# Patient Record
Sex: Female | Born: 1935
Health system: Southern US, Community
[De-identification: ages and names within clinical notes are randomized; demographics above are authoritative.]

## PROBLEM LIST (undated history)

## (undated) DIAGNOSIS — F0390 Unspecified dementia without behavioral disturbance: Secondary | ICD-10-CM

## (undated) DIAGNOSIS — T8859XA Other complications of anesthesia, initial encounter: Secondary | ICD-10-CM

## (undated) DIAGNOSIS — E7800 Pure hypercholesterolemia, unspecified: Secondary | ICD-10-CM

## (undated) DIAGNOSIS — R0609 Other forms of dyspnea: Secondary | ICD-10-CM

## (undated) DIAGNOSIS — N200 Calculus of kidney: Secondary | ICD-10-CM

## (undated) DIAGNOSIS — R011 Cardiac murmur, unspecified: Secondary | ICD-10-CM

## (undated) DIAGNOSIS — C319 Malignant neoplasm of accessory sinus, unspecified: Secondary | ICD-10-CM

## (undated) DIAGNOSIS — R413 Other amnesia: Secondary | ICD-10-CM

## (undated) DIAGNOSIS — I639 Cerebral infarction, unspecified: Secondary | ICD-10-CM

## (undated) DIAGNOSIS — T4145XA Adverse effect of unspecified anesthetic, initial encounter: Secondary | ICD-10-CM

## (undated) DIAGNOSIS — R0600 Dyspnea, unspecified: Secondary | ICD-10-CM

## (undated) DIAGNOSIS — G2581 Restless legs syndrome: Secondary | ICD-10-CM

## (undated) DIAGNOSIS — M1990 Unspecified osteoarthritis, unspecified site: Secondary | ICD-10-CM

## (undated) DIAGNOSIS — I10 Essential (primary) hypertension: Secondary | ICD-10-CM

## (undated) DIAGNOSIS — M797 Fibromyalgia: Secondary | ICD-10-CM

## (undated) DIAGNOSIS — I209 Angina pectoris, unspecified: Secondary | ICD-10-CM

## (undated) DIAGNOSIS — G459 Transient cerebral ischemic attack, unspecified: Secondary | ICD-10-CM

## (undated) DIAGNOSIS — K219 Gastro-esophageal reflux disease without esophagitis: Secondary | ICD-10-CM

## (undated) DIAGNOSIS — R079 Chest pain, unspecified: Secondary | ICD-10-CM

## (undated) DIAGNOSIS — F419 Anxiety disorder, unspecified: Secondary | ICD-10-CM

## (undated) DIAGNOSIS — I739 Peripheral vascular disease, unspecified: Secondary | ICD-10-CM

## (undated) HISTORY — PX: VAGINAL HYSTERECTOMY: SUR661

## (undated) HISTORY — PX: LUNG SURGERY: SHX703

## (undated) HISTORY — PX: CATARACT EXTRACTION W/ INTRAOCULAR LENS  IMPLANT, BILATERAL: SHX1307

## (undated) HISTORY — PX: BREAST CYST EXCISION: SHX579

## (undated) HISTORY — PX: KNEE CARTILAGE SURGERY: SHX688

## (undated) HISTORY — PX: CARPAL TUNNEL RELEASE: SHX101

## (undated) HISTORY — DX: Peripheral vascular disease, unspecified: I73.9

## (undated) HISTORY — PX: LUMBAR SPINE SURGERY: SHX701

## (undated) HISTORY — PX: KIDNEY STONE SURGERY: SHX686

## (undated) HISTORY — DX: Chest pain, unspecified: R07.9

## (undated) HISTORY — DX: Essential (primary) hypertension: I10

## (undated) HISTORY — PX: ELBOW BURSA SURGERY: SHX615

## (undated) NOTE — *Deleted (*Deleted)
Patient resting in bed without signs of distress throughout the day.

---
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
## 1997-10-27 ENCOUNTER — Other Ambulatory Visit: Admission: RE | Admit: 1997-10-27 | Discharge: 1997-10-27 | Payer: Self-pay | Admitting: Obstetrics and Gynecology

## 1998-11-17 ENCOUNTER — Other Ambulatory Visit: Admission: RE | Admit: 1998-11-17 | Discharge: 1998-11-17 | Payer: Self-pay | Admitting: Orthopedic Surgery

## 1998-12-05 ENCOUNTER — Encounter: Payer: Self-pay | Admitting: Emergency Medicine

## 1998-12-05 ENCOUNTER — Emergency Department (HOSPITAL_COMMUNITY): Admission: EM | Admit: 1998-12-05 | Discharge: 1998-12-05 | Payer: Self-pay | Admitting: Emergency Medicine

## 1998-12-16 ENCOUNTER — Other Ambulatory Visit: Admission: RE | Admit: 1998-12-16 | Discharge: 1998-12-16 | Payer: Self-pay | Admitting: Obstetrics and Gynecology

## 1999-08-18 ENCOUNTER — Emergency Department (HOSPITAL_COMMUNITY): Admission: EM | Admit: 1999-08-18 | Discharge: 1999-08-18 | Payer: Self-pay

## 1999-08-18 ENCOUNTER — Encounter: Payer: Self-pay | Admitting: Emergency Medicine

## 1999-11-05 ENCOUNTER — Ambulatory Visit (HOSPITAL_COMMUNITY): Admission: RE | Admit: 1999-11-05 | Discharge: 1999-11-05 | Payer: Self-pay | Admitting: Gastroenterology

## 1999-11-05 ENCOUNTER — Encounter: Payer: Self-pay | Admitting: Gastroenterology

## 2000-03-06 ENCOUNTER — Other Ambulatory Visit: Admission: RE | Admit: 2000-03-06 | Discharge: 2000-03-06 | Payer: Self-pay | Admitting: Obstetrics and Gynecology

## 2001-01-12 ENCOUNTER — Encounter: Admission: RE | Admit: 2001-01-12 | Discharge: 2001-01-12 | Payer: Self-pay | Admitting: Obstetrics and Gynecology

## 2001-01-12 ENCOUNTER — Encounter: Payer: Self-pay | Admitting: Obstetrics and Gynecology

## 2001-01-17 ENCOUNTER — Other Ambulatory Visit: Admission: RE | Admit: 2001-01-17 | Discharge: 2001-01-17 | Payer: Self-pay | Admitting: Obstetrics and Gynecology

## 2001-08-17 ENCOUNTER — Encounter: Payer: Self-pay | Admitting: Otolaryngology

## 2001-08-17 ENCOUNTER — Encounter: Admission: RE | Admit: 2001-08-17 | Discharge: 2001-08-17 | Payer: Self-pay | Admitting: Otolaryngology

## 2001-08-27 ENCOUNTER — Encounter (INDEPENDENT_AMBULATORY_CARE_PROVIDER_SITE_OTHER): Payer: Self-pay

## 2001-08-28 ENCOUNTER — Ambulatory Visit: Admission: RE | Admit: 2001-08-28 | Discharge: 2001-11-26 | Payer: Self-pay | Admitting: Radiation Oncology

## 2001-08-30 ENCOUNTER — Ambulatory Visit (HOSPITAL_COMMUNITY): Admission: RE | Admit: 2001-08-30 | Discharge: 2001-08-30 | Payer: Self-pay | Admitting: Oncology

## 2001-08-30 ENCOUNTER — Encounter: Payer: Self-pay | Admitting: Oncology

## 2001-08-31 ENCOUNTER — Ambulatory Visit (HOSPITAL_COMMUNITY): Admission: RE | Admit: 2001-08-31 | Discharge: 2001-08-31 | Payer: Self-pay | Admitting: Oncology

## 2001-08-31 ENCOUNTER — Encounter: Payer: Self-pay | Admitting: Oncology

## 2001-09-30 ENCOUNTER — Encounter: Payer: Self-pay | Admitting: Emergency Medicine

## 2001-10-01 ENCOUNTER — Inpatient Hospital Stay (HOSPITAL_COMMUNITY): Admission: EM | Admit: 2001-10-01 | Discharge: 2001-10-04 | Payer: Self-pay | Admitting: Emergency Medicine

## 2001-10-01 ENCOUNTER — Encounter: Payer: Self-pay | Admitting: Internal Medicine

## 2001-10-19 ENCOUNTER — Encounter: Payer: Self-pay | Admitting: Oncology

## 2001-10-19 ENCOUNTER — Inpatient Hospital Stay (HOSPITAL_COMMUNITY): Admission: EM | Admit: 2001-10-19 | Discharge: 2001-10-24 | Payer: Self-pay | Admitting: Oncology

## 2001-10-19 ENCOUNTER — Ambulatory Visit (HOSPITAL_COMMUNITY): Admission: RE | Admit: 2001-10-19 | Discharge: 2001-10-19 | Payer: Self-pay | Admitting: Oncology

## 2001-12-07 ENCOUNTER — Ambulatory Visit: Admission: RE | Admit: 2001-12-07 | Discharge: 2002-02-11 | Payer: Self-pay | Admitting: Radiation Oncology

## 2001-12-13 ENCOUNTER — Encounter (HOSPITAL_COMMUNITY): Admission: RE | Admit: 2001-12-13 | Discharge: 2002-03-13 | Payer: Self-pay | Admitting: Dentistry

## 2001-12-13 ENCOUNTER — Encounter (HOSPITAL_COMMUNITY): Payer: Self-pay | Admitting: Dentistry

## 2001-12-28 ENCOUNTER — Ambulatory Visit: Admission: RE | Admit: 2001-12-28 | Discharge: 2001-12-28 | Payer: Self-pay | Admitting: Radiation Oncology

## 2002-03-26 ENCOUNTER — Ambulatory Visit: Admission: RE | Admit: 2002-03-26 | Discharge: 2002-03-26 | Payer: Self-pay | Admitting: Radiation Oncology

## 2002-04-02 ENCOUNTER — Ambulatory Visit: Admission: RE | Admit: 2002-04-02 | Discharge: 2002-04-02 | Payer: Self-pay | Admitting: Radiation Oncology

## 2002-04-15 ENCOUNTER — Ambulatory Visit (HOSPITAL_COMMUNITY): Admission: RE | Admit: 2002-04-15 | Discharge: 2002-04-15 | Payer: Self-pay | Admitting: Oncology

## 2002-04-15 ENCOUNTER — Encounter: Payer: Self-pay | Admitting: Oncology

## 2002-08-13 ENCOUNTER — Encounter: Payer: Self-pay | Admitting: Oncology

## 2002-08-13 ENCOUNTER — Ambulatory Visit (HOSPITAL_COMMUNITY): Admission: RE | Admit: 2002-08-13 | Discharge: 2002-08-13 | Payer: Self-pay | Admitting: Oncology

## 2002-12-11 ENCOUNTER — Ambulatory Visit (HOSPITAL_COMMUNITY): Admission: RE | Admit: 2002-12-11 | Discharge: 2002-12-11 | Payer: Self-pay | Admitting: Oncology

## 2002-12-11 ENCOUNTER — Encounter: Payer: Self-pay | Admitting: Oncology

## 2003-02-27 ENCOUNTER — Emergency Department (HOSPITAL_COMMUNITY): Admission: EM | Admit: 2003-02-27 | Discharge: 2003-02-27 | Payer: Self-pay | Admitting: Emergency Medicine

## 2003-03-22 HISTORY — PX: CHOLECYSTECTOMY: SHX55

## 2003-05-12 ENCOUNTER — Ambulatory Visit (HOSPITAL_COMMUNITY): Admission: RE | Admit: 2003-05-12 | Discharge: 2003-05-12 | Payer: Self-pay | Admitting: Oncology

## 2003-06-11 ENCOUNTER — Other Ambulatory Visit: Admission: RE | Admit: 2003-06-11 | Discharge: 2003-06-11 | Payer: Self-pay | Admitting: Gynecology

## 2003-08-18 ENCOUNTER — Observation Stay (HOSPITAL_COMMUNITY): Admission: EM | Admit: 2003-08-18 | Discharge: 2003-08-19 | Payer: Self-pay | Admitting: Emergency Medicine

## 2003-09-08 ENCOUNTER — Ambulatory Visit (HOSPITAL_COMMUNITY): Admission: RE | Admit: 2003-09-08 | Discharge: 2003-09-08 | Payer: Self-pay | Admitting: Oncology

## 2003-09-09 ENCOUNTER — Ambulatory Visit (HOSPITAL_COMMUNITY): Admission: RE | Admit: 2003-09-09 | Discharge: 2003-09-10 | Payer: Self-pay | Admitting: General Surgery

## 2003-09-09 ENCOUNTER — Encounter (INDEPENDENT_AMBULATORY_CARE_PROVIDER_SITE_OTHER): Payer: Self-pay | Admitting: *Deleted

## 2004-03-31 ENCOUNTER — Ambulatory Visit: Payer: Self-pay | Admitting: Oncology

## 2004-04-02 ENCOUNTER — Ambulatory Visit (HOSPITAL_COMMUNITY): Admission: RE | Admit: 2004-04-02 | Discharge: 2004-04-02 | Payer: Self-pay | Admitting: Oncology

## 2004-09-29 ENCOUNTER — Ambulatory Visit: Payer: Self-pay | Admitting: Oncology

## 2005-03-21 HISTORY — PX: HERNIA REPAIR: SHX51

## 2005-07-19 ENCOUNTER — Ambulatory Visit (HOSPITAL_COMMUNITY): Admission: RE | Admit: 2005-07-19 | Discharge: 2005-07-20 | Payer: Self-pay | Admitting: General Surgery

## 2005-08-29 ENCOUNTER — Encounter: Admission: RE | Admit: 2005-08-29 | Discharge: 2005-08-29 | Payer: Self-pay | Admitting: Family Medicine

## 2005-09-16 ENCOUNTER — Encounter: Admission: RE | Admit: 2005-09-16 | Discharge: 2005-09-16 | Payer: Self-pay | Admitting: Orthopedic Surgery

## 2005-09-28 ENCOUNTER — Encounter: Admission: RE | Admit: 2005-09-28 | Discharge: 2005-09-28 | Payer: Self-pay | Admitting: Orthopedic Surgery

## 2005-10-10 ENCOUNTER — Encounter: Admission: RE | Admit: 2005-10-10 | Discharge: 2005-10-10 | Payer: Self-pay | Admitting: Orthopedic Surgery

## 2005-12-30 ENCOUNTER — Encounter: Admission: RE | Admit: 2005-12-30 | Discharge: 2005-12-30 | Payer: Self-pay | Admitting: Orthopedic Surgery

## 2006-01-11 ENCOUNTER — Emergency Department (HOSPITAL_COMMUNITY): Admission: EM | Admit: 2006-01-11 | Discharge: 2006-01-11 | Payer: Self-pay | Admitting: Family Medicine

## 2006-01-27 ENCOUNTER — Ambulatory Visit (HOSPITAL_COMMUNITY): Admission: RE | Admit: 2006-01-27 | Discharge: 2006-01-27 | Payer: Self-pay | Admitting: Neurosurgery

## 2006-02-20 ENCOUNTER — Inpatient Hospital Stay (HOSPITAL_COMMUNITY): Admission: RE | Admit: 2006-02-20 | Discharge: 2006-03-01 | Payer: Self-pay | Admitting: Neurosurgery

## 2006-02-28 ENCOUNTER — Ambulatory Visit: Payer: Self-pay | Admitting: Physical Medicine & Rehabilitation

## 2006-06-07 ENCOUNTER — Emergency Department (HOSPITAL_COMMUNITY): Admission: EM | Admit: 2006-06-07 | Discharge: 2006-06-07 | Payer: Self-pay | Admitting: Family Medicine

## 2006-06-13 ENCOUNTER — Encounter: Admission: RE | Admit: 2006-06-13 | Discharge: 2006-06-13 | Payer: Self-pay | Admitting: Orthopedic Surgery

## 2006-09-04 ENCOUNTER — Ambulatory Visit: Payer: Self-pay | Admitting: Oncology

## 2006-09-05 LAB — CBC WITH DIFFERENTIAL/PLATELET
Basophils Absolute: 0 10*3/uL (ref 0.0–0.1)
Eosinophils Absolute: 0.2 10*3/uL (ref 0.0–0.5)
HCT: 33.4 % — ABNORMAL LOW (ref 34.8–46.6)
HGB: 11.6 g/dL (ref 11.6–15.9)
MCV: 90.1 fL (ref 81.0–101.0)
MONO%: 8.6 % (ref 0.0–13.0)
NEUT#: 4.5 10*3/uL (ref 1.5–6.5)
RDW: 13.7 % (ref 11.3–14.5)
lymph#: 1.5 10*3/uL (ref 0.9–3.3)

## 2006-09-06 LAB — LACTATE DEHYDROGENASE: LDH: 157 U/L (ref 94–250)

## 2006-09-06 LAB — COMPREHENSIVE METABOLIC PANEL
ALT: 11 U/L (ref 0–35)
CO2: 27 mEq/L (ref 19–32)
Calcium: 9.6 mg/dL (ref 8.4–10.5)
Chloride: 99 mEq/L (ref 96–112)
Sodium: 137 mEq/L (ref 135–145)
Total Protein: 7.4 g/dL (ref 6.0–8.3)

## 2006-09-06 LAB — BETA 2 MICROGLOBULIN, SERUM: Beta-2 Microglobulin: 1.89 mg/L — ABNORMAL HIGH (ref 1.01–1.73)

## 2006-09-08 ENCOUNTER — Ambulatory Visit (HOSPITAL_COMMUNITY): Admission: RE | Admit: 2006-09-08 | Discharge: 2006-09-08 | Payer: Self-pay | Admitting: Oncology

## 2006-09-29 LAB — PROTEIN ELECTROPHORESIS, SERUM: Gamma Globulin: 14.9 % (ref 11.1–18.8)

## 2006-12-10 ENCOUNTER — Emergency Department (HOSPITAL_COMMUNITY): Admission: EM | Admit: 2006-12-10 | Discharge: 2006-12-10 | Payer: Self-pay | Admitting: Emergency Medicine

## 2006-12-19 ENCOUNTER — Encounter: Admission: RE | Admit: 2006-12-19 | Discharge: 2007-01-12 | Payer: Self-pay | Admitting: Orthopedic Surgery

## 2007-02-06 ENCOUNTER — Other Ambulatory Visit: Admission: RE | Admit: 2007-02-06 | Discharge: 2007-02-06 | Payer: Self-pay | Admitting: Gynecology

## 2007-04-16 ENCOUNTER — Emergency Department (HOSPITAL_COMMUNITY): Admission: EM | Admit: 2007-04-16 | Discharge: 2007-04-16 | Payer: Self-pay | Admitting: Family Medicine

## 2007-09-14 ENCOUNTER — Ambulatory Visit: Payer: Self-pay | Admitting: Oncology

## 2007-10-19 LAB — CBC WITH DIFFERENTIAL/PLATELET
BASO%: 0.3 % (ref 0.0–2.0)
Basophils Absolute: 0 10*3/uL (ref 0.0–0.1)
EOS%: 0 % (ref 0.0–7.0)
HCT: 36.2 % (ref 34.8–46.6)
HGB: 12.6 g/dL (ref 11.6–15.9)
LYMPH%: 12.5 % — ABNORMAL LOW (ref 14.0–48.0)
MCH: 32.2 pg (ref 26.0–34.0)
MCHC: 34.7 g/dL (ref 32.0–36.0)
MCV: 92.8 fL (ref 81.0–101.0)
MONO%: 0.5 % (ref 0.0–13.0)
NEUT%: 86.7 % — ABNORMAL HIGH (ref 39.6–76.8)

## 2007-10-22 ENCOUNTER — Ambulatory Visit (HOSPITAL_COMMUNITY): Admission: RE | Admit: 2007-10-22 | Discharge: 2007-10-22 | Payer: Self-pay | Admitting: Oncology

## 2007-10-22 ENCOUNTER — Encounter: Admission: RE | Admit: 2007-10-22 | Discharge: 2007-10-22 | Payer: Self-pay | Admitting: Oncology

## 2007-10-23 LAB — PROTEIN ELECTROPHORESIS, SERUM
Albumin ELP: 59 % (ref 55.8–66.1)
Alpha-1-Globulin: 3.9 % (ref 2.9–4.9)
Beta Globulin: 6 % (ref 4.7–7.2)
Total Protein, Serum Electrophoresis: 8.1 g/dL (ref 6.0–8.3)

## 2007-10-23 LAB — COMPREHENSIVE METABOLIC PANEL
Albumin: 4.8 g/dL (ref 3.5–5.2)
Alkaline Phosphatase: 89 U/L (ref 39–117)
BUN: 19 mg/dL (ref 6–23)
Creatinine, Ser: 1.26 mg/dL — ABNORMAL HIGH (ref 0.40–1.20)
Glucose, Bld: 173 mg/dL — ABNORMAL HIGH (ref 70–99)
Potassium: 4.8 mEq/L (ref 3.5–5.3)
Total Bilirubin: 0.5 mg/dL (ref 0.3–1.2)

## 2007-10-24 ENCOUNTER — Ambulatory Visit (HOSPITAL_COMMUNITY): Admission: RE | Admit: 2007-10-24 | Discharge: 2007-10-24 | Payer: Self-pay | Admitting: Oncology

## 2008-11-02 ENCOUNTER — Emergency Department (HOSPITAL_COMMUNITY): Admission: EM | Admit: 2008-11-02 | Discharge: 2008-11-02 | Payer: Self-pay | Admitting: Emergency Medicine

## 2008-12-23 ENCOUNTER — Emergency Department (HOSPITAL_COMMUNITY): Admission: EM | Admit: 2008-12-23 | Discharge: 2008-12-23 | Payer: Self-pay | Admitting: Family Medicine

## 2009-01-28 ENCOUNTER — Emergency Department (HOSPITAL_COMMUNITY): Admission: EM | Admit: 2009-01-28 | Discharge: 2009-01-28 | Payer: Self-pay | Admitting: Family Medicine

## 2009-01-28 ENCOUNTER — Inpatient Hospital Stay (HOSPITAL_COMMUNITY): Admission: EM | Admit: 2009-01-28 | Discharge: 2009-01-30 | Payer: Self-pay | Admitting: Emergency Medicine

## 2009-01-29 ENCOUNTER — Ambulatory Visit: Payer: Self-pay | Admitting: Surgery

## 2009-01-29 ENCOUNTER — Encounter (INDEPENDENT_AMBULATORY_CARE_PROVIDER_SITE_OTHER): Payer: Self-pay | Admitting: Internal Medicine

## 2009-05-11 ENCOUNTER — Encounter: Admission: RE | Admit: 2009-05-11 | Discharge: 2009-05-11 | Payer: Self-pay | Admitting: Internal Medicine

## 2009-06-01 ENCOUNTER — Inpatient Hospital Stay (HOSPITAL_COMMUNITY): Admission: EM | Admit: 2009-06-01 | Discharge: 2009-06-04 | Payer: Self-pay | Admitting: Emergency Medicine

## 2009-06-02 ENCOUNTER — Encounter (INDEPENDENT_AMBULATORY_CARE_PROVIDER_SITE_OTHER): Payer: Self-pay | Admitting: Internal Medicine

## 2009-12-13 ENCOUNTER — Emergency Department (HOSPITAL_COMMUNITY): Admission: EM | Admit: 2009-12-13 | Discharge: 2009-12-13 | Payer: Self-pay | Admitting: Emergency Medicine

## 2009-12-13 ENCOUNTER — Emergency Department (HOSPITAL_COMMUNITY): Admission: EM | Admit: 2009-12-13 | Discharge: 2009-12-13 | Payer: Self-pay | Admitting: Family Medicine

## 2009-12-14 ENCOUNTER — Encounter: Admission: RE | Admit: 2009-12-14 | Discharge: 2009-12-14 | Payer: Self-pay | Admitting: Orthopedic Surgery

## 2010-01-01 ENCOUNTER — Inpatient Hospital Stay (HOSPITAL_COMMUNITY): Admission: EM | Admit: 2010-01-01 | Discharge: 2010-01-03 | Payer: Self-pay | Admitting: Emergency Medicine

## 2010-01-02 ENCOUNTER — Encounter (INDEPENDENT_AMBULATORY_CARE_PROVIDER_SITE_OTHER): Payer: Self-pay | Admitting: Internal Medicine

## 2010-01-02 ENCOUNTER — Ambulatory Visit: Payer: Self-pay | Admitting: Vascular Surgery

## 2010-01-09 ENCOUNTER — Emergency Department (HOSPITAL_COMMUNITY): Admission: EM | Admit: 2010-01-09 | Discharge: 2010-01-09 | Payer: Self-pay | Admitting: Emergency Medicine

## 2010-04-10 ENCOUNTER — Encounter: Payer: Self-pay | Admitting: Oncology

## 2010-04-11 ENCOUNTER — Encounter: Payer: Self-pay | Admitting: Oncology

## 2010-04-12 ENCOUNTER — Encounter: Payer: Self-pay | Admitting: Oncology

## 2010-06-02 LAB — COMPREHENSIVE METABOLIC PANEL
ALT: 78 U/L — ABNORMAL HIGH (ref 0–35)
AST: 48 U/L — ABNORMAL HIGH (ref 0–37)
AST: 90 U/L — ABNORMAL HIGH (ref 0–37)
Albumin: 3.6 g/dL (ref 3.5–5.2)
BUN: 23 mg/dL (ref 6–23)
CO2: 27 mEq/L (ref 19–32)
Calcium: 9.2 mg/dL (ref 8.4–10.5)
Chloride: 101 mEq/L (ref 96–112)
Chloride: 102 mEq/L (ref 96–112)
Creatinine, Ser: 0.92 mg/dL (ref 0.4–1.2)
Creatinine, Ser: 1 mg/dL (ref 0.4–1.2)
GFR calc Af Amer: >60 mL/min (ref >60)
GFR calc Af Amer: >60 mL/min (ref >60)
GFR calc non Af Amer: 54 mL/min — ABNORMAL LOW (ref >60)
Glucose, Bld: 105 mg/dL — ABNORMAL HIGH (ref 70–99)
Sodium: 138 mEq/L (ref 135–145)
Total Bilirubin: 0.5 mg/dL (ref 0.3–1.2)
Total Bilirubin: 0.7 mg/dL (ref 0.3–1.2)
Total Protein: 6.7 g/dL (ref 6.0–8.3)

## 2010-06-02 LAB — URINALYSIS, ROUTINE W REFLEX MICROSCOPIC
Bilirubin Urine: NEGATIVE
Glucose, UA: NEGATIVE mg/dL
Hgb urine dipstick: NEGATIVE
Specific Gravity, Urine: 1.007 (ref 1.005–1.030)
pH: 5.5 (ref 5.0–8.0)

## 2010-06-02 LAB — DIFFERENTIAL
Basophils Absolute: 0.1 10*3/uL (ref 0.0–0.1)
Eosinophils Absolute: 0.1 10*3/uL (ref 0.0–0.7)
Eosinophils Relative: 1 % (ref 0–5)
Neutrophils Relative %: 79 % — ABNORMAL HIGH (ref 43–77)

## 2010-06-02 LAB — CBC
HCT: 35.5 % — ABNORMAL LOW (ref 36.0–46.0)
Hemoglobin: 11.2 g/dL — ABNORMAL LOW (ref 12.0–15.0)
Hemoglobin: 11.7 g/dL — ABNORMAL LOW (ref 12.0–15.0)
MCH: 30.4 pg (ref 26.0–34.0)
MCH: 31.2 pg (ref 26.0–34.0)
MCHC: 33 g/dL (ref 30.0–36.0)
MCV: 92.1 fL (ref 78.0–100.0)
RBC: 3.69 MIL/uL — ABNORMAL LOW (ref 3.87–5.11)
RBC: 3.75 MIL/uL — ABNORMAL LOW (ref 3.87–5.11)
WBC: 8 10*3/uL (ref 4.0–10.5)

## 2010-06-02 LAB — TROPONIN I: Troponin I: 0.01 ng/mL (ref 0.00–0.06)

## 2010-06-02 LAB — CARDIAC PANEL(CRET KIN+CKTOT+MB+TROPI): Relative Index: 1.4 (ref 0.0–2.5)

## 2010-06-02 LAB — URINE CULTURE
Colony Count: NO GROWTH
Culture  Setup Time: 201110141605
Culture: NO GROWTH

## 2010-06-02 LAB — LIPASE, BLOOD: Lipase: 21 U/L (ref 11–59)

## 2010-06-02 LAB — POCT CARDIAC MARKERS
Myoglobin, poc: 116 ng/mL (ref 12–200)
Troponin i, poc: <0.05 ng/mL (ref 0.00–0.09)

## 2010-06-02 LAB — CK TOTAL AND CKMB (NOT AT ARMC): Relative Index: 1.5 (ref 0.0–2.5)

## 2010-06-02 LAB — TSH: TSH: 3.951 u[IU]/mL (ref 0.350–4.500)

## 2010-06-02 LAB — MAGNESIUM: Magnesium: 2.1 mg/dL (ref 1.5–2.5)

## 2010-06-03 LAB — CBC
HCT: 35.9 % — ABNORMAL LOW (ref 36.0–46.0)
MCV: 93.5 fL (ref 78.0–100.0)
RDW: 13.1 % (ref 11.5–15.5)
WBC: 8.7 10*3/uL (ref 4.0–10.5)

## 2010-06-03 LAB — URINALYSIS, ROUTINE W REFLEX MICROSCOPIC
Glucose, UA: NEGATIVE mg/dL
Hgb urine dipstick: NEGATIVE
Ketones, ur: 15 mg/dL — AB
pH: 5.5 (ref 5.0–8.0)

## 2010-06-03 LAB — URINE CULTURE
Colony Count: >=100000
Culture  Setup Time: 201109252256

## 2010-06-03 LAB — URINE MICROSCOPIC-ADD ON

## 2010-06-03 LAB — BASIC METABOLIC PANEL
BUN: 16 mg/dL (ref 6–23)
Chloride: 106 mEq/L (ref 96–112)
Potassium: 4.5 mEq/L (ref 3.5–5.1)

## 2010-06-03 LAB — DIFFERENTIAL
Eosinophils Relative: 4 % (ref 0–5)
Lymphocytes Relative: 18 % (ref 12–46)
Lymphs Abs: 1.6 10*3/uL (ref 0.7–4.0)
Monocytes Absolute: 0.8 10*3/uL (ref 0.1–1.0)

## 2010-06-11 LAB — COMPREHENSIVE METABOLIC PANEL
ALT: 32 U/L (ref 0–35)
Albumin: 3.4 g/dL — ABNORMAL LOW (ref 3.5–5.2)
Alkaline Phosphatase: 69 U/L (ref 39–117)
BUN: 11 mg/dL (ref 6–23)
BUN: 19 mg/dL (ref 6–23)
CO2: 28 mEq/L (ref 19–32)
Chloride: 103 mEq/L (ref 96–112)
Chloride: 103 mEq/L (ref 96–112)
Creatinine, Ser: 0.93 mg/dL (ref 0.4–1.2)
Glucose, Bld: 104 mg/dL — ABNORMAL HIGH (ref 70–99)
Glucose, Bld: 113 mg/dL — ABNORMAL HIGH (ref 70–99)
Potassium: 3.5 mEq/L (ref 3.5–5.1)
Sodium: 139 mEq/L (ref 135–145)
Total Bilirubin: 0.4 mg/dL (ref 0.3–1.2)
Total Bilirubin: 0.6 mg/dL (ref 0.3–1.2)
Total Protein: 6.1 g/dL (ref 6.0–8.3)
Total Protein: 6.3 g/dL (ref 6.0–8.3)

## 2010-06-11 LAB — BASIC METABOLIC PANEL WITH GFR
BUN: 26 mg/dL — ABNORMAL HIGH (ref 6–23)
CO2: 26 meq/L (ref 19–32)
Calcium: 9.3 mg/dL (ref 8.4–10.5)
Chloride: 103 meq/L (ref 96–112)
Creatinine, Ser: 1.23 mg/dL — ABNORMAL HIGH (ref 0.4–1.2)

## 2010-06-11 LAB — BASIC METABOLIC PANEL
GFR calc Af Amer: 51 mL/min — ABNORMAL LOW (ref >60)
GFR calc non Af Amer: 42 mL/min — ABNORMAL LOW (ref >60)
Glucose, Bld: 119 mg/dL — ABNORMAL HIGH (ref 70–99)
Potassium: 4.4 mEq/L (ref 3.5–5.1)
Sodium: 138 mEq/L (ref 135–145)

## 2010-06-11 LAB — DIFFERENTIAL
Basophils Absolute: 0 10*3/uL (ref 0.0–0.1)
Basophils Absolute: 0.1 10*3/uL (ref 0.0–0.1)
Basophils Relative: 0 % (ref 0–1)
Basophils Relative: 1 % (ref 0–1)
Eosinophils Absolute: 0.1 10*3/uL (ref 0.0–0.7)
Eosinophils Relative: 1 % (ref 0–5)
Lymphocytes Relative: 19 % (ref 12–46)
Lymphocytes Relative: 9 % — ABNORMAL LOW (ref 12–46)
Lymphs Abs: 0.8 K/uL (ref 0.7–4.0)
Monocytes Absolute: 0.5 K/uL (ref 0.1–1.0)
Monocytes Absolute: 0.7 10*3/uL (ref 0.1–1.0)
Monocytes Relative: 6 % (ref 3–12)
Neutro Abs: 4.9 10*3/uL (ref 1.7–7.7)
Neutro Abs: 7.8 K/uL — ABNORMAL HIGH (ref 1.7–7.7)
Neutrophils Relative %: 69 % (ref 43–77)
Neutrophils Relative %: 84 % — ABNORMAL HIGH (ref 43–77)

## 2010-06-11 LAB — CBC
HCT: 30.9 % — ABNORMAL LOW (ref 36.0–46.0)
HCT: 31 % — ABNORMAL LOW (ref 36.0–46.0)
HCT: 33.9 % — ABNORMAL LOW (ref 36.0–46.0)
Hemoglobin: 10.8 g/dL — ABNORMAL LOW (ref 12.0–15.0)
Hemoglobin: 10.8 g/dL — ABNORMAL LOW (ref 12.0–15.0)
Hemoglobin: 11.7 g/dL — ABNORMAL LOW (ref 12.0–15.0)
MCHC: 34.6 g/dL (ref 30.0–36.0)
MCV: 94.1 fL (ref 78.0–100.0)
MCV: 94.6 fL (ref 78.0–100.0)
Platelets: 242 10*3/uL (ref 150–400)
Platelets: 273 10*3/uL (ref 150–400)
RBC: 3.28 MIL/uL — ABNORMAL LOW (ref 3.87–5.11)
RBC: 3.59 MIL/uL — ABNORMAL LOW (ref 3.87–5.11)
RDW: 13.4 % (ref 11.5–15.5)
RDW: 13.5 % (ref 11.5–15.5)
RDW: 13.6 % (ref 11.5–15.5)
WBC: 6.6 10*3/uL (ref 4.0–10.5)
WBC: 9.3 K/uL (ref 4.0–10.5)

## 2010-06-11 LAB — URINALYSIS, ROUTINE W REFLEX MICROSCOPIC
Bilirubin Urine: NEGATIVE
Glucose, UA: NEGATIVE mg/dL
Ketones, ur: NEGATIVE mg/dL
Leukocytes, UA: NEGATIVE
Nitrite: NEGATIVE
Protein, ur: NEGATIVE mg/dL
Specific Gravity, Urine: 1.022 (ref 1.005–1.030)
Urobilinogen, UA: 0.2 mg/dL (ref 0.0–1.0)
pH: 5.5 (ref 5.0–8.0)

## 2010-06-11 LAB — URINE MICROSCOPIC-ADD ON

## 2010-06-11 LAB — LIPID PANEL
Cholesterol: 166 mg/dL (ref 0–200)
HDL: 37 mg/dL — ABNORMAL LOW (ref >39)

## 2010-06-11 LAB — HEMOGLOBIN A1C: Mean Plasma Glucose: 120 mg/dL

## 2010-06-11 LAB — POCT CARDIAC MARKERS

## 2010-06-11 LAB — RAPID URINE DRUG SCREEN, HOSP PERFORMED
Amphetamines: NOT DETECTED
Barbiturates: NOT DETECTED
Benzodiazepines: NOT DETECTED
Cocaine: NOT DETECTED
Opiates: POSITIVE — AB
Tetrahydrocannabinol: NOT DETECTED

## 2010-06-11 LAB — VITAMIN B12: Vitamin B-12: 957 pg/mL — ABNORMAL HIGH (ref 211–911)

## 2010-06-11 LAB — TSH: TSH: 1.506 u[IU]/mL (ref 0.350–4.500)

## 2010-06-11 LAB — FOLATE RBC: RBC Folate: 615 ng/mL — ABNORMAL HIGH (ref 180–600)

## 2010-06-23 LAB — COMPREHENSIVE METABOLIC PANEL
AST: 45 U/L — ABNORMAL HIGH (ref 0–37)
Albumin: 3.8 g/dL (ref 3.5–5.2)
Calcium: 9.4 mg/dL (ref 8.4–10.5)
Creatinine, Ser: 0.97 mg/dL (ref 0.4–1.2)
GFR calc Af Amer: >60 mL/min (ref >60)
GFR calc non Af Amer: 56 mL/min — ABNORMAL LOW (ref >60)
Total Protein: 7.3 g/dL (ref 6.0–8.3)

## 2010-06-23 LAB — CBC
MCHC: 34.6 g/dL (ref 30.0–36.0)
MCV: 97.4 fL (ref 78.0–100.0)
MCV: 97.5 fL (ref 78.0–100.0)
Platelets: 277 10*3/uL (ref 150–400)
Platelets: 305 10*3/uL (ref 150–400)
RBC: 3.81 MIL/uL — ABNORMAL LOW (ref 3.87–5.11)
RDW: 12.8 % (ref 11.5–15.5)
WBC: 9.3 10*3/uL (ref 4.0–10.5)

## 2010-06-23 LAB — CULTURE, BLOOD (ROUTINE X 2)

## 2010-06-23 LAB — DIFFERENTIAL
Basophils Relative: 0 % (ref 0–1)
Eosinophils Absolute: 0.2 10*3/uL (ref 0.0–0.7)
Lymphs Abs: 1.4 10*3/uL (ref 0.7–4.0)
Monocytes Relative: 8 % (ref 3–12)
Neutro Abs: 6.9 10*3/uL (ref 1.7–7.7)
Neutrophils Relative %: 75 % (ref 43–77)

## 2010-06-23 LAB — LIPID PANEL
Cholesterol: 280 mg/dL — ABNORMAL HIGH (ref 0–200)
HDL: 42 mg/dL (ref >39)
Total CHOL/HDL Ratio: 6.7 RATIO
Triglycerides: 230 mg/dL — ABNORMAL HIGH (ref <150)

## 2010-06-23 LAB — BASIC METABOLIC PANEL
BUN: 19 mg/dL (ref 6–23)
CO2: 28 mEq/L (ref 19–32)
Chloride: 103 mEq/L (ref 96–112)
Glucose, Bld: 120 mg/dL — ABNORMAL HIGH (ref 70–99)
Potassium: 3.2 mEq/L — ABNORMAL LOW (ref 3.5–5.1)

## 2010-06-23 LAB — TSH: TSH: 3.535 u[IU]/mL (ref 0.350–4.500)

## 2010-06-23 LAB — POCT I-STAT, CHEM 8
Chloride: 102 mEq/L (ref 96–112)
Creatinine, Ser: 0.7 mg/dL (ref 0.4–1.2)
HCT: 38 % (ref 36.0–46.0)
Hemoglobin: 12.9 g/dL (ref 12.0–15.0)
Potassium: 3.8 mEq/L (ref 3.5–5.1)
Sodium: 137 mEq/L (ref 135–145)

## 2010-06-23 LAB — MAGNESIUM: Magnesium: 2 mg/dL (ref 1.5–2.5)

## 2010-06-26 LAB — URINE CULTURE
Colony Count: NO GROWTH
Culture: NO GROWTH

## 2010-06-26 LAB — POCT URINALYSIS DIP (DEVICE)
Ketones, ur: NEGATIVE mg/dL
Protein, ur: NEGATIVE mg/dL
Specific Gravity, Urine: <=1.005 (ref 1.005–1.030)

## 2010-06-26 LAB — BASIC METABOLIC PANEL
BUN: 15 mg/dL (ref 6–23)
Chloride: 99 mEq/L (ref 96–112)
Potassium: 3.8 mEq/L (ref 3.5–5.1)

## 2010-06-26 LAB — CBC
HCT: 38.7 % (ref 36.0–46.0)
MCV: 94.3 fL (ref 78.0–100.0)
Platelets: 308 10*3/uL (ref 150–400)
WBC: 7.7 10*3/uL (ref 4.0–10.5)

## 2010-08-06 NOTE — H&P (Signed)
Amanda Hogan, Amanda Hogan NO.:  [id number]   MEDICAL RECORD NO.:  [id number]          PATIENT TYPE:  INP   LOCATION:  3032                         FACILITY:  [facility]   PHYSICIAN:  Hewitt Shorts, M.D.DATE OF BIRTH:  [birth year]   DATE OF ADMISSION:  02/20/2006  DATE OF DISCHARGE:                              HISTORY & PHYSICAL   HISTORY:  The patient a 77 year old left-handed white female who was  evaluated for low back and left lumbar radicular pain and was found to  have extensive degenerative changes in the lumbar spine.   The patient states she has been having pain in the left side of the low  back radiating to the left buttock, left thigh, and lateral left leg for  the past 8-9 months, aggravated by walking.  She denies any right-sided  lower extremity radicular pain.  She does describe some aching of the  back, but the primary pain is that of radicular pain through the left  lower extremity.   The patient was evaluated with x-x-rays and an MRI scan.  She underwent  a series of three epidural sternum injections.  The first two injections  gave her relief for about four weeks, but the third injection only gave  her relief for a couple of days.   The patient was evaluated with x-rays and an MRI scan and subsequently  with a myelogram and post-myelogram CT scan.  The studies showed, on  MRI, degenerative disk disease  and spondylosis, worst at the L4-L5  level with minimal degenerative changes at other levels.  The L4-L5 has  a broad-based spondylitic disk protrusion with L4-L5 facet arthropathy,  and ligamentum flavum thickening, and canal and lateral recess stenosis.  Myelogram and post-myelogram CT scan show a dynamic degenerative  spondylolisthesis at L4 and L5 which worsens with flexion, reduces with  extension, with multifactorial spinal stenosis at L4-L5, left worse than  right.   I discussed options for the nonsurgical management; however, the  patient  wished to proceed with surgery and is admitted now for an L4-L5 lumbar  laminectomy, facetectomy, microdiskectomy, posterior lumbar interbody  fusion with interbody implants and bone grafts as well as posterolateral  arthrodesis with posterior instrumentation and bone graft.   PAST MEDICAL HISTORY:  Notable for a history of hypertension for 25  years, history of TIA on two occasions about 10 years ago.  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  No  history of myocardial infarction, peptic ulcer disease, or lung disease.  No history of diabetes.   PAST SURGICAL HISTORY:  1. Removal of breasts cysts, 1951.  2. Kidney stone removal 1961.  3. Hysterectomy, 1985.  4. Herniorrhaphy, June 2007.   ALLERGIES:  SHE REPORTS ALLERGIC REACTIONS TO IV CONTRAST AND NOVOCAIN.   MEDICATIONS:  1. Atenolol 50 mg daily.  2. Atacand 16 mg daily.  3. Nexium 30 mg daily.  4. Zoloft 25 mg daily.   FAMILY HISTORY:  Mother died at age 37; she had a stroke.  Father died  at age 47; he had heart  disease.   SOCIAL HISTORY:  The patient is married.  She is retired.  She does not  smoke or drink alcoholic beverages.  She does not describe history of  substance abuse.   REVIEW OF SYSTEMS:  Notable for those described in her history of  present illness and past medical history but a 14-point review of  systems is otherwise unremarkable.   PHYSICAL EXAMINATION:  GENERAL:  The patient is a well-developed, well-  nourished, white female.  VITAL SIGNS:  Her height is 5 foot 3 inches, weight 108 pounds,  temperature 97, pulse 64, blood pressure 207/90, respirations 16.  LUNGS:  Clear to auscultation.  She has symmetrical excursion on  respiration bilaterally.  HEART:  Regular rate and rhythm.  S1-S2 has no murmur.  ABDOMEN:  Soft, nondistended.  Normal bowel sounds present.  EXTREMITIES:  No clubbing, cyanosis, or edema.  MUSCULOSKELETAL:  Exam shows she can flex  to 90 degrees, she extended to  10 degrees.  She has more discomfort in extension.  Straight leg raising  is negative bilaterally.  NEUROLOGIC:  Examination shows 5/5 strength in the lower extremities, in  the iliopsoas, quadriceps, dorsiflexors, abductor longus, plantar  flexors.  Sensation is intact to pinprick of the upper and lower  extremities.  Reflexes are 1/5 in the biceps, brachial radialis,  triceps, 2 in the quadriceps, absent in the gastrocnemius, symmetrical  and equivocal bilaterally.  Toes are downgoing.  He has normal gait and  stance.   IMPRESSION:  The patient with low back and left lumbar radicular pain  with extensive degenerative and spondyltic changes at L4-5 with  significant broad-based disk protrusion resulting  in canal and lateral  recess stenosis.   PLAN:  The patient is admitted for an L4-L5 PLIF and PLA.  We discussed  the nature of the surgery and hospital stay at length as well as  recuperation, need for postoperartive immobilization in a lumbar corset,  and risks including infection, bleeding, possible need for transfusion,  the risk of nerve root dysfunction with pain, weakness, numbness, or  tingling, the risk of CSF leakage and the possible need for further  surgery, the risk of failure of the arthrodesis and the possible need  for further surgery, and anesthetic risks of myocardial infarction,  stroke, pneumonia, and death.  After discussing all this on several  occasions, she would like to go ahead with surgery and was admitted for  such.      Hewitt Shorts, M.D.  Electronically Signed     RWN/MEDQ  D:  02/20/2006  T:  02/20/2006  Job:  [id number]   cc:   Hewitt Shorts, M.D.

## 2010-08-06 NOTE — Op Note (Signed)
[place]  Patient:    Amanda Hogan, Amanda Hogan                      MRN: [id number] Proc. Date: 11/05/99 Adm. Date:  [id number] Disc. Date: [id number] Attending:  Rich Brave CC:         [name], M.D.   Operative Report  PROCEDURE:  Colonoscopy.  INDICATIONS:  Followup of small colonic adenomas removed nine years ago, for colon cancer screening, and polyp surveillance.  FINDINGS:  Sigmoid diverticulosis.  PROCEDURE IN DETAIL:  The nature, purpose, and risks of the procedure were familiar to the patient from prior examination.  She provided written consent. This procedure was performed immediately following her upper endoscopy for which she had received Fentanyl 50 mcg and Versed 5 mg IV without arrhythmias or desaturation.  The Olympus PCF 140-L pediatric video colonoscope was advanced quite easily to the cecum, using some external abdominal compression to facilitate advancement into the base of the cecum.  In the proximal ascending colon, there was some mucosal hemorrhage as might be seen from mucosal trauma from advancement of the scope through a difficult sigmoid region with traction on the colonic mesentery although, in this case, there was no particular difficulty in getting through the sigmoid region. There did not appear to be frank primary inflammation in this region.  The hemorrhage was in a vascular pattern and somewhat lacy intra-articular, and dissimilar to what I have seen periodically in other patients.  I elected not to biopsy it.  The cecum was identified by visualization of the appendiceal orifice and transillumination of the right lower quadrant.  The quality of the prep was excellent, and it was felt that all areas were well seen.  There was moderate sigmoid diverticulosis.  There was no evidence of polyps, cancer, primary colitis (mucosal hemorrhages were present as described above), or discrete vascular  malformations.  No biopsies were obtained.  The patient tolerated the procedure well.  There were no apparent complications.  IMPRESSION: 1.  Sigmoid diverticulosis. 2.  Mucosal hemorrhages in the proximal ascending colon, not felt     to be clinically significant.  PLAN:  Consider followup colonoscopy in five years for routine polyp surveillance. DD:  11/05/99 TD:  11/06/99 Job: [id number]

## 2010-08-06 NOTE — H&P (Signed)
[facility]  Patient:    Amanda Hogan, HUETHER Visit Number: [id number] MRN: [id number]          Service Type: MED Location: 3W [id number] Attending Physician:  Redmond Baseman Dictated by:   Redmond Baseman, M.D. Admit Date:  09/30/2001   CC:         [name], M.D.  [name], M.D.   History and Physical  DATE OF BIRTH:  Aug 18, 1928  The patient is admitted to my service on September 30, 2001, at 11 p.m.  PRIMARY CARE PHYSICIAN:  [name], M.D.  ONCOLOGIST:  [name], M.D.  CHIEF COMPLAINT:  Vomiting and explosive diarrhea.  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 ENT was Dr. Lucky Cowboy.  Biopsy was done, diagnosis lymphoma.  First chemotherapy course was given on September 04, 2001 to September 06, 2001, was okay for a few days, i.e., 3-4 days after chemotherapy.  Started with diarrhea four times at night at that time.  Then vomiting started one week ago, intermittent, and has been worse today with lots of vomiting but also a lot of regurgitation as well as diarrhea increased eight times while in the emergency department.  A little epigastric discomfort, cramping for the past week and a half.  No blood in the stools.  No melena.  No hematemesis.  The stools were a mustard color and watery.  And the vomit was also the same mostly ______ and watery.  She was evaluated in the ED and even though her lab work came back fairly good, it was felt that she was at risk to get volume depleted and dehydrated, and her diarrhea was poorly controlled and protracted vomiting. She was given Phenergan.  Her legs feel irritated, and she cannot keep still. Her legs have had this restless feeling, has been on and off for several years, not been treated.  No  fever.  Diarrhea has been explosive today even hit the walls of the bathroom at home according to the daughter, i.e., even hit the ceiling.  ALLERGIES:  IVP DYE.  CURRENT MEDICATIONS: 1. Chemotherapy, just started recently three weeks ago. 2. Nexium 20 mg q.d. 3. Atenolol 50 mg q.d. 4. Nifedipine not sure but may be 60 mg q.d. 5. Zoloft, dose unsure. 6. Prochlorperazine. 7. Maalox. 8. Valtrex x 3 days ago for blisters, prescription by Dr. Nicholas Lose. 9. Estring.  PAST MEDICAL HISTORY: 1. Lymphoma of the sinuses. 2. Hypertension. 3. History of coronary artery disease with history of angina recently. 4. Past history of pneumonia. 5. History of gallstones. 6. Past history of endometriosis. 7. History of kidney stones. 8. TIAs.  PAST SURGICAL HISTORY: 1. Partial hysterectomy for endometriosis, noncancer reason. 2. Laparotomy for removal of kidney stones. 3. Preventative care colonoscopy 2-3 years ago, performed by Dr. Matthias Hughs, was    negative.  SOCIAL HISTORY:  She is married.  Husband is still alive.   Five children alive.  No alcohol.  She never smoked.  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  FAMILY HISTORY:  Dad died of massive heart attack, had strokes as well, multiple.  Mom died of a heart attack as well as she had strokes.  The patient had four sisters.  One died of ovarian  cancer, one of a heart attack.  The other sister is in poor health.  One brother alive and in poor health.  REVIEW OF SYSTEMS:  CVS:  No angina or chest pain.  RESPIRATORY:  Negative. GI:  As above.  GU:  Has been urinating, has been okay.  CNS:  Intermittent restless legs at night, legs still going.  She has to keep moving.  No weakness in the legs or numbness, just a tingling and restless feeling.  PHYSICAL EXAMINATION:  GENERAL:  White female, fidgety, standing, and moving legs a lot.  No distress.  VITAL SIGNS:  Temperature 97.6, blood pressure 125/74, heart rate  76, respiratory rate 16.  ______ hydration ______ IV fluid, normal filling at 125 cc an hour.  HEENT:  Eyes clear.  Wears glasses.  Ears clear and throat.  Upper denture. Nose question of polypoid mass in the floor and roof of the right nostril.  NECK:  Supple.  NODES:  Negative.  LUNGS:  Clear.  CARDIOVASCULAR:  Regular, no murmurs.  ABDOMEN:  Obese, soft, distended.  Also was normal.  No liver, kidney, spleen felt.  It is nontender.  Several subumbilical midline scars.  PELVIC:  Deferred.  RECTAL:  Deferred.  NEUROLOGIC:  Awake, alert, oriented.  Reflexes 2+ bilaterally.  ______. Sensory to filament intact in lower legs.  EXTREMITIES:  No edema, 2+ dorsal pedis pulses.  LABORATORY DATA:  CBC showed a WBC of 11.3, hemoglobin 11.3, platelets 454, hematocrit 32.7.  CMET showed sodium 133 slightly low, glucose 119 slightly high.  The rest of the CMET was totally normal.  Urinalysis was negative.  Abdominal x-ray series was essentially negative.  ASSESSMENT: 1. Intractable nausea, vomiting, and diarrhea, post first dose of    chemotherapy, more severe today with explosive diarrhea, vomiting, and    regurgitation.  At risk for dehydration and volume depletion. 2. Lymphoma of the sinuses, presently being treated with chemotherapy. 3. Hypertension. 4. Obesity. 5. Restless leg syndrome. 6. Gastroesophageal reflux disease. 7. Coronary artery disease. 8. History of gallstones. 9. History of transient ischemic attacks.  Oncologist on call was informed via emergency department.  The P.A. on call. The case was presented for oncology to evaluate.  ______ request medical oncologist for primary care to do the admission.  PLAN:  1. Will admit, private room, condition stable.  2. Vital signs routinely.  3. Activities with assistance.  3. Bedside commode.   4. Nothing per mouth except for ice chips, lemon sticks, and medications.  5. IV D-5 half-normal saline plus  20 mEq of  potassium chloride at 150 cc/hr.  6. Strict ins and outs, daily weights.  7. A liter of normal saline was already given.  8. Continue nifedipine, atenolol, and Zoloft.  9. Klonopin 0.5 mg q.h.s. p.r.n. restless legs. 10. Phenergan 12.5 mg IV q.4-6h. p.r.n. vomiting and nausea. 11. Labs in the a.m., CBC, CMET. 12. Further adjustment to treatment will be made as necessary. 13. Will also add a proton pump inhibitor IV. Dictated by:   Redmond Baseman, M.D. Attending Physician:  Redmond Baseman DD:  10/01/01 TD:  10/02/01 Job: [id number]

## 2010-08-06 NOTE — Discharge Summary (Signed)
[facility]  Patient:    Amanda Hogan, Amanda Hogan Visit Number: [id number] MRN: [id number]          Service Type: MED Location: 3W [id number] Attending Physician:  Gracelyn Nurse Dictated by:   Gracelyn Nurse, M.D. Admit Date:  09/30/2001 Discharge Date: 10/04/2001   CC:         [name], M.D.  [name], M.D.   Discharge Summary  DISCHARGE DIAGNOSES: 1. Nausea, vomiting, and diarrhea.    a. Felt to be secondary to chemotherapy.    b. Clostridium difficile negative. 2. Sinus lymphoma.    a. Status post one cycle of chemotherapy. 3. Mild dehydration. 4. Gastroesophageal reflux disease. 5. Hypertension. 6. History of angina. 7. History of kidney stones. 8. Genital herpes treated as an outpatient.  DISCHARGE MEDICATIONS: 1. Nexium 20 mg q.d. 2. Atenolol 50 mg q.d. 3. Nifedipine 60 mg q.d. 4. Zoloft 25 mg q.d. 5. Phenergan 25 mg q.4h. p.r.n. 6. Valtrex 100 mg t.i.d. x 3 days.  HISTORY OF PRESENT ILLNESS:  This is a 77 year old white female who just recently underwent her first cycle of chemotherapy for sinus lymphoma.  She started having some nausea and vomiting about one week ago.  Today it was much worse.  She started developing diarrhea.  HOSPITAL COURSE: #1 - NAUSEA, VOMITING, AND DIARRHEA:  The patient was given antiemetics. Stool was tested for Clostridium difficile which was negative.  White blood cells were also negative.  She was given Imodium for the diarrhea.  She was started on a clear liquid diet which she tolerated.  Diet was advanced on hospital day #2, but she had an episode of vomiting, so she was changed back to a full liquid diet, which she tolerated well.  She was hydrated with IV fluids.  On the day of discharge, she was tolerating a soft diet and wanted to go home on outpatient management.  #2 - SINUS LYMPHOMA:  As stated above, she is currently undergoing chemotherapy per [name], M.D.  The second cycle of  chemotherapy was scheduled the time of discharge.  This can be postponed to the following Monday.  #3 - HYPERTENSION:  This was controlled with home medications.  DISCHARGE LABORATORY DATA:  The hemoglobin was 10.8, white blood cell count 5.3, and platelets 411.  DISPOSITION:  The patient is discharged in stable condition.  DIET:  She is advised to continue a soft diet and advance as tolerated and to try to drink plenty of liquids.  FOLLOW-UP:  To return to her primary care doctor or this hospital if she has return of symptoms.  She is also supposed to follow up with [name], M.D., next Monday to continue her chemotherapy. Dictated by:   Gracelyn Nurse, M.D. Attending Physician:  Marcelino Duster D DD:  10/04/01 TD:  10/10/01 Job: [id number]

## 2010-08-06 NOTE — Op Note (Signed)
NAMEMYRAH, STRAWDERMAN NO.:  [id number]   MEDICAL RECORD NO.:  [id number]          PATIENT TYPE:  INP   LOCATION:  3032                         FACILITY:  [facility]   PHYSICIAN:  Hewitt Shorts, M.D.DATE OF BIRTH:  Mar 02, 1929   DATE OF PROCEDURE:  02/20/2006  DATE OF DISCHARGE:                               OPERATIVE REPORT   PREOPERATIVE DIAGNOSES:  L4-5 dynamic degenerative spondylolisthesis,  spondylytic disk herniation, degenerative disk disease with spondylosis.   POSTOPERATIVE DIAGNOSES:  L4-5 dynamic degenerative spondylolisthesis,  spondylytic disk herniation, degenerative disk disease with spondylosis.   PROCEDURES:  Bilateral L4-5 lumbar laminotomy, facetectomy,  microdiskectomy and posterior lumbar interbody fusion with AVS PEEK  interbody implants  with Vitoss with bone marrow aspirate, and bilateral  L4-5 posterolateral arthrodesis with 90D posterior instrumentation and  Vitoss with bone marrow aspirate and Infuse and microdissection.   SURGEON:  Hewitt Shorts, M.D.   ASSISTANT:  Cristi Loron, M.D.   ANESTHESIA:  General endotracheal.   OPERATIVE INDICATIONS:  The patient is a 77 year old woman, who  presented with low back and left lumbar radiculopathy.  She was  evaluated with MRI scan and myelogram, post-myelogram CT scan and x-  rays, and was found to have a dynamic degenerative spondylolisthesis at  L4-5, with associated spondylytic disk herniation, worse on the left  than on the right, with associated underlying degenerative disk disease  and spondylosis.  The decision was made to proceed with decompression  with arthrodesis.   DESCRIPTION OF PROCEDURES:  The patient was brought to the operating  room and placed under general endotracheal anesthesia.  The patient was  turned to a prone position.  The lumbar region was prepped with Betadine  Soap and Solution and draped in sterile fashion.  The midline was  infiltrated with  local anesthetic with epinephrine.  An x-ray was taken  and the L4-5 level identified, and the midline incision made over the L4-  5 level and carried down through the subcutaneous tissue.  Bipolar  cautery and electrocautery were used to maintain hemostasis.  Dissection  was carried down to the lumbar fascia, which was incised bilaterally,  and the paraspinal muscles were dissected off the spinous process and  lamina in subperiosteal fashion.  Another x-ray was taken and the L4-5  intralaminar space was identified, and then dissection was first carried  out laterally, exposing the transverse processes of L4 and L5 and the  intertransverse space.  Each of the facet complexes were arthropathic  and the microscope was draped and brought into the field to provide  additional magnification, illumination and visualization, and a  decompression was performed using microdissection and microsurgical  technique.  Bilateral laminotomy and facetectomy were performed using  the X-Max drill and Kerrison punches, as well as double-action rongeurs.  The ligamentum flavum was carefully removed, and we exposed the thecal  sac and identified the L4 and L5 nerve roots bilaterally.  We then  coagulated and divided epidural veins and exposed the L4-5 annulus.  The  disk herniation was noted and we proceeded with bilateral diskectomy,  incising the  annulus and continuing with microcurets and pituitary  rongeurs.  The osteophyte removal tool was used to remove additional  spondylytic disk protrusion and spurring from the posterior aspects of  the L4 and L5 vertebra, and the disk space was prepared for interbody  arthrodesis.  The cartilaginous endplates were removed using  microcurets, and in the end, good decompression of the thecal sac and  nerve roots was achieved bilaterally, and the disk space was measured  for its height.  We selected an 11-mm implant for the right side and a  12-mm implant for the left  side.  Each of the implants was 8 mm wide and  0 degrees of lordosis.  We then went ahead and probed the L5 pedicles  bilaterally and aspirated 10 cc of bone marrow aspirate.  This was  injected over a 10-cc strip of Vitoss, and then Vitoss was packed into  the AVS PEEK interbody implants and we placed a 12-mm implant on the  left and an 11-mm implant on the right.  Additional Vitoss with bone  marrow aspirate was placed between the implants, as well as lateral to  the implants.  We then went ahead and draped the C-arm fluoroscope and  identified the pedicle entry sites for L4 bilaterally. Each of the  pedicles was probed, and then we aspirated another 5 cc of bone marrow  aspirate and injected another 5-cc strip of Vitoss, and then each of the  pedicles were tapped with a 4.25-mm tap.  We placed 4.75 x 40-mm screws  bilaterally at L5, as well as on the left side at L4.  On the right side  at L4, we were unhappy with the initial screw placement; therefore, we  probed the L4 pedicle.  It was retapped, and we placed a 5.75 x 40-mm  screw, and subsequent imaging showed the screw in good position.  We  then decorticated the transverse processes at L4 and L5 bilaterally and  packed Infuse and additional Vitoss with bone marrow aspirate in the  lateral gutters, and over the transverse processes and in the  intertransverse space.  And then we selected 35-mm prelordosed rods that  were placed in the screwheads and secured with locking caps, which were  tightened against the countertorque.  Once the construct was completed,  we inspected the spinal canal and again confirmed good decompression of  the thecal sac and nerve roots bilaterally, and then we proceeded with  closure.  The wound had been irrigated numerous times through the  procedure with Bacitracin solution.  The deep fascia was closed with  interrupted undyed 1 Vicryl suture.  The subcutaneous layer was closed with interrupted, inverted  2-0 undyed Vicryl suture, and the  subcutaneous and subcuticular were closed with interrupted, inverted 2-0  and 3-0 undyed Vicryl sutures, and the skin edges were approximated with  Dermabond.  The patient tolerated the procedure well.  The estimated  blood loss was 500 cc.  We were able to give back 250 cc of Cell Saver  blood.  Sponge and needle counts were correct.  Following surgery, the  patient was turned back to a supine position, reversed from his  anesthetic, extubated and transferred to the recovery room for further  care.      Hewitt Shorts, M.D.  Electronically Signed     RWN/MEDQ  D:  02/20/2006  T:  02/21/2006  Job:  [id number]   cc:   Cristi Loron, M.D.

## 2010-08-06 NOTE — H&P (Signed)
NAME:  Amanda Hogan, Amanda Hogan                         ACCOUNT NO.:  [id number]   MEDICAL RECORD NO.:  [id number]                   PATIENT TYPE:  INP   LOCATION:  0270                                 FACILITY:  [facility]   PHYSICIAN:  [name], M.D.                   DATE OF BIRTH:  Sep 19, 1928   DATE OF ADMISSION:  10/19/2001  DATE OF DISCHARGE:                                HISTORY & PHYSICAL   CHIEF COMPLAINT:  Fever in the setting of neutropenia.   HISTORY OF PRESENT ILLNESS:  This is a 77 year old female with known  lymphoma of the sinus diagnosed in 6/03.  She had most recently been treated  with Rituxan and CHOP.  Her last treatment was on 10/12/01.  She had called  the office on 7/29 complaining of a hacking cough and was given Tussionex  elixir.  Today she presents to the office just not feeling well and not  looking right, according to her daughter.  She will be admitted for further  evaluation.  Amanda Hogan had been in relatively good health all her life.  She has had sinus congestion for approximately two years prior to her  diagnosis.  She had tried a variety of agents without any relief.  She has  also had some tearing of the right eye.  She has had literally no pain.  She  was referred by Dorna Leitz, M.D., who noted an obstructive lymphocytic  mass extending in the right nasal cavity.  A biopsy was obtained and  revealed a lytic infiltrate of large lymphoid-appearing cells positive for  CD20 and negative for C3 cytokeratin marker and HMV45, etc.  This is  consistent with large-cell B lymphoma.   PAST MEDICAL HISTORY:  1. Hypertension.  2. Depression.  3. GERD.  4. Frequent vaginal yeast infections.  5. Prolapsed bladder.   PAST SURGICAL HISTORY:  1. Lung surgery five to six years ago for possible empyema.  2. History of hysterectomy.  3. History of mini-strokes two to three years ago.  4. History of urethral stone.  5. History of breast cyst, removed.  6. History  of hand/foot operation.  7. There is also a history of bursitis and a lump being removed from her     arm.   SOCIAL HISTORY:  Denies smoking history, and no alcohol consumption.  She  has been married for 52 years and has five children.  One lives in [place],  [place], one lives in [place], three live in [place].  She retired at  77 years of age from [employer].   ALLERGIES:  She is allergic to CONTRAST DYE, which has caused an  anaphylactic-type reaction.  She also has an adverse reaction to NOVOCAINE  causing severe nervousness.   FAMILY HISTORY:  She has one brother and one sister, who are alive and well.  There is a history of depression and  strokes in the family.  Both parents  died of MIs.  All children are alive and well.   REVIEW OF SYSTEMS:  Amanda Hogan states that her GERD symptoms have been  worse over the past two weeks.  She has not had any night sweats since she  has started treatment.  She had not taken her temperature until today but  did have chills yesterday.  Her vision has not changed.  She has had a  nagging cough for the last few nights causing a disruption in her sleep.  She also complains of a sore throat.  She denies any chest pain, nausea, or  vomiting.  She has had slight nausea as well as an episode of diarrhea a few  days ago.   PHYSICAL EXAMINATION:  VITAL SIGNS:  Weight is up four pounds at 176.  Blood pressure is 183/92,  temperature is 100.7, pulse is 109, respirations 120.   GENERAL:  This is a pleasant 77 year old female with alopecia.   HEENT:  Oropharynx:  There are a few minute areas of white plaque.  The  tongue is slightly coated.  Sclerae are anicteric.  The left nasal passage  is clear.  There is slight asymmetry of the right.  Tympanic membranes were  not observed.   LYMPHATIC:  There is some asymmetry of the neck but no discrete nodes found  in the cervical, supraclavicular, axillary, or inguinal region.   CHEST:  Breath  sounds clear to auscultation bilaterally.   CARDIAC:  Regular rate and rhythm, tachycardic.   ABDOMEN:  Obese, no organomegaly appreciated, no inguinal adenopathy.  Again, abdomen is obese and firm.   EXTREMITIES:  There is trace pedal edema.   NEUROLOGIC:  Alert and oriented x3.  Cranial nerves II-XII intact.  Strength  is 5/5.   LABORATORY DATA:  WBC 0.494, ANC is 0.014, hemoglobin is 9.32, hematocrit  26.8, MCV is 95.5, platelets are 34.   IMPRESSION AND PLAN:  Amanda Hogan presents today with fever and  neutropenia, status post CHOP/Rituxan therapy.  Her last Rituxan was on  7/24.  Her last CHOP was on 7/25.  She is now status post seven days post  therapy.  She will be admitted for further evaluation, IV antibiotics, and  supportive care.                                                   [name], M.D.    PR/MEDQ  D:  10/19/2001  T:  10/20/2001  Job:  [id number]

## 2010-08-06 NOTE — Op Note (Signed)
NAME:  Amanda Hogan, Amanda Hogan                         ACCOUNT NO.:  [id number]   MEDICAL RECORD NO.:  [id number]                   PATIENT TYPE:  OIB   LOCATION:  5727                                 FACILITY:  [facility]   PHYSICIAN:  Gita Kudo, M.D.              DATE OF BIRTH:  1928/04/08   DATE OF PROCEDURE:  09/09/2003  DATE OF DISCHARGE:                                 OPERATIVE REPORT   PREOPERATIVE DIAGNOSIS:  Cholecystitis, cholelithiasis.   POSTOPERATIVE DIAGNOSIS:  Cholecystitis, cholelithiasis. Normal  cholangiogram.   OPERATION PERFORMED:  Laparoscopic cholecystectomy with intraoperative  cholangiogram.   SURGEON:  Gita Kudo, M.D.   ASSISTANT:  Sheppard Plumber. Earlene Plater, M.D.   ANESTHESIA:  General endotracheal.   INDICATIONS FOR PROCEDURE:  The patient is a 77 year old female with a  recent severe attack of right upper quadrant abdominal pain.  Ultrasound  showed stone and her liver function study was normal.   OPERATIVE FINDINGS:  The patient's gallbladder was thin-walled and had at  least one medium large stone in it.  The cystic duct and artery were normal  in anatomy.  The cholangiogram appeared normal.   DESCRIPTION OF PROCEDURE:  Under satisfactory general endotracheal  anesthesia, having received 1 g Ancef, the patient's abdomen was prepped and  draped in the standard fashion.  A total of 25 mL of 0.5% Marcaine with  epinephrine was infiltrated at the skin incisions for postoperative  analgesia.  A transverse incision was made above the umbilicus and the  midline opened into the peritoneum.  Controlled with a figure-of-eight 0  Vicryl suture and operating Hasson port inserted, secured and good CO2  pneumoperitoneum established.  Camera placed and with excellent  visualization, two #5 ports placed laterally and a second #10 medially.  With graspers in the lateral ports giving good exposure, I carefully  dissected the cystic duct and cystic artery.  When these  structures were  securely identified, the artery was controlled with multiple clips and a  single clip placed on the cystic duct near the gallbladder.  Incision made,  catheter placed and cholangiogram obtained that looked fine.  Catheter  withdrawn and the distal duct controlled with multiple clips and then it and  the artery divided.  The gallbladder was then removed from below upward  using coagulating spatula for hemostasis and dissection. After the  gallbladder was removed from the liver bed, the bed was checked for  hemostasis and cauterized.  Lavaged with saline and suctioned dry.   Camera then moved to the upper port and through the lower port, a large  grasper used to remove the gallbladder through the umbilical incision  intact, without spillage or problem.  Then the operative site was again  checked, lavaged and suctioned dry.  The ports removed under direct vision,  then CO2 released and the midline closed with a  previous figure-of-eight and a second interrupted 0 Vicryl.  Subcu was  approximated with 4-0 Vicryl and then Steri-Strips to all incisions.  There  were no complications.  The sponge and needle counts were correct.  The  patient tolerated the procedure well and went to the recovery room in good  condition.                                               Gita Kudo, M.D.    MRL/MEDQ  D:  09/09/2003  T:  09/10/2003  Job:  [id number]   cc:   Donia Guiles, M.D.  [address] [place]  [place] [address]  Fax: [phone]

## 2010-08-06 NOTE — Procedures (Signed)
EEG NUMBER:  [id number]   CLINICAL HISTORY:  This 77 year old patient being evaluated for  __________ and aphagia.  __________ to evaluate for seizure versus  stroke.   MEDICATIONS:  Hydrochlorothiazide, Protonix, Avapro, Tenormin, Crestor,  aspirin, Atarax and Ambien.   As EEG performed, the patient described as being asleep.  The background  rhythm consists of 6 or 7 Hz theta which is rhythmic, symmetric and  __________ eye opening and closure.  Intermittent 3-4 delta slowing is  seen throughout the recording.  No awake rhythm is seen in this tracing.  No paroxysmal epileptiform activity, spikes or sharp waves are seen.  Length of this EEG is 22.2 minutes.  Technical component is average.  EKG tracing revealed regular sinus rhythm.  Hyperventilation and photic  stimulation unremarkable.   IMPRESSION:  This EEG is abnormal due to presence of excessive sleep.  No definitive epileptiform activity is seen.  Is seizure was suspected,  will repeat.  Study may be beneficial.           ______________________________  Sunny Schlein. Pearlean Brownie, MD     JWJ:[id number]  D:  02/23/2006 17:19:52  T:  02/23/2006 21:56:20  Job #:  [id number]

## 2010-08-06 NOTE — Discharge Summary (Signed)
NAMEEBELIN, DILLEHAY NO.:  [id number]   MEDICAL RECORD NO.:  [id number]          PATIENT TYPE:  INP   LOCATION:  3032                         FACILITY:  [facility]   PHYSICIAN:  Cristi Loron, M.D.DATE OF BIRTH:  May 02, 1928   DATE OF ADMISSION:  02/20/2006  DATE OF DISCHARGE:  03/01/2006                               DISCHARGE SUMMARY   BRIEF HISTORY:  The patient is a 77 year old woman who presented with  low back pain and left lumbar radiculopathy.  She was evaluated with MRI  scan and myelogram post myelogram CT and x-rays and was found to have a  dynamic degenerative spondylolisthesis of L4-5 with associated  spondylytic disk herniation, worse on the left than on the right, and  associated underlying degenerative disk disease and spondylosis.  Decision was made to proceed with decompression and arthrodesis.   For further details of this admission, please refer to typed History &  Physical.   HOSPITAL COURSE:  The patient was admitted to [facility] on  February 20, 2006.  On the day of admission, the patient underwent an L4-  5 decompression, infusion, instrumentation.  The surgery went well (for  full details of this operation, please refer to typed operative note).   POSTOPERATIVE COURSE:  The patient's postoperative course was as  follows:  The patient had an episode of rapid change in mental status.  She was worked with an MRI scan of the brain and Dr. Vickey Huger saw the  patient.  The MRI scan turned out negative for stroke.  The patient was  observed over the next few days.  She had an EEG done.  The patient's  mental status improved.  Dr. Newell Coral had PT/OT see the patient.  PMR  subsequently saw the patient and did not think she needed inpatient  rehab and suggested and recommended home health PT/OT.   By March 01, 2006, the patient was afebrile, vital signs were stable.  She was alert and oriented and ambulating adequately.  She is  requesting  discharge to home.  The patient was, therefore, discharged home on  March 01, 2006.   DISCHARGE INSTRUCTIONS:  The patient was instructed to follow up with  Dr. Ezzard Standing and given written discharge instructions.   DISCHARGE PRESCRIPTIONS:  Percocet 5 #100, one to two p.o. q.4h. p.r.n.  for pain.   FINAL DIAGNOSES:  1. L4-5 dynamic degenerative spondylolisthesis.  2. Spondylitic disk herniation.  3. Degenerative disk disease with spondylosis.  4. Mental status changes.   PROCEDURE PERFORMED:  Bilateral L4-5 laminectomy, facetectomy and  microdiskectomy, and posterior lumbar interbody fusion with AVS PEEK  interbody implants with V-Tuss with bone marrow aspirate, and bilateral  L4-5 posterolateral arthrodesis with 90D posterior instrumentation and V-  Tuss with bone marrow aspirate and infuse and microdissection.      Cristi Loron, M.D.  Electronically Signed     JDJ/MEDQ  D:  05/11/2006  T:  05/12/2006  Job:  [id number]   cc:   Melvyn Novas, M.D.

## 2010-08-06 NOTE — Consult Note (Signed)
NAMEJERNEE, Amanda Hogan NO.:  [id number]   MEDICAL RECORD NO.:  [id number]          PATIENT TYPE:  INP   LOCATION:  3032                         FACILITY:  [facility]   PHYSICIAN:  Melvyn Novas, M.D.  DATE OF BIRTH:  April 29, 1928   DATE OF CONSULTATION:  DATE OF DISCHARGE:                                 CONSULTATION   The patient was admitted to the service of Dr. Shirlean Kelly,  neurosurgeon, on February 20, 2006, and was evaluated today on February 22, 2006, for acute speech difficulty.   The patient was found in a decreased responsive state with possible  aphasia and is described as unable to follow any commands.  The patient  showed no focal weakness, no cranial nerve abnormality, or dysarthria.  Dr. Newell Coral requested the neurologic evaluation to rule out stroke.  He  was concerned that during the patient's afternoon visitation, she seemed  to be unable to answer any of his questions or comment on any of the  outcome of the recent surgery, which has been a lumbar laminectomy, I  understand.  An MRI of the brain was ordered.  The patient was just  taken off the floor.   The patient has, at this time, stable vital signs with a temperature of  98.7, blood pressure range of 150/81, pulse rate is in the 90.  O2  saturation is in the high 90s, normal range.  The patient is atraumatic,  normocephalic, anicteric.  She appears slightly pale.  She has no  abnormalities to the extremities.  No edema, no clubbing, no cyanosis.  The surgical site is dressed and her lower extremities are in sequential  stocking devices to prevent DVTs.   REVIEW OF SYSTEMS:  This is obtained through the family and Dr.  Newell Coral.  The patient herself cannot answer.  She is unable to verbally  express herself and cannot follow motor commands.  She had, earlier  today, complained about nausea.  The patient is in bed, day 2 of post  laminectomy.   She has a past medical history of:  1.  Degenerative disk disease.  2. GERD.  3. Hypertension.  4. Depression.  5. Hypercholesterolemia.   MEDICATIONS AT TIME OF ADMISSION:  Are as follows:  1. Hydrochlorothiazide 25 mg a day.  2. Protonix 80 mg a day.  3. Atenolol 100 mg daily.  4. Avapro 300 mg daily.  5. Multivitamins daily.  6. Crestor 20 mg daily.  7. Calcium supplement daily.  8. Aspirin, baby size daily.  9. Toradol 30 mg q.6 hours.  10.Morphine sulfate 25 mg q.4 hours and as needed.  11.Sertraline/Zoloft 25 mg daily.  12.Ambien and senna p.r.n. are noted.   NO KNOWN ALLERGIES.   SOCIAL HISTORY:  The patient is married with adult healthy children,  nonsmoker, nondrinker.   General physical was not evaluated.  The patient was not evaluated for  abdominal or chest exam and neurological focal exam was undertaken.  The  patient appears drowsy and confused.  She shows nonfluent speech.  Also  she does not show any neologism or paraphrenia.  There is also no  evidence of dysarthria.  She is unable to answer questions and just  utters I do not know, I can't answer, I do not know.  She is unable to  follow any motor commands and when demonstrated what to do, seems to be  able to mimic the movement.  Cranial nerve examination shows pupils are  equal to light and accommodation.  Full extraocular movements.  Her  pupils are rather small at 3 mm baseline.  Facial symmetry is preserved.  Tongue and uvula are midline.  There is no evidence of any tremor,  fasciculation.  No decreased neck motion.  The facial sensory was not  tested, as the patient cannot answer questions.  She denies any pain.  She shows no tremor.  Motor exams shows equal dependent reflexes on both  upper extremities.  The lower extremities patella reflexes were also  obtainable.  Babinski is negative.  She shows symmetric strength in her  upper extremities and bilaterally weak rub.  Lower extremities sensory  examination could also not be performed.   The patient cannot cooperate.  Gait, of course, had to be deferred, given that the patient is just  postoperative day 2.   ASSESSMENT:  The MRI was reviewed and also I was concerned that she  seemed to have had a posterior stroke with left hemispheric symptoms,  such as aphasia and decreased spontaneous eye movements, a limited  cooperation for the visual field examination, and ischemic stroke could  not be confirmed.  Further differentials now include medication  reaction, delirium, or postictal.  No seizure activity was seen, but I  will order an EEG.  I would also plan to reevaluate the antibiotic and  pain medicines given for the potential of causing confusion, delirium,  or seizures.  I would suggest that a review take place in the morning  with her treating physicians present.  I further suggested a speech  evaluation, should she continue to show the same symptoms tomorrow and I  have not ordered any further imaging studies or Doppler studies.      Melvyn Novas, M.D.  Electronically Signed     CD/MEDQ  D:  02/22/2006  T:  02/23/2006  Job:  [id number]   cc:   Hewitt Shorts, M.D.

## 2010-08-06 NOTE — Discharge Summary (Signed)
NAME:  Amanda Hogan, Amanda Hogan                         ACCOUNT NO.:  [id number]   MEDICAL RECORD NO.:  [id number]                   PATIENT TYPE:  INP   LOCATION:  0270                                 FACILITY:  [facility]   PHYSICIAN:  [name], M.D.                   DATE OF BIRTH:  [birth year]   DATE OF ADMISSION:  10/19/2001  DATE OF DISCHARGE:  10/24/2001                                 DISCHARGE SUMMARY   _   DISCHARGE DIAGNOSES:  1. History of sinus lymphoma.  2. History of hypertension and depression.  3. Gastroesophageal reflux disease.  4. History of prolapsed bladder.   HISTORY OF PRESENT ILLNESS:  The patient is a 77 year old woman with known  history of sinus lymphoma treated with Rituxan and CHOP chemotherapy.  She  was admitted to the hospital after undergoing chemotherapy which she stopped  on 10/12/01.  She was admitted on 10/19/2001 with fever.  She had a  temperature of 100.7, a nagging cough, and a sore throat.  The patient was  subsequently admitted to the hospital.   PHYSICAL EXAMINATION:  VITAL SIGNS: On admission the patient looked stable,  blood pressure is 183/92, temperature was 100.7, pulse was 109 and  respiratory rate was 20.  HEENT:  Oropharynx showed some areas of plaque and was coated.  LUNGS:  Lungs were clear.  LYMPHATIC  No discrete adenopathy.  CARDIAC:  Cardiac exam within normal limits.  ABDOMEN:  Nontender, no organomegaly or masses.  EXTREMITIES:  No cyanosis, clubbing or edema.   LABORATORY DATA:  Baseline labs:  White count of 44, hemoglobin is 9.3,  platelet count of 34,000.   HOSPITAL COURSE:  The patient was admitted to the hospital and started on IV  antibiotics with Epogen support.  She was started on Maxipime and Diflucan.  Temperature on 10/21/01 white count was 2.0, hemoglobin 8.6 and platelet  count of ___,000.  Patient's antibiotics were changed because of elevated  temperature to Zithromax and Primaxin.  She gradually improved in  hospital.  The WBC count on 10/24/01 was 0.5, hemoglobin 10.8 and platelet count was  186,000.  The patient was felt to be stable for discharge home on 10/24/01  and was discharged on Tenormin 25 mg a day, Zoloft 300 mg a day, Procardia  XL 50 mg a day, Fosamax 5 cc every 8-12 hours as needed, Protonix 40 mg a  day, Zithromax 250 mg orally for 5 days, Atacand 16 mg a day.  She is to  asked to follow up in the outpatient clinic for Epogen injection and to see  Dr. Donnie Coffin.  The patient is discharged from the hospital in stable condition.  [name], M.D.    PR/MEDQ  D:  01/02/2002  T:  01/02/2002  Job:  [id number]

## 2010-08-06 NOTE — Discharge Summary (Signed)
NAME:  Amanda Hogan, Amanda Hogan                         ACCOUNT NO.:  [id number]   MEDICAL RECORD NO.:  [id number]                   PATIENT TYPE:  INP   LOCATION:  0270                                 FACILITY:  [facility]   PHYSICIAN:  [name], M.D.                   DATE OF BIRTH:  [birth year]   DATE OF ADMISSION:  10/19/2001  DATE OF DISCHARGE:  10/24/2001                                 DISCHARGE SUMMARY   DISCHARGE DIAGNOSES:  1. History of sinus lymphoma.  2. History of hypertension and depression.  3. Gastroesophageal reflux disease.  4. History of prolapsed bladder.   HISTORY OF PRESENT ILLNESS:  The patient is a 77 year old woman with known  history of sinus lymphoma treated with Rituxan and CHOP chemotherapy.  She  was admitted to the hospital after undergoing chemotherapy which she stopped  on 10/12/01.  She was admitted on 10/19/2001 with fever.  She had a  temperature of 100.7, a nagging cough, and a sore throat.  The patient was  subsequently admitted to the hospital.   PHYSICAL EXAMINATION:  VITAL SIGNS: On admission the patient looked stable,  blood pressure is 183/92, temperature was 100.7, pulse was 109 and  respiratory rate was 20.  HEENT:  Oropharynx showed some areas of plaque and was coated.  LUNGS:  Lungs were clear.  LYMPHATIC  No discrete adenopathy.  CARDIAC:  Cardiac exam within normal limits.  ABDOMEN:  Nontender, no organomegaly or masses.  EXTREMITIES:  No cyanosis, clubbing or edema.   LABORATORY DATA:  Baseline labs:  White count of 44, hemoglobin is 9.3,  platelet count of 34,000.   HOSPITAL COURSE:  The patient was admitted to the hospital and started on IV  antibiotics with Epogen support.  She was started on Maxipime and Diflucan.  Temperature on 10/21/01 white count was 2.0, hemoglobin 8.6 and platelet  count of ___,000.  Patient's antibiotics were changed because of elevated  temperature to Zithromax and Primaxin.  She gradually improved in  hospital.  The WBC count on 10/24/01 was 0.5, hemoglobin 10.8 and platelet count was  186,000.  The patient was felt to be stable for discharge home on 10/24/01  and was discharged on Tenormin 25 mg a day, Zoloft 300 mg a day, Procardia  XL 50 mg a day, Fosamax 5 cc every 8-12 hours as needed, Protonix 40 mg a  day, Zithromax 250 mg orally for 5 days, Atacand 16 mg a day.  She is to  asked to follow up in the outpatient clinic for Epogen injection and to see  Dr. Donnie Coffin.  The patient is discharged from the hospital in stable condition.  [name], M.D.    PR/MEDQ  D:  01/01/2002  T:  01/01/2002  Job:  [id number]

## 2010-08-06 NOTE — Procedures (Signed)
EEG REPORT:  EEG [id number].   CLINICAL HISTORY:  The patient is a 77 year old evaluated for possible  stroke.  Study is being done to look for the presence of seizures as  well.  780.39.   PROCEDURE:  The tracing is carried out on a 32-channel digital Cadwell  recorder reformatted into 16 channel montages with one devoted to EKG.  The patient was awake and restless.  Medications include  hydrochlorothiazide, Protonix, Avapro, Tenormin, Crestor, Ambien, Atarax  and aspirin.   DESCRIPTION OF FINDINGS:  Background activity shows a mixture of  rhythmic 6 Hz theta range activity of 35 microvolts, superimposed 1-2 Hz  50 microvolt delta range activity.  There was no focal slowing in the  background.  There was no interictal epileptiform activity in the form  of spikes or sharp waves.  EKG showed regular sinus rhythm with  ventricular response of 72 beats per minute, occasional PVCs were seen.   IMPRESSION:  Abnormal EEG on the basis of diffuse background slowing.  This is a nonspecific indicator of neuronal dysfunction, maybe on a  primary degenerative basis secondary to a variety of toxic or metabolic  etiologies.  No seizure activity was seen, nor was there evidence of  focal slowing to suggest a structural and/or vascular abnormality.      Deanna Artis. Sharene Skeans, M.D.  Electronically Signed     ZYS:AYTK  D:  02/24/2006 20:24:03  T:  02/25/2006 16:59:28  Job #:  [id number]

## 2010-08-06 NOTE — Op Note (Signed)
Amanda Hogan, Amanda Hogan               ACCOUNT NO.:  [id number]   MEDICAL RECORD NO.:  [id number]          PATIENT TYPE:  AMB   LOCATION:  SDS                          FACILITY:  [facility]   PHYSICIAN:  Gita Kudo, M.D. DATE OF BIRTH:  11-Nov-1928   DATE OF PROCEDURE:  07/19/2005  DATE OF DISCHARGE:                                 OPERATIVE REPORT   CHIEF COMPLAINT:  Repair umbilical-abdominal wall hernia, Ventralex mesh-  large, 8 cm diameter.   SURGEON:  Gita Kudo, M.D.   ANESTHESIA:  General anesthesia.   PREOPERATIVE DIAGNOSIS:  Abdominal wall-umbilical hernia.   POSTOPERATIVE DIAGNOSIS:  Abdominal wall-umbilical hernia.   CLINICAL HISTORY:  A 77 year old female with symptomatic abdominal wall  hernia.  She has had a laparoscopic cholecystectomy by myself two years ago.  For the past several months, she has noted an enlarging lump at and above  the umbilicus that is reducible but becoming more symptomatic.   FINDINGS:  The patient had a very large abdominal wall hernia above the  umbilicus.  It appeared than the incision that I made for her surgery  before.  I do not think it was a true incisional hernia but it certainly was  a large size abdominal wall defect - probably epigastric and the defect was  approximately 4 to 5 cm in size.   DESCRIPTION OF PROCEDURE:  Under satisfactory general endotracheal  anesthesia, having received 1 g Ancef preoperatively, the patient's abdomen  was prepped and draped in a standard fashion.  A total of 30 mL of 0.25%  Marcaine with epinephrine was infiltrated during the procedure for  postoperative analgesia.  A transverse incision made above her previous  umbilical incision and over the central portion of her defect.  This was  carried down into the subcu and cautery used.  The hernia sac was identified  and freed all around.  It was opened in two places and oversewn with Vicryl  suture.  Then, cautery was used to develop a plane in  the preperitoneum.  Again, the hernia sac was opened in a few places and it was just left.  The  hernia sac was reduced and finger dissection used to develop a placement  plane for the mesh.  When this was completed, the 8 cm circle of Ventralex  mesh was selected and noted to fit nicely with good overlap all around.  It  was then secured with quadrant sutures of 0 Prolene that went through the  fascia and abdominal wall through the mesh and back up through the fascia.  Following this, additional sutures were placed between so that the area was  fixed nicely to the abdominal wall in at least eight places. The defect was  then closed under slight tension with interrupted 0 Prolene figure-of-eight  and running #1  PDS, taking bites of the mesh, again to secure good adhesion.  The wound was  lavaged with saline, checked for hemostasis and closed in layers with 2-0  Vicryl and then staples for the skin.  A sterile absorbent dressing was  applied and the patient went  to the recovery room from the operating room in  good condition.           ______________________________  Gita Kudo, M.D.     MRL/MEDQ  D:  07/19/2005  T:  07/19/2005  Job:  [id number]   cc:   Donia Guiles, M.D.  Fax: [phone]

## 2010-08-06 NOTE — Procedures (Signed)
[facility]  Patient:    Amanda Hogan, Amanda Hogan                      MRN: [id number] Proc. Date: 11/05/99 Adm. Date:  [id number] Disc. Date: [id number] Attending:  Rich Brave CC:         [name], M.D.   Procedure Report  PROCEDURE:  Upper endoscopy.  ENDOSCOPIST:  Florencia Reasons, M.D.  INDICATION:  A 77 year old female with significant reflux symptoms.  FINDINGS:  Probable small hiatal hernia.  PROCEDURE:  The nature and purposefulness for the procedure had been discussed with the patient and provided written consent.  SEDATION:  Fentanyl 50 mcg and Versed 5 mg IV without arrhythmias or desaturation.  PROCEDURE:   The Olympus video endoscope was passed under direct vision.  The vocal cords looked normal.  The esophagus was unremarkable, specifically without reflux esophagitis, Barretts esophagus, varices, infection, or neoplasia.  No ring, stricture or large hiatal hernia was present.  It did appear there was a small hiatal hernia that was also seen on retroflex viewing.  There was no evidence of gastritis, erosions, ulcers, polyps or masses.  The pylorus and duodenal bulb were normal except for some patchy erythema in the distal bulb probably due to Aspirin exposure.  The second duodenum looked normal.  The scope was removed from the patient after an unremarkable retroflex view of the proximal stomach.  No biopsies were obtained.  The patient tolerated the procedure well and there were no apparent complications.  IMPRESSION:  Essentially normal upper endoscopy.  Slight duodenitis, possibly Aspirin related.  Possible small hiatal hernia.  PLAN:  Continue antipeptic therapy with Prevacid 30 mg daily since, when she went off it, her reflux symptoms resumed. DD:  11/05/99 TD:  11/06/99 Job: [id number]

## 2010-08-06 NOTE — H&P (Signed)
NAME:  Amanda Hogan, Amanda Hogan                         ACCOUNT NO.:  [id number]   MEDICAL RECORD NO.:  [id number]                   PATIENT TYPE:  INP   LOCATION:  2004                                 FACILITY:  [facility]   PHYSICIAN:  Melissa L. Ladona Ridgel, MD               DATE OF BIRTH:  15-Nov-1928   DATE OF ADMISSION:  08/18/2003  DATE OF DISCHARGE:                                HISTORY & PHYSICAL   PRIMARY CARE PHYSICIAN:  Donia Guiles, M.D.   CHIEF COMPLAINT:  Pain in the back under her right shoulder blade x 3-4  weeks with associated shortness of breath.   HISTORY OF PRESENT ILLNESS:  The patient is a 77 year old white female with  a three to four-week history of right subscapular pain.  She states that the  pain has continued to worsen.  It has gotten to the point where she cannot  get up from the bed without helping herself by either lifting her arm or  splinting her shoulder.  She rates the pain at an 8/10 and deep.  She  states that she had a sensation in her chest which is described as heavy or  as if she has a chest cold.  She states that last night she could not sleep  secondary to the pain and she also developed a sensation of shortness of  breath.   REVIEW OF SYSTEMS:  Negative for fever and chills.  Positive shortness of  breath and a dry cough.  Positive palpitations occasionally.  About one-and-  a-half weeks ago, she did have some nausea and vomiting, but that has  resolved on its own.  Occasionally she has left arm and hand tingling, which  she has not told anyone about.  She denies any dysuria.  Occasional  diarrhea.  She does have no melena and no hematochezia.  She currently has a  headache and occasionally does have dizziness.   PAST MEDICAL HISTORY:  1. GERD.  2. 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  She received Rituxan     and CHOP therapy for this, as well as radiation.  3. She has had two strokes in the past that she is aware of.  4. She denies any  heart disease, although she did see Dr. Aleen Campi years ago     and had a stress test at that time, the results of which she is not aware     of.  5. She states that she may have had a diagnosis of angina at one time.  6. She has hypertension.  7. She had pneumonia with an empyema resulting in what sounds like a VATS     procedure to scrape the lung.  8. She has depression.   PAST SURGICAL HISTORY:  1. Possible VATS procedure and a chest tube.  2. Hysterectomy.  3. Kidney stone removal.  4. Breast cyst evaluation.  5. Elbow bursitis  surgery.  6. Foot surgery.   ALLERGIES:  1. IV CONTRAST.  2. NOVOCAINE.  3. XYLOCAINE.   MEDICATIONS:  1. Atenolol 25 mg daily.  2. Atacand 16 mg daily.  3. Nifedipine 60 mg daily.  4. Nexium 40 mg daily.  5. Zoloft 25 mg q.a.m.   SOCIAL HISTORY:  She denies tobacco or alcohol.  She is retired for  [employer].   FAMILY HISTORY:  Her father is deceased from an MI.  Mom is deceased from  strokes and a possible MI at the age of 55.   PHYSICAL EXAMINATION:  VITAL SIGNS:  The temperature is 97.8 degrees.  The  blood pressure initially was 185/88 and now is found to be 154/65.  Her  pulse is 76.  Her respiratory rate is 18.  She is 98% on room air.  GENERAL APPEARANCE:  She is in no acute distress.  HEENT:  She is normocephalic and atraumatic.  Pupils equal, round, and  reactive to light.  Extraocular movements are intact.  Mucous membranes are  moist.  NECK:  Supple.  There is no JVD.  No lymph nodes.  No bruits.  CHEST:  Clear to auscultation.  There are no rales, rhonchi, or wheezes.  CARDIOVASCULAR:  Regular rate and rhythm.  Positive S1 and S2 with  occasional PVC.  There is no S3 or S4.  No murmurs, rubs, or gallops.  ABDOMEN:  Soft, nontender, and nondistended with positive bowel sounds.  She  has no hepatosplenomegaly.  EXTREMITIES:  Mild erythema of the left fourth digit with DIP joint  involvement.  She has 2+ pulses with no edema.   NEUROLOGIC:  She is nonfocal.  MUSCULOSKELETAL:  Exam does not reproduce the shoulder pain.  The deep  tendon reflexes are 2+.  Sensation is grossly intact.   LABORATORY VALUES:  The white count is 5.6, hemoglobin 11.7, hematocrit  33.5, and platelet count 338.  Her point of care enzymes are negative x 2.  Her  D-dimer is slightly elevated at 0.5.  The sodium is 138, the potassium  is 3.9, the chloride is 105, the CO2 is 28, the BUN is 14, the creatinine is  0.8, the glucose is 110, and the calcium is 9.0.  Her EKG reveals a normal  sinus rhythm with no ST-T wave changes.   ASSESSMENT AND PLAN:  This is a 77 year old white female with a complaint of  progressively worsening dyspnea on exertion and shortness of breath at rest  associated with three to four weeks of right subscapular pain.  The  patient's description of her symptoms is a little bit disjointed, however,  her symptoms are suspicious for possible pulmonary embolus versus  gallbladder disease versus myocardial infarction with a complaint of  heaviness.   1. Pulmonary.  The dyspnea on exertion and shortness of breath are     questionably secondary to underlying scarring related to previous VATS     procedure versus pulmonary embolus, specifically in the face of a mild     increase in D-dimer, versus a potential cardiovascular source.  The     patient has a history of IV contrast allergy and therefore will need to     be prepped for CT scan of the chest.  VQ scan is not the optimal test at     this time because of the scarring from her previous VATS procedure, which     may impede the general diagnosis of a pulmonary embolus.  We will  therefore prep the patient 24 hours in advance for now only a chest CT,     but after conferencing with Dr. Donnie Coffin, an IV contrasted head and neck CT     which the patient was scheduled for early next month.  Since the patient    has an IV contrast allergy, we would like to accomplish the  complete     studies all at once instead of exposing her twice to prednisone prep and     IV contrast.  Will attempt to obtain PFTs and a DCLO while here in the     hospital.  Will check an ABG and provide Lovenox at this time for DVT     prophylaxis only.  Should she show signs or symptoms consistent with PE,     we will increase her Lovenox coverage.  2. Cardiovascular.  The patient has dyspnea on exertion and dyspnea at rest     with atypical subscapular pain and some chest heaviness.  Question if     this is cardiovascular versus gallbladder versus pulmonary versus     musculoskeletal.  At this time, a musculoskeletal exam does not reproduce     the pain.  Will check a cardiac panel q.8h. and check a 2-D     echocardiogram.  Will add aspirin.  The patient was off her blood     pressure medications this morning, which is why her blood pressure is     slightly elevated.  We will follow this and continue her home medications     at this time.  3. Gastrointestinal.  Will check ultrasound of the abdomen to assess the     gallbladder and pancreas, as well as evaluate amylase, lipase, and LFTs.     Question if the gallbladder disease is a source for her subscapular pain.     We will continue with Protonix for GI prophylaxis.  4. Genitourinary.  Will check a UA and C&S.  5. Endocrine.  Will follow her a.m. glucose while she is on prednisone and     provide coverage should she require insulin for elevated glucose.  The     patient will also have her hemoglobin A1C, homocystine, and TSH checked,     especially in light of her symptoms of recent numbness and tingling in     her left arm, which could represent TIA versus stroke.   The patient will be admitted for observation for 24 hours and completion of  these exams.  Should she require full admission, we will convert her to a  full admit.                                                Melissa L. Ladona Ridgel, MD    MLT/MEDQ  D:  08/18/2003   T:  08/18/2003  Job:  [id number]   cc:   Donia Guiles, M.D.  [address] [place]  [place] [address]  Fax: [phone]

## 2010-08-06 NOTE — Discharge Summary (Signed)
NAME:  Amanda Hogan, Amanda Hogan                         ACCOUNT NO.:  [id number]   MEDICAL RECORD NO.:  [id number]                   PATIENT TYPE:  INP   LOCATION:  2004                                 FACILITY:  [facility]   PHYSICIAN:  Hollice Espy, M.D.            DATE OF BIRTH:  1928/07/15   DATE OF ADMISSION:  08/18/2003  DATE OF DISCHARGE:  08/19/2003                                 DISCHARGE SUMMARY   PRIMARY CARE PHYSICIAN:  Donia Guiles, M.D.   ONCOLOGIST:  [name], M.D.   DISCHARGE DIAGNOSES:  1. Atypical right-sided chest pain, believed to be secondary to gallstones.  2. History of sinus cancer.  3. History of gastroesophageal reflux disease.  4. History of cerebrovascular accident x2.  5. Questionable history of angina.  6. History of pneumonia with empyema and questionable VATS procedure.  7. History of depression.  8. History of hypertension.   DISCHARGE MEDICATIONS:  1. Atenolol 16 mg daily.  2. Atacand 60 mg daily.  3. Nifedipine 60 mg daily.  4. Nexium 40 mg daily.  5. Zoloft 25 mg daily.   NEW PRESCRIPTIONS:  She has received new prescriptions for Ambien 5 mg p.o.  at bedtime p.r.n., Percocet 5/325 mg p.o. q.6h. p.r.n.   HISTORY OF PRESENT ILLNESS:  This is a 77 year old white female with a past  medical history of 3-4 weeks of right subscapular pain, intermittent but  over the last few weeks, has bee getting progressively worse and more  common.  The patient finally got at the point where she could not get up  from bed and pain was 8/10.  The patient felt that she had a sensation of  chest discomfort described as heavy, but again, this was more on the right  side.  In addition, she also complained of some occasional episodes of  nausea and vomiting, and right upper quadrant of the abdomen cramping.  She  could not tell me if it was associated with or without eating secondary to  the chest pain.  She has also had some episodes of progressively severe  nausea and vomiting especially with greasy and fatty foods or with coffee.   HOSPITAL COURSE:  1. With regard to patient's chest pain, the patient came in and there was     some concern that she might have had a pulmonary embolus at that time.     She has a history of IV contrast dye (allergy) and the patient was     admitted for 24-hour observation secondary to premedication.  She     received a prednisone and fluid prep.  A CT of the chest was done on the     early morning of 08/19/2003 which was found to be completely normal, with     no previous change from her previous CT.  She was noted to have some     scarring secondary to a VAP procedure done, but  again showed no evidence     of pulmonary embolus.  An ABG done at admission was normal with pH of     7.41 with a normal pCO2 and pO2.  The patient was breathing comfortably     on room air.  Her D-dimer was slightly elevated at 0.5.  In addition, she     also had an ultrasound done of her gallbladder to evaluate for     gallstones.  The patient was noted to have several mobile calculi within     the gallbladder.  There was no associated gallbladder wall thickening or     biliary ductal dilatation.  The common duct measured 4.8 mm in diameter.     The liver, pancreas, spleen and kidneys were found to be normal.  The     patient, in addition, had normal liver function tests and a normal     amylase and lipase.  The patient's EKG was noted to be normal.  She had     cardiac enzymes which were normal as well.  She had an echocardiogram     done of her heart secondary to patient's previous history of questionable     heart disease.  Reportedly she has had a stress test in the past, not     knowing the results and has been told in the past reportedly, she has     angina.  Given the location of her symptoms, her EKG and enzymes, I felt     that we did not have to wait for her echocardiogram to discharge her to     home.  I feel that her pain  with intermittent as well as nausea and     vomiting, is all secondary to cholelithiasis and possible brief episodes     of cholecystitis.  I am recommending that patient is okay to be     discharged to home.  I have given her the number for [facility]     [facility], to follow up and have an outpatient cholecystectomy.  2. In regard to patient's history of sinus cancer which is actually     lymphoma, the patient was scheduled to have a head and neck CT done in     the next weeks as per request of [name], M.D., as followup.  Given     that she has an IV contrast allergy, we felt that we were able to get     both head and neck as well as chest on one setting so the patient would     not have to be premedicated twice.  The patient's head and neck CT was     reported as no evidence of change from previous studies.  The patient     will follow up with Dr. [name] as scheduled.  3. With regard to patient's hypertension, depression and other medical     conditions, these were all stable.  She was requesting something to help     her sleep, and I provided her with a prescription for Ambien.  4. In regard to patient's right-sided chest pain, I did a full     musculoskeletal exam on 08/19/2003, including shoulder rotation,     abduction and adduction of the shoulder, elbow, arm.  The patient has a 5-     strength but otherwise no focal deficit.  She has full range of motion     and I do not feel this is musculoskeletal and  is associated with any     evidence of any injury.   ACTIVITY:  The patient is to have activity as tolerated.   DISCHARGE DISPOSITION:  She is being discharged to home.  Her disposition is  improved in that she is having no pain currently at this time.  I have  advised her to avoid fatty foods as a diet for continuing her heart healthy  diet, to minimize any gallbladder symptoms.   She is to visit Donia Guiles, M.D., in his office in the next two weeks. I have  given her the number to follow up with [facility], and  she will follow up with Dr. [name] as scheduled.                                                Hollice Espy, M.D.    SKK/MEDQ  D:  08/19/2003  T:  08/19/2003  Job:  [id number]   cc:   Donia Guiles, M.D.  [address] [place]  [place] [address]  Fax: [phone]   [name], M.D.  [address] [facility]  [place]  [place] [address]  Fax: [phone]   [facility]

## 2010-08-06 NOTE — Discharge Summary (Signed)
   NAME:  Amanda Hogan, Amanda Hogan                         ACCOUNT NO.:  [id number]   MEDICAL RECORD NO.:  [id number]                   PATIENT TYPE:  INP   LOCATION:  0270                                 [facility]ACILITY:  [facility]   PHYSICIAN:  [name], M.D.                   DATE OF BIRTH:  [birth year]   DATE OF ADMISSION:  10/19/2001  DATE OF DISCHARGE:  10/24/2001                                 DISCHARGE SUMMARY   This report was duplicated when moved from [facility] to [employer]. Please  disregard.                                               [name], M.D.    PR/MEDQ  D:  01/01/2002  T:  01/02/2002  Job:  [id number]

## 2010-12-09 LAB — DIFFERENTIAL
Basophils Absolute: 0
Eosinophils Relative: 4
Lymphocytes Relative: 35
Monocytes Absolute: 0.6
Monocytes Relative: 11

## 2010-12-09 LAB — URINALYSIS, ROUTINE W REFLEX MICROSCOPIC
Bilirubin Urine: NEGATIVE
Glucose, UA: NEGATIVE
Hgb urine dipstick: NEGATIVE
Ketones, ur: NEGATIVE
Nitrite: NEGATIVE
pH: 7

## 2010-12-09 LAB — COMPREHENSIVE METABOLIC PANEL
AST: 21
Albumin: 3.8
Chloride: 99
Creatinine, Ser: 1.01
GFR calc Af Amer: >60
Potassium: 4.4
Total Bilirubin: 0.5

## 2010-12-09 LAB — CBC
Platelets: 286
WBC: 5.7

## 2010-12-09 LAB — URINE MICROSCOPIC-ADD ON

## 2010-12-17 LAB — GLUCOSE, CAPILLARY: Glucose-Capillary: 154 — ABNORMAL HIGH

## 2011-07-21 ENCOUNTER — Observation Stay (HOSPITAL_COMMUNITY)
Admission: EM | Admit: 2011-07-21 | Discharge: 2011-07-22 | Disposition: A | Discharge status: Home / Self Care | Payer: Medicare Other | Attending: Cardiovascular Disease | Admitting: Cardiovascular Disease

## 2011-07-21 ENCOUNTER — Encounter (HOSPITAL_COMMUNITY): Payer: Self-pay | Admitting: Emergency Medicine

## 2011-07-21 ENCOUNTER — Emergency Department (HOSPITAL_COMMUNITY): Payer: Medicare Other

## 2011-07-21 DIAGNOSIS — K219 Gastro-esophageal reflux disease without esophagitis: Secondary | ICD-10-CM | POA: Insufficient documentation

## 2011-07-21 DIAGNOSIS — R413 Other amnesia: Secondary | ICD-10-CM | POA: Insufficient documentation

## 2011-07-21 DIAGNOSIS — I10 Essential (primary) hypertension: Secondary | ICD-10-CM | POA: Insufficient documentation

## 2011-07-21 DIAGNOSIS — E7800 Pure hypercholesterolemia, unspecified: Secondary | ICD-10-CM | POA: Insufficient documentation

## 2011-07-21 DIAGNOSIS — Z8673 Personal history of transient ischemic attack (TIA), and cerebral infarction without residual deficits: Secondary | ICD-10-CM

## 2011-07-21 DIAGNOSIS — R079 Chest pain, unspecified: Principal | ICD-10-CM | POA: Insufficient documentation

## 2011-07-21 DIAGNOSIS — E669 Obesity, unspecified: Secondary | ICD-10-CM | POA: Insufficient documentation

## 2011-07-21 DIAGNOSIS — I2789 Other specified pulmonary heart diseases: Secondary | ICD-10-CM | POA: Insufficient documentation

## 2011-07-21 DIAGNOSIS — E785 Hyperlipidemia, unspecified: Secondary | ICD-10-CM | POA: Diagnosis present

## 2011-07-21 HISTORY — DX: Angina pectoris, unspecified: I20.9

## 2011-07-21 HISTORY — DX: Cardiac murmur, unspecified: R01.1

## 2011-07-21 HISTORY — DX: Dyspnea, unspecified: R06.00

## 2011-07-21 HISTORY — DX: Essential (primary) hypertension: I10

## 2011-07-21 HISTORY — DX: Other amnesia: R41.3

## 2011-07-21 HISTORY — DX: Fibromyalgia: M79.7

## 2011-07-21 HISTORY — DX: Other complications of anesthesia, initial encounter: T88.59XA

## 2011-07-21 HISTORY — DX: Gastro-esophageal reflux disease without esophagitis: K21.9

## 2011-07-21 HISTORY — DX: Cerebral infarction, unspecified: I63.9

## 2011-07-21 HISTORY — DX: Restless legs syndrome: G25.81

## 2011-07-21 HISTORY — DX: Malignant neoplasm of accessory sinus, unspecified: C31.9

## 2011-07-21 HISTORY — DX: Pure hypercholesterolemia, unspecified: E78.00

## 2011-07-21 HISTORY — DX: Unspecified osteoarthritis, unspecified site: M19.90

## 2011-07-21 HISTORY — DX: Transient cerebral ischemic attack, unspecified: G45.9

## 2011-07-21 HISTORY — DX: Calculus of kidney: N20.0

## 2011-07-21 HISTORY — DX: Adverse effect of unspecified anesthetic, initial encounter: T41.45XA

## 2011-07-21 HISTORY — DX: Other forms of dyspnea: R06.09

## 2011-07-21 LAB — CBC
HCT: 36.6 % (ref 36.0–46.0)
MCHC: 34.2 g/dL (ref 30.0–36.0)
MCV: 90.8 fL (ref 78.0–100.0)
RDW: 13.5 % (ref 11.5–15.5)

## 2011-07-21 LAB — CK TOTAL AND CKMB (NOT AT ARMC)
CK, MB: 2.2 ng/mL (ref 0.3–4.0)
Relative Index: INVALID (ref 0.0–2.5)
Total CK: 83 U/L (ref 7–177)

## 2011-07-21 LAB — BASIC METABOLIC PANEL
Chloride: 99 mEq/L (ref 96–112)
GFR calc non Af Amer: 68 mL/min — ABNORMAL LOW (ref >90)
Glucose, Bld: 100 mg/dL — ABNORMAL HIGH (ref 70–99)
Potassium: 4.2 mEq/L (ref 3.5–5.1)
Sodium: 137 mEq/L (ref 135–145)

## 2011-07-21 LAB — POCT I-STAT, CHEM 8
Calcium, Ion: 1.19 mmol/L (ref 1.12–1.32)
Glucose, Bld: 103 mg/dL — ABNORMAL HIGH (ref 70–99)
HCT: 38 % (ref 36.0–46.0)
Hemoglobin: 12.9 g/dL (ref 12.0–15.0)
Potassium: 4 mEq/L (ref 3.5–5.1)

## 2011-07-21 LAB — POCT I-STAT TROPONIN I: Troponin i, poc: 0.01 ng/mL (ref 0.00–0.08)

## 2011-07-21 LAB — CARDIAC PANEL(CRET KIN+CKTOT+MB+TROPI)
CK, MB: 2 ng/mL (ref 0.3–4.0)
Relative Index: INVALID (ref 0.0–2.5)
Total CK: 80 U/L (ref 7–177)
Troponin I: <0.3 ng/mL (ref <0.30)

## 2011-07-21 LAB — DIFFERENTIAL
Eosinophils Absolute: 0.2 10*3/uL (ref 0.0–0.7)
Eosinophils Relative: 2 % (ref 0–5)
Monocytes Absolute: 0.7 10*3/uL (ref 0.1–1.0)

## 2011-07-21 MED ORDER — AMLODIPINE BESYLATE 10 MG PO TABS
10.0000 mg | ORAL_TABLET | Freq: Every day | ORAL | Status: DC
  Administered 2011-07-21 – 2011-07-22 (×2): 10 mg via ORAL
  Filled 2011-07-21 (×2): qty 1

## 2011-07-21 MED ORDER — MEMANTINE HCL 10 MG PO TABS
10.0000 mg | ORAL_TABLET | Freq: Two times a day (BID) | ORAL | Status: DC
  Administered 2011-07-21 – 2011-07-22 (×2): 10 mg via ORAL
  Filled 2011-07-21 (×3): qty 1

## 2011-07-21 MED ORDER — ATORVASTATIN CALCIUM 20 MG PO TABS
20.0000 mg | ORAL_TABLET | Freq: Every day | ORAL | Status: DC
  Administered 2011-07-21: 20 mg via ORAL
  Filled 2011-07-21 (×2): qty 1

## 2011-07-21 MED ORDER — ACETAMINOPHEN 325 MG PO TABS
650.0000 mg | ORAL_TABLET | ORAL | Status: DC | PRN
  Administered 2011-07-21 – 2011-07-22 (×2): 650 mg via ORAL
  Filled 2011-07-21 (×2): qty 2

## 2011-07-21 MED ORDER — NITROGLYCERIN 0.4 MG SL SUBL: 0.4000 mg | SUBLINGUAL_TABLET | SUBLINGUAL | Status: DC | PRN

## 2011-07-21 MED ORDER — TOLTERODINE TARTRATE ER 4 MG PO CP24
4.0000 mg | ORAL_CAPSULE | Freq: Every day | ORAL | Status: DC | PRN
  Filled 2011-07-21: qty 1

## 2011-07-21 MED ORDER — ROPINIROLE HCL 0.5 MG PO TABS
0.5000 mg | ORAL_TABLET | Freq: Every day | ORAL | Status: DC
  Administered 2011-07-21: 0.5 mg via ORAL
  Filled 2011-07-21 (×2): qty 1

## 2011-07-21 MED ORDER — ASPIRIN EC 81 MG PO TBEC
81.0000 mg | DELAYED_RELEASE_TABLET | Freq: Every day | ORAL | Status: DC
  Administered 2011-07-22: 81 mg via ORAL
  Filled 2011-07-21: qty 1

## 2011-07-21 MED ORDER — CLOPIDOGREL BISULFATE 75 MG PO TABS
75.0000 mg | ORAL_TABLET | Freq: Every day | ORAL | Status: DC
  Administered 2011-07-21 – 2011-07-22 (×2): 75 mg via ORAL
  Filled 2011-07-21 (×2): qty 1

## 2011-07-21 MED ORDER — ASPIRIN 81 MG PO CHEW: 324.0000 mg | CHEWABLE_TABLET | Freq: Once | ORAL | Status: DC

## 2011-07-21 MED ORDER — CITALOPRAM HYDROBROMIDE 40 MG PO TABS
40.0000 mg | ORAL_TABLET | Freq: Every day | ORAL | Status: DC
  Administered 2011-07-21 – 2011-07-22 (×2): 40 mg via ORAL
  Filled 2011-07-21 (×2): qty 1

## 2011-07-21 MED ORDER — ONDANSETRON HCL 4 MG/2ML IJ SOLN
4.0000 mg | Freq: Four times a day (QID) | INTRAMUSCULAR | Status: DC | PRN
  Administered 2011-07-22: 4 mg via INTRAVENOUS

## 2011-07-21 MED ORDER — ALPRAZOLAM 0.5 MG PO TABS: 0.5000 mg | ORAL_TABLET | Freq: Three times a day (TID) | ORAL | Status: DC | PRN

## 2011-07-21 MED ORDER — GI COCKTAIL ~~LOC~~: 30.0000 mL | Freq: Two times a day (BID) | ORAL | Status: DC | PRN

## 2011-07-21 NOTE — ED Provider Notes (Signed)
I saw and evaluated the patient, reviewed the resident's note and I agree with the findings and plan. ekg interpretation (reviewed by me).  Presents with intermittent chest pain. Resolved on arrival to the emergency department after nitroglycerin just prior to arrival. She was recently seen by cardiology and had an outpatient workup scheduled at this time. EKG is nonischemic. She is hemodynamically stable. Her first troponin is negative. She's been evaluated by cardiology and will be admitted for further workup and evaluation.   Forbes Cellar, MD 07/21/11 [phone]

## 2011-07-21 NOTE — ED Notes (Signed)
Patient resting comfortably on stretcher. Explained plan of care verbalized understanding.  Two family members at bedside.

## 2011-07-21 NOTE — ED Notes (Signed)
Pt states  Her husband has alz has been upstairs since Monday. She has not been feel well for a few weeks and cp started this am she at first thout it was acid reflux but was told by [facility] to come to er if she had chest pain so she took a nitro and called ems pain went away the nitro

## 2011-07-21 NOTE — ED Notes (Signed)
Patient ambulated to bathroom and returned steady gait denies chest pain or shortness of breath. States feels better. 2 family members at bedside.

## 2011-07-21 NOTE — ED Notes (Signed)
Cardiology at bedside.

## 2011-07-21 NOTE — ED Provider Notes (Signed)
History     CSN: [id number]  Arrival date & time 07/21/11  9562   First MD Initiated Contact with Patient 07/21/11 [phone]      Chief Complaint  Patient presents with  . Chest Pain    (Consider location/radiation/quality/duration/timing/severity/associated sxs/prior treatment) Patient is a 78 y.o. female presenting with chest pain. The history is provided by the patient and a relative.  Chest Pain The chest pain began 1 - 2 hours ago. Chest pain occurs intermittently. The chest pain is resolved. The pain is associated with stress (light activity). At its most intense, the pain is at 6/10. The pain is currently at 0/10. The quality of the pain is described as burning and heavy. The pain radiates to the right jaw and left jaw. Primary symptoms include fatigue, shortness of breath and abdominal pain. Pertinent negatives for primary symptoms include no syncope, no cough, no nausea, no vomiting and no dizziness.  Pertinent negatives for associated symptoms include no lower extremity edema. She tried nitroglycerin and aspirin for the symptoms. Risk factors include stress and being elderly.  Her past medical history is significant for hyperlipidemia, hypertension and TIA.  Pertinent negatives for past medical history include no CAD and no MI.  Procedure history is positive for cardiac catheterization (>10y ago).     Past Medical History  Diagnosis Date  . Hypertension   . Arthritis   . High cholesterol   . Acid reflux   . TIA (transient ischemic attack)   . Stroke     blood clots in brain    No past surgical history on file.  No family history on file.  History  Substance Use Topics  . Smoking status: Never Smoker   . Smokeless tobacco: Not on file  . Alcohol Use: Yes    OB History    Grav Para Term Preterm Abortions TAB SAB Ect Mult Living                  Review of Systems  Constitutional: Positive for fatigue.  HENT: Negative for neck pain.   Respiratory: Positive for  shortness of breath. Negative for cough and chest tightness.   Cardiovascular: Positive for chest pain. Negative for syncope.  Gastrointestinal: Positive for abdominal pain. Negative for nausea, vomiting and diarrhea.  Genitourinary: Negative for dysuria.  Skin: Negative for rash.  Neurological: Negative for dizziness and headaches.  All other systems reviewed and are negative.    Allergies  Iohexol  Home Medications   Current Outpatient Rx  Name Route Sig Dispense Refill  . ACETAMINOPHEN 500 MG PO TABS Oral Take 1,000 mg by mouth every 6 (six) hours as needed. For pain    . ALPRAZOLAM 0.5 MG PO TABS Oral Take 0.5 mg by mouth every 8 (eight) hours as needed. For anxiety    . AMLODIPINE BESYLATE 10 MG PO TABS Oral Take 10 mg by mouth daily.    Marland Kitchen CITALOPRAM HYDROBROMIDE 40 MG PO TABS Oral Take 40 mg by mouth daily.    Marland Kitchen MEMANTINE HCL 10 MG PO TABS Oral Take 10 mg by mouth 2 (two) times daily.    Marland Kitchen NITROGLYCERIN 0.4 MG SL SUBL Sublingual Place 0.4 mg under the tongue every 5 (five) minutes as needed. For chest pain    . ROPINIROLE HCL 0.5 MG PO TABS Oral Take 0.5 mg by mouth at bedtime.    Marland Kitchen ROSUVASTATIN CALCIUM 10 MG PO TABS Oral Take 10 mg by mouth daily.    . TOLTERODINE  TARTRATE ER 4 MG PO CP24 Oral Take 4 mg by mouth daily as needed. For kidneys    . CLOPIDOGREL BISULFATE 75 MG PO TABS Oral Take 75 mg by mouth daily.      BP 187/82  Pulse 78  Temp(Src) 97.5 F (36.4 C) (Oral)  Resp 17  SpO2 98%  Physical Exam  Nursing note and vitals reviewed. Constitutional: She is oriented to person, place, and time. She appears well-developed and well-nourished.  HENT:  Head: Normocephalic and atraumatic.  Eyes: EOM are normal.  Neck: Normal range of motion.  Cardiovascular: Normal rate, regular rhythm and normal heart sounds.   Pulmonary/Chest: Effort normal and breath sounds normal. No respiratory distress.  Abdominal: Soft. There is no tenderness.  Musculoskeletal: Normal range  of motion. She exhibits no edema and no tenderness.  Neurological: She is alert and oriented to person, place, and time.  Skin: Skin is warm and dry.  Psychiatric: She has a normal mood and affect.    ED Course  Procedures (including critical care time)  Date: 07/21/2011  Rate: 80  Rhythm: normal sinus rhythm  QRS Axis: normal  Intervals: normal  ST/T Wave abnormalities: normal  Conduction Disutrbances:none  Narrative Interpretation:   Old EKG Reviewed: unchanged   Labs Reviewed  BASIC METABOLIC PANEL - Abnormal; Notable for the following:    Glucose, Bld 100 (*)    GFR calc non Af Amer 68 (*)    GFR calc Af Amer 79 (*)    All other components within normal limits  POCT I-STAT, CHEM 8 - Abnormal; Notable for the following:    Glucose, Bld 103 (*)    All other components within normal limits  CBC  DIFFERENTIAL  TROPONIN I  CK TOTAL AND CKMB  POCT I-STAT TROPONIN I   Dg Chest Portable 1 View  07/21/2011  *RADIOLOGY REPORT*  Clinical Data: Chest pain and shortness of breath.  PORTABLE CHEST - 1 VIEW  Comparison: Chest x-ray 01/01/2010.  Findings: Lung volumes are normal.  No consolidative airspace disease.  No definite pleural effusions.  Pulmonary vasculature is normal.  Heart size is borderline enlarged (likely accentuated by patients rotation to the right). The patient is rotated to the right on today's exam, resulting in distortion of the mediastinal contours and reduced diagnostic sensitivity and specificity for mediastinal pathology.  Atherosclerotic calcifications within the arch of the aorta.  IMPRESSION: 1.  No radiographic evidence of acute cardiopulmonary disease. 2.  Atherosclerosis.  Original Report Authenticated By: Florencia Reasons, M.D.     1. Chest pain       MDM   Pt with h/o HTN, HLD, TIA presents with chest pain. Began with light activity this am, associated with jaw pain, h/a, dyspnea. Has resolved after NTG from home but still feels "funny in the  head." Just yesterday saw cards at [facility] with plans for outpt echo, stress given intermittent episodes like his over several weeks.   Also consider GI etiology given her reports hx of epigastric burning and sour taste in mouth (often w/ lying flat).   Labs and CXR benign. Consult from [facility] who will admit and plan inpatient stress.     Donnamarie Poag, MD 07/21/11 [phone]

## 2011-07-21 NOTE — ED Notes (Signed)
Patient resting comfortably on stretcher. Currently Cardiology spoke with patient and another member will speak with patient shortly.

## 2011-07-21 NOTE — ED Notes (Signed)
[id number] 

## 2011-07-21 NOTE — H&P (Signed)
Amanda Hogan is an 78 y.o. female.   Chief Complaint: Chest pain HPI:  The patient is an 78 year old Caucasian female with a history of hypertension, hyperlipidemia, gastroesophageal reflux, recurrent TIAs dating back to 1999, history of noncompliance with medications, lumbar laminectomy per Dr. Newell Coral in 2007, microscopic cholecystectomy in 2005, chemotherapy for sinus lymphoma.. She had been hospitalized in October of 2001 for chest pain was noted to have improvement after GI cocktail was administered.  The patient was just seen by Dr. Alanda Amass yesterday May 1 because of chest pain that has been going on for several weeks.  The plan was to do echocardiogram and nuclear stress test.  Patient lacked last 3 echocardiogram was in 2011 which showed good systolic function mild to moderate aortic insufficiency, elevated PA pressure 48 mm mercury and no significant valve disease.  The patient presents this morning because she had an exacerbation of chest pain which he describes as a burning sensation as well as pressure-like on the left side which made it heavy to breathe. She also had extreme discomfort in her gums as well as severe headache.  The night before she reports having some acid reflux as well as palpitations and feeling "jittery". She also complains of nocturia getting up 4-6 times per night.  She is apparently treated for overactive bladder.?  Patient states she tried one nitroglycerin sublingual and her chest pain went from a 5/10 to approximately 3/10 this took approximately 10 minutes improvement.  She denies nausea, vomiting, cough, congestion, blurry vision, double vision, fever, sore throat, abdominal pain, dysuria, hematuria, constipation, diarrhea.  Patient's husband, she has been married to for approximately 63 years, is currently admitted to [facility]. He has a history of Alzheimer's. The patient has been quite worried about him. Apparently he is being discharged to [facility].  EKG  shows normal sinus rhythm with no acute changes.  Medications:    Aricept 5 mg, citalopram 40 mg daily, Plavix 75 mg daily, Norvasc 10 mg daily, omeprazole 20 mg daily, Crestor 20 mg daily, Detrol milligrams daily, Requip 0.5 mg at bedtime, bellow Benicar/HCT 20/12.5 mg daily, Zantac, nitroglycerin sublingual 0.4 mg as needed.  Past Medical History  Diagnosis Date  . Hypertension   . Arthritis   . High cholesterol   . Acid reflux   . TIA (transient ischemic attack)   . Stroke     blood clots in brain    No past surgical history on file.  No family history on file. Social History:  reports that she has never smoked. She does not have any smokeless tobacco history on file. She reports that she drinks alcohol. Her drug history not on file.  Allergies:  Allergies  Allergen Reactions  . Iohexol      Desc: ITCHING/RESPIRATORY DISTRESS      (Not in a hospital admission)  Results for orders placed during the hospital encounter of 07/21/11 (from the past 48 hour(s))  CBC     Status: Normal   Collection Time   07/21/11  9:37 AM      Component Value Range Comment   WBC 10.2  4.0 - 10.5 (K/uL)    RBC 4.03  3.87 - 5.11 (MIL/uL)    Hemoglobin 12.5  12.0 - 15.0 (g/dL)    HCT 16.1  09.6 - 04.5 (%)    MCV 90.8  78.0 - 100.0 (fL)    MCH 31.0  26.0 - 34.0 (pg)    MCHC 34.2  30.0 - 36.0 (g/dL)  RDW 13.5  11.5 - 15.5 (%)    Platelets 277  150 - 400 (K/uL)   DIFFERENTIAL     Status: Normal   Collection Time   07/21/11  9:37 AM      Component Value Range Comment   Neutrophils Relative 75  43 - 77 (%)    Neutro Abs 7.7  1.7 - 7.7 (K/uL)    Lymphocytes Relative 16  12 - 46 (%)    Lymphs Abs 1.6  0.7 - 4.0 (K/uL)    Monocytes Relative 7  3 - 12 (%)    Monocytes Absolute 0.7  0.1 - 1.0 (K/uL)    Eosinophils Relative 2  0 - 5 (%)    Eosinophils Absolute 0.2  0.0 - 0.7 (K/uL)    Basophils Relative 0  0 - 1 (%)    Basophils Absolute 0.0  0.0 - 0.1 (K/uL)   BASIC METABOLIC PANEL      Status: Abnormal   Collection Time   07/21/11  9:37 AM      Component Value Range Comment   Sodium 137  135 - 145 (mEq/L)    Potassium 4.2  3.5 - 5.1 (mEq/L)    Chloride 99  96 - 112 (mEq/L)    CO2 25  19 - 32 (mEq/L)    Glucose, Bld 100 (*) 70 - 99 (mg/dL)    BUN 19  6 - 23 (mg/dL)    Creatinine, Ser 1.61  0.50 - 1.10 (mg/dL)    Calcium 9.4  8.4 - 10.5 (mg/dL)    GFR calc non Af Amer 68 (*) >90 (mL/min)    GFR calc Af Amer 79 (*) >90 (mL/min)   TROPONIN I     Status: Normal   Collection Time   07/21/11  9:38 AM      Component Value Range Comment   Troponin I <0.30  <0.30 (ng/mL)   CK TOTAL AND CKMB     Status: Normal   Collection Time   07/21/11  9:38 AM      Component Value Range Comment   Total CK 83  7 - 177 (U/L)    CK, MB 2.2  0.3 - 4.0 (ng/mL)    Relative Index RELATIVE INDEX IS INVALID  0.0 - 2.5    POCT I-STAT TROPONIN I     Status: Normal   Collection Time   07/21/11  9:55 AM      Component Value Range Comment   Troponin i, poc 0.01  0.00 - 0.08 (ng/mL)    Comment 3            POCT I-STAT, CHEM 8     Status: Abnormal   Collection Time   07/21/11  9:57 AM      Component Value Range Comment   Sodium 139  135 - 145 (mEq/L)    Potassium 4.0  3.5 - 5.1 (mEq/L)    Chloride 103  96 - 112 (mEq/L)    BUN 21  6 - 23 (mg/dL)    Creatinine, Ser 0.96  0.50 - 1.10 (mg/dL)    Glucose, Bld 045 (*) 70 - 99 (mg/dL)    Calcium, Ion 4.09  1.12 - 1.32 (mmol/L)    TCO2 29  0 - 100 (mmol/L)    Hemoglobin 12.9  12.0 - 15.0 (g/dL)    HCT 81.1  91.4 - 78.2 (%)    Dg Chest Portable 1 View  07/21/2011  *RADIOLOGY REPORT*  Clinical Data: Chest pain and  shortness of breath.  PORTABLE CHEST - 1 VIEW  Comparison: Chest x-ray 01/01/2010.  Findings: Lung volumes are normal.  No consolidative airspace disease.  No definite pleural effusions.  Pulmonary vasculature is normal.  Heart size is borderline enlarged (likely accentuated by patients rotation to the right). The patient is rotated to the right on  today's exam, resulting in distortion of the mediastinal contours and reduced diagnostic sensitivity and specificity for mediastinal pathology.  Atherosclerotic calcifications within the arch of the aorta.  IMPRESSION: 1.  No radiographic evidence of acute cardiopulmonary disease. 2.  Atherosclerosis.  Original Report Authenticated By: Florencia Reasons, M.D.    Review of Systems  Constitutional: Negative for fever and diaphoresis.  HENT: Positive for neck pain. Negative for congestion and sore throat.   Eyes: Negative for blurred vision and double vision.  Respiratory: Positive for shortness of breath (heaviness). Negative for cough.   Cardiovascular: Positive for chest pain and palpitations. Negative for orthopnea, leg swelling and PND.  Gastrointestinal: Positive for heartburn. Negative for nausea, vomiting, diarrhea and constipation.  Genitourinary: Negative for dysuria and hematuria.       Nocturia  Neurological: Positive for headaches. Negative for dizziness.    Blood pressure 147/76, pulse 85, temperature 98.4 F (36.9 C), temperature source Oral, resp. rate 20, SpO2 98.00%. Physical Exam  Constitutional: She is oriented to person, place, and time. No distress.       Frail-appearing female, overweight  HENT:  Head: Normocephalic and atraumatic.  Eyes: EOM are normal. Pupils are equal, round, and reactive to light. No scleral icterus.  Neck: Normal range of motion. Neck supple. No JVD present.  Cardiovascular: Normal rate and regular rhythm.   Murmur heard.  Systolic murmur is present with a grade of 2/6  Pulses:      Radial pulses are 2+ on the right side, and 2+ on the left side.       Dorsalis pedis pulses are 2+ on the right side, and 2+ on the left side.       No carotid bruits. 2/6 systolic murmur at LSB and in the axilla  Respiratory: Effort normal and breath sounds normal. She has no wheezes. She has no rales. She exhibits no tenderness.  GI: Soft. Bowel sounds are  normal. She exhibits no distension. There is no tenderness.  Musculoskeletal:       No lower extremity edema  Lymphadenopathy:    She has no cervical adenopathy.  Neurological: She is alert and oriented to person, place, and time. She exhibits normal muscle tone.  Skin: Skin is warm and dry.  Psychiatric: She has a normal mood and affect.     Assessment/Plan Patient Active Hospital Problem List: Chest pain (07/21/2011) Hypertension (07/21/2011) Hyperlipidemia (07/21/2011) Gastroesophageal reflux disease (07/21/2011) History of TIA (transient ischemic attack) (07/21/2011) Obesity (07/21/2011) Pulmonary hypertension  Plan:  The patient will be admitted to a telemetry bed. We will rule out acute coronary syndrome by cycling cardiac enzymes. We'll continue with the original outpatient plan with [facility] tomorrow. We will add GI cocktail for further reflux symptoms along with protonix.  Continue other appropriate home medications.  SCDs for DVT prophylaxis.    HAGER,BRYAN W 07/21/2011, 12:41 PM  Patient seen and examined. Agree with assessment and plan.Very pleasant 78 yo WK with h/o HTN, hyperlipidemia, GERD, remote TIA, obesity, mild pulmonary HTN who is now admitted with recurrent chest pain. She saw Dr. Alanda Amass yesterday in our office with similar symptoms.  Today due to increasing  symptoms she presents to ER for evaluation. Initial ECG without acute changes. CXR shows aortic arch calcification.  No chest wall tenderness. She also notes GERD symtoms. Agree with above. Will obtain serial enzymes and ECG and schedule for lexiscan myoview tomorrow to evaluate for ischemia.   Lennette Bihari, MD, [facility] 07/21/2011 1:20 PM

## 2011-07-21 NOTE — ED Notes (Signed)
Food tray given to patient daughter at bedside.

## 2011-07-21 NOTE — ED Notes (Signed)
Had been feeling bad w/ cp x 3 weeks  Cp started today  after 6am  Took 1 nitro after s/s onset had just seen her dr yesterday and cards dr placed on new meds has not taken them as she should cause her husband is upstairs given  2 baby asa for total of 325 has had 18 iv l ac

## 2011-07-21 NOTE — ED Notes (Signed)
Called nurse on floor and currently assisting another patient will call back shortly.

## 2011-07-22 ENCOUNTER — Encounter (HOSPITAL_COMMUNITY): Payer: Self-pay | Admitting: Cardiology

## 2011-07-22 ENCOUNTER — Inpatient Hospital Stay (HOSPITAL_COMMUNITY): Payer: Medicare Other

## 2011-07-22 DIAGNOSIS — R413 Other amnesia: Secondary | ICD-10-CM

## 2011-07-22 HISTORY — DX: Other amnesia: R41.3

## 2011-07-22 LAB — BASIC METABOLIC PANEL
Chloride: 100 mEq/L (ref 96–112)
GFR calc Af Amer: 77 mL/min — ABNORMAL LOW (ref >90)
GFR calc non Af Amer: 67 mL/min — ABNORMAL LOW (ref >90)
Glucose, Bld: 102 mg/dL — ABNORMAL HIGH (ref 70–99)
Potassium: 4.3 mEq/L (ref 3.5–5.1)
Sodium: 138 mEq/L (ref 135–145)

## 2011-07-22 LAB — CARDIAC PANEL(CRET KIN+CKTOT+MB+TROPI)
Relative Index: INVALID (ref 0.0–2.5)
Relative Index: INVALID (ref 0.0–2.5)
Total CK: 89 U/L (ref 7–177)
Troponin I: <0.3 ng/mL (ref <0.30)

## 2011-07-22 LAB — CBC
HCT: 36.3 % (ref 36.0–46.0)
Hemoglobin: 12 g/dL (ref 12.0–15.0)
MCHC: 33.1 g/dL (ref 30.0–36.0)
RDW: 13.5 % (ref 11.5–15.5)
WBC: 7.5 10*3/uL (ref 4.0–10.5)

## 2011-07-22 LAB — LIPID PANEL
HDL: 66 mg/dL (ref >39)
Total CHOL/HDL Ratio: 2.6 RATIO
Triglycerides: 117 mg/dL (ref <150)

## 2011-07-22 LAB — HEMOGLOBIN A1C: Mean Plasma Glucose: 128 mg/dL — ABNORMAL HIGH (ref <117)

## 2011-07-22 MED ORDER — ALUM & MAG HYDROXIDE-SIMETH 200-200-20 MG/5ML PO SUSP
30.0000 mL | ORAL | Status: DC | PRN
  Filled 2011-07-22: qty 30

## 2011-07-22 MED ORDER — MORPHINE SULFATE 4 MG/ML IJ SOLN
INTRAMUSCULAR | Status: AC
  Filled 2011-07-22: qty 1

## 2011-07-22 MED ORDER — METOPROLOL TARTRATE 1 MG/ML IV SOLN
2.5000 mg | Freq: Once | INTRAVENOUS | Status: AC
  Administered 2011-07-22: 2.5 mg via INTRAVENOUS

## 2011-07-22 MED ORDER — PANTOPRAZOLE SODIUM 40 MG PO TBEC: 40.0000 mg | DELAYED_RELEASE_TABLET | Freq: Every day | ORAL | Status: DC

## 2011-07-22 MED ORDER — TECHNETIUM TC 99M TETROFOSMIN IV KIT
10.0000 | PACK | Freq: Once | INTRAVENOUS | Status: AC | PRN
  Administered 2011-07-22: 10 via INTRAVENOUS

## 2011-07-22 MED ORDER — ONDANSETRON HCL 4 MG/2ML IJ SOLN: 4.0000 mg | Freq: Once | INTRAMUSCULAR | Status: DC

## 2011-07-22 MED ORDER — ONDANSETRON HCL 4 MG/2ML IJ SOLN
INTRAMUSCULAR | Status: AC
  Filled 2011-07-22: qty 2

## 2011-07-22 MED ORDER — TECHNETIUM TC 99M TETROFOSMIN IV KIT
30.0000 | PACK | Freq: Once | INTRAVENOUS | Status: AC | PRN
  Administered 2011-07-22: 30 via INTRAVENOUS

## 2011-07-22 MED ORDER — METOPROLOL TARTRATE 1 MG/ML IV SOLN
INTRAVENOUS | Status: AC
  Filled 2011-07-22: qty 5

## 2011-07-22 MED ORDER — REGADENOSON 0.4 MG/5ML IV SOLN
0.4000 mg | Freq: Once | INTRAVENOUS | Status: AC
  Administered 2011-07-22: 0.4 mg via INTRAVENOUS
  Filled 2011-07-22: qty 5

## 2011-07-22 MED ORDER — MORPHINE SULFATE 2 MG/ML IJ SOLN
2.0000 mg | Freq: Once | INTRAMUSCULAR | Status: AC
  Administered 2011-07-22: 2 mg via INTRAVENOUS

## 2011-07-22 NOTE — Progress Notes (Signed)
Subjective: No complaints prior to Nuc. Study.  Objective: Vital signs in last 24 hours: Temp:  [98.3 F (36.8 C)-98.7 F (37.1 C)] 98.3 F (36.8 C) (05/03 0500) Pulse Rate:  [72-103] 74  (05/03 1035) Resp:  [14-20] 18  (05/03 0500) BP: (143-230)/(64-107) 177/82 mmHg (05/03 1035) SpO2:  [9 %-99 %] 9 % (05/03 0702) Weight:  [87.4 kg (192 lb 10.9 oz)] 87.4 kg (192 lb 10.9 oz) (05/02 1623) Weight change:  Last BM Date: 07/21/11 Intake/Output from previous day: 05/02 0701 - 05/03 0700 In: 240 [P.O.:240] Out: 750 [Urine:750] Intake/Output this shift:    PE: General:alert and oriented, no complaints prior to study Heart:S1S2 RRR Lungs:clear without rales, rhonchi or wheezes Abd:+ BS soft, non tender Ext:no edema Neuro:A&O X 3, MAE   Lab Results:  Basename 07/22/11 0520 07/21/11 0957 07/21/11 0937  WBC 7.5 -- 10.2  HGB 12.0 12.9 --  HCT 36.3 38.0 --  PLT 276 -- 277   BMET  Basename 07/22/11 0520 07/21/11 0957 07/21/11 0937  NA 138 139 --  K 4.3 4.0 --  CL 100 103 --  CO2 29 -- 25  GLUCOSE 102* 103* --  BUN 17 21 --  CREATININE 0.81 1.10 --  CALCIUM 9.2 -- 9.4    Basename 07/22/11 0520 07/21/11 2333  TROPONINI <0.30 <0.30    Lab Results  Component Value Date   CHOL 173 07/22/2011   HDL 66 07/22/2011   LDLCALC 84 07/22/2011   TRIG 117 07/22/2011   CHOLHDL 2.6 07/22/2011   Lab Results  Component Value Date   HGBA1C 6.1* 07/21/2011     Lab Results  Component Value Date   TSH 3.951 01/01/2010    Hepatic Function Panel No results found for this basename: PROT,ALBUMIN,AST,ALT,ALKPHOS,BILITOT,BILIDIR,IBILI in the last 72 hours  Basename 07/22/11 0520  CHOL 173   No results found for this basename: PROTIME in the last 72 hours    EKG: Orders placed during the hospital encounter of 07/21/11  . EKG 12-LEAD  . EKG 12-LEAD  . EKG 12-LEAD  . EKG 12-LEAD    Studies/Results: Dg Chest Portable 1 View  07/21/2011  *RADIOLOGY REPORT*  Clinical Data: Chest pain and  shortness of breath.  PORTABLE CHEST - 1 VIEW  Comparison: Chest x-ray 01/01/2010.  Findings: Lung volumes are normal.  No consolidative airspace disease.  No definite pleural effusions.  Pulmonary vasculature is normal.  Heart size is borderline enlarged (likely accentuated by patients rotation to the right). The patient is rotated to the right on today's exam, resulting in distortion of the mediastinal contours and reduced diagnostic sensitivity and specificity for mediastinal pathology.  Atherosclerotic calcifications within the arch of the aorta.  IMPRESSION: 1.  No radiographic evidence of acute cardiopulmonary disease. 2.  Atherosclerosis.  Original Report Authenticated By: Florencia Reasons, M.D.    Medications: I have reviewed medications.    Marland Kitchen amLODipine  10 mg Oral Daily  . aspirin EC  81 mg Oral Daily  . atorvastatin  20 mg Oral q1800  . citalopram  40 mg Oral Daily  . clopidogrel  75 mg Oral Q breakfast  . memantine  10 mg Oral BID  . metoprolol      . metoprolol  2.5 mg Intravenous Once  .  morphine injection  2 mg Intravenous Once  . morphine      . ondansetron      . ondansetron  4 mg Intravenous Once  . regadenoson  0.4 mg Intravenous Once  .  rOPINIRole  0.5 mg Oral QHS    Assessment/Plan: Patient Active Problem List  Diagnoses  . Hypertension  . Chest pain  . Hyperlipidemia  . Gastroesophageal reflux disease  . History of TIA (transient ischemic attack)  . Obesity   PLAN: BP has been elevated throughout stay.  In radiology BP 187 systolic at beginning of test, then with symptoms of severe H/A BP up to 230 systolic.  IV Morphine, Zofran and due to continued elevated BP IV lopressor was given.  Symptoms resolved and BP down to 177 systolic.  Lexiscan myoview complete.  Cardiac enzymes negative.  The patient presented 07/21/11 because she had an exacerbation of chest pain which she described as a burning sensation as well as pressure-like on the left side which made it  heavy to breathe. She also had extreme discomfort in her gums as well as severe headache. The night before she reports having some acid reflux as well as palpitations and feeling "jittery".  Await Nuc results.    Will add med for HTN          LOS: 1 day   Myrtle Barnhard R 07/22/2011, 10:47 AM

## 2011-07-22 NOTE — Clinical Social Work Psychosocial (Signed)
     Clinical Social Work Department BRIEF PSYCHOSOCIAL ASSESSMENT 07/22/2011  Patient:  Amanda Hogan, Amanda Hogan     Account Number:  [id number]     Admit date:  07/21/2011  Clinical Social Worker:  Hulan Fray  Date/Time:  07/22/2011 01:36 PM  Referred by:  RN  Date Referred:  07/21/2011 Referred for  Advanced Directives   Other Referral:   Interview type:  Other - See comment Other interview type:   Patient and family    PSYCHOSOCIAL DATA Living Status:  HUSBAND Admitted from facility:   Level of care:   Primary support name:  Jerrye Beavers and Dorinda Hill Primary support relationship to patient:  CHILD, ADULT Degree of support available:   adequate    CURRENT CONCERNS  Other Concerns:    SOCIAL WORK ASSESSMENT / PLAN Clinical Social Worker received referral for advance directive request. Patient is married, but per chart review, husband is being admitted into [facility]. Patient had family in room during assessment. Patient stated that she already had a "POA" but could not clarify if it was a HCPOA. Patient's daughter-in-law in room believed patient already had completed a HCPOA document. CSW encouraged family to bring in a copy, if they had it. CSW gave patient advance directive packet and MOST form and explained the documents. Patient was agreeable to receiving packet. CSW explained where patient can get documents notarized if not done in the hospital.   Assessment/plan status:  No Further Intervention Required Other assessment/ plan:   Information/referral to community resources:   Advance directive packet  MOST form    PATIENTS/FAMILYS RESPONSE TO PLAN OF CARE: Patient and family were receptive to receiving the packet and MOST form.

## 2011-07-22 NOTE — Progress Notes (Signed)
Pt. Seen and examined. Agree with the NP/PA-C note as written.  Await NST results today. Hypertensive during the study today, vomited, + headache. May be related to lexiscan. May be all related to hypertensive crisis.  Chrystie Nose, MD, [facility] Attending Cardiologist [facility]

## 2011-07-22 NOTE — Progress Notes (Signed)
Pt in Nuc Med for Myoview Stress Test.  After injection of Lexiscan pt's bp increased, she complained of being "hot" and having a headache.  Per Nada Boozer NP, Zofran, Metoprolol and Morphine ordered and given.  Pt's symptoms had improved by 10:30

## 2011-07-22 NOTE — Progress Notes (Signed)
D/c orders received;IV removed with gauze on; pt remains in stable condition, pt meds and instructions reviewed and given to pt; pt d/c to home 

## 2011-07-22 NOTE — Discharge Instructions (Signed)
Heart healthy diet  Call for continuing episodes.

## 2011-07-23 NOTE — Discharge Summary (Signed)
Physician Discharge Summary  Patient ID: Amanda Hogan MRN: [id number] DOB/AGE: Jun 12, 1928 78 y.o.  Admit date: 07/21/2011 Discharge date: 07/22/2011  Discharge Diagnoses:  Principal Problem:  *Chest pain, negative MI,  Negative nuculear stress test, may be related to HTN Active Problems:  Hypertension, at times poorly controlled  Gastroesophageal reflux disease  History of TIA (transient ischemic attack)  Memory disorder, possibly not taking home meds at times  Hyperlipidemia  Obesity   Discharged Condition: good  Hospital Course: The patient is an 78 year old Caucasian female with a history of hypertension, hyperlipidemia, gastroesophageal reflux, recurrent TIAs, dating back to 1999, history of noncompliance with medications, lumbar laminectomy per Dr. Newell Hogan in 2007, microscopic cholecystectomy in 2005, chemotherapy for sinus lymphoma.. She had been hospitalized in October of 2001 for chest pain was noted to have improvement after GI cocktail was administered. The patient was just seen by Dr. Alanda Hogan yesterday, May 1, because of chest pain that has been going on for several weeks. The plan was to do echocardiogram and nuclear stress test. Patient's last 2D echocardiogram was in 2011 which showed good systolic function mild to moderate aortic insufficiency, elevated PA pressure 48 mm mercury and no significant valve disease.  The patient presents 07/21/11 because she had an exacerbation of chest pain which she described as a burning sensation as well as pressure-like on the left side which made it heavy to breathe. She also had extreme discomfort in her gums as well as severe headache. The night before she reports having some acid reflux as well as palpitations and feeling "jittery". She also complains of nocturia getting up 4-6 times per night. She is apparently treated for overactive bladder.? Patient states she tried one nitroglycerin sublingual and her chest pain went from a 5/10 to  approximately 3/10 this took approximately 10 minutes improvement. She denies nausea, vomiting, cough, congestion, blurry vision, double vision, fever, sore throat, abdominal pain, dysuria, hematuria, constipation, diarrhea. Patient's husband, she has been married to for approximately 63 years, is currently admitted to [facility]. He has a history of Alzheimer's. The patient has been quite worried about him. Apparently he is being discharged to [facility].   EKG showed normal sinus rhythm with no acute changes on admit.    Patient was admitted and to telemetry bed cardiac enzymes were ordered And plan was for nuclear stress test.  Cardiac enzymes by the next morning were negative.  Patient underwent Lexiscan Myoview with which she developed severe headache and significant hypertension.  These were treated with morphine, Zofran and 2.5 mg of IV Lopressor.  Symptoms resolved her Lexiscan Myoview was negative for ischemia.  She felt much better by the afternoon of 07/22/2011 in the leg without problems and was found to be stable.  She was discharged home and will follow up as an outpatient with Dr. Alanda Hogan.  Protonix was added to her medical regimen.   Consults: None  Significant Diagnostic Studies: Lexi scan myoview:   No evidence of myocardial ischemia or infarction.  2. Normal left ventricular wall motion.  3. Estimated Q G S ejection fraction 78%.   Discharge Exam: Blood pressure 131/66, pulse 77, temperature 98.4 F (36.9 C), temperature source Oral, resp. rate 20, height 5\' 4"  (1.626 m), weight 87.4 kg (192 lb 10.9 oz), SpO2 94.00%.   General:alert and oriented, no complaints prior to study  Heart:S1S2 RRR  Lungs:clear without rales, rhonchi or wheezes  Abd:+ BS soft, non tender  Ext:no edema  Neuro:A&O X 3, MAE  Disposition:  01-Home or Self Care   Medication List  As of 07/23/2011 12:56 PM   TAKE these medications         acetaminophen 500 MG tablet   Commonly known as:  TYLENOL   Take 1,000 mg by mouth every 6 (six) hours as needed. For pain      ALPRAZolam 0.5 MG tablet   Commonly known as: XANAX   Take 0.5 mg by mouth every 8 (eight) hours as needed. For anxiety      amLODipine 10 MG tablet   Commonly known as: NORVASC   Take 10 mg by mouth daily.      citalopram 40 MG tablet   Commonly known as: CELEXA   Take 40 mg by mouth daily.      clopidogrel 75 MG tablet   Commonly known as: PLAVIX   Take 75 mg by mouth daily.      memantine 10 MG tablet   Commonly known as: NAMENDA   Take 10 mg by mouth 2 (two) times daily.      nitroGLYCERIN 0.4 MG SL tablet   Commonly known as: NITROSTAT   Place 0.4 mg under the tongue every 5 (five) minutes as needed. For chest pain      olmesartan-hydrochlorothiazide 20-12.5 MG per tablet   Commonly known as: BENICAR HCT   Take 1 tablet by mouth daily.      pantoprazole 40 MG tablet   Commonly known as: PROTONIX   Take 1 tablet (40 mg total) by mouth daily.      rOPINIRole 0.5 MG tablet   Commonly known as: REQUIP   Take 0.5 mg by mouth at bedtime.      rosuvastatin 10 MG tablet   Commonly known as: CRESTOR   Take 10 mg by mouth daily.      tolterodine 4 MG 24 hr capsule   Commonly known as: DETROL LA   Take 4 mg by mouth daily as needed. For kidneys           Follow-up Information    Follow up with Amanda Rooks, MD on 08/11/2011. (at 10:00, we cancelled the June appointment)    Contact information:   [address]  [place] [address] [phone]         Discharge instructions: Heart healthy diet  Call for continuing episodes.   SignedLeone Hogan 07/23/2011, 12:56 PM  I saw Amanda Hogan the afternoon of her discharge.  Her Myoview was negative and her BP was improved.  Her chest pain could be explained as non-cardiac vs. Hypertension related endocardial ischemia.  She has been placed back on her home medications and we discussed  the importance of medical adherence.  Should her BP remain uncontrolled, the next class of medications to add would be beta blockers. She has ambulated and is stable for discharge.  Amanda Hogan, M.D., M.S. [facility] [address] [place], [place]  [address]  [phone] Pager # [phone]  07/25/2011 8:46 AM

## 2011-08-05 DIAGNOSIS — I779 Disorder of arteries and arterioles, unspecified: Secondary | ICD-10-CM | POA: Insufficient documentation

## 2011-08-05 HISTORY — DX: Disorder of arteries and arterioles, unspecified: I77.9

## 2011-09-06 ENCOUNTER — Other Ambulatory Visit: Payer: Self-pay | Admitting: Family Medicine

## 2011-09-06 ENCOUNTER — Ambulatory Visit
Admission: RE | Admit: 2011-09-06 | Discharge: 2011-09-06 | Disposition: A | Discharge status: Home / Self Care | Payer: Medicare Other | Source: Ambulatory Visit | Attending: Family Medicine | Admitting: Family Medicine

## 2011-09-06 DIAGNOSIS — M545 Low back pain: Secondary | ICD-10-CM

## 2011-09-06 DIAGNOSIS — M25559 Pain in unspecified hip: Secondary | ICD-10-CM

## 2012-02-23 ENCOUNTER — Inpatient Hospital Stay (HOSPITAL_COMMUNITY): Payer: Medicare Other

## 2012-02-23 ENCOUNTER — Emergency Department (HOSPITAL_COMMUNITY): Payer: Medicare Other

## 2012-02-23 ENCOUNTER — Encounter (HOSPITAL_COMMUNITY): Payer: Self-pay | Admitting: Emergency Medicine

## 2012-02-23 ENCOUNTER — Inpatient Hospital Stay (HOSPITAL_COMMUNITY)
Admission: EM | Admit: 2012-02-23 | Discharge: 2012-02-25 | DRG: 069 | Disposition: A | Discharge status: Home / Self Care | Payer: Medicare Other | Attending: Internal Medicine | Admitting: Internal Medicine

## 2012-02-23 DIAGNOSIS — K219 Gastro-esophageal reflux disease without esophagitis: Secondary | ICD-10-CM

## 2012-02-23 DIAGNOSIS — I10 Essential (primary) hypertension: Secondary | ICD-10-CM | POA: Diagnosis present

## 2012-02-23 DIAGNOSIS — D649 Anemia, unspecified: Secondary | ICD-10-CM | POA: Diagnosis present

## 2012-02-23 DIAGNOSIS — G9340 Encephalopathy, unspecified: Secondary | ICD-10-CM | POA: Diagnosis present

## 2012-02-23 DIAGNOSIS — F419 Anxiety disorder, unspecified: Secondary | ICD-10-CM | POA: Diagnosis present

## 2012-02-23 DIAGNOSIS — I359 Nonrheumatic aortic valve disorder, unspecified: Secondary | ICD-10-CM | POA: Diagnosis present

## 2012-02-23 DIAGNOSIS — R4182 Altered mental status, unspecified: Secondary | ICD-10-CM

## 2012-02-23 DIAGNOSIS — F411 Generalized anxiety disorder: Secondary | ICD-10-CM | POA: Diagnosis present

## 2012-02-23 DIAGNOSIS — M549 Dorsalgia, unspecified: Secondary | ICD-10-CM | POA: Diagnosis present

## 2012-02-23 DIAGNOSIS — Z8673 Personal history of transient ischemic attack (TIA), and cerebral infarction without residual deficits: Secondary | ICD-10-CM

## 2012-02-23 DIAGNOSIS — R269 Unspecified abnormalities of gait and mobility: Secondary | ICD-10-CM | POA: Diagnosis present

## 2012-02-23 DIAGNOSIS — G459 Transient cerebral ischemic attack, unspecified: Principal | ICD-10-CM | POA: Diagnosis present

## 2012-02-23 DIAGNOSIS — G8929 Other chronic pain: Secondary | ICD-10-CM | POA: Diagnosis present

## 2012-02-23 DIAGNOSIS — R413 Other amnesia: Secondary | ICD-10-CM | POA: Diagnosis present

## 2012-02-23 DIAGNOSIS — F0390 Unspecified dementia without behavioral disturbance: Secondary | ICD-10-CM | POA: Diagnosis present

## 2012-02-23 DIAGNOSIS — E785 Hyperlipidemia, unspecified: Secondary | ICD-10-CM | POA: Diagnosis present

## 2012-02-23 DIAGNOSIS — IMO0001 Reserved for inherently not codable concepts without codable children: Secondary | ICD-10-CM | POA: Diagnosis present

## 2012-02-23 HISTORY — DX: Anxiety disorder, unspecified: F41.9

## 2012-02-23 HISTORY — DX: Unspecified dementia without behavioral disturbance: F03.90

## 2012-02-23 HISTORY — DX: Unspecified dementia, unspecified severity, without behavioral disturbance, psychotic disturbance, mood disturbance, and anxiety: F03.90

## 2012-02-23 LAB — COMPREHENSIVE METABOLIC PANEL
AST: 24 U/L (ref 0–37)
Albumin: 3.9 g/dL (ref 3.5–5.2)
Chloride: 98 mEq/L (ref 96–112)
Creatinine, Ser: 0.81 mg/dL (ref 0.50–1.10)
Potassium: 3.7 mEq/L (ref 3.5–5.1)
Total Bilirubin: 0.4 mg/dL (ref 0.3–1.2)

## 2012-02-23 LAB — RAPID URINE DRUG SCREEN, HOSP PERFORMED
Amphetamines: NOT DETECTED
Opiates: POSITIVE — AB
Tetrahydrocannabinol: NOT DETECTED

## 2012-02-23 LAB — POCT I-STAT, CHEM 8
Chloride: 100 mEq/L (ref 96–112)
Creatinine, Ser: 0.9 mg/dL (ref 0.50–1.10)
Glucose, Bld: 96 mg/dL (ref 70–99)
HCT: 34 % — ABNORMAL LOW (ref 36.0–46.0)
Potassium: 3.8 mEq/L (ref 3.5–5.1)

## 2012-02-23 LAB — CBC
MCH: 30.3 pg (ref 26.0–34.0)
MCHC: 33.5 g/dL (ref 30.0–36.0)
MCHC: 33.7 g/dL (ref 30.0–36.0)
MCV: 89.8 fL (ref 78.0–100.0)
Platelets: 287 10*3/uL (ref 150–400)
Platelets: 264 10*3/uL (ref 150–400)
RBC: 4 MIL/uL (ref 3.87–5.11)
RDW: 13.5 % (ref 11.5–15.5)
RDW: 13.3 % (ref 11.5–15.5)

## 2012-02-23 LAB — DIFFERENTIAL
Basophils Absolute: 0 10*3/uL (ref 0.0–0.1)
Basophils Relative: 0 % (ref 0–1)
Neutro Abs: 5.4 10*3/uL (ref 1.7–7.7)
Neutrophils Relative %: 69 % (ref 43–77)

## 2012-02-23 LAB — URINALYSIS, ROUTINE W REFLEX MICROSCOPIC
Ketones, ur: NEGATIVE mg/dL
Nitrite: NEGATIVE
Protein, ur: NEGATIVE mg/dL
Urobilinogen, UA: 0.2 mg/dL (ref 0.0–1.0)

## 2012-02-23 LAB — PROTIME-INR: INR: 0.99 (ref 0.00–1.49)

## 2012-02-23 LAB — HEMOGLOBIN A1C: Mean Plasma Glucose: 128 mg/dL — ABNORMAL HIGH (ref <117)

## 2012-02-23 LAB — URINE MICROSCOPIC-ADD ON

## 2012-02-23 LAB — APTT: aPTT: 28 seconds (ref 24–37)

## 2012-02-23 LAB — LIPID PANEL
Cholesterol: 270 mg/dL — ABNORMAL HIGH (ref 0–200)
Total CHOL/HDL Ratio: 5.1 RATIO

## 2012-02-23 LAB — CREATININE, SERUM: Creatinine, Ser: 0.76 mg/dL (ref 0.50–1.10)

## 2012-02-23 MED ORDER — HYDRALAZINE HCL 20 MG/ML IJ SOLN
10.0000 mg | Freq: Four times a day (QID) | INTRAMUSCULAR | Status: DC | PRN
  Filled 2012-02-23: qty 0.5

## 2012-02-23 MED ORDER — MELOXICAM 7.5 MG PO TABS
7.5000 mg | ORAL_TABLET | Freq: Two times a day (BID) | ORAL | Status: DC
  Administered 2012-02-23 – 2012-02-25 (×4): 7.5 mg via ORAL
  Filled 2012-02-23 (×5): qty 1

## 2012-02-23 MED ORDER — ROPINIROLE HCL 0.5 MG PO TABS
0.5000 mg | ORAL_TABLET | Freq: Every day | ORAL | Status: DC
  Administered 2012-02-23 – 2012-02-24 (×2): 0.5 mg via ORAL
  Filled 2012-02-23 (×4): qty 1

## 2012-02-23 MED ORDER — CITALOPRAM HYDROBROMIDE 40 MG PO TABS
40.0000 mg | ORAL_TABLET | Freq: Every day | ORAL | Status: DC
  Administered 2012-02-24 – 2012-02-25 (×2): 40 mg via ORAL
  Filled 2012-02-23 (×2): qty 1

## 2012-02-23 MED ORDER — ONDANSETRON HCL 4 MG/2ML IJ SOLN
4.0000 mg | Freq: Four times a day (QID) | INTRAMUSCULAR | Status: DC | PRN
  Administered 2012-02-24: 4 mg via INTRAVENOUS
  Filled 2012-02-23: qty 2

## 2012-02-23 MED ORDER — ACETAMINOPHEN 650 MG RE SUPP: 650.0000 mg | RECTAL | Status: DC | PRN

## 2012-02-23 MED ORDER — ALPRAZOLAM 0.5 MG PO TABS
0.5000 mg | ORAL_TABLET | Freq: Three times a day (TID) | ORAL | Status: DC | PRN
  Administered 2012-02-23 – 2012-02-25 (×2): 0.5 mg via ORAL
  Filled 2012-02-23 (×2): qty 1

## 2012-02-23 MED ORDER — AMLODIPINE BESYLATE 10 MG PO TABS
10.0000 mg | ORAL_TABLET | Freq: Every day | ORAL | Status: DC
  Administered 2012-02-23 – 2012-02-25 (×3): 10 mg via ORAL
  Filled 2012-02-23 (×3): qty 1

## 2012-02-23 MED ORDER — CLOPIDOGREL BISULFATE 75 MG PO TABS
75.0000 mg | ORAL_TABLET | Freq: Every day | ORAL | Status: DC
  Administered 2012-02-24 – 2012-02-25 (×2): 75 mg via ORAL
  Filled 2012-02-23 (×2): qty 1

## 2012-02-23 MED ORDER — SODIUM CHLORIDE 0.9 % IV SOLN
INTRAVENOUS | Status: DC
  Administered 2012-02-23: 1000 mL via INTRAVENOUS

## 2012-02-23 MED ORDER — MEMANTINE HCL 10 MG PO TABS
10.0000 mg | ORAL_TABLET | Freq: Two times a day (BID) | ORAL | Status: DC
  Administered 2012-02-23 – 2012-02-25 (×4): 10 mg via ORAL
  Filled 2012-02-23 (×6): qty 1

## 2012-02-23 MED ORDER — NITROGLYCERIN 0.4 MG SL SUBL: 0.4000 mg | SUBLINGUAL_TABLET | SUBLINGUAL | Status: DC | PRN

## 2012-02-23 MED ORDER — HYDROCODONE-ACETAMINOPHEN 5-325 MG PO TABS
1.0000 | ORAL_TABLET | Freq: Four times a day (QID) | ORAL | Status: DC | PRN
  Administered 2012-02-23 – 2012-02-25 (×3): 1 via ORAL
  Filled 2012-02-23 (×3): qty 1

## 2012-02-23 MED ORDER — SODIUM CHLORIDE 0.9 % IV SOLN
INTRAVENOUS | Status: AC
  Administered 2012-02-23: 17:00:00 via INTRAVENOUS

## 2012-02-23 MED ORDER — ACETAMINOPHEN 325 MG PO TABS: 650.0000 mg | ORAL_TABLET | ORAL | Status: DC | PRN

## 2012-02-23 MED ORDER — SENNOSIDES-DOCUSATE SODIUM 8.6-50 MG PO TABS: 1.0000 | ORAL_TABLET | Freq: Every evening | ORAL | Status: DC | PRN

## 2012-02-23 MED ORDER — ENOXAPARIN SODIUM 40 MG/0.4ML ~~LOC~~ SOLN
40.0000 mg | SUBCUTANEOUS | Status: DC
  Administered 2012-02-23 – 2012-02-24 (×2): 40 mg via SUBCUTANEOUS
  Filled 2012-02-23 (×3): qty 0.4

## 2012-02-23 MED ORDER — PANTOPRAZOLE SODIUM 40 MG PO TBEC
40.0000 mg | DELAYED_RELEASE_TABLET | Freq: Every day | ORAL | Status: DC
  Administered 2012-02-24 – 2012-02-25 (×2): 40 mg via ORAL
  Filled 2012-02-23: qty 1

## 2012-02-23 MED ORDER — MORPHINE SULFATE 2 MG/ML IJ SOLN
1.0000 mg | INTRAMUSCULAR | Status: DC | PRN
  Administered 2012-02-23: 2 mg via INTRAVENOUS
  Administered 2012-02-23: 1 mg via INTRAVENOUS
  Administered 2012-02-24: 2 mg via INTRAVENOUS
  Filled 2012-02-23 (×3): qty 1

## 2012-02-23 MED ORDER — AMOXICILLIN-POT CLAVULANATE 875-125 MG PO TABS
1.0000 | ORAL_TABLET | Freq: Two times a day (BID) | ORAL | Status: DC
  Administered 2012-02-23 – 2012-02-25 (×4): 1 via ORAL
  Filled 2012-02-23 (×5): qty 1

## 2012-02-23 MED ORDER — LORAZEPAM 1 MG PO TABS
1.0000 mg | ORAL_TABLET | Freq: Once | ORAL | Status: AC
  Administered 2012-02-23: 1 mg via ORAL
  Filled 2012-02-23: qty 1

## 2012-02-23 MED ORDER — TRIMETHOPRIM 100 MG PO TABS
100.0000 mg | ORAL_TABLET | Freq: Every day | ORAL | Status: DC
  Administered 2012-02-23 – 2012-02-24 (×2): 100 mg via ORAL
  Filled 2012-02-23 (×3): qty 1

## 2012-02-23 NOTE — ED Notes (Signed)
CBG 94 

## 2012-02-23 NOTE — ED Provider Notes (Signed)
History     CSN: [id number]  Arrival date & time 02/23/12  1157   First MD Initiated Contact with Patient 02/23/12 1201      No chief complaint on file.   (Consider location/radiation/quality/duration/timing/severity/associated sxs/prior treatment) HPI Comments: Pt is an 78 yo woman who was apparently spoke to her family on the telephone earlier today and seemed normal, then when they came to visit about an hour ago she was "talking funny,"  And seemed confused.  She has a history of TIA's and of hypertension.  EMS was called, and they found her to be coherant in her speech and had no apparent focal abnormality, so they did not call a Code Stroke in the field.  Patient is a 78 y.o. female presenting with altered mental status. The history is provided by the patient and the EMS personnel. No language interpreter was used.  Altered Mental Status This is a new problem. The current episode started 1 to 2 hours ago. The problem occurs rarely. The problem has been rapidly improving. Associated symptoms comments: None . Nothing aggravates the symptoms. Nothing relieves the symptoms. Treatments tried: To [facility] ED via EMS.      Past Medical History  Diagnosis Date  . Hypertension   . Arthritis   . High cholesterol   . Acid reflux   . TIA (transient ischemic attack)   . Restless leg syndrome   . Complication of anesthesia     "w/my back; gave me too much; thought I'd had stroke but didn't"  . Heart murmur   . Stroke     "said it looked like bullet holes; had a bunch"  . Angina   . Exertional dyspnea   . Sinus malignant neoplasm   . Kidney stone   . Fibromyalgia   . Memory disorder, possibly not taking home meds at times 07/22/2011    Past Surgical History  Procedure Date  . Cholecystectomy   . Vaginal hysterectomy   . Breast cyst excision     left  . Hernia repair     "stomach"  . Lung surgery     "made 3 holes and pulled gel out"  . Cardiac catheterization   . Knee  cartilage surgery     right  . Lumbar spine surgery     "put box in my lower back"  . Elbow bursa surgery     left  . Cataract extraction w/ intraocular lens  implant, bilateral   . Eye surgery 1940    fixed my crossed eyes"  . Kidney stone surgery     "right side; cut it out"  . Carpal tunnel release     right    No family history on file.  History  Substance Use Topics  . Smoking status: Never Smoker   . Smokeless tobacco: Never Used  . Alcohol Use: No    OB History    Grav Para Term Preterm Abortions TAB SAB Ect Mult Living                  Review of Systems  Constitutional: Negative for fever and chills.  HENT: Negative.   Eyes: Negative.   Respiratory: Negative.   Cardiovascular: Negative.   Gastrointestinal: Negative.   Genitourinary: Negative.   Musculoskeletal: Negative.   Neurological:       Reportedly had some confusion and was talking funny.  Psychiatric/Behavioral: Positive for confusion and altered mental status.    Allergies  Iohexol  Home Medications  Current Outpatient Rx  Name  Route  Sig  Dispense  Refill  . ACETAMINOPHEN 500 MG PO TABS   Oral   Take 1,000 mg by mouth every 6 (six) hours as needed. For pain         . ALPRAZOLAM 0.5 MG PO TABS   Oral   Take 0.5 mg by mouth every 8 (eight) hours as needed. For anxiety         . AMLODIPINE BESYLATE 10 MG PO TABS   Oral   Take 10 mg by mouth daily.         Marland Kitchen CITALOPRAM HYDROBROMIDE 40 MG PO TABS   Oral   Take 40 mg by mouth daily.         Marland Kitchen CLOPIDOGREL BISULFATE 75 MG PO TABS   Oral   Take 75 mg by mouth daily.         Marland Kitchen MEMANTINE HCL 10 MG PO TABS   Oral   Take 10 mg by mouth 2 (two) times daily.         Marland Kitchen NITROGLYCERIN 0.4 MG SL SUBL   Sublingual   Place 0.4 mg under the tongue every 5 (five) minutes as needed. For chest pain         . OLMESARTAN MEDOXOMIL-HCTZ 20-12.5 MG PO TABS   Oral   Take 1 tablet by mouth daily.         Marland Kitchen PANTOPRAZOLE SODIUM 40  MG PO TBEC   Oral   Take 1 tablet (40 mg total) by mouth daily.   30 tablet   11   . ROPINIROLE HCL 0.5 MG PO TABS   Oral   Take 0.5 mg by mouth at bedtime.         Marland Kitchen ROSUVASTATIN CALCIUM 10 MG PO TABS   Oral   Take 10 mg by mouth daily.         . TOLTERODINE TARTRATE ER 4 MG PO CP24   Oral   Take 4 mg by mouth daily as needed. For kidneys           There were no vitals taken for this visit.  Physical Exam  Nursing note and vitals reviewed. Constitutional: She is oriented to person, place, and time. She appears well-developed and well-nourished. No distress.       Elderly woman, awake and alert on EMS stretcher.  HENT:  Head: Normocephalic and atraumatic.  Right Ear: External ear normal.  Left Ear: External ear normal.  Mouth/Throat: Oropharynx is clear and moist.       No facial asymmetry.  Eyes: Conjunctivae normal and EOM are normal. Pupils are equal, round, and reactive to light.  Neck: Normal range of motion. Neck supple.  Cardiovascular: Normal rate, regular rhythm and normal heart sounds.   Pulmonary/Chest: Effort normal and breath sounds normal.  Abdominal: Soft. Bowel sounds are normal.  Musculoskeletal: Normal range of motion. She exhibits no edema and no tenderness.  Neurological: She is alert and oriented to person, place, and time.       No dysphasia.  No apparent sensory or motor deficit.  Skin: Skin is warm and dry.  Psychiatric: She has a normal mood and affect. Her behavior is normal.    ED Course  Procedures (including critical care time)  12:18 PM Pt was seen at the Pod [facility] main desk on the EMS stretcher at 12:05   CT of the head ordered.  Will recheck when returns from CT.   12:33  PM Pt back from CT.  Lab workup ordered.  12:44 PM CT showed old strokes, no new stroke.  Case discussed with Dr. Thad Ranger, neurologist, who will see pt.  3:55 PM Pt seen by neurology, who recommend TIA workup and admission by [facility]. I  discussed pt's condition and lab results with her.  4:45 PM Case discussed with Ramiro Harvest, M.D.  Admit to Team 4 to a telemetry bed, 4 [place].  1. Altered mental status   2. Unspecified transient cerebral ischemia          Carleene Cooper III, MD 02/23/12 [phone]

## 2012-02-23 NOTE — ED Notes (Signed)
Patient came in via EMS.   Per EMS, patients family advised that she had some confusion this morning around 1100 a.m., but has gotten better on the way here.   Per EMS, did not call a code stroke.  Patient was seen by Dr. Ignacia Palma on arrival  Before going to CT - and advised that code stroke would not be called at this time.

## 2012-02-23 NOTE — ED Notes (Signed)
Care transferred and report given to Shane, RN/Leslie, RN 

## 2012-02-23 NOTE — ED Notes (Signed)
Patient transported to MRI 

## 2012-02-23 NOTE — Consult Note (Signed)
Referring Physician: Ignacia Palma    Chief Complaint: Increased Confusion  HPI:                                                                                                                                         Amanda Hogan is an 78 y.o. 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  Patient lives with husband who is on hospice and has a home health nurse who takes care of them at her house.  Patient takes all he medications herself--per daughter "often does not take these medications if she feels she does not need them". One week ago patient was seen by dentist and had a tooth pulled. Subsequently placed on Amoxicillin which she has finished. Patient was last seen at her baseline at 4AM by her son.  Patient did not feel well around 9-9:30 and called her neighbor over for help.  At 9:30 the hospice nurse arrived at the house for her husband and noted patient was confused and not making sense with her conversation.  Daughter noted some slurred speech.  Patient was brought to the ED.  Since in the ED she has improved but daughter feels she still is showing some confusion.  On exam patient is hard of hearing but able to follow all commands.  She is in pain secondary to failed hardware in her back.    LSN: 4 AM tPA Given: No: out of window  Past Medical History  Diagnosis Date  . Hypertension   . Arthritis   . High cholesterol   . Acid reflux   . TIA (transient ischemic attack)   . Restless leg syndrome   . Complication of anesthesia     "w/my back; gave me too much; thought I'd had stroke but didn't"  . Heart murmur   . Stroke     "said it looked like bullet holes; had a bunch"  . Angina   . Exertional dyspnea   . Sinus malignant neoplasm   . Kidney stone   . Fibromyalgia   . Memory disorder, possibly not taking home meds at times 07/22/2011    Past Surgical History  Procedure Date  .  Cholecystectomy   . Vaginal hysterectomy   . Breast cyst excision     left  . Hernia repair     "stomach"  . Lung surgery     "made 3 holes and pulled gel out"  . Cardiac catheterization   . Knee cartilage surgery     right  . Lumbar spine surgery     "put box in my lower back"  . Elbow bursa surgery     left  . Cataract extraction w/ intraocular lens  implant, bilateral   . Eye surgery 1940    fixed my crossed eyes"  . Kidney stone surgery     "  right side; cut it out"  . Carpal tunnel release     right     family history Father--MI, CAD, HTN Mother--stroke, HTN Brother--Stroke Sister--stroke  Social History:   she has never smoked. She has never used smokeless tobacco. She reports that she does not drink alcohol or use illicit drugs.  Allergies:  Allergies  Allergen Reactions  . Iohexol      Desc: ITCHING/RESPIRATORY DISTRESS     Medications:                                                                                                                           No current facility-administered medications for this encounter.   Current Outpatient Prescriptions  Medication Sig Dispense Refill  . HYDROcodone-acetaminophen (NORCO/VICODIN) 5-325 MG per tablet Take 1 tablet by mouth every 6 (six) hours as needed. As needed for pain.      Marland Kitchen HYDROcodone-ibuprofen (VICOPROFEN) 7.5-200 MG per tablet Take 1 tablet by mouth every 8 (eight) hours as needed. As needed for pain.      . meloxicam (MOBIC) 7.5 MG tablet Take 7.5 mg by mouth 2 (two) times daily. Take one tablet twice a day with food for 2 weeks then as needed. Date filled was 02/22/12      . rOPINIRole (REQUIP) 0.5 MG tablet Take 0.5 mg by mouth at bedtime.      Marland Kitchen trimethoprim (TRIMPEX) 100 MG tablet Take 100 mg by mouth at bedtime.      Marland Kitchen acetaminophen (TYLENOL) 500 MG tablet Take 1,000 mg by mouth every 6 (six) hours as needed. For pain      . ALPRAZolam (XANAX) 0.5 MG tablet Take 0.5 mg by mouth every 8 (eight)  hours as needed. For anxiety      . amLODipine (NORVASC) 10 MG tablet Take 10 mg by mouth daily.      . citalopram (CELEXA) 40 MG tablet Take 40 mg by mouth daily.      . clopidogrel (PLAVIX) 75 MG tablet Take 75 mg by mouth daily.      . memantine (NAMENDA) 10 MG tablet Take 10 mg by mouth 2 (two) times daily.      . nitroGLYCERIN (NITROSTAT) 0.4 MG SL tablet Place 0.4 mg under the tongue every 5 (five) minutes as needed. For chest pain      . olmesartan-hydrochlorothiazide (BENICAR HCT) 20-12.5 MG per tablet Take 1 tablet by mouth daily.      . pantoprazole (PROTONIX) 40 MG tablet Take 1 tablet (40 mg total) by mouth daily.  30 tablet  11  . rosuvastatin (CRESTOR) 10 MG tablet Take 10 mg by mouth daily.      Marland Kitchen tolterodine (DETROL LA) 4 MG 24 hr capsule Take 4 mg by mouth daily as needed. For kidneys        ROS:  History obtained from the patient  General ROS: negative for - chills, fatigue, fever, night sweats, weight gain or weight loss Psychological ROS: positive for - memory difficulties,  Ophthalmic ROS: negative for - blurry vision, double vision, eye pain or loss of vision ENT ROS: negative for - epistaxis, nasal discharge, oral lesions, sore throat, tinnitus or vertigo Allergy and Immunology ROS: negative for - hives or itchy/watery eyes Hematological and Lymphatic ROS: negative for - bleeding problems, bruising or swollen lymph nodes Endocrine ROS: negative for - galactorrhea, hair pattern changes, polydipsia/polyuria or temperature intolerance Respiratory ROS: positive for - shortness of breath or wheezing Cardiovascular ROS: negative for - chest pain, dyspnea on exertion, edema or irregular heartbeat Gastrointestinal ROS: negative for - abdominal pain, diarrhea, hematemesis, nausea/vomiting or stool incontinence Genito-Urinary ROS: negative for -  dysuria, hematuria, incontinence or urinary frequency/urgency Musculoskeletal ROS: positive for - joint swelling and pain Neurological ROS: as noted in HPI Dermatological ROS: negative for rash and skin lesion changes  Neurologic Examination:                                                                                                      Blood pressure 176/69, pulse 70, temperature 98.3 F (36.8 C), temperature source Oral, resp. rate 20, height 5\' 4"  (1.626 m), weight 88.451 kg (195 lb), SpO2 94.00%.   Mental Status: Alert, oriented, thought content appropriate.  Speech fluent without evidence of aphasia.  Able to follow 3 step commands without difficulty. Cranial Nerves: II: Discs flat bilaterally; Visual fields grossly normal, pupils equal, round, reactive to light and accommodation III,IV, VI: ptosis not present, extra-ocular motions intact bilaterally V,VII: smile symmetric, facial light touch sensation normal bilaterally VIII: hearing decreasedl bilaterally IX,X: gag reflex present XI: bilateral shoulder shrug XII: midline tongue extension Motor: Right : Upper extremity   5/5    Left:     Upper extremity   5/5  Lower extremity   5/5     Lower extremity   5/5 (component of pain) Tone and bulk:normal tone throughout; no atrophy noted Sensory: Pinprick and light touch intact throughout, bilaterally Deep Tendon Reflexes: 2+ and symmetric throughout UE and KJ.  No AJ noted Plantars: Right: downgoing   Left: downgoing Cerebellar: normal finger-to-nose,  normal heel-to-shin test on the right--decreased on left secondary to pain.  CV: pulses palpable throughout     Results for orders placed during the hospital encounter of 02/23/12 (from the past 48 hour(s))  PROTIME-INR     Status: Normal   Collection Time   02/23/12 12:40 PM      Component Value Range Comment   Prothrombin Time 13.0  11.6 - 15.2 seconds    INR 0.99  0.00 - 1.49   APTT     Status: Normal   Collection Time    02/23/12 12:40 PM      Component Value Range Comment   aPTT 28  24 - 37 seconds   CBC     Status: Abnormal   Collection Time   02/23/12 12:40 PM      Component  Value Range Comment   WBC 7.7  4.0 - 10.5 K/uL    RBC 3.73 (*) 3.87 - 5.11 MIL/uL    Hemoglobin 11.4 (*) 12.0 - 15.0 g/dL    HCT 16.1 (*) 09.6 - 46.0 %    MCV 91.2  78.0 - 100.0 fL    MCH 30.6  26.0 - 34.0 pg    MCHC 33.5  30.0 - 36.0 g/dL    RDW 04.5  40.9 - 81.1 %    Platelets 287  150 - 400 K/uL   DIFFERENTIAL     Status: Normal   Collection Time   02/23/12 12:40 PM      Component Value Range Comment   Neutrophils Relative 69  43 - 77 %    Neutro Abs 5.4  1.7 - 7.7 K/uL    Lymphocytes Relative 20  12 - 46 %    Lymphs Abs 1.6  0.7 - 4.0 K/uL    Monocytes Relative 8  3 - 12 %    Monocytes Absolute 0.6  0.1 - 1.0 K/uL    Eosinophils Relative 2  0 - 5 %    Eosinophils Absolute 0.2  0.0 - 0.7 K/uL    Basophils Relative 0  0 - 1 %    Basophils Absolute 0.0  0.0 - 0.1 K/uL   POCT I-STAT, CHEM 8     Status: Abnormal   Collection Time   02/23/12  1:05 PM      Component Value Range Comment   Sodium 139  135 - 145 mEq/L    Potassium 3.8  3.5 - 5.1 mEq/L    Chloride 100  96 - 112 mEq/L    BUN 11  6 - 23 mg/dL    Creatinine, Ser 9.14  0.50 - 1.10 mg/dL    Glucose, Bld 96  70 - 99 mg/dL    Calcium, Ion 7.82 (*) 1.13 - 1.30 mmol/L    TCO2 29  0 - 100 mmol/L    Hemoglobin 11.6 (*) 12.0 - 15.0 g/dL    HCT 95.6 (*) 21.3 - 46.0 %    Ct Head Wo Contrast  02/23/2012  *RADIOLOGY REPORT*  Clinical Data: Confusion  CT HEAD WITHOUT CONTRAST  Technique:  Contiguous axial images were obtained from the base of the skull through the vertex without contrast. Study was obtained within 24 hours of patient arrival at the emergency department.  Comparison: January 09, 2010  Findings:  There is mild diffuse atrophy.  There is no mass, hemorrhage, extra-axial fluid collection, or midline shift.  There is evidence of a prior infarct in the mid to  superior right temporal lobe. There is evidence of a prior infarct in the posterior right frontal lobe, stable.  There is small vessel disease throughout the centra semiovale bilaterally.  There is small vessel disease in the left lentiform nucleus, stable.  There is no acute appearing infarct on this study.  The bony calvarium appears intact.  Mastoids are clear.  Impression:  Mild atrophy.  Prior infarcts, stable.  Supratentorial small vessel disease, stable.  No acute appearing infarct seen.  No hemorrhage or mass effect.   Original Report Authenticated By: Bretta Bang, M.D.      Felicie Morn PA-C [facility] [phone]  02/23/2012, 1:38 PM   Patient seen and examined.  Clinical course and management discussed.  Necessary edits performed.  I agree with the above.  Assessment and plan of care developed and discussed below.    Assessment:  78 y.o. female presenting with an episode of confusion.  Etiology unclear.  Stroke and TIA are on the differential.  Since patient has had a stroke in the past, seizure is on the differential as well.  Further work up recommended.    Stroke Risk Factors - hyperlipidemia, hypertension and TIA  Plan: 1. HgbA1c, fasting lipid panel 2. MRI, MRA  of the brain without contrast 3. PT consult, OT consult, Speech consult 4. Echocardiogram 5. Carotid dopplers 6. Prophylactic therapy-Would continue Plavix and start ASA 81mg  daily as well.   7. Risk factor modification 8. Telemetry monitoring 9. Frequent neuro checks  Thana Farr, MD [facility] [phone]  02/23/2012  3:58 PM

## 2012-02-23 NOTE — ED Notes (Signed)
Patient had gotten on bedpan a few minutes ago, and did urinate.  Unfortunately contents went all over bed.  Unable to get sample.

## 2012-02-23 NOTE — ED Notes (Signed)
MD at bedside. Dr. Thompson 

## 2012-02-23 NOTE — ED Notes (Signed)
Daughter at bedside with patient

## 2012-02-23 NOTE — H&P (Signed)
[facility] History and Physical  [name] WNU:[id number] DOB: 03/12/1929 DOA: 02/23/2012  Referring physician: Dr Ignacia Palma PCP: Reece Packer, [facility]  Specialists: Neurology: Dr Thad Ranger  Chief Complaint: Confusion  HPI: Amanda Hogan is a 78 y.o. female with past medical history of hypertension, hyperlipidemia, gastroesophageal reflux disease, prior history of TIA, history of ocular stroke, dementia, sinus lymphoma status post chemoradiation who lives with her husband who is on home hospice presented to the ED with confusion. History was obtained from patient's daughter and ED physician. Per patient's daughter patient was seen by PCP one to 2 weeks ago and placed on antibiotics for a cellulitis. Patient was last seen normal at baseline around 4 AM by her son. At around 9 9:30 AM hospice nurse saw the patient felt patient was confused and subsequently informed patient's daughter the patient was brought to the ED. Patient denies any slurred speech, no facial droop her family, no fever, no chills, no chest pain. Patient does endorse generalized pain especially in her back where she had hardware leg several years ago. Patient does endorse some shortness of breath, reflux, nausea, episode of diarrhea one week prior to admission. Patient denies any current emesis. Patient was seen in the ED has improved clinically and was not deemed a TPA candidate. Neurology was consulted upon and has seen the patient. Neurology is recommending a stroke workup. Head CT which was done was unremarkable.  We were called to admit patient for stroke workup.  Review of Systems: The patient denies anorexia, fever, weight loss,, vision loss, decreased hearing, hoarseness, chest pain, syncope, dyspnea on exertion, peripheral edema, balance deficits, hemoptysis, abdominal pain, melena, hematochezia, severe indigestion/heartburn, hematuria, incontinence, genital sores, muscle weakness, suspicious skin  lesions, transient blindness, difficulty walking, depression, unusual weight change, abnormal bleeding, enlarged lymph nodes, angioedema, and breast masses.   Past Medical History  Diagnosis Date  . Hypertension   . Arthritis   . High cholesterol   . Acid reflux   . TIA (transient ischemic attack)   . Restless leg syndrome   . Complication of anesthesia     "w/my back; gave me too much; thought I'd had stroke but didn't"  . Heart murmur   . Stroke     "said it looked like bullet holes; had a bunch"  . Angina   . Exertional dyspnea   . Sinus malignant neoplasm   . Kidney stone   . Fibromyalgia   . Memory disorder, possibly not taking home meds at times 07/22/2011  . Anxiety 02/23/2012  . Dementia 02/23/2012   Past Surgical History  Procedure Date  . Cholecystectomy   . Vaginal hysterectomy   . Breast cyst excision     left  . Hernia repair     "stomach"  . Lung surgery     "made 3 holes and pulled gel out"  . Cardiac catheterization   . Knee cartilage surgery     right  . Lumbar spine surgery     "put box in my lower back"  . Elbow bursa surgery     left  . Cataract extraction w/ intraocular lens  implant, bilateral   . Eye surgery 1940    fixed my crossed eyes"  . Kidney stone surgery     "right side; cut it out"  . Carpal tunnel release     right   Social History:  reports that she has never smoked. She has never used smokeless tobacco. She reports that she does  not drink alcohol or use illicit drugs.  Allergies  Allergen Reactions  . Iohexol      Desc: ITCHING/RESPIRATORY DISTRESS     No family history on file.   Prior to Admission medications   Medication Sig Start Date End Date Taking? Authorizing Provider  acetaminophen (TYLENOL) 500 MG tablet Take 1,000 mg by mouth every 6 (six) hours as needed. For pain   Yes Historical Provider, MD  ALPRAZolam (XANAX) 0.5 MG tablet Take 0.5 mg by mouth every 8 (eight) hours as needed. For anxiety   Yes Historical  Provider, MD  amLODipine (NORVASC) 10 MG tablet Take 10 mg by mouth daily.   Yes Historical Provider, MD  amoxicillin-clavulanate (AUGMENTIN) 875-125 MG per tablet Take 1 tablet by mouth 2 (two) times daily. Take two tablets immediately then take one tablet every twelve hours until finished. Started on 02/20/12. Quantity filled = 13 tablets.   Yes Historical Provider, MD  citalopram (CELEXA) 40 MG tablet Take 40 mg by mouth daily.   Yes Historical Provider, MD  clopidogrel (PLAVIX) 75 MG tablet Take 75 mg by mouth daily.   Yes Historical Provider, MD  HYDROcodone-acetaminophen (NORCO/VICODIN) 5-325 MG per tablet Take 1 tablet by mouth every 6 (six) hours as needed. As needed for pain.   Yes Historical Provider, MD  HYDROcodone-ibuprofen (VICOPROFEN) 7.5-200 MG per tablet Take 1 tablet by mouth every 8 (eight) hours as needed. As needed for pain.   Yes Historical Provider, MD  meloxicam (MOBIC) 7.5 MG tablet Take 7.5 mg by mouth 2 (two) times daily. Take one tablet twice a day with food for 2 weeks then as needed. Date filled was 02/22/12   Yes Historical Provider, MD  memantine (NAMENDA) 10 MG tablet Take 10 mg by mouth 2 (two) times daily.   Yes Historical Provider, MD  omeprazole (PRILOSEC) 20 MG capsule Take 20 mg by mouth every morning.   Yes Historical Provider, MD  rOPINIRole (REQUIP) 0.5 MG tablet Take 0.5 mg by mouth at bedtime.   Yes Historical Provider, MD  triamcinolone cream (KENALOG) 0.1 % Apply 1 application topically 2 (two) times daily.   Yes Historical Provider, MD  trimethoprim (TRIMPEX) 100 MG tablet Take 100 mg by mouth at bedtime.   Yes Historical Provider, MD  nitroGLYCERIN (NITROSTAT) 0.4 MG SL tablet Place 0.4 mg under the tongue every 5 (five) minutes as needed. For chest pain    Historical Provider, MD   Physical Exam: Filed Vitals:   02/23/12 1345 02/23/12 1417 02/23/12 1431 02/23/12 1515  BP:  190/114 197/75 166/59  Pulse:  71 80 70  Temp: 98.4 F (36.9 C)     TempSrc:       Resp:  20 14 18   Height:      Weight:      SpO2:  99% 97% 93%     General:  WDWN in NACPD  Eyes: PERRLA. EOMI.  ENT: Oropharynx clear, no lesions, no exudates.  Neck: Supple. No lymphadenopathy. No JVD.  Cardiovascular: Regular rate rhythm no murmurs rubs or gallops. No JVD. No lower extremity edema.  Respiratory: CTAB  Abdomen: Soft/NT/ND/+BS  Skin: No rashes no lesions  Musculoskeletal: 5/5 bilateral upper extremity. 5/ 5 bilateral lower extremity strength.  Psychiatric: Fair mood. Fair judgement. Fair insight.  Neurologic: Alert and oriented x 3. CN 2 -12 grossly intact. Visual fields are intact. 2+ bilateral brachial radialis reflexes. Unable to elicit patellar and Achilles reflexes symmetrically. Sensation is intact. Gait not tested secondary to safety.  Labs on Admission:  Basic Metabolic Panel:  Lab 02/23/12 1610 02/23/12 1240  NA 139 139  K 3.8 3.7  CL 100 98  CO2 -- 30  GLUCOSE 96 97  BUN 11 11  CREATININE 0.90 0.81  CALCIUM -- 10.6*  MG -- --  PHOS -- --   Liver Function Tests:  Lab 02/23/12 1240  AST 24  ALT 21  ALKPHOS 71  BILITOT 0.4  PROT 7.5  ALBUMIN 3.9   No results found for this basename: LIPASE:5,AMYLASE:5 in the last 168 hours No results found for this basename: AMMONIA:5 in the last 168 hours CBC:  Lab 02/23/12 1305 02/23/12 1240  WBC -- 7.7  NEUTROABS -- 5.4  HGB 11.6* 11.4*  HCT 34.0* 34.0*  MCV -- 91.2  PLT -- 287   Cardiac Enzymes:  Lab 02/23/12 1248  CKTOTAL --  CKMB --  CKMBINDEX --  TROPONINI <0.30    BNP (last 3 results) No results found for this basename: PROBNP:3 in the last 8760 hours CBG:  Lab 02/23/12 1317  GLUCAP 94    Radiological Exams on Admission: Ct Head Wo Contrast  02/23/2012  *RADIOLOGY REPORT*  Clinical Data: Confusion  CT HEAD WITHOUT CONTRAST  Technique:  Contiguous axial images were obtained from the base of the skull through the vertex without contrast. Study was obtained within  24 hours of patient arrival at the emergency department.  Comparison: January 09, 2010  Findings:  There is mild diffuse atrophy.  There is no mass, hemorrhage, extra-axial fluid collection, or midline shift.  There is evidence of a prior infarct in the mid to superior right temporal lobe. There is evidence of a prior infarct in the posterior right frontal lobe, stable.  There is small vessel disease throughout the centra semiovale bilaterally.  There is small vessel disease in the left lentiform nucleus, stable.  There is no acute appearing infarct on this study.  The bony calvarium appears intact.  Mastoids are clear.  Impression:  Mild atrophy.  Prior infarcts, stable.  Supratentorial small vessel disease, stable.  No acute appearing infarct seen.  No hemorrhage or mass effect.   Original Report Authenticated By: Bretta Bang, M.D.     EKG: Independently reviewed. Normal sinus rhythm  Assessment/Plan Principal Problem:  *Encephalopathy acute Active Problems:  Hypertension, at times poorly controlled  Hyperlipidemia  Gastroesophageal reflux disease  History of TIA (transient ischemic attack)  Memory disorder, possibly not taking home meds at times  Unspecified transient cerebral ischemia  Anemia  Chronic pain  Anxiety  Dementia   #1 acute encephalopathy Questionable etiology. Differential includes TIA versus CVA versus seizures versus infectious etiology which is unlikely as patient is afebrile with a normal white count versus worsening dementia. Patient is improved clinically is alert and oriented x3 and coherent. CT of the head is negative. Will admit the patient to telemetry. MRI MRA of the head has been ordered. Will check carotid Dopplers. Check a 2-D echo. Check a fasting lipid panel. Check a UA with cultures and sensitivities. Check a chest x-ray. Continue aspirin and Plavix for prophylactic therapy. Neurology is following and appreciate input and recommendations.  #2  hypertension Patient's systolic blood pressure has been in the 200s in the ED. Patient also however complaining of pain diffuse 3. Will treat patient's pain a monitor her blood pressure. Will resume her home regimen of Norvasc. Hydralazine as needed.  #3 gastroesophageal reflux disease PPI.  #4 hyperlipidemia Will check a fasting lipid panel.   #  5 dementia/memory disorder Stable. Continue Namenda, Celexa.  #6 anxiety Xanax as needed.  #7 chronic back pain Patient stated that she had x-rays done that showed loosening of hardware in her back. Patient states he is supposed to make an appointment to be seen by neurosurgery as outpatient. We'll continue home regimen of pain management. Patient is to followup with neurosurgery as outpatient.  #8 anemia Patient with no overt GI bleed. Check an anemia panel. Follow H&H.  #9 prophylaxis PPI for GI prophylaxis. Lovenox for DVT prophylaxis.   Code Status: Full Family Communication: Updated patient and daughter at bedside Disposition Plan: Admit to telemetry  Time spent: 65 mins  [facility] Pager [phone]  If 7PM-7AM, please contact night-coverage [email or web address] Password [email or web address] 02/23/2012, 6:05 PM

## 2012-02-23 NOTE — ED Notes (Signed)
Neurology at bedside for evaluation.

## 2012-02-23 NOTE — ED Notes (Signed)
Report given and care transferred to Nikki, RN 

## 2012-02-24 ENCOUNTER — Inpatient Hospital Stay (HOSPITAL_COMMUNITY): Payer: Medicare Other

## 2012-02-24 DIAGNOSIS — G459 Transient cerebral ischemic attack, unspecified: Secondary | ICD-10-CM

## 2012-02-24 LAB — CBC
Hemoglobin: 10.9 g/dL — ABNORMAL LOW (ref 12.0–15.0)
MCH: 30 pg (ref 26.0–34.0)
Platelets: 239 10*3/uL (ref 150–400)
RBC: 3.63 MIL/uL — ABNORMAL LOW (ref 3.87–5.11)
WBC: 5.4 10*3/uL (ref 4.0–10.5)

## 2012-02-24 LAB — HEMOGLOBIN A1C: Hgb A1c MFr Bld: 6.3 % — ABNORMAL HIGH (ref <5.7)

## 2012-02-24 LAB — LIPID PANEL
Cholesterol: 232 mg/dL — ABNORMAL HIGH (ref 0–200)
LDL Cholesterol: 124 mg/dL — ABNORMAL HIGH (ref 0–99)
Total CHOL/HDL Ratio: 5.8 RATIO
VLDL: 68 mg/dL — ABNORMAL HIGH (ref 0–40)

## 2012-02-24 LAB — BASIC METABOLIC PANEL
CO2: 30 mEq/L (ref 19–32)
Calcium: 9.3 mg/dL (ref 8.4–10.5)
GFR calc non Af Amer: 66 mL/min — ABNORMAL LOW (ref >90)
Glucose, Bld: 97 mg/dL (ref 70–99)
Potassium: 3.4 mEq/L — ABNORMAL LOW (ref 3.5–5.1)
Sodium: 139 mEq/L (ref 135–145)

## 2012-02-24 MED ORDER — SIMVASTATIN 20 MG PO TABS
20.0000 mg | ORAL_TABLET | Freq: Every day | ORAL | Status: DC
  Administered 2012-02-24: 20 mg via ORAL
  Filled 2012-02-24 (×2): qty 1

## 2012-02-24 MED ORDER — GI COCKTAIL ~~LOC~~
30.0000 mL | Freq: Three times a day (TID) | ORAL | Status: DC | PRN
  Administered 2012-02-24 – 2012-02-25 (×2): 30 mL via ORAL
  Filled 2012-02-24 (×2): qty 30

## 2012-02-24 NOTE — Progress Notes (Signed)
EEG COMPLETED

## 2012-02-24 NOTE — Progress Notes (Signed)
VASCULAR LAB PRELIMINARY  PRELIMINARY  PRELIMINARY  PRELIMINARY  Carotid duplex  completed.    Preliminary report:  Bilateral:  No evidence of hemodynamically significant internal carotid artery stenosis.   Vertebral artery flow is antegrade.      Bren Borys, RVT 02/24/2012, 9:16 AM

## 2012-02-24 NOTE — Progress Notes (Signed)
Patient evaluated for long-term disease management services with [facility] as a benefit of her [employer]. Met with Amanda Hogan who explains she lives at home with husband who is on Hospice. She has multiple children who assist with meals and so forth. Explained [facility] Care Management services and explained to her that she will receive a post discharge transition of care call and be evaluated for monthly home visits for assessments and for education. Explained to her that [facility] Care Management services do not interfere or replace any services arranged by inpatient case management or social work. Left contact information and [facility] literature at bedside with patient.    Raiford Noble, [facility] Liaison [phone]

## 2012-02-24 NOTE — Progress Notes (Signed)
[facility] PROGRESS NOTE  Amanda Hogan ION:[id number] DOB: 11-06-1928 DOA: 02/23/2012 PCP: Reece Packer, PHARMD  Assessment/Plan:  #1 acute encephalopathy Unknown etiology. MRI of the head MRA is negative for CVA. Patient is currently at baseline. EEG is pending to rule out seizures. Carotid Dopplers with no significant ICA stenosis. 2-D echo negative. Urinalysis is negative for UTI. Chest x-ray is negative for any acute infiltrate. Continue aspirin and Plavix. Follow. Neurology is following and appreciate input and recommendations.   #2 hypertension Stable. Continue current regimen of Norvasc.  #3 gastroesophageal reflux disease PPI. GI cocktail when necessary.  #4 hyperlipidemia LDL is 124. Continue statin.  #5 dementia/memory disorder Stable. Continue Namenda and Celexa.  #6 anxiety  Xanax as needed  #7 chronic back pain Patient will need to followup with neurosurgery as outpatient as per patient and family as some loosening of the hardware in her back.  #8 anemia By drop in hemoglobin likely dilutional in nature. No overt GI bleed. Anemia panel pending. Follow H/H. NSL IVF.  #9 history of recent cellulitis Improved. Continue home regimen of Augmentin.  #10 prophylaxis PPI for GI prophylaxis. Lovenox for DVT prophylaxis.  Code Status: Full Family Communication: Updated patient and daughter at bedside Disposition Plan: Home when medically stable   Consultants:  Neurology: Dr. Thad Ranger 02/23/2012  Procedures:  CT head 02/23/2012  Chest x-ray 02/23/2012  MRI/MRA Brain 02/23/12  Carotid dopplers 02/24/12  2-D echo 02/23/2029  EEG 02/24/2012  Antibiotics:  None  HPI/Subjective: Patient with no complaints. Patient denies any confusion. Per family patient closed her baseline. Patient is also complaining of heartburn.  Objective: Filed Vitals:   02/24/12 0130 02/24/12 0600 02/24/12 1058 02/24/12 1433  BP: 147/57 140/61 171/68 112/44   Pulse: 67 71 73 81  Temp: 97.8 F (36.6 C) 97.8 F (36.6 C) 97.8 F (36.6 C) 98.7 F (37.1 C)  TempSrc: Axillary Oral Oral Oral  Resp: 16 16 18 18   Height:      Weight:      SpO2: 97% 98% 95% 94%    Intake/Output Summary (Last 24 hours) at 02/24/12 1810 Last data filed at 02/24/12 1742  Gross per 24 hour  Intake    480 ml  Output      0 ml  Net    480 ml   Filed Weights   02/23/12 1215 02/23/12 1952  Weight: 88.451 kg (195 lb) 86.5 kg (190 lb 11.2 oz)    Exam:   General:  NAD  Cardiovascular: RRR  Respiratory: CTAB  Abdomen: Soft/NT/ND/+BS  Data Reviewed: Basic Metabolic Panel:  Lab 02/24/12 2440 02/23/12 2034 02/23/12 1305 02/23/12 1240  NA 139 -- 139 139  K 3.4* -- 3.8 3.7  CL 100 -- 100 98  CO2 30 -- -- 30  GLUCOSE 97 -- 96 97  BUN 11 -- 11 11  CREATININE 0.81 0.76 0.90 0.81  CALCIUM 9.3 -- -- 10.6*  MG -- -- -- --  PHOS -- -- -- --   Liver Function Tests:  Lab 02/23/12 1240  AST 24  ALT 21  ALKPHOS 71  BILITOT 0.4  PROT 7.5  ALBUMIN 3.9   No results found for this basename: LIPASE:5,AMYLASE:5 in the last 168 hours No results found for this basename: AMMONIA:5 in the last 168 hours CBC:  Lab 02/24/12 0610 02/23/12 2034 02/23/12 1305 02/23/12 1240  WBC 5.4 8.7 -- 7.7  NEUTROABS -- -- -- 5.4  HGB 10.9* 12.1 11.6* 11.4*  HCT 33.0* 35.9* 34.0*  34.0*  MCV 90.9 89.8 -- 91.2  PLT 239 264 -- 287   Cardiac Enzymes:  Lab 02/23/12 1248  CKTOTAL --  CKMB --  CKMBINDEX --  TROPONINI <0.30   BNP (last 3 results) No results found for this basename: PROBNP:3 in the last 8760 hours CBG:  Lab 02/23/12 1317  GLUCAP 94    No results found for this or any previous visit (from the past 240 hour(s)).   Studies: Dg Chest 2 View  02/24/2012  *RADIOLOGY REPORT*  Clinical Data: Stroke, weakness, high blood pressure  CHEST - 2 VIEW  Comparison: Portable chest x-ray of 07/21/2011  Findings: There is little change in aeration.  No infiltrate or  effusion is seen.  The heart is mildly enlarged.  No acute bony abnormality is seen.  IMPRESSION: Stable chest x-ray.  Mild cardiomegaly.   Original Report Authenticated By: Dwyane Dee, M.D.    Ct Head Wo Contrast  02/23/2012  *RADIOLOGY REPORT*  Clinical Data: Confusion  CT HEAD WITHOUT CONTRAST  Technique:  Contiguous axial images were obtained from the base of the skull through the vertex without contrast. Study was obtained within 24 hours of patient arrival at the emergency department.  Comparison: January 09, 2010  Findings:  There is mild diffuse atrophy.  There is no mass, hemorrhage, extra-axial fluid collection, or midline shift.  There is evidence of a prior infarct in the mid to superior right temporal lobe. There is evidence of a prior infarct in the posterior right frontal lobe, stable.  There is small vessel disease throughout the centra semiovale bilaterally.  There is small vessel disease in the left lentiform nucleus, stable.  There is no acute appearing infarct on this study.  The bony calvarium appears intact.  Mastoids are clear.  Impression:  Mild atrophy.  Prior infarcts, stable.  Supratentorial small vessel disease, stable.  No acute appearing infarct seen.  No hemorrhage or mass effect.   Original Report Authenticated By: Bretta Bang, M.D.    Mr Angiogram Head Wo Contrast  02/23/2012  *RADIOLOGY REPORT*  Clinical Data:  Hypertension. Dementia.  Prior strokes.  Altered mental status.  MRI BRAIN WITHOUT CONTRAST MRA HEAD WITHOUT CONTRAST  Technique: Multiplanar, multiecho pulse sequences of the brain and surrounding structures were obtained according to standard protocol without intravenous contrast.  Angiographic images of the head were obtained using MRA technique without contrast.  Comparison: 02/23/2012 CT.  06/01/2009 MR.  02/22/2006 MR and MR angiogram.  MRI HEAD  Findings:  No acute infarct.  Remote right temporal - frontal lobe moderate size infarct with encephalomalacia.   Remote small basal ganglia/corona radiata infarcts. Remote small right cerebellar infarct.  Prominent small vessel disease type changes.  No intracranial mass lesion detected on this unenhanced exam. Mild global atrophy without hydrocephalus.  Cervical spondylotic changes.  IMPRESSION: No acute infarct.  Please see above.  MRA HEAD  Findings: Motion degraded exam.  Suggestion of significant stenosis involving middle cerebral artery and A2 segment anterior cerebral artery branches bilaterally.  Stenosis proximal A1 segment left anterior cerebral artery. Stenosis mid to distal A1 segment left anterior cerebral artery. Stenosis distal M1 segment/right middle cerebral artery bifurcation.  Right vertebral artery ends in a PICA distribution.  Narrowing of the right vertebral artery proximal to the right PICA origin.  Ectatic left vertebral artery.  Nonvisualization left PICA.  Mild to moderate focal stenosis proximal basilar artery.  Nonvisualization AICAs.  Poor delineation superior cerebellar arteries.  Prominent stenosis posterior cerebral artery bilaterally.  Cannot exclude tiny medially directed left periophthalmic aneurysm versus tortuous vessel.  Motion degradation limits evaluation.  IMPRESSION: Motion degraded exam with significant intracranial atherosclerotic type changes as noted above.  Cannot exclude tiny medially directed left periophthalmic aneurysm versus tortuous vessel.  Motion degradation limits evaluation.   Original Report Authenticated By: Lacy Duverney, M.D.    Mr Brain Wo Contrast  02/23/2012  *RADIOLOGY REPORT*  Clinical Data:  Hypertension. Dementia.  Prior strokes.  Altered mental status.  MRI BRAIN WITHOUT CONTRAST MRA HEAD WITHOUT CONTRAST  Technique: Multiplanar, multiecho pulse sequences of the brain and surrounding structures were obtained according to standard protocol without intravenous contrast.  Angiographic images of the head were obtained using MRA technique without contrast.   Comparison: 02/23/2012 CT.  06/01/2009 MR.  02/22/2006 MR and MR angiogram.  MRI HEAD  Findings:  No acute infarct.  Remote right temporal - frontal lobe moderate size infarct with encephalomalacia.  Remote small basal ganglia/corona radiata infarcts. Remote small right cerebellar infarct.  Prominent small vessel disease type changes.  No intracranial mass lesion detected on this unenhanced exam. Mild global atrophy without hydrocephalus.  Cervical spondylotic changes.  IMPRESSION: No acute infarct.  Please see above.  MRA HEAD  Findings: Motion degraded exam.  Suggestion of significant stenosis involving middle cerebral artery and A2 segment anterior cerebral artery branches bilaterally.  Stenosis proximal A1 segment left anterior cerebral artery. Stenosis mid to distal A1 segment left anterior cerebral artery. Stenosis distal M1 segment/right middle cerebral artery bifurcation.  Right vertebral artery ends in a PICA distribution.  Narrowing of the right vertebral artery proximal to the right PICA origin.  Ectatic left vertebral artery.  Nonvisualization left PICA.  Mild to moderate focal stenosis proximal basilar artery.  Nonvisualization AICAs.  Poor delineation superior cerebellar arteries.  Prominent stenosis posterior cerebral artery bilaterally.  Cannot exclude tiny medially directed left periophthalmic aneurysm versus tortuous vessel.  Motion degradation limits evaluation.  IMPRESSION: Motion degraded exam with significant intracranial atherosclerotic type changes as noted above.  Cannot exclude tiny medially directed left periophthalmic aneurysm versus tortuous vessel.  Motion degradation limits evaluation.   Original Report Authenticated By: Lacy Duverney, M.D.     Scheduled Meds:   . [COMPLETED] sodium chloride   Intravenous STAT  . amLODipine  10 mg Oral Daily  . amoxicillin-clavulanate  1 tablet Oral BID  . citalopram  40 mg Oral Daily  . clopidogrel  75 mg Oral Daily  . enoxaparin  40 mg  Subcutaneous Q24H  . meloxicam  7.5 mg Oral BID  . memantine  10 mg Oral BID  . pantoprazole  40 mg Oral Q0600  . rOPINIRole  0.5 mg Oral QHS  . simvastatin  20 mg Oral q1800  . trimethoprim  100 mg Oral QHS   Continuous Infusions:   . sodium chloride 1,000 mL (02/23/12 1930)    Principal Problem:  *Encephalopathy acute Active Problems:  Hypertension, at times poorly controlled  Hyperlipidemia  Gastroesophageal reflux disease  History of TIA (transient ischemic attack)  Memory disorder, possibly not taking home meds at times  Unspecified transient cerebral ischemia  Anemia  Chronic pain  Anxiety  Dementia    Time spent: > 35 mins    [facility]  [facility] Pager [phone]. If 8PM-8AM, please contact night-coverage at [email or web address], password [email or web address] 02/24/2012, 6:10 PM  LOS: 1 day

## 2012-02-24 NOTE — Evaluation (Signed)
Speech Language Pathology Evaluation Patient Details Name: Amanda Hogan MRN: [id number] DOB: 1928-04-19 Today's Date: 02/24/2012 Time: 9562-1308 SLP Time Calculation (min): 12 min  Problem List:  Patient Active Problem List  Diagnosis  . Hypertension, at times poorly controlled  . Chest pain, negative MI,  Negative nuculear stress test, may be related to HTN  . Hyperlipidemia  . Gastroesophageal reflux disease  . History of TIA (transient ischemic attack)  . Obesity  . Memory disorder, possibly not taking home meds at times  . Unspecified transient cerebral ischemia  . Encephalopathy acute  . Anemia  . Chronic pain  . Anxiety  . Dementia   Past Medical History:  Past Medical History  Diagnosis Date  . Hypertension   . Arthritis   . High cholesterol   . Acid reflux   . TIA (transient ischemic attack)   . Restless leg syndrome   . Complication of anesthesia     "w/my back; gave me too much; thought I'd had stroke but didn't"  . Heart murmur   . Stroke     "said it looked like bullet holes; had a bunch"  . Angina   . Exertional dyspnea   . Sinus malignant neoplasm   . Fibromyalgia   . Memory disorder, possibly not taking home meds at times 07/22/2011  . Anxiety 02/23/2012  . Dementia 02/23/2012  . Kidney stone    Past Surgical History:  Past Surgical History  Procedure Date  . Cholecystectomy   . Vaginal hysterectomy   . Breast cyst excision     left  . Hernia repair     "stomach"  . Lung surgery     "made 3 holes and pulled gel out"  . Cardiac catheterization   . Knee cartilage surgery     right  . Lumbar spine surgery     "put box in my lower back"  . Elbow bursa surgery     left  . Cataract extraction w/ intraocular lens  implant, bilateral   . Eye surgery 1940    fixed my crossed eyes"  . Kidney stone surgery     "right side; cut it out"  . Carpal tunnel release     right   HPI:  78 year old female admitted with confusion which improved in ED.  Head CT negative. Dx include TIA vs CVA vs seizure vs infectious etiology. Per MD, patient clinically improved.    Assessment / Plan / Recommendation Clinical Impression  Evaluation complete. Discussed with daughter via phone and patient's function has now returned to baseline with dementia contributing to decreased safety at home. Daugther reported that she does not feel comfortable with patient being home alone at this time.  No further acute SLP needs indicated. Patient will likely need assist from case manager/SW after evaluated by PT/OT to determine most appropriate d/c destination.      SLP Assessment  Patient does not need any further Speech Lanaguage Pathology Services    Follow Up Recommendations  None       Pertinent Vitals/Pain n/a       SLP Evaluation Prior Functioning  Cognitive/Linguistic Baseline: Baseline deficits (baseline dementia, returned to baseline per daughter) Type of Home: House Lives With: Spouse (spouse under hospice care)   Cognition  Overall Cognitive Status: Impaired Arousal/Alertness: Awake/alert Orientation Level: Oriented X4 Attention: Focused;Sustained Focused Attention: Appears intact Sustained Attention: Appears intact Memory: Impaired Memory Impairment: Decreased recall of new information Awareness: Impaired Awareness Impairment: Intellectual impairment (for specifics  regarding current admission) Problem Solving: Appears intact (for basic ADLs) Safety/Judgment: Appears intact (for basic ADLs)    Comprehension  Auditory Comprehension Overall Auditory Comprehension: Appears within functional limits for tasks assessed Reading Comprehension Reading Status: Not tested    Expression Expression Primary Mode of Expression: Verbal Verbal Expression Overall Verbal Expression: Appears within functional limits for tasks assessed   Oral / Motor Oral Motor/Sensory Function Overall Oral Motor/Sensory Function: Appears within functional limits for  tasks assessed Motor Speech Overall Motor Speech: Appears within functional limits for tasks assessed   GO   Ferdinand Lango MA, CCC-SLP [phone]   Amanda Hogan 02/24/2012, 3:45 PM

## 2012-02-24 NOTE — Progress Notes (Signed)
  Echocardiogram 2D Echocardiogram has been performed.  Amanda Hogan 02/24/2012, 9:35 AM

## 2012-02-24 NOTE — Procedures (Signed)
ELECTROENCEPHALOGRAM REPORT   Patient: Amanda Hogan       Room #: 1O10 EEG No. ID: [id number] Age: 78 y.o.        Sex: female Referring Physician: Janee Morn Report Date:  02/24/2012        Interpreting Physician: Thana Farr D  History: Amanda Hogan is an 78 y.o. female presenting with an episode of confusion  Medications:  Scheduled:   . amLODipine  10 mg Oral Daily  . amoxicillin-clavulanate  1 tablet Oral BID  . citalopram  40 mg Oral Daily  . clopidogrel  75 mg Oral Daily  . enoxaparin  40 mg Subcutaneous Q24H  . meloxicam  7.5 mg Oral BID  . memantine  10 mg Oral BID  . pantoprazole  40 mg Oral Q0600  . rOPINIRole  0.5 mg Oral QHS  . simvastatin  20 mg Oral q1800  . trimethoprim  100 mg Oral QHS    Conditions of Recording:  This is a 16 channel EEG carried out with the patient in the drowsy and asleep states.  Description:  The patient is not fully awake during the recording and therefore wakefulness can not be evaluated.   The patient is asleep throughout the majority of the tracing. During light sleep there is noted symmetrical sleep spindles and vertex central sharp transients superimposed on an irregular slow background activity.  The patient is alerted to drowse when the background activity improves in frequency to mostly theta rhythms that are poorly organized.  No sustained background rhythm is able to be discerned.   Hyperventilation was not performed.  Intermittent photic stimulation was performed but failed to illicit any change in the tracing.   IMPRESSION: This is a normal drowsy and asleep EEG   Thana Farr, MD [facility] [phone] 02/24/2012, 11:12 PM

## 2012-02-24 NOTE — Progress Notes (Signed)
Kindly informed that Mrs. Amanda Hogan, a patient of Dr. Alanda Amass, was admitted to the hospital for confusion. She has a history of HTN and has had difficulty with that here. I reviewed her echocardiogram today and there is mild aortic stenosis, moderate concentric LVH and hyperdynamic ventricular function with an EF ~70%.  It should be noted that she was on Benicar HCT 20/12.5 as an outpatient in addition to amlodipine and that may need to be added back to her regimen. I don't see any acute cardiac issues as a cause of her confusion - this could be medication related, however, she is known to have dementia as well.  Please call us back if needed. Her last office note from Dr. Alanda Amass is in the chart.  Chrystie Nose, MD, [facility] Attending Cardiologist [facility]

## 2012-02-24 NOTE — Evaluation (Signed)
Physical Therapy Evaluation Patient Details Name: Amanda Hogan MRN: [id number] DOB: Aug 18, 1928 Today's Date: 02/24/2012 Time: 1478-2956 PT Time Calculation (min): 37 min  PT Assessment / Plan / Recommendation Clinical Impression  78 yo adm with AMS with daughter reporting pt has returned to baseline. Pt answered all prior functional status and home envirnoment questions correctly per daughter and oriented x 4. Pt has recently been limited in her mobility due to LLE cellulitis infection with pain. Recommend continued PT to incr safety with mobility.    PT Assessment  Patient needs continued PT services    Follow Up Recommendations  Home health PT;Supervision - Intermittent    Does the patient have the potential to tolerate intense rehabilitation      Barriers to Discharge None      Equipment Recommendations  None recommended by PT    Recommendations for Other Services OT consult   Frequency Min 3X/week    Precautions / Restrictions Precautions Precautions: Fall   Pertinent Vitals/Pain Lt knee pain; unable to rate; + antalgic gait; instructed in use of RW to unweight leg; repositioned      Mobility  Bed Mobility Bed Mobility: Rolling Left;Left Sidelying to Sit;Sitting - Scoot to Edge of Bed Rolling Left: 5: Supervision Left Sidelying to Sit: 5: Supervision;HOB flat Sitting - Scoot to Edge of Bed: 7: Independent Details for Bed Mobility Assistance: incr effort and time for pt to come over and up; near need for assist Transfers Transfers: Sit to Stand;Stand to Sit Sit to Stand: 4: Min guard;With upper extremity assist;From bed Stand to Sit: 4: Min guard;With upper extremity assist;With armrests;To chair/3-in-1 Details for Transfer Assistance: x2; cues for safe use of RW/hand placement Ambulation/Gait Ambulation/Gait Assistance: 4: Min guard Ambulation Distance (Feet): 70 Feet Assistive device: Rolling walker Ambulation/Gait Assistance Details: slight antalgic gait,  no unsteadiness with RW; pt not able to try with lesser device due to incr reliance on UEs due to LLE pain Gait Pattern: Step-to pattern;Decreased stride length;Decreased weight shift to left;Decreased stance time - left;Shuffle    Shoulder Instructions     Exercises     PT Diagnosis: Difficulty walking;Acute pain  PT Problem List: Decreased activity tolerance;Decreased balance;Decreased knowledge of use of DME;Pain PT Treatment Interventions: DME instruction;Gait training;Stair training;Functional mobility training;Therapeutic activities;Patient/family education   PT Goals Acute Rehab PT Goals PT Goal Formulation: With patient/family Time For Goal Achievement: 02/28/12 Potential to Achieve Goals: Good Pt will Roll Supine to Left Side: with modified independence PT Goal: Rolling Supine to Left Side - Progress: Goal set today Pt will go Supine/Side to Sit: with modified independence;with HOB 0 degrees PT Goal: Supine/Side to Sit - Progress: Goal set today Pt will go Sit to Supine/Side: with modified independence;with HOB 0 degrees PT Goal: Sit to Supine/Side - Progress: Goal set today Pt will go Sit to Stand: with supervision;with upper extremity assist PT Goal: Sit to Stand - Progress: Goal set today Pt will go Stand to Sit: with supervision;with upper extremity assist PT Goal: Stand to Sit - Progress: Goal set today Pt will Ambulate: 51 - 150 feet;with supervision;with least restrictive assistive device PT Goal: Ambulate - Progress: Goal set today Pt will Go Up / Down Stairs: 3-5 stairs;with rail(s);with min assist PT Goal: Up/Down Stairs - Progress: Goal set today  Visit Information  Last PT Received On: 02/24/12 Assistance Needed: +1    Subjective Data  Subjective: Pt reports she's been having a harder time keeping up with her husband (dementia) since her cellulitis  infection. Patient Stated Goal: return home to help care for her husband   Prior Functioning  Home  Living Lives With: Spouse Available Help at Discharge: Personal care attendant (10 a-8 p 5 days/wk) Type of Home: House Home Access: Stairs to enter [employer] of Steps: 5 Entrance Stairs-Rails: Right Home Layout: One level Bathroom Shower/Tub: Forensic scientist: Standard Bathroom Accessibility: Yes How Accessible: Accessible via walker Home Adaptive Equipment: Walker - rolling;Bedside commode/3-in-1;Quad cane;Straight cane;Tub transfer bench Additional Comments: per daughter, pt has been rolling around on rolling chair (?4 wheeled walker the way she describes) for last couple of weeks due to LLE pain/cellulitis; uses tub seat Prior Function Level of Independence: Needs assistance Needs Assistance: Light Housekeeping Able to Take Stairs?: Yes Driving: Yes Vocation: Retired Comments: states her husband is physically capable, needs assist with medicines, supervision for safety Communication Communication: HOH    Cognition  Overall Cognitive Status: History of cognitive impairments - at baseline Arousal/Alertness: Lethargic Orientation Level: Oriented X4 / Intact Behavior During Session: Lethargic Cognition - Other Comments: reports she had pain medicine and is sleepy    Extremity/Trunk Assessment Right Lower Extremity Assessment RLE ROM/Strength/Tone: WFL for tasks assessed Left Lower Extremity Assessment LLE ROM/Strength/Tone: WFL for tasks assessed   Balance Balance Balance Assessed: Yes Static Standing Balance Static Standing - Balance Support: No upper extremity supported Static Standing - Level of Assistance: 5: Stand by assistance  End of Session PT - End of Session Equipment Utilized During Treatment: Gait belt Activity Tolerance: Patient tolerated treatment well Patient left: in chair;with call bell/phone within reach;with family/visitor present Nurse Communication: Mobility status  GP     Amanda Hogan 02/24/2012, 5:09 PM  Pager  [phone]

## 2012-02-25 DIAGNOSIS — G459 Transient cerebral ischemic attack, unspecified: Principal | ICD-10-CM

## 2012-02-25 DIAGNOSIS — K219 Gastro-esophageal reflux disease without esophagitis: Secondary | ICD-10-CM

## 2012-02-25 LAB — BASIC METABOLIC PANEL
CO2: 28 mEq/L (ref 19–32)
Chloride: 100 mEq/L (ref 96–112)
Creatinine, Ser: 0.91 mg/dL (ref 0.50–1.10)
Glucose, Bld: 98 mg/dL (ref 70–99)

## 2012-02-25 MED ORDER — PANTOPRAZOLE SODIUM 40 MG PO TBEC: 40.0000 mg | DELAYED_RELEASE_TABLET | Freq: Two times a day (BID) | ORAL | Status: DC

## 2012-02-25 MED ORDER — AMOXICILLIN-POT CLAVULANATE 875-125 MG PO TABS: 1.0000 | ORAL_TABLET | Freq: Two times a day (BID) | ORAL | Status: AC

## 2012-02-25 MED ORDER — ASPIRIN 81 MG PO TABS: 81.0000 mg | ORAL_TABLET | Freq: Every day | ORAL | Status: DC

## 2012-02-25 MED ORDER — CITALOPRAM HYDROBROMIDE 20 MG PO TABS: 20.0000 mg | ORAL_TABLET | Freq: Every day | ORAL | Status: DC

## 2012-02-25 MED ORDER — SIMVASTATIN 20 MG PO TABS: 20.0000 mg | ORAL_TABLET | Freq: Every day | ORAL | Status: DC

## 2012-02-25 MED ORDER — GI COCKTAIL ~~LOC~~: 30.0000 mL | Freq: Three times a day (TID) | ORAL | Status: DC | PRN

## 2012-02-25 NOTE — Progress Notes (Signed)
Physical Therapy Discharge Patient Details Name: Amanda Hogan MRN: [id number] DOB: [birth year] Today's Date: 02/25/2012 Time: 5284-1324 PT Time Calculation (min): 16 min  Patient discharged from PT services secondary to goals met and no further PT needs identified.  Please see latest therapy progress note for current level of functioning and progress toward goals.    Progress and discharge plan discussed with patient and/or caregiver: Patient/Caregiver agrees with plan  GP     [name] 02/25/2012, 10:36 AM

## 2012-02-25 NOTE — Discharge Summary (Signed)
Physician Discharge Summary  [name]:[id number] DOB: 05/10/1928 DOA: 02/23/2012  PCP: Leo Grosser, MD  Admit date: 02/23/2012 Discharge date: 02/25/2012  Time spent: 60 minutes  Recommendations for Outpatient Follow-up:  1. Patient is to followup with PCP one week post discharge. On followup patient has been started on a statin and had cholesterol need to be reassessed in about 4-6 weeks. Patient will also need a comprehensive metabolic profile done in about 4-6 weeks to followup on her liver enzymes. 2. Patient is to followup with Dr. Pearlean Brownie of [place] with neurology in 2 months. 3. Patient is to schedule outpatient followup with her neurosurgeon as previously scheduled.  Discharge Diagnoses:  Principal Problem:  *TIA (transient ischemic attack) Active Problems:  Hypertension, at times poorly controlled  Hyperlipidemia  Gastroesophageal reflux disease  History of TIA (transient ischemic attack)  Memory disorder, possibly not taking home meds at times  Unspecified transient cerebral ischemia  Encephalopathy acute  Anemia  Chronic pain  Anxiety  Dementia   Discharge Condition: Stable and improved  Diet recommendation: Heart healthy  Filed Weights   02/23/12 1215 02/23/12 1952  Weight: 88.451 kg (195 lb) 86.5 kg (190 lb 11.2 oz)    History of present illness:  Amanda Hogan is a 78 y.o. female with past medical history of hypertension, hyperlipidemia, gastroesophageal reflux disease, prior history of TIA, history of ocular stroke, dementia, sinus lymphoma status post chemoradiation who lives with her husband who is on home hospice presented to the ED with confusion. History was obtained from patient's daughter and ED physician. Per patient's daughter patient was seen by PCP one to 2 weeks ago and placed on antibiotics for a cellulitis. Patient was last seen normal at baseline around 4 AM by her son. At around 9 9:30 AM hospice nurse saw the patient felt patient  was confused and subsequently informed patient's daughter the patient was brought to the ED. Patient denies any slurred speech, no facial droop her family, no fever, no chills, no chest pain. Patient does endorse generalized pain especially in her back where she had hardware leg several years ago. Patient does endorse some shortness of breath, reflux, nausea, episode of diarrhea one week prior to admission. Patient denies any current emesis. Patient was seen in the ED has improved clinically and was not deemed a TPA candidate. Neurology was consulted upon and has seen the patient. Neurology is recommending a stroke workup. Head CT which was done was unremarkable. We were called to admit patient for stroke workup.      Hospital Course:  #1 probable TIA Patient was admitted to the hospital with an acute bout of confusion due to a history of hypertension hyperlipidemia prior history of TIA and dementia those a concern for possible stroke. Patient was admitted to the telemetry floor and underwent a stroke workup. MRI and MRA of the head which was done was negative for any acute infarction. Carotid Dopplers which were done showed no significant ICA stenosis. 2-D echo which was done showed no evidence of intracardiac masses or thrombi. EEG which was also done was negative. Patient was maintained on Plavix during the hospitalization she improved clinically and his symptoms had resolved. Patient's speech was back to baseline and she was ambulatory. Patient's family felt patient was close to baseline. Patient will be discharged home on baby dose aspirin of 81 mg daily as well as Plavix 75 mg daily. Patient was started on Zocor 20 mg daily as her fasting lipid panel had  an LDL of 124 with goal LDL needing to be less than 100. Patient be discharged home in stable and improved condition to followup with PCP one week post discharge and also to followup with Dr. Pearlean Brownie of neurology in 2 months the patient be discharged home  in stable and improved condition.  #2 hyperlipidemia During patient's stroke workup fasting lipid panel which was obtained had a total cholesterol of 232 with LDL of 124. Patient was started on Zocor during the hospitalization as her goal LDL needs to be less than 098. Patient will need to followup with PCP as outpatient.  #3 gastroesophageal reflux disease Patient did have complaints of heartburn throughout the hospitalization. Patient was placed on a proton pump inhibitor of Protonix 40 mg daily. Patient be discharged home on Protonix 40 mg twice daily in addition to a GI cocktail as needed. Patient will followup with PCP as outpatient.  #4 history of recent cellulitis Remained stable during the hospitalization. Patient's cellulitis had improved significantly. Patient was maintained on a home regimen of Augmentin and will need one more day of oral Augmentin on discharge. Patient will followup with PCP as outpatient.  #5 chronic back pain Patient does have a history of chronic back pain with some loosening of hardware in the back. Patient is to followup with neurosurgery as outpatient for further evaluation and management.  The rest of patient's chronic medical issues remained stable throughout the hospitalization and patient be discharged in stable and improved condition.  Procedures: CT head 02/23/2012  Chest x-ray 02/23/2012  MRI/MRA Brain 02/23/12  Carotid dopplers 02/24/12  2-D echo 02/23/2029  EEG 02/24/2012     Consultations:  Neurology: Dr. Thad Ranger 02/23/2012  Discharge Exam: Filed Vitals:   02/24/12 2121 02/25/12 0201 02/25/12 0612 02/25/12 1054  BP: 142/64 133/60 132/58 141/55  Pulse: 82 78 77 75  Temp: 98.1 F (36.7 C) 98 F (36.7 C) 98.5 F (36.9 C) 98.5 F (36.9 C)  TempSrc: Oral Oral Oral Oral  Resp: 20 20 20 20   Height:      Weight:      SpO2: 94% 94% 94% 94%    General: NAD Cardiovascular: RRR with 2-3/6 SEM Respiratory: CTAB  Discharge  Instructions      Discharge Orders    Future Orders Please Complete By Expires   Diet - low sodium heart healthy      Increase activity slowly      Discharge instructions      Comments:   Follow up with Reece Packer, PHARMD in 1 week. Follow up with Dr Pearlean Brownie, Neurology in 2 months       Medication List     As of 02/25/2012  1:53 PM    STOP taking these medications         omeprazole 20 MG capsule   Commonly known as: PRILOSEC      TAKE these medications         acetaminophen 500 MG tablet   Commonly known as: TYLENOL   Take 1,000 mg by mouth every 6 (six) hours as needed. For pain      ALPRAZolam 0.5 MG tablet   Commonly known as: XANAX   Take 0.5 mg by mouth every 8 (eight) hours as needed. For anxiety      amLODipine 10 MG tablet   Commonly known as: NORVASC   Take 10 mg by mouth daily.      amoxicillin-clavulanate 875-125 MG per tablet   Commonly known as: AUGMENTIN  Take 1 tablet by mouth 2 (two) times daily. Take two tablets immediately then take one tablet every twelve hours until finished. Started on 02/20/12. Quantity filled = 13 tablets.  Take for 1 more day with last dose tomorrow 02/26/12      aspirin 81 MG tablet   Take 1 tablet (81 mg total) by mouth daily.      citalopram 20 MG tablet   Commonly known as: CELEXA   Take 1 tablet (20 mg total) by mouth daily.      clopidogrel 75 MG tablet   Commonly known as: PLAVIX   Take 75 mg by mouth daily.      gi cocktail Susp suspension   Take 30 mLs by mouth 3 (three) times daily as needed for indigestion. Shake well.      HYDROcodone-acetaminophen 5-325 MG per tablet   Commonly known as: NORCO/VICODIN   Take 1 tablet by mouth every 6 (six) hours as needed. As needed for pain.      HYDROcodone-ibuprofen 7.5-200 MG per tablet   Commonly known as: VICOPROFEN   Take 1 tablet by mouth every 8 (eight) hours as needed. As needed for pain.      meloxicam 7.5 MG tablet   Commonly known as:  MOBIC   Take 7.5 mg by mouth 2 (two) times daily. Take one tablet twice a day with food for 2 weeks then as needed. Date filled was 02/22/12      memantine 10 MG tablet   Commonly known as: NAMENDA   Take 10 mg by mouth 2 (two) times daily.      nitroGLYCERIN 0.4 MG SL tablet   Commonly known as: NITROSTAT   Place 0.4 mg under the tongue every 5 (five) minutes as needed. For chest pain      pantoprazole 40 MG tablet   Commonly known as: PROTONIX   Take 1 tablet (40 mg total) by mouth 2 (two) times daily before a meal.      rOPINIRole 0.5 MG tablet   Commonly known as: REQUIP   Take 0.5 mg by mouth at bedtime.      simvastatin 20 MG tablet   Commonly known as: ZOCOR   Take 1 tablet (20 mg total) by mouth daily at 6 PM.      triamcinolone cream 0.1 %   Commonly known as: KENALOG   Apply 1 application topically 2 (two) times daily.      trimethoprim 100 MG tablet   Commonly known as: TRIMPEX   Take 100 mg by mouth at bedtime.         Follow-up Information    Follow up with Gates Rigg, MD. Schedule an appointment as soon as possible for a visit in 2 months.   Contact information:   [address] [facility] [place] [address] [phone]       Follow up with [facility] TOM, MD. Schedule an appointment as soon as possible for a visit in 1 week.   Contact information:   [address] [place] [address] [phone]       Follow up with Hewitt Shorts, MD. Schedule an appointment as soon as possible for a visit in 1 week.   Contact information:   [address] [place] [address] [phone]           The results of significant diagnostics from this hospitalization (including imaging, microbiology, ancillary  and laboratory) are listed below for reference.    Significant Diagnostic Studies: Dg Chest 2 View  02/24/2012  *RADIOLOGY REPORT*  Clinical Data:  Stroke, weakness, high blood pressure  CHEST - 2 VIEW  Comparison: Portable chest x-ray of 07/21/2011  Findings: There is little change in aeration.  No infiltrate or effusion is seen.  The heart is mildly enlarged.  No acute bony abnormality is seen.  IMPRESSION: Stable chest x-ray.  Mild cardiomegaly.   Original Report Authenticated By: Dwyane Dee, M.D.    Ct Head Wo Contrast  02/23/2012  *RADIOLOGY REPORT*  Clinical Data: Confusion  CT HEAD WITHOUT CONTRAST  Technique:  Contiguous axial images were obtained from the base of the skull through the vertex without contrast. Study was obtained within 24 hours of patient arrival at the emergency department.  Comparison: January 09, 2010  Findings:  There is mild diffuse atrophy.  There is no mass, hemorrhage, extra-axial fluid collection, or midline shift.  There is evidence of a prior infarct in the mid to superior right temporal lobe. There is evidence of a prior infarct in the posterior right frontal lobe, stable.  There is small vessel disease throughout the centra semiovale bilaterally.  There is small vessel disease in the left lentiform nucleus, stable.  There is no acute appearing infarct on this study.  The bony calvarium appears intact.  Mastoids are clear.  Impression:  Mild atrophy.  Prior infarcts, stable.  Supratentorial small vessel disease, stable.  No acute appearing infarct seen.  No hemorrhage or mass effect.   Original Report Authenticated By: Bretta Bang, M.D.    Mr Angiogram Head Wo Contrast  02/23/2012  *RADIOLOGY REPORT*  Clinical Data:  Hypertension. Dementia.  Prior strokes.  Altered mental status.  MRI BRAIN WITHOUT CONTRAST MRA HEAD WITHOUT CONTRAST  Technique: Multiplanar, multiecho pulse sequences of the brain and surrounding structures were obtained according to standard protocol without intravenous contrast.  Angiographic images of the head were obtained using MRA technique without contrast.  Comparison: 02/23/2012 CT.   06/01/2009 MR.  02/22/2006 MR and MR angiogram.  MRI HEAD  Findings:  No acute infarct.  Remote right temporal - frontal lobe moderate size infarct with encephalomalacia.  Remote small basal ganglia/corona radiata infarcts. Remote small right cerebellar infarct.  Prominent small vessel disease type changes.  No intracranial mass lesion detected on this unenhanced exam. Mild global atrophy without hydrocephalus.  Cervical spondylotic changes.  IMPRESSION: No acute infarct.  Please see above.  MRA HEAD  Findings: Motion degraded exam.  Suggestion of significant stenosis involving middle cerebral artery and A2 segment anterior cerebral artery branches bilaterally.  Stenosis proximal A1 segment left anterior cerebral artery. Stenosis mid to distal A1 segment left anterior cerebral artery. Stenosis distal M1 segment/right middle cerebral artery bifurcation.  Right vertebral artery ends in a PICA distribution.  Narrowing of the right vertebral artery proximal to the right PICA origin.  Ectatic left vertebral artery.  Nonvisualization left PICA.  Mild to moderate focal stenosis proximal basilar artery.  Nonvisualization AICAs.  Poor delineation superior cerebellar arteries.  Prominent stenosis posterior cerebral artery bilaterally.  Cannot exclude tiny medially directed left periophthalmic aneurysm versus tortuous vessel.  Motion degradation limits evaluation.  IMPRESSION: Motion degraded exam with significant intracranial atherosclerotic type changes as noted above.  Cannot exclude tiny medially directed left periophthalmic aneurysm versus tortuous vessel.  Motion degradation limits evaluation.   Original Report Authenticated By: Lacy Duverney, M.D.    Mr Brain Wo Contrast  02/23/2012  *  RADIOLOGY REPORT*  Clinical Data:  Hypertension. Dementia.  Prior strokes.  Altered mental status.  MRI BRAIN WITHOUT CONTRAST MRA HEAD WITHOUT CONTRAST  Technique: Multiplanar, multiecho pulse sequences of the brain and surrounding  structures were obtained according to standard protocol without intravenous contrast.  Angiographic images of the head were obtained using MRA technique without contrast.  Comparison: 02/23/2012 CT.  06/01/2009 MR.  02/22/2006 MR and MR angiogram.  MRI HEAD  Findings:  No acute infarct.  Remote right temporal - frontal lobe moderate size infarct with encephalomalacia.  Remote small basal ganglia/corona radiata infarcts. Remote small right cerebellar infarct.  Prominent small vessel disease type changes.  No intracranial mass lesion detected on this unenhanced exam. Mild global atrophy without hydrocephalus.  Cervical spondylotic changes.  IMPRESSION: No acute infarct.  Please see above.  MRA HEAD  Findings: Motion degraded exam.  Suggestion of significant stenosis involving middle cerebral artery and A2 segment anterior cerebral artery branches bilaterally.  Stenosis proximal A1 segment left anterior cerebral artery. Stenosis mid to distal A1 segment left anterior cerebral artery. Stenosis distal M1 segment/right middle cerebral artery bifurcation.  Right vertebral artery ends in a PICA distribution.  Narrowing of the right vertebral artery proximal to the right PICA origin.  Ectatic left vertebral artery.  Nonvisualization left PICA.  Mild to moderate focal stenosis proximal basilar artery.  Nonvisualization AICAs.  Poor delineation superior cerebellar arteries.  Prominent stenosis posterior cerebral artery bilaterally.  Cannot exclude tiny medially directed left periophthalmic aneurysm versus tortuous vessel.  Motion degradation limits evaluation.  IMPRESSION: Motion degraded exam with significant intracranial atherosclerotic type changes as noted above.  Cannot exclude tiny medially directed left periophthalmic aneurysm versus tortuous vessel.  Motion degradation limits evaluation.   Original Report Authenticated By: Lacy Duverney, M.D.     Microbiology: Recent Results (from the past 240 hour(s))  URINE  CULTURE     Status: Normal   Collection Time   02/23/12  1:34 PM      Component Value Range Status Comment   Specimen Description URINE, RANDOM   Final    Special Requests ADDED [id number]   Final    Culture  Setup Time 02/23/2012 18:59   Final    Colony Count NO GROWTH   Final    Culture NO GROWTH   Final    Report Status 02/24/2012 FINAL   Final      Labs: Basic Metabolic Panel:  Lab 02/25/12 0454 02/24/12 0610 02/23/12 2034 02/23/12 1305 02/23/12 1240  NA 139 139 -- 139 139  K 4.3 3.4* -- 3.8 3.7  CL 100 100 -- 100 98  CO2 28 30 -- -- 30  GLUCOSE 98 97 -- 96 97  BUN 15 11 -- 11 11  CREATININE 0.91 0.81 0.76 0.90 0.81  CALCIUM 9.0 9.3 -- -- 10.6*  MG -- -- -- -- --  PHOS -- -- -- -- --   Liver Function Tests:  Lab 02/23/12 1240  AST 24  ALT 21  ALKPHOS 71  BILITOT 0.4  PROT 7.5  ALBUMIN 3.9   No results found for this basename: LIPASE:5,AMYLASE:5 in the last 168 hours No results found for this basename: AMMONIA:5 in the last 168 hours CBC:  Lab 02/24/12 0610 02/23/12 2034 02/23/12 1305 02/23/12 1240  WBC 5.4 8.7 -- 7.7  NEUTROABS -- -- -- 5.4  HGB 10.9* 12.1 11.6* 11.4*  HCT 33.0* 35.9* 34.0* 34.0*  MCV 90.9 89.8 -- 91.2  PLT 239 264 -- 287  Cardiac Enzymes:  Lab 02/23/12 1248  CKTOTAL --  CKMB --  CKMBINDEX --  TROPONINI <0.30   BNP: BNP (last 3 results) No results found for this basename: PROBNP:3 in the last 8760 hours CBG:  Lab 02/23/12 1317  GLUCAP 94       Signed:  THOMPSON,DANIEL  [facility] 02/25/2012, 1:53 PM

## 2012-02-25 NOTE — Progress Notes (Signed)
Physical Therapy Treatment Patient Details Name: Amanda Hogan MRN: [id number] DOB: [birth year] Today's Date: 02/25/2012 Time: 1191-4782 PT Time Calculation (min): 16 min  PT Assessment / Plan / Recommendation Comments on Treatment Session  Pt progressing well, cognition at baseline. Pt with less LLE pain this session tolerating increased weight bearing. Able to complete stairs with supervision assist only. Plan for pt to d/c home today    Follow Up Recommendations  Home health PT;Supervision - Intermittent     Does the patient have the potential to tolerate intense rehabilitation     Barriers to Discharge        Equipment Recommendations  None recommended by PT    Recommendations for Other Services    Frequency     Plan All goals met and education completed, patient dischaged from PT services    Precautions / Restrictions Precautions Precautions: Fall Restrictions Weight Bearing Restrictions: No   Pertinent Vitals/Pain No complaints of pain    Mobility  Bed Mobility Bed Mobility: Sit to Supine Sit to Supine: 6: Modified independent (Device/Increase time) Details for Bed Mobility Assistance: Pt with increased time and effort, although no physical assist needed Transfers Transfers: Sit to Stand;Stand to Sit Sit to Stand: 6: Modified independent (Device/Increase time);With upper extremity assist;From toilet;From chair/3-in-1 Stand to Sit: To bed Details for Transfer Assistance: Increased time with cues for safe hand placement, no physical assist needed Ambulation/Gait Ambulation/Gait Assistance: 5: Supervision Ambulation Distance (Feet): 150 Feet Assistive device: Rolling walker Ambulation/Gait Assistance Details: Supervision for safety. Cueing throughout for upright posture. Decreased pain in LLE this session. Gait Pattern: Step-to pattern;Decreased stride length;Decreased weight shift to left;Decreased stance time - left;Shuffle Gait velocity: slow gait  speed Stairs: Yes Stairs Assistance: 5: Supervision Stairs Assistance Details (indicate cue type and reason): VC for proper sequencing with strong leg leading and weak leg following as well as following with weak on descent.  Stair Management Technique: One rail Right;Step to pattern;Forwards Number of Stairs: 5     Exercises     PT Diagnosis:    PT Problem List:   PT Treatment Interventions:     PT Goals Acute Rehab PT Goals PT Goal: Sit to Supine/Side - Progress: Met PT Goal: Sit to Stand - Progress: Met PT Goal: Stand to Sit - Progress: Met PT Goal: Ambulate - Progress: Met PT Goal: Up/Down Stairs - Progress: Met  Visit Information  Last PT Received On: 02/25/12 Assistance Needed: +1    Subjective Data  Subjective: Pt in bathroom independently upon arrival   Cognition  Overall Cognitive Status: History of cognitive impairments - at baseline Arousal/Alertness: Awake/alert Orientation Level: Oriented X4 / Intact Behavior During Session: [facility] for tasks performed    Balance     End of Session PT - End of Session Equipment Utilized During Treatment: Gait belt Activity Tolerance: Patient tolerated treatment well Patient left: in bed;with call bell/phone within reach Nurse Communication: Mobility status   GP     [name] 02/25/2012, 10:36 AM

## 2012-02-25 NOTE — Evaluation (Signed)
Occupational Therapy Evaluation Patient Details Name: Amanda Hogan MRN: [id number] DOB: 18-Feb-1929 Today's Date: 02/25/2012 Time: 4098-1191 OT Time Calculation (min): 24 min  OT Assessment / Plan / Recommendation Clinical Impression  Pleasant 78 yr old female admitted with some consfusion and diagnosis of encephalopathy.  Now with no confusion and overall modified independent to supervision for simulated selfcare tasks.  Feel she is at baseline for selfcare and functional transfers.  Has all needed DME, no further OT needs.    OT Assessment  Patient does not need any further OT services    Follow Up Recommendations  No OT follow up       Equipment Recommendations  None recommended by OT          Precautions / Restrictions Precautions Precautions: Fall Restrictions Weight Bearing Restrictions: No   Pertinent Vitals/Pain Pain in 4/10 in the LLE, repositioned with good results    ADL  Eating/Feeding: Simulated;Independent Where Assessed - Eating/Feeding: Chair Grooming: Performed;Wash/dry hands;Teeth care;Brushing hair;Supervision/safety Where Assessed - Grooming: Unsupported standing Upper Body Bathing: Simulated;Set up Where Assessed - Upper Body Bathing: Unsupported sitting Lower Body Bathing: Simulated;Set up Where Assessed - Lower Body Bathing: Unsupported sit to stand Upper Body Dressing: Simulated;Set up Where Assessed - Upper Body Dressing: Unsupported sitting Lower Body Dressing: Simulated;Set up Where Assessed - Lower Body Dressing: Unsupported sit to stand Toilet Transfer: Performed;Supervision/safety Toilet Transfer Method: Other (comment) (Ambulate with RW) Toilet Transfer Equipment: Comfort height toilet Toileting - Clothing Manipulation and Hygiene: Simulated;Supervision/safety Where Assessed - Toileting Clothing Manipulation and Hygiene: Sit to stand from 3-in-1 or toilet Tub/Shower Transfer: Simulated;Supervision/safety Tub/Shower Transfer Method:  Science writer: Counsellor Used: Rolling walker Transfers/Ambulation Related to ADLs: Pt is overall supervison for mobility in the room with her RW. ADL Comments: Pt doing well overall, just supervision for mobility and selfcare with use of the RW.  Already has a tub bench for use in the shower.  Did need min instructional cueing to keep her walker in front of her until she turns all the way around to the seat.      Visit Information  Last OT Received On: 02/25/12 Assistance Needed: +1    Subjective Data  Subjective: I guess all this happened when I fell. Patient Stated Goal: Hopefully to go home soon   Prior Functioning     Home Living Lives With: Spouse Available Help at Discharge: Personal care attendant (10 a-8 p 5 days/wk) Type of Home: House Home Access: Stairs to enter [employer] of Steps: 5 Entrance Stairs-Rails: Right Home Layout: One level Bathroom Shower/Tub: Forensic scientist: Standard Bathroom Accessibility: Yes How Accessible: Accessible via walker Home Adaptive Equipment: Walker - rolling;Quad cane;Straight cane;Tub transfer bench Additional Comments: per daughter, pt has been rolling around on rolling chair (?4 wheeled walker the way she describes) for last couple of weeks due to LLE pain/cellulitis; uses tub seat Prior Function Level of Independence: Needs assistance Needs Assistance: Light Housekeeping Light Housekeeping: Minimal Able to Take Stairs?: Yes Driving: Yes Vocation: Retired Musician: HOH         Vision/Perception Vision - Naval architect: Within Systems developer Vision Assessment: Vision not tested Perception Perception: Within Functional Limits Praxis Praxis: Intact   Cognition  Overall Cognitive Status: Appears within functional limits for tasks assessed/performed Arousal/Alertness: Awake/alert Orientation Level: Appears  intact for tasks assessed Behavior During Session: [facility] for tasks performed    Extremity/Trunk Assessment Right Upper Extremity Assessment RUE ROM/Strength/Tone: Within functional levels  RUE Sensation: WFL - Light Touch RUE Coordination: WFL - gross/fine motor Left Upper Extremity Assessment LUE ROM/Strength/Tone: Within functional levels LUE Sensation: WFL - Light Touch LUE Coordination: WFL - gross/fine motor Trunk Assessment Trunk Assessment: Normal     Mobility Bed Mobility Bed Mobility: Supine to Sit Sit to Supine: 6: Modified independent (Device/Increase time) Details for Bed Mobility Assistance: Pt with increased time and effort, although no physical assist needed Transfers Transfers: Sit to Stand Sit to Stand: 6: Modified independent (Device/Increase time);From toilet;Without upper extremity assist Stand to Sit: 6: Modified independent (Device/Increase time);Without upper extremity assist Details for Transfer Assistance: Increased time with cues for safe hand placement, no physical assist needed           Balance Balance Balance Assessed: Yes Static Standing Balance Static Standing - Balance Support: No upper extremity supported Static Standing - Level of Assistance: 5: Stand by assistance   End of Session OT - End of Session Activity Tolerance: Patient tolerated treatment well Patient left: in bed;with call bell/phone within reach Nurse Communication: Mobility status     Swan Zayed OTR/L Pager number [phone] 02/25/2012, 10:58 AM

## 2012-02-25 NOTE — Progress Notes (Signed)
Subjective: Patient feels she is doing much better.  She is now ambulatory and speech is back to baseline.  Family is in the room and feels she is doing much better as well.  MRI of there brain has been reviewed and shows no acute changes.  Carotid oppler shows no significant stenosis.  Echo shows no evidence of intracardiac masses or thrombi.    Objective: Current vital signs: BP 141/55  Pulse 75  Temp 98.5 F (36.9 C) (Oral)  Resp 20  Ht 5\' 4"  (1.626 m)  Wt 86.5 kg (190 lb 11.2 oz)  BMI 32.73 kg/m2  SpO2 94% Vital signs in last 24 hours: Temp:  [98 F (36.7 C)-98.7 F (37.1 C)] 98.5 F (36.9 C) (12/07 1054) Pulse Rate:  [75-82] 75  (12/07 1054) Resp:  [18-20] 20  (12/07 1054) BP: (112-142)/(44-64) 141/55 mmHg (12/07 1054) SpO2:  [94 %] 94 % (12/07 1054)  Intake/Output from previous day: 12/06 0701 - 12/07 0700 In: 660 [P.O.:660] Out: -  Intake/Output this shift:   Nutritional status: Cardiac  Neurologic Exam: Mental Status:  Alert, oriented, thought content appropriate. Speech fluent without evidence of aphasia. Able to follow 3 step commands without difficulty.  Cranial Nerves:  II: Discs flat bilaterally; Visual fields grossly normal, pupils equal, round, reactive to light and accommodation  III,IV, VI: ptosis not present, extra-ocular motions intact bilaterally  V,VII: smile symmetric, facial light touch sensation normal bilaterally  VIII: hearing decreasedl bilaterally  IX,X: gag reflex present  XI: bilateral shoulder shrug  XII: midline tongue extension  Motor:  Right : Upper extremity 5/5         Left: Upper extremity 5/5   Lower extremity 5/5       Lower extremity 5/5  Tone and bulk:normal tone throughout; no atrophy noted  Sensory: Pinprick and light touch intact throughout, bilaterally  Deep Tendon Reflexes: 2+ and symmetric with absent AJ's bilaterally  Plantars:  Right: downgoing       Left: downgoing  Cerebellar:  normal finger-to-nose    Lab  Results: Basic Metabolic Panel:  Lab 02/25/12 1610 02/24/12 0610 02/23/12 2034 02/23/12 1305 02/23/12 1240  NA 139 139 -- 139 139  K 4.3 3.4* -- 3.8 3.7  CL 100 100 -- 100 98  CO2 28 30 -- -- 30  GLUCOSE 98 97 -- 96 97  BUN 15 11 -- 11 11  CREATININE 0.91 0.81 0.76 0.90 0.81  CALCIUM 9.0 9.3 -- -- 10.6*  MG -- -- -- -- --  PHOS -- -- -- -- --    Liver Function Tests:  Lab 02/23/12 1240  AST 24  ALT 21  ALKPHOS 71  BILITOT 0.4  PROT 7.5  ALBUMIN 3.9   No results found for this basename: LIPASE:5,AMYLASE:5 in the last 168 hours No results found for this basename: AMMONIA:3 in the last 168 hours  CBC:  Lab 02/24/12 0610 02/23/12 2034 02/23/12 1305 02/23/12 1240  WBC 5.4 8.7 -- 7.7  NEUTROABS -- -- -- 5.4  HGB 10.9* 12.1 11.6* 11.4*  HCT 33.0* 35.9* 34.0* 34.0*  MCV 90.9 89.8 -- 91.2  PLT 239 264 -- 287    Cardiac Enzymes:  Lab 02/23/12 1248  CKTOTAL --  CKMB --  CKMBINDEX --  TROPONINI <0.30    Lipid Panel:  Lab 02/24/12 0610 02/23/12 1559  CHOL 232* 270*  TRIG 338* 233*  HDL 40 53  CHOLHDL 5.8 5.1  VLDL 68* 47*  LDLCALC 124* 960*    CBG:  Lab 02/23/12 1317  GLUCAP 94    Microbiology: Results for orders placed during the hospital encounter of 02/23/12  URINE CULTURE     Status: Normal   Collection Time   02/23/12  1:34 PM      Component Value Range Status Comment   Specimen Description URINE, RANDOM   Final    Special Requests ADDED [id number]   Final    Culture  Setup Time 02/23/2012 18:59   Final    Colony Count NO GROWTH   Final    Culture NO GROWTH   Final    Report Status 02/24/2012 FINAL   Final     Coagulation Studies:  Basename 02/23/12 1240  LABPROT 13.0  INR 0.99    Imaging: Dg Chest 2 View  02/24/2012  *RADIOLOGY REPORT*  Clinical Data: Stroke, weakness, high blood pressure  CHEST - 2 VIEW  Comparison: Portable chest x-ray of 07/21/2011  Findings: There is little change in aeration.  No infiltrate or effusion is seen.   The heart is mildly enlarged.  No acute bony abnormality is seen.  IMPRESSION: Stable chest x-ray.  Mild cardiomegaly.   Original Report Authenticated By: Dwyane Dee, M.D.    Mr Angiogram Head Wo Contrast  02/23/2012  *RADIOLOGY REPORT*  Clinical Data:  Hypertension. Dementia.  Prior strokes.  Altered mental status.  MRI BRAIN WITHOUT CONTRAST MRA HEAD WITHOUT CONTRAST  Technique: Multiplanar, multiecho pulse sequences of the brain and surrounding structures were obtained according to standard protocol without intravenous contrast.  Angiographic images of the head were obtained using MRA technique without contrast.  Comparison: 02/23/2012 CT.  06/01/2009 MR.  02/22/2006 MR and MR angiogram.  MRI HEAD  Findings:  No acute infarct.  Remote right temporal - frontal lobe moderate size infarct with encephalomalacia.  Remote small basal ganglia/corona radiata infarcts. Remote small right cerebellar infarct.  Prominent small vessel disease type changes.  No intracranial mass lesion detected on this unenhanced exam. Mild global atrophy without hydrocephalus.  Cervical spondylotic changes.  IMPRESSION: No acute infarct.  Please see above.  MRA HEAD  Findings: Motion degraded exam.  Suggestion of significant stenosis involving middle cerebral artery and A2 segment anterior cerebral artery branches bilaterally.  Stenosis proximal A1 segment left anterior cerebral artery. Stenosis mid to distal A1 segment left anterior cerebral artery. Stenosis distal M1 segment/right middle cerebral artery bifurcation.  Right vertebral artery ends in a PICA distribution.  Narrowing of the right vertebral artery proximal to the right PICA origin.  Ectatic left vertebral artery.  Nonvisualization left PICA.  Mild to moderate focal stenosis proximal basilar artery.  Nonvisualization AICAs.  Poor delineation superior cerebellar arteries.  Prominent stenosis posterior cerebral artery bilaterally.  Cannot exclude tiny medially directed left  periophthalmic aneurysm versus tortuous vessel.  Motion degradation limits evaluation.  IMPRESSION: Motion degraded exam with significant intracranial atherosclerotic type changes as noted above.  Cannot exclude tiny medially directed left periophthalmic aneurysm versus tortuous vessel.  Motion degradation limits evaluation.   Original Report Authenticated By: Lacy Duverney, M.D.    Mr Brain Wo Contrast  02/23/2012  *RADIOLOGY REPORT*  Clinical Data:  Hypertension. Dementia.  Prior strokes.  Altered mental status.  MRI BRAIN WITHOUT CONTRAST MRA HEAD WITHOUT CONTRAST  Technique: Multiplanar, multiecho pulse sequences of the brain and surrounding structures were obtained according to standard protocol without intravenous contrast.  Angiographic images of the head were obtained using MRA technique without contrast.  Comparison: 02/23/2012 CT.  06/01/2009 MR.  02/22/2006 MR and  MR angiogram.  MRI HEAD  Findings:  No acute infarct.  Remote right temporal - frontal lobe moderate size infarct with encephalomalacia.  Remote small basal ganglia/corona radiata infarcts. Remote small right cerebellar infarct.  Prominent small vessel disease type changes.  No intracranial mass lesion detected on this unenhanced exam. Mild global atrophy without hydrocephalus.  Cervical spondylotic changes.  IMPRESSION: No acute infarct.  Please see above.  MRA HEAD  Findings: Motion degraded exam.  Suggestion of significant stenosis involving middle cerebral artery and A2 segment anterior cerebral artery branches bilaterally.  Stenosis proximal A1 segment left anterior cerebral artery. Stenosis mid to distal A1 segment left anterior cerebral artery. Stenosis distal M1 segment/right middle cerebral artery bifurcation.  Right vertebral artery ends in a PICA distribution.  Narrowing of the right vertebral artery proximal to the right PICA origin.  Ectatic left vertebral artery.  Nonvisualization left PICA.  Mild to moderate focal stenosis  proximal basilar artery.  Nonvisualization AICAs.  Poor delineation superior cerebellar arteries.  Prominent stenosis posterior cerebral artery bilaterally.  Cannot exclude tiny medially directed left periophthalmic aneurysm versus tortuous vessel.  Motion degradation limits evaluation.  IMPRESSION: Motion degraded exam with significant intracranial atherosclerotic type changes as noted above.  Cannot exclude tiny medially directed left periophthalmic aneurysm versus tortuous vessel.  Motion degradation limits evaluation.   Original Report Authenticated By: Lacy Duverney, M.D.     Medications:  I have reviewed the patient's current medications. Scheduled:   . amLODipine  10 mg Oral Daily  . amoxicillin-clavulanate  1 tablet Oral BID  . citalopram  40 mg Oral Daily  . clopidogrel  75 mg Oral Daily  . enoxaparin  40 mg Subcutaneous Q24H  . meloxicam  7.5 mg Oral BID  . memantine  10 mg Oral BID  . pantoprazole  40 mg Oral Q0600  . rOPINIRole  0.5 mg Oral QHS  . simvastatin  20 mg Oral q1800  . trimethoprim  100 mg Oral QHS    Assessment/Plan:  Patient Active Hospital Problem List: Unspecified transient cerebral ischemia (02/23/2012)   Assessment: Patient with transient episode of confusion and difficulty with gait.  Feels that she is now back to baseline.  MRI unremarkable.  Event likely a TIA.  Work up unremarkable.  Patient on Plavix home.     Plan:  1.  Would continue Plavix and start ASA 81mg  daily in addition as well.   2.  No further work up recommended at this time.      LOS: 2 days   Thana Farr, MD [facility] [phone] 02/25/2012  12:38 PM

## 2012-03-27 ENCOUNTER — Encounter (HOSPITAL_COMMUNITY): Payer: Self-pay | Admitting: General Practice

## 2012-05-24 ENCOUNTER — Encounter: Payer: Self-pay | Admitting: Cardiology

## 2012-05-24 DIAGNOSIS — I10 Essential (primary) hypertension: Secondary | ICD-10-CM

## 2012-06-13 ENCOUNTER — Encounter: Payer: Self-pay | Admitting: Cardiovascular Disease

## 2012-06-14 ENCOUNTER — Ambulatory Visit: Payer: Self-pay | Admitting: Neurology

## 2012-06-22 ENCOUNTER — Telehealth: Payer: Self-pay | Admitting: Family Medicine

## 2012-06-22 NOTE — Telephone Encounter (Signed)
[facility] nurse) aware

## 2012-06-22 NOTE — Telephone Encounter (Signed)
It will increase risk of stomach bleeds due to plavix.  If they are okay with the risk, she can proceed.

## 2012-11-30 ENCOUNTER — Inpatient Hospital Stay: Payer: Self-pay | Admitting: Internal Medicine

## 2012-11-30 LAB — COMPREHENSIVE METABOLIC PANEL
Albumin: 3.8 g/dL (ref 3.4–5.0)
Alkaline Phosphatase: 157 U/L — ABNORMAL HIGH (ref 50–136)
BUN: 27 mg/dL — ABNORMAL HIGH (ref 7–18)
Bilirubin,Total: 0.6 mg/dL (ref 0.2–1.0)
Calcium, Total: 9 mg/dL (ref 8.5–10.1)
Co2: 26 mmol/L (ref 21–32)
Creatinine: 1.63 mg/dL — ABNORMAL HIGH (ref 0.60–1.30)
EGFR (Non-African Amer.): 29 — ABNORMAL LOW
Glucose: 120 mg/dL — ABNORMAL HIGH (ref 65–99)
Potassium: 3.4 mmol/L — ABNORMAL LOW (ref 3.5–5.1)
SGPT (ALT): 67 U/L (ref 12–78)

## 2012-11-30 LAB — URINALYSIS, COMPLETE
Blood: NEGATIVE
Hyaline Cast: 42
Ketone: NEGATIVE
Nitrite: NEGATIVE
Protein: 30
RBC,UR: 4 /HPF (ref 0–5)
Specific Gravity: 1.02 (ref 1.003–1.030)
Transitional Epi: 2
WBC UR: 49 /HPF (ref 0–5)

## 2012-11-30 LAB — LIPASE, BLOOD: Lipase: 162 U/L (ref 73–393)

## 2012-11-30 LAB — CBC: MCH: 30.4 pg (ref 26.0–34.0)

## 2012-12-01 LAB — BASIC METABOLIC PANEL
Anion Gap: 6 — ABNORMAL LOW (ref 7–16)
Calcium, Total: 8.4 mg/dL — ABNORMAL LOW (ref 8.5–10.1)
Chloride: 106 mmol/L (ref 98–107)
Glucose: 97 mg/dL (ref 65–99)
Osmolality: 277 (ref 275–301)
Potassium: 3.4 mmol/L — ABNORMAL LOW (ref 3.5–5.1)

## 2012-12-02 LAB — BASIC METABOLIC PANEL
Calcium, Total: 8.2 mg/dL — ABNORMAL LOW (ref 8.5–10.1)
Chloride: 109 mmol/L — ABNORMAL HIGH (ref 98–107)
Co2: 23 mmol/L (ref 21–32)
EGFR (African American): >60
Glucose: 115 mg/dL — ABNORMAL HIGH (ref 65–99)
Osmolality: 277 (ref 275–301)
Sodium: 138 mmol/L (ref 136–145)

## 2013-01-25 ENCOUNTER — Telehealth: Payer: Self-pay | Admitting: Family Medicine

## 2013-01-25 MED ORDER — ROPINIROLE HCL 0.5 MG PO TABS: 0.5000 mg | ORAL_TABLET | Freq: Every day | ORAL | Status: DC

## 2013-01-25 NOTE — Telephone Encounter (Signed)
Rx Refilled  

## 2013-01-25 NOTE — Telephone Encounter (Signed)
ropinirole hcl 0.5mg  #30 Take one tablet by mouth at nigth at bedtime

## 2013-01-28 ENCOUNTER — Ambulatory Visit
Admission: RE | Admit: 2013-01-28 | Discharge: 2013-01-28 | Disposition: A | Discharge status: Home / Self Care | Payer: Medicare Other | Source: Ambulatory Visit | Attending: Nurse Practitioner | Admitting: Nurse Practitioner

## 2013-01-28 ENCOUNTER — Other Ambulatory Visit: Payer: Self-pay | Admitting: Nurse Practitioner

## 2013-01-28 DIAGNOSIS — M79604 Pain in right leg: Secondary | ICD-10-CM

## 2013-03-26 ENCOUNTER — Telehealth: Payer: Self-pay | Admitting: Family Medicine

## 2013-03-26 NOTE — Telephone Encounter (Signed)
Supposed to be on 0.5 mg poqhs.

## 2013-03-26 NOTE — Telephone Encounter (Signed)
[employer] faxed back dose confirmation   Requip 0.5 mg at bedtime

## 2013-03-26 NOTE — Telephone Encounter (Signed)
State patient had received Requip 2 mg dose at retail  And want to confirm 0.5 mg dose

## 2013-06-03 ENCOUNTER — Other Ambulatory Visit: Payer: Self-pay | Admitting: Family Medicine

## 2013-06-03 MED ORDER — AMLODIPINE BESYLATE 10 MG PO TABS: 10.0000 mg | ORAL_TABLET | Freq: Every day | ORAL | Status: DC

## 2013-06-03 NOTE — Telephone Encounter (Signed)
Rx Refilled  

## 2014-07-11 NOTE — H&P (Signed)
PATIENT NAME:  Amanda Hogan, Amanda Hogan MR#:  [id number] DATE OF BIRTH:  September 27, 1928  DATE OF ADMISSION:  11/30/2012  REFERRING PHYSICIAN: Elta Guadeloupe R. Jacqualine Code, MD  PRIMARY CARE PHYSICIAN:   HISTORY OF PRESENT ILLNESS: Amanda Hogan is an 81 year old Caucasian female with past medical history of coronary artery disease, hypertension, hyperlipidemia, history of TIAs, history of sinus cancer status post chemotherapy and radiation back in 2000, as well as early dementia and recurrent UTIs on trimethoprim suppression. She is presenting with nausea, vomiting and diarrhea for 1 week duration. Her symptoms originally began with URI-like symptoms. She noted positive sick contacts with similar symptomatology. Her URI-like symptoms have been resolving, but she has been having multiple bouts of diarrhea daily, which she describes as loose stools without blood or mucus approximately 3 to 5 times daily with associated nausea and a few bouts of emesis, which has been slowly improving. She describes the emesis as nonbloody and nonbilious. Since her symptoms have persisted, she has been unable to tolerate p.o. and she has been gradually experiencing generalized weakness. Aside from her suppressive antibiotic for her UTIs, she was also given an unknown antibiotic about 1 month ago for a cold (in her words). Then, she was recently diagnosed with a UTI at urgent care and started on Macrobid just yesterday, though she has no symptoms of dysuria or urinary frequency. In the Emergency Department, she was found to have acute kidney injury with a creatinine of 1.6 with a baseline creatinine of around 0.81.   REVIEW OF SYSTEMS: CONSTITUTIONAL: She denies any fevers or fatigue. She does describe generalized weakness.  EYES: Denies any blurred vision or eye pain.  EARS, NOSE, THROAT: Denies dysphagia or hearing loss.  RESPIRATORY: Denies cough or wheeze.  CARDIOVASCULAR: Denies chest pain or palpitations.  GASTROINTESTINAL: Nausea, vomiting,  diarrhea as above. She denies any abdominal pain.  GENITOURINARY: She denies any dysuria, hematuria or increased frequency.  ENDOCRINE: She denies any nocturia or thyroid problems.  HEMATOLOGIC AND LYMPHATIC: She denies any easy bruising or bleeding.  MUSCULOSKELETAL: She denies neck or back pain.  NEUROLOGICAL: She denies any paralysis or paresthesias.  PSYCHIATRIC: She denies any anxiety or depressive symptoms.   PAST MEDICAL HISTORY: Coronary artery disease, hypertension, history of TIAs, history of sinus cancer status post chemotherapy and radiation back in 2000, recurrent UTIs on trimethoprim suppression, as well as early dementia.   FAMILY HISTORY: Significant for osteoarthritis. Otherwise, she denies any gastroenterology pathology.   SOCIAL HISTORY: She denies any alcohol, tobacco or drug usage.   ALLERGIES: IV CONTRAST DYE.   HOME MEDICATIONS: Include Norvasc 10 mg p.o. daily, aspirin 81 mg p.o. daily, citalopram 20 mg p.o. daily, Plavix 75 mg p.o. daily. She was recently on Macrobid 100 mg p.o. b.i.d. for a 10 day duration which was started yesterday. Namenda 10 mg p.o. b.i.d., pantoprazole 40 mg p.o. daily, Requip 0.5 mg p.o. at bedtime, simvastatin 20 mg p.o. at bedtime and trimethoprim 100 mg p.o. at bedtime.   PHYSICAL EXAMINATION: VITAL SIGNS: Temperature 97.8 Fahrenheit, heart rate 74, respirations 20, blood pressure 133/73, saturating 93% on room air. BMI 32.5.  GENERAL: No acute distress, Awake, alert and oriented x 3.  HEENT: Normocephalic, atraumatic. Extraocular muscles intact. Pupils equal, round and reactive to light as well as accommodation. Moist mucosal membranes.  NECK: No lymphadenopathy, no thyromegaly.  CARDIOVASCULAR: S1, S2, regular rate and rhythm. No murmurs, rubs or gallops.  LUNGS: Clear to auscultation bilaterally without wheezes, rubs or rhonchi.  ABDOMEN: Soft, nontender, nondistended,  positive bowel sounds.  EXTREMITIES: Reveal no cyanosis, edema or  clubbing.  NEUROLOGIC: Cranial nerves II through XII intact. No gross neurological deficits.   LABORATORY DATA: Sodium 132, potassium 3.4, chloride 101, bicarbonate 26, BUN 27, creatinine 1.63 with baseline creatinine of between 0.8 and 0.9, glucose 120, total protein 7.8, albumin 3.8, total bilirubin 0.6, alkaline phosphatase 137, AST 66, ALT 67. WBC 59, hemoglobin 12, platelets 299. Urinalysis: 2+ leukocyte esterase, nitrite negative, WBCs 49 epithelial cells 5.   ASSESSMENT AND PLAN: An 81 year old Caucasian female with past medical history of coronary artery disease, hypertension, transient ischemic attacks, as well as a history of sinus cancer status post chemotherapy and radiation, as well as recurrent UTIs on trimethoprim suppression, presenting with nausea, vomiting, diarrhea for over 1 week duration. She was found to have acute kidney injury upon basic work-up. 1.  Acute kidney injury: Likely prerenal in nature given her history. Would hold trimethoprim as well as any other nephrotoxic agents. IV fluid hydration and recheck her renal function in the morning. 2.  Nausea, vomiting and diarrhea: Likely viral gastroenteritis, though given her association with recent antibiotic use, would check a C. difficile as well as fecal leukocytes. If those are negative, she can use antidiarrheals and further symptom control with Zofran. Oral diet as tolerated.  3.  Hypertension: Continue with Norvasc. 4.  Coronary artery disease: Continue aspirin and statin.  5.  History of transient ischemic attacks: Continue aspirin as well as Plavix. 6.  Dementia: Continue Requip and Namenda. 7.  Depression: Continue with Celexa.   CODE STATUS: The patient is full code.   TOTAL TIME SPENT: 33 minutes.   ____________________________ Aaron Mose. Taylor Levick, MD dkh:jm D: 11/30/2012 20:53:26 ET T: 11/30/2012 22:14:33 ET JOB#: [id number]  cc: Aaron Mose. Amreen Raczkowski, MD, <Dictator> Monique Gift Woodfin Ganja MD ELECTRONICALLY SIGNED 12/01/2012  21:16

## 2014-07-11 NOTE — Discharge Summary (Signed)
PATIENT NAME:  Amanda Hogan, WIST MR#:  [id number] DATE OF BIRTH:  1928/11/24  DATE OF ADMISSION:  11/30/2012 DATE OF DISCHARGE:  12/02/2012  PRIMARY CARE PHYSICIAN: None local.  DISCHARGE DIAGNOSES: Urinary tract infection, acute renal failure, hypokalemia, hypomagnesemia, hypertension, coronary artery disease.   CODE STATUS: Full code.   MEDICATIONS: Norvasc 10 mg p.o. daily, aspirin 81 mg p.o. daily, citalopram 20 mg p.o. daily, Plavix 75 mg p.o. daily, Namenda 10 mg p.o. b.i.d., Protonix 40 mg p.o. daily, ropinirole 0.5 mg p.o. at bedtime, Zocor 20 mg p.o. daily at bedtime, Augmentin 875 mg/125 mg p.o. tablets every 12 hours for 3 days.   DIET: Low-sodium, low-fat, low-cholesterol diet.   ACTIVITY: As tolerated.   FOLLOWUP CARE: Follow up with PCP within 1 to 2 weeks.   REASON FOR ADMISSION: Multiple diarrhea.   HOSPITAL COURSE: The patient is an 81 year old Caucasian female with a history of hypertension, CAD, hyperlipidemia, and also the patient has dementia and recurrent UTI. The patient presented to the ED with nausea, vomiting and diarrhea for 1 week. The patient also has generalized weakness. The patient was found to have acute renal failure with creatinine 1.6. For detailed history and physical examination, please refer to the admission note dictated by Dr. Lavetta Nielsen. On admission date, the patient's urinalysis showed 2+ leukocytes; nitrite was negative, WBC 49. Sodium 132, potassium 3.4, BUN 27, creatinine 1.63. 1.  Acute renal failure: The patient has been treated with IV fluid support. Renal function became normal.  2.  For nausea, vomiting and diarrhea, which is possibly due to UTI versus viral gastroenteritis, the patient had no obvious diarrhea after admission so C. diff test could not be sent. The patient was treated with Augmentin with IV fluid support.  3.  Hypertension: Has been treated with Norvasc.  4.  Hypokalemia and hypomagnesemia: The patient developed hypokalemia and  hypomagnesemia, which is possibly secondary to nausea, vomiting and diarrhea. The patient was treated with supplements. Hypokalemia has improved. The patient's  magnesium level is 1.5 today and was treated with magnesium supplement.   The patient is clinically stable and will be discharged to home today. I discussed the patient's discharge plan with the patient, nurse and case manager.   TIME SPENT: About 35 minutes.   ____________________________ Demetrios Loll, MD qc:jm D: 12/02/2012 14:41:54 ET T: 12/02/2012 19:49:04 ET JOB#: [id number]  cc: Demetrios Loll, MD, <Dictator> Demetrios Loll MD ELECTRONICALLY SIGNED 12/03/2012 15:46

## 2014-09-17 ENCOUNTER — Other Ambulatory Visit: Payer: Self-pay | Admitting: Family Medicine

## 2014-10-27 ENCOUNTER — Encounter (HOSPITAL_COMMUNITY): Payer: Self-pay | Admitting: *Deleted

## 2014-10-27 ENCOUNTER — Emergency Department (HOSPITAL_COMMUNITY): Payer: PPO

## 2014-10-27 ENCOUNTER — Inpatient Hospital Stay (HOSPITAL_COMMUNITY)
Admission: EM | Admit: 2014-10-27 | Discharge: 2014-11-01 | DRG: 871 | Disposition: A | Discharge status: Skilled Nursing Facility | Payer: PPO | Attending: Internal Medicine | Admitting: Internal Medicine

## 2014-10-27 DIAGNOSIS — R778 Other specified abnormalities of plasma proteins: Secondary | ICD-10-CM | POA: Diagnosis present

## 2014-10-27 DIAGNOSIS — I272 Other secondary pulmonary hypertension: Secondary | ICD-10-CM | POA: Diagnosis present

## 2014-10-27 DIAGNOSIS — R7989 Other specified abnormal findings of blood chemistry: Secondary | ICD-10-CM | POA: Diagnosis present

## 2014-10-27 DIAGNOSIS — I5031 Acute diastolic (congestive) heart failure: Secondary | ICD-10-CM | POA: Diagnosis not present

## 2014-10-27 DIAGNOSIS — C8590 Non-Hodgkin lymphoma, unspecified, unspecified site: Secondary | ICD-10-CM | POA: Diagnosis present

## 2014-10-27 DIAGNOSIS — F0390 Unspecified dementia without behavioral disturbance: Secondary | ICD-10-CM | POA: Diagnosis not present

## 2014-10-27 DIAGNOSIS — Z87440 Personal history of urinary (tract) infections: Secondary | ICD-10-CM

## 2014-10-27 DIAGNOSIS — R079 Chest pain, unspecified: Secondary | ICD-10-CM | POA: Diagnosis present

## 2014-10-27 DIAGNOSIS — Z79899 Other long term (current) drug therapy: Secondary | ICD-10-CM

## 2014-10-27 DIAGNOSIS — F419 Anxiety disorder, unspecified: Secondary | ICD-10-CM | POA: Diagnosis present

## 2014-10-27 DIAGNOSIS — J811 Chronic pulmonary edema: Secondary | ICD-10-CM

## 2014-10-27 DIAGNOSIS — I129 Hypertensive chronic kidney disease with stage 1 through stage 4 chronic kidney disease, or unspecified chronic kidney disease: Secondary | ICD-10-CM | POA: Diagnosis present

## 2014-10-27 DIAGNOSIS — B3749 Other urogenital candidiasis: Secondary | ICD-10-CM | POA: Diagnosis present

## 2014-10-27 DIAGNOSIS — G2581 Restless legs syndrome: Secondary | ICD-10-CM | POA: Diagnosis present

## 2014-10-27 DIAGNOSIS — E876 Hypokalemia: Secondary | ICD-10-CM | POA: Diagnosis not present

## 2014-10-27 DIAGNOSIS — Z7902 Long term (current) use of antithrombotics/antiplatelets: Secondary | ICD-10-CM

## 2014-10-27 DIAGNOSIS — N183 Chronic kidney disease, stage 3 (moderate): Secondary | ICD-10-CM | POA: Diagnosis present

## 2014-10-27 DIAGNOSIS — M797 Fibromyalgia: Secondary | ICD-10-CM | POA: Diagnosis present

## 2014-10-27 DIAGNOSIS — J810 Acute pulmonary edema: Secondary | ICD-10-CM

## 2014-10-27 DIAGNOSIS — I509 Heart failure, unspecified: Secondary | ICD-10-CM | POA: Diagnosis not present

## 2014-10-27 DIAGNOSIS — I519 Heart disease, unspecified: Secondary | ICD-10-CM | POA: Diagnosis not present

## 2014-10-27 DIAGNOSIS — I5189 Other ill-defined heart diseases: Secondary | ICD-10-CM | POA: Diagnosis present

## 2014-10-27 DIAGNOSIS — M549 Dorsalgia, unspecified: Secondary | ICD-10-CM | POA: Diagnosis present

## 2014-10-27 DIAGNOSIS — A419 Sepsis, unspecified organism: Secondary | ICD-10-CM | POA: Diagnosis not present

## 2014-10-27 DIAGNOSIS — I10 Essential (primary) hypertension: Secondary | ICD-10-CM | POA: Diagnosis not present

## 2014-10-27 DIAGNOSIS — R32 Unspecified urinary incontinence: Secondary | ICD-10-CM | POA: Diagnosis present

## 2014-10-27 DIAGNOSIS — Z8673 Personal history of transient ischemic attack (TIA), and cerebral infarction without residual deficits: Secondary | ICD-10-CM | POA: Diagnosis not present

## 2014-10-27 DIAGNOSIS — M1990 Unspecified osteoarthritis, unspecified site: Secondary | ICD-10-CM | POA: Diagnosis present

## 2014-10-27 DIAGNOSIS — J9601 Acute respiratory failure with hypoxia: Secondary | ICD-10-CM | POA: Diagnosis present

## 2014-10-27 DIAGNOSIS — Z7982 Long term (current) use of aspirin: Secondary | ICD-10-CM | POA: Diagnosis not present

## 2014-10-27 DIAGNOSIS — I248 Other forms of acute ischemic heart disease: Secondary | ICD-10-CM | POA: Diagnosis present

## 2014-10-27 DIAGNOSIS — Z66 Do not resuscitate: Secondary | ICD-10-CM | POA: Diagnosis present

## 2014-10-27 DIAGNOSIS — K219 Gastro-esophageal reflux disease without esophagitis: Secondary | ICD-10-CM | POA: Diagnosis present

## 2014-10-27 DIAGNOSIS — J9811 Atelectasis: Secondary | ICD-10-CM | POA: Diagnosis present

## 2014-10-27 DIAGNOSIS — E785 Hyperlipidemia, unspecified: Secondary | ICD-10-CM | POA: Diagnosis present

## 2014-10-27 DIAGNOSIS — Z8572 Personal history of non-Hodgkin lymphomas: Secondary | ICD-10-CM | POA: Diagnosis not present

## 2014-10-27 DIAGNOSIS — G8929 Other chronic pain: Secondary | ICD-10-CM | POA: Diagnosis present

## 2014-10-27 DIAGNOSIS — I4891 Unspecified atrial fibrillation: Secondary | ICD-10-CM | POA: Diagnosis not present

## 2014-10-27 DIAGNOSIS — Z91041 Radiographic dye allergy status: Secondary | ICD-10-CM | POA: Diagnosis not present

## 2014-10-27 DIAGNOSIS — N179 Acute kidney failure, unspecified: Secondary | ICD-10-CM | POA: Diagnosis not present

## 2014-10-27 DIAGNOSIS — Z888 Allergy status to other drugs, medicaments and biological substances status: Secondary | ICD-10-CM | POA: Diagnosis not present

## 2014-10-27 DIAGNOSIS — R0600 Dyspnea, unspecified: Secondary | ICD-10-CM | POA: Diagnosis not present

## 2014-10-27 DIAGNOSIS — R413 Other amnesia: Secondary | ICD-10-CM | POA: Diagnosis present

## 2014-10-27 DIAGNOSIS — I5041 Acute combined systolic (congestive) and diastolic (congestive) heart failure: Secondary | ICD-10-CM | POA: Diagnosis not present

## 2014-10-27 LAB — URINE MICROSCOPIC-ADD ON

## 2014-10-27 LAB — COMPREHENSIVE METABOLIC PANEL WITH GFR
ALT: 22 U/L (ref 14–54)
AST: 20 U/L (ref 15–41)
Albumin: 3.5 g/dL (ref 3.5–5.0)
Alkaline Phosphatase: 92 U/L (ref 38–126)
Anion gap: 8 (ref 5–15)
BUN: 20 mg/dL (ref 6–20)
CO2: 28 mmol/L (ref 22–32)
Calcium: 9 mg/dL (ref 8.9–10.3)
Chloride: 100 mmol/L — ABNORMAL LOW (ref 101–111)
Creatinine, Ser: 0.96 mg/dL (ref 0.44–1.00)
GFR calc Af Amer: >60 mL/min
GFR calc non Af Amer: 53 mL/min — ABNORMAL LOW
Glucose, Bld: 128 mg/dL — ABNORMAL HIGH (ref 65–99)
Potassium: 3.5 mmol/L (ref 3.5–5.1)
Sodium: 136 mmol/L (ref 135–145)
Total Bilirubin: 1.4 mg/dL — ABNORMAL HIGH (ref 0.3–1.2)
Total Protein: 7.9 g/dL (ref 6.5–8.1)

## 2014-10-27 LAB — APTT: APTT: 37 s (ref 24–37)

## 2014-10-27 LAB — CBC WITH DIFFERENTIAL/PLATELET
Basophils Absolute: 0 K/uL (ref 0.0–0.1)
Basophils Relative: 0 % (ref 0–1)
Eosinophils Absolute: 0.1 K/uL (ref 0.0–0.7)
Eosinophils Relative: 1 % (ref 0–5)
HCT: 30.4 % — ABNORMAL LOW (ref 36.0–46.0)
Hemoglobin: 9.8 g/dL — ABNORMAL LOW (ref 12.0–15.0)
Lymphocytes Relative: 11 % — ABNORMAL LOW (ref 12–46)
Lymphs Abs: 1.2 K/uL (ref 0.7–4.0)
MCH: 30.3 pg (ref 26.0–34.0)
MCHC: 32.2 g/dL (ref 30.0–36.0)
MCV: 94.1 fL (ref 78.0–100.0)
Monocytes Absolute: 1 K/uL (ref 0.1–1.0)
Monocytes Relative: 9 % (ref 3–12)
Neutro Abs: 8.7 K/uL — ABNORMAL HIGH (ref 1.7–7.7)
Neutrophils Relative %: 79 % — ABNORMAL HIGH (ref 43–77)
Platelets: 306 K/uL (ref 150–400)
RBC: 3.23 MIL/uL — ABNORMAL LOW (ref 3.87–5.11)
RDW: 14.1 % (ref 11.5–15.5)
WBC: 11 K/uL — ABNORMAL HIGH (ref 4.0–10.5)

## 2014-10-27 LAB — URINALYSIS, ROUTINE W REFLEX MICROSCOPIC
GLUCOSE, UA: NEGATIVE mg/dL
HGB URINE DIPSTICK: NEGATIVE
Ketones, ur: NEGATIVE mg/dL
Nitrite: POSITIVE — AB
Protein, ur: 100 mg/dL — AB
SPECIFIC GRAVITY, URINE: 1.031 — AB (ref 1.005–1.030)
Urobilinogen, UA: 0.2 mg/dL (ref 0.0–1.0)
pH: 5.5 (ref 5.0–8.0)

## 2014-10-27 LAB — PROTIME-INR
INR: 1.17 (ref 0.00–1.49)
PROTHROMBIN TIME: 15 s (ref 11.6–15.2)

## 2014-10-27 LAB — TROPONIN I: Troponin I: 0.25 ng/mL — ABNORMAL HIGH

## 2014-10-27 LAB — I-STAT CG4 LACTIC ACID, ED: Lactic Acid, Venous: 1.37 mmol/L (ref 0.5–2.0)

## 2014-10-27 MED ORDER — FUROSEMIDE 10 MG/ML IJ SOLN
40.0000 mg | Freq: Once | INTRAMUSCULAR | Status: AC
  Administered 2014-10-27: 40 mg via INTRAVENOUS
  Filled 2014-10-27: qty 4

## 2014-10-27 MED ORDER — HEPARIN (PORCINE) IN NACL 100-0.45 UNIT/ML-% IJ SOLN
1000.0000 [IU]/h | INTRAMUSCULAR | Status: DC
  Administered 2014-10-27: 1000 [IU]/h via INTRAVENOUS
  Filled 2014-10-27: qty 250

## 2014-10-27 MED ORDER — ASPIRIN 81 MG PO CHEW
324.0000 mg | CHEWABLE_TABLET | Freq: Once | ORAL | Status: AC
  Administered 2014-10-27: 324 mg via ORAL
  Filled 2014-10-27: qty 4

## 2014-10-27 MED ORDER — LEVOFLOXACIN IN D5W 500 MG/100ML IV SOLN
500.0000 mg | INTRAVENOUS | Status: DC
  Administered 2014-10-28: 500 mg via INTRAVENOUS
  Filled 2014-10-27: qty 100

## 2014-10-27 MED ORDER — LEVOFLOXACIN IN D5W 500 MG/100ML IV SOLN
500.0000 mg | INTRAVENOUS | Status: AC
  Administered 2014-10-27: 500 mg via INTRAVENOUS
  Filled 2014-10-27: qty 100

## 2014-10-27 MED ORDER — HEPARIN BOLUS VIA INFUSION
3500.0000 [IU] | Freq: Once | INTRAVENOUS | Status: AC
  Administered 2014-10-27: 3500 [IU] via INTRAVENOUS
  Filled 2014-10-27: qty 3500

## 2014-10-27 MED ORDER — DEXTROSE 5 % IV SOLN
5.0000 mg/h | INTRAVENOUS | Status: DC
  Administered 2014-10-27: 5 mg/h via INTRAVENOUS
  Administered 2014-10-28: 15 mg/h via INTRAVENOUS
  Filled 2014-10-27 (×3): qty 100

## 2014-10-27 NOTE — ED Notes (Addendum)
Cardizem increased 7.5, HR 135, 152/73, 120

## 2014-10-27 NOTE — ED Notes (Signed)
EMS contacted by the providers office, [facility] in [facility] d/t pt experiencing increased SOB and more lethargic over the last 5 days and minor confusion. [place]. Uncertain whether antibiotics were prescribed.

## 2014-10-27 NOTE — ED Notes (Addendum)
2nd EKG given to Dr Lacinda Axon- alerted that patient is now in A fib

## 2014-10-27 NOTE — ED Notes (Signed)
(  Son) Emelin Dascenzo [phone] ,  [phone]  (Daughter) William Dalton [phone]

## 2014-10-27 NOTE — ED Notes (Signed)
Bed: WA04 Expected date:  Expected time:  Means of arrival:  Comments: Lethargic/[facility]

## 2014-10-27 NOTE — ED Provider Notes (Signed)
CSN: [id number]     Arrival date & time 10/27/14  1130 History   First MD Initiated Contact with Patient 10/27/14 1254     Chief Complaint  Patient presents with  . Shortness of Breath  . Fatigue     (Consider location/radiation/quality/duration/timing/severity/associated sxs/prior Treatment) HPI..... Level V caveat for mild dementia.  Chief complaint dyspnea and chest pain for several days. Patient was admitted to the hospital in [facility] last week from Wednesday to Friday for similar symptoms. She feels weak and has difficulty walking. Vital signs report low-grade fever. Her primary care doctor is Delia Chimes in [place]  Past Medical History  Diagnosis Date  . Hypertension   . Arthritis   . High cholesterol   . Acid reflux   . TIA (transient ischemic attack)   . Restless leg syndrome   . Complication of anesthesia     "w/my back; gave me too much; thought I'd had stroke but didn't"  . Heart murmur   . Stroke     "said it looked like bullet holes; had a bunch"  . Angina   . Exertional dyspnea   . Sinus malignant neoplasm   . Fibromyalgia   . Memory disorder, possibly not taking home meds at times 07/22/2011  . Anxiety 02/23/2012  . Dementia 02/23/2012  . Kidney stone   . HTN (hypertension)   . Mild carotid artery disease 08/05/11    carotid dopplers, mild disease  . Chest pain     negative lexiscan myoview 53/13, last echo 02/24/12-EF 96-283, mid systolic obliteration of the LV, cavity aortic sclerosis   Past Surgical History  Procedure Laterality Date  . Cholecystectomy    . Vaginal hysterectomy    . Breast cyst excision      left  . Hernia repair      "stomach"  . Lung surgery      "made 3 holes and pulled gel out"  . Cardiac catheterization    . Knee cartilage surgery      right  . Lumbar spine surgery      "put box in my lower back"  . Elbow bursa surgery      left  . Cataract extraction w/ intraocular lens  implant, bilateral    . Eye surgery  1940     fixed my crossed eyes"  . Kidney stone surgery      "right side; cut it out"  . Carpal tunnel release      right   No family history on file. History  Substance Use Topics  . Smoking status: Never Smoker   . Smokeless tobacco: Never Used  . Alcohol Use: No   OB History    No data available     Review of Systems  Unable to perform ROS: Dementia      Allergies  Contrast media and Iohexol  Home Medications   Prior to Admission medications   Medication Sig Start Date End Date Taking? Authorizing Provider  acetaminophen (TYLENOL) 500 MG tablet Take 1,000 mg by mouth every 6 (six) hours as needed. For pain    Historical Provider, MD  ALPRAZolam Duanne Moron) 0.5 MG tablet Take 0.5 mg by mouth every 8 (eight) hours as needed. For anxiety    Historical Provider, MD  Alum & Mag Hydroxide-Simeth (GI COCKTAIL) SUSP suspension Take 30 mLs by mouth 3 (three) times daily as needed for indigestion. Shake well. 02/25/12   Eugenie Filler, MD  amLODipine (NORVASC) 10 MG tablet Take 1  tablet (10 mg total) by mouth daily. 06/03/13   Susy Frizzle, MD  aspirin 81 MG tablet Take 1 tablet (81 mg total) by mouth daily. 02/25/12   Eugenie Filler, MD  citalopram (CELEXA) 20 MG tablet Take 1 tablet (20 mg total) by mouth daily. 02/25/12   Eugenie Filler, MD  clopidogrel (PLAVIX) 75 MG tablet Take 75 mg by mouth daily.    Historical Provider, MD  donepezil (ARICEPT) 5 MG tablet Take 5 mg by mouth at bedtime as needed.    Historical Provider, MD  HYDROcodone-acetaminophen (NORCO/VICODIN) 5-325 MG per tablet Take 1 tablet by mouth every 6 (six) hours as needed. As needed for pain.    Historical Provider, MD  HYDROcodone-ibuprofen (VICOPROFEN) 7.5-200 MG per tablet Take 1 tablet by mouth every 8 (eight) hours as needed. As needed for pain.    Historical Provider, MD  meloxicam (MOBIC) 7.5 MG tablet Take 7.5 mg by mouth 2 (two) times daily. Take one tablet twice a day with food for 2 weeks then as  needed. Date filled was 02/22/12    Historical Provider, MD  memantine (NAMENDA) 10 MG tablet Take 10 mg by mouth 2 (two) times daily.    Historical Provider, MD  nitroGLYCERIN (NITROSTAT) 0.4 MG SL tablet Place 0.4 mg under the tongue every 5 (five) minutes as needed. For chest pain    Historical Provider, MD  olmesartan-hydrochlorothiazide (BENICAR HCT) 20-12.5 MG per tablet Take 1 tablet by mouth daily.    Historical Provider, MD  pantoprazole (PROTONIX) 40 MG tablet Take 1 tablet (40 mg total) by mouth 2 (two) times daily before a meal. 02/25/12   Eugenie Filler, MD  rOPINIRole (REQUIP) 0.5 MG tablet Take 1 tablet (0.5 mg total) by mouth at bedtime. 01/25/13   Susy Frizzle, MD  rosuvastatin (CRESTOR) 20 MG tablet Take 20 mg by mouth daily.    Historical Provider, MD  simvastatin (ZOCOR) 20 MG tablet Take 1 tablet (20 mg total) by mouth daily at 6 PM. 02/25/12   Eugenie Filler, MD  tolterodine (DETROL) 2 MG tablet Take 4 mg by mouth 2 (two) times daily.    Historical Provider, MD  triamcinolone cream (KENALOG) 0.1 % Apply 1 application topically 2 (two) times daily.    Historical Provider, MD  trimethoprim (TRIMPEX) 100 MG tablet Take 100 mg by mouth at bedtime.    Historical Provider, MD   BP 134/45 mmHg  Pulse 39  Temp(Src) 100.7 F (38.2 C) (Rectal)  Resp 24  Ht 5\' 4"  (1.626 m)  Wt 190 lb (86.183 kg)  BMI 32.60 kg/m2  SpO2 90% Physical Exam  Constitutional:  No obvious dyspnea.  HENT:  Head: Normocephalic and atraumatic.  Eyes: Conjunctivae and EOM are normal. Pupils are equal, round, and reactive to light.  Neck: Normal range of motion. Neck supple.  Cardiovascular: Normal rate and regular rhythm.   Pulmonary/Chest: Effort normal and breath sounds normal.  Abdominal: Soft. Bowel sounds are normal.  Musculoskeletal: Normal range of motion.  Neurological: She is alert.  Skin: Skin is warm and dry.  Psychiatric: Her behavior is normal.  Nursing note and vitals  reviewed.   ED Course  Procedures (including critical care time) Labs Review Labs Reviewed  COMPREHENSIVE METABOLIC PANEL - Abnormal; Notable for the following:    Chloride 100 (*)    Glucose, Bld 128 (*)    Total Bilirubin 1.4 (*)    GFR calc non Af Amer 53 (*)  All other components within normal limits  CBC WITH DIFFERENTIAL/PLATELET - Abnormal; Notable for the following:    WBC 11.0 (*)    RBC 3.23 (*)    Hemoglobin 9.8 (*)    HCT 30.4 (*)    Neutrophils Relative % 79 (*)    Neutro Abs 8.7 (*)    Lymphocytes Relative 11 (*)    All other components within normal limits  TROPONIN I - Abnormal; Notable for the following:    Troponin I 0.25 (*)    All other components within normal limits  CULTURE, BLOOD (ROUTINE X 2)  CULTURE, BLOOD (ROUTINE X 2)  URINE CULTURE  URINALYSIS, ROUTINE W REFLEX MICROSCOPIC (NOT AT [facility])  I-STAT CG4 LACTIC ACID, ED  I-STAT CG4 LACTIC ACID, ED    Imaging Review Dg Chest Port 1 View  10/27/2014   CLINICAL DATA:  Shortness of breath and lethargy  EXAM: PORTABLE CHEST - 1 VIEW  COMPARISON:  11/30/2012  FINDINGS: There is moderate cardiac enlargement. Aortic atherosclerosis identified. There is mild diffuse interstitial edema identified. Atelectasis noted in the right lung base.  IMPRESSION: 1. Cardiac enlargement and mild pulmonary edema. 2. Right base atelectasis. 3. Aortic atherosclerosis.   Electronically Signed   By: Kerby Moors M.D.   On: 10/27/2014 14:40     EKG Interpretation None      MDM   Final diagnoses:  Acute pulmonary edema   Chest x-ray suggests mild pulmonary edema. IV Lasix. Patient is hemodynamically stable. Admit to general medicine. Discussed with cardiologist Dr. Irish Lack.  He will consult with patient at [facility].    Nat Christen, MD 10/27/14 1540

## 2014-10-27 NOTE — ED Notes (Signed)
Cardizem at maximum titrration, 15 mg. HR 100-120, 144/83, 114

## 2014-10-27 NOTE — ED Notes (Signed)
Patient placed on bedpan, unable to urinate at this time, will let us know when she is able to

## 2014-10-27 NOTE — Progress Notes (Signed)
EDCM spoke to patient at bedside.  Patient reports she lives alone, "Part of the time."  Patient reports she has a friend who comes and stays with for a week or two from time to time.  Patient reports she does nto have home health services at this time and she never has.  Patient reports she has a walker, cane and shower bench at home.  Patient noted to be wearing oxygen in the ED, patient does not wear oxygen at home.  Patient reports her pcp is Dr. Delia Chimes.  Patient reports she thinks her friend would not be able to stay with her after she is discharged because, "she has a family."  EDCM offered support to patient.  Patient thankful for services.  No further EDCM needs at this time.

## 2014-10-27 NOTE — H&P (Addendum)
Patient Demographics  Amanda Hogan, is a 81 y.o. female  MRN: [id number]   DOB - February 06, 1929  Admit Date - 10/27/2014  Outpatient Primary MD for the patient is Delia Chimes, NP   With History of -  Past Medical History  Diagnosis Date  . Hypertension   . Arthritis   . High cholesterol   . Acid reflux   . TIA (transient ischemic attack)   . Restless leg syndrome   . Complication of anesthesia     "w/my back; gave me too much; thought I'd had stroke but didn't"  . Heart murmur   . Stroke     "said it looked like bullet holes; had a bunch"  . Angina   . Exertional dyspnea   . Sinus malignant neoplasm   . Fibromyalgia   . Memory disorder, possibly not taking home meds at times 07/22/2011  . Anxiety 02/23/2012  . Dementia 02/23/2012  . Kidney stone   . HTN (hypertension)   . Mild carotid artery disease 08/05/11    carotid dopplers, mild disease  . Chest pain     negative lexiscan myoview 53/13, last echo 02/24/12-EF 85-027, mid systolic obliteration of the LV, cavity aortic sclerosis      Past Surgical History  Procedure Laterality Date  . Cholecystectomy    . Vaginal hysterectomy    . Breast cyst excision      left  . Hernia repair      "stomach"  . Lung surgery      "made 3 holes and pulled gel out"  . Cardiac catheterization    . Knee cartilage surgery      right  . Lumbar spine surgery      "put box in my lower back"  . Elbow bursa surgery      left  . Cataract extraction w/ intraocular lens  implant, bilateral    . Eye surgery  1940    fixed my crossed eyes"  . Kidney stone surgery      "right side; cut it out"  . Carpal tunnel release      right    in for   Chief Complaint  Patient presents with  . Shortness of Breath  . Fatigue     HPI  Amanda Hogan  is a 81 y.o. female, with past medical history of hypertension, hyperlipidemia, TIA, recurrent UTIs, presents to her PCP with multiple complaints, including generalized body ache, and  shortness of breath, patient is very poor historian, patient with recent hospital admission at [facility] for altered mental status secondary to UTI,  NAD patient was hypoxic, chest x-ray significant for vascular congestion and pulmonary edema , and does not carry a diagnosis of CHF, most recent echo in 2013 with normal EF , patient's symptoms significantly improved after she received IV Lasix , workup was significant for elevated troponin at 0.25 , patient is very poor historian, she endorses chest pain as a part of generalized body pain and ache , was given 325 mg of aspirin, hospitalist requested to admit the patient for further evaluation   Addendum: Patient developed A. fib with RVR while in ED  Review of Systems    In addition to the HPI above, and  febrile 100.6 in ED  No changes with Vision or hearing, No problems swallowing food or Liquids  reports sore throat and shortness of Breath, reports chest pain as part of generalized body  No Abdominal pain, No Nausea or Vommitting, Bowel  movements are regular, No Blood in stool or Urine, No dysuria, No new skin rashes or bruises, No new joints pains-aches,  No new weakness, tingling, numbness in any extremity, No recent weight gain or loss, No polyuria, polydypsia or polyphagia, No significant Mental Stressors.  A full 10 point Review of Systems was done, except as stated above, all other Review of Systems were negative.   Social History History  Substance Use Topics  . Smoking status: Never Smoker   . Smokeless tobacco: Never Used  . Alcohol Use: No     Family History No family history on file.  significant for osteoarthritis .  Prior to Admission medications   Medication Sig Start Date End Date Taking? Authorizing Provider  acetaminophen (TYLENOL) 500 MG tablet Take 1,000 mg by mouth every 6 (six) hours as needed. For pain    Historical Provider, MD  ALPRAZolam Duanne Moron) 0.5 MG tablet Take 0.5 mg by mouth every 8 (eight)  hours as needed. For anxiety    Historical Provider, MD  Alum & Mag Hydroxide-Simeth (GI COCKTAIL) SUSP suspension Take 30 mLs by mouth 3 (three) times daily as needed for indigestion. Shake well. 02/25/12   Eugenie Filler, MD  amLODipine (NORVASC) 10 MG tablet Take 1 tablet (10 mg total) by mouth daily. 06/03/13   Susy Frizzle, MD  aspirin 81 MG tablet Take 1 tablet (81 mg total) by mouth daily. 02/25/12   Eugenie Filler, MD  citalopram (CELEXA) 20 MG tablet Take 1 tablet (20 mg total) by mouth daily. 02/25/12   Eugenie Filler, MD  clopidogrel (PLAVIX) 75 MG tablet Take 75 mg by mouth daily.    Historical Provider, MD  donepezil (ARICEPT) 5 MG tablet Take 5 mg by mouth at bedtime as needed.    Historical Provider, MD  HYDROcodone-acetaminophen (NORCO/VICODIN) 5-325 MG per tablet Take 1 tablet by mouth every 6 (six) hours as needed. As needed for pain.    Historical Provider, MD  HYDROcodone-ibuprofen (VICOPROFEN) 7.5-200 MG per tablet Take 1 tablet by mouth every 8 (eight) hours as needed. As needed for pain.    Historical Provider, MD  meloxicam (MOBIC) 7.5 MG tablet Take 7.5 mg by mouth 2 (two) times daily. Take one tablet twice a day with food for 2 weeks then as needed. Date filled was 02/22/12    Historical Provider, MD  memantine (NAMENDA) 10 MG tablet Take 10 mg by mouth 2 (two) times daily.    Historical Provider, MD  nitroGLYCERIN (NITROSTAT) 0.4 MG SL tablet Place 0.4 mg under the tongue every 5 (five) minutes as needed. For chest pain    Historical Provider, MD  olmesartan-hydrochlorothiazide (BENICAR HCT) 20-12.5 MG per tablet Take 1 tablet by mouth daily.    Historical Provider, MD  pantoprazole (PROTONIX) 40 MG tablet Take 1 tablet (40 mg total) by mouth 2 (two) times daily before a meal. 02/25/12   Eugenie Filler, MD  rOPINIRole (REQUIP) 0.5 MG tablet Take 1 tablet (0.5 mg total) by mouth at bedtime. 01/25/13   Susy Frizzle, MD  rosuvastatin (CRESTOR) 20 MG tablet Take 20  mg by mouth daily.    Historical Provider, MD  simvastatin (ZOCOR) 20 MG tablet Take 1 tablet (20 mg total) by mouth daily at 6 PM. 02/25/12   Eugenie Filler, MD  tolterodine (DETROL) 2 MG tablet Take 4 mg by mouth 2 (two) times daily.    Historical Provider, MD  triamcinolone cream (KENALOG) 0.1 % Apply 1 application  topically 2 (two) times daily.    Historical Provider, MD  trimethoprim (TRIMPEX) 100 MG tablet Take 100 mg by mouth at bedtime.    Historical Provider, MD    Allergies  Allergen Reactions  . Contrast Media [Iodinated Diagnostic Agents]   . Iohexol      Desc: ITCHING/RESPIRATORY DISTRESS     Physical Exam  Vitals  Blood pressure 133/108, pulse 72, temperature 100.7 F (38.2 C), temperature source Rectal, resp. rate 19, height 5\' 4"  (1.626 m), weight 86.183 kg (190 lb), SpO2 91 %.   1. Generl lying in bed in NAD,    2. Normal affect and insight, Not Suicidal or Homicidal, Awake Alert, Oriented X 3.  3. No F.N deficits, ALL C.Nerves Intact, Strength 5/5 all 4 extremities, Sensation intact all 4 extremities, Plantars down going.  4. Ears and Eyes appear Normal, Conjunctivae clear, PERRLA. Moist Oral Mucosa. could not appreciate any pharyngeal erythema .  5. Supple Neck, No JVD, No cervical lymphadenopathy appriciated, No Carotid Bruits.  6. Symmetrical Chest wall movement, Good air movement bilaterally, CTAB.  7. RRR, No Gallops, Rubs or Murmurs, No Parasternal Heave.  8. Positive Bowel Sounds, Abdomen Soft, No tenderness, No organomegaly appriciated,No rebound -guarding or rigidity.  9.  No Cyanosis, Normal Skin Turgor, No Skin Rash or Bruise.  10. Good muscle tone,  joints appear normal , no effusions, Normal ROM.  11. No Palpable Lymph Nodes in Neck or Axillae  Data Review  CBC  Recent Labs Lab 10/27/14 1221  WBC 11.0*  HGB 9.8*  HCT 30.4*  PLT 306  MCV 94.1  MCH 30.3  MCHC 32.2  RDW 14.1  LYMPHSABS 1.2  MONOABS 1.0  EOSABS 0.1  BASOSABS  0.0   ------------------------------------------------------------------------------------------------------------------  Chemistries   Recent Labs Lab 10/27/14 1223  NA 136  K 3.5  CL 100*  CO2 28  GLUCOSE 128*  BUN 20  CREATININE 0.96  CALCIUM 9.0  AST 20  ALT 22  ALKPHOS 92  BILITOT 1.4*   ------------------------------------------------------------------------------------------------------------------ estimated creatinine clearance is 47.2 mL/min (by C-G formula based on Cr of 0.96). ------------------------------------------------------------------------------------------------------------------ No results for input(s): TSH, T4TOTAL, T3FREE, THYROIDAB in the last 72 hours.  Invalid input(s): FREET3   Coagulation profile No results for input(s): INR, PROTIME in the last 168 hours. ------------------------------------------------------------------------------------------------------------------- No results for input(s): DDIMER in the last 72 hours. -------------------------------------------------------------------------------------------------------------------  Cardiac Enzymes  Recent Labs Lab 10/27/14 1221  TROPONINI 0.25*   ------------------------------------------------------------------------------------------------------------------ Invalid input(s): POCBNP   ---------------------------------------------------------------------------------------------------------------  Urinalysis    Component Value Date/Time   COLORURINE ORANGE* 10/27/2014 1451   COLORURINE Amber 11/30/2012 1020   APPEARANCEUR TURBID* 10/27/2014 1451   APPEARANCEUR Cloudy 11/30/2012 1020   LABSPEC 1.031* 10/27/2014 1451   LABSPEC 1.020 11/30/2012 1020   PHURINE 5.5 10/27/2014 1451   PHURINE 5.0 11/30/2012 1020   GLUCOSEU NEGATIVE 10/27/2014 1451   GLUCOSEU Negative 11/30/2012 1020   HGBUR NEGATIVE 10/27/2014 1451   HGBUR Negative 11/30/2012 1020   BILIRUBINUR SMALL*  10/27/2014 1451   BILIRUBINUR 1+ 11/30/2012 1020   KETONESUR NEGATIVE 10/27/2014 1451   KETONESUR Negative 11/30/2012 1020   PROTEINUR 100* 10/27/2014 1451   PROTEINUR 30 mg/dL 11/30/2012 1020   UROBILINOGEN 0.2 10/27/2014 1451   NITRITE POSITIVE* 10/27/2014 1451   NITRITE Negative 11/30/2012 1020   LEUKOCYTESUR MODERATE* 10/27/2014 1451   LEUKOCYTESUR 2+ 11/30/2012 1020    ----------------------------------------------------------------------------------------------------------------  Imaging results:   Dg Chest Port 1 View  10/27/2014   CLINICAL DATA:  Shortness of breath  and lethargy  EXAM: PORTABLE CHEST - 1 VIEW  COMPARISON:  11/30/2012  FINDINGS: There is moderate cardiac enlargement. Aortic atherosclerosis identified. There is mild diffuse interstitial edema identified. Atelectasis noted in the right lung base.  IMPRESSION: 1. Cardiac enlargement and mild pulmonary edema. 2. Right base atelectasis. 3. Aortic atherosclerosis.   Electronically Signed   By: Kerby Moors M.D.   On: 10/27/2014 14:40    My personal review of EKG: Rhythm NSR, Rate 72   /min, QTc  453, minimal flattening of T waves was compared to most recent EKG     Assessment & Plan  Principal Problem:   CHF (congestive heart failure) Active Problems:   Hypertension, at times poorly controlled   Hyperlipidemia   History of TIA (transient ischemic attack)   Dementia    Dyspnea/shortness of breath  - This is most likely related to acute congestive heart failure, no recent echo to indicate systolic versus diastolic  - Admit to telemetry , check 2-D echo , continue IV Lasix 40 mg IV every 12 hours, daily weights, strict ins and outs, cycle cardiac enzymes .  Positive troponin  - Patient is very poor historian, cannot specify if she has chest pain or not . - This is more likely in the setting of CHF, as well SIRS - to Be admitted to telemetry, will cycle cardiac enzymes and follow the trend , given  aspirin  325 mg oral in ED , already on Plavix , continue with statin , - Discussed with cardiology, they will see the patient in a.m.  SIRS - Patient has fever, tachycardia - We'll start on IV levofloxacin for acute pharyngitis, will obtain blood cultures, recently diagnosed with UTI, she'll be covered with levofloxacin,  History of TIA - Continue with Plavix and statin   history of dementia - Continue with Namenda  Hypertension - Continue with home medication  Hyperlipidemia - Continue with statin  Addendum: - Patient developed A. fib with RVR on telemetry monitor, she will be started on Cardizem drip, heparin drip, will change Plavix to aspirin 81 mg oral daily.    DVT Prophylaxis Heparin -    AM Labs Ordered, also please review Full Orders  Family Communication: Admission, patients condition and plan of care including tests being ordered have been discussed with the patient and son who indicate understanding and agree with the plan and Code Status.  Code Status DNR  Likely DC to  Home   Condition GUARDED    Time spent in minutes : 55 minutes    Numa Schroeter M.D on 10/27/2014 at 4:07 PM  Between 7am to 7pm - Pager - [phone]  After 7pm go to [email or web address] - password [email or web address]  And look for the night coverage person covering me after hours  [facility] Office  [phone]

## 2014-10-27 NOTE — ED Notes (Addendum)
Cardizem increased to 10,  HR 115-135, 135/74, 77

## 2014-10-27 NOTE — Progress Notes (Signed)
ANTICOAGULATION CONSULT NOTE - Initial Consult  Pharmacy Consult for IV Heparin Indication: Atrial Fibrillation  Allergies  Allergen Reactions  . Contrast Media [Iodinated Diagnostic Agents]   . Iohexol      Desc: ITCHING/RESPIRATORY DISTRESS     Patient Measurements: Height: 5\' 4"  (162.6 cm) Weight: 190 lb (86.183 kg) IBW/kg (Calculated) : 54.7 Heparin Dosing Weight:   Vital Signs: Temp: 100.7 F (38.2 C) (08/08 1237) Temp Source: Rectal (08/08 1237) BP: 133/108 mmHg (08/08 1500) Pulse Rate: 72 (08/08 1330)  Labs:  Recent Labs  10/27/14 1221 10/27/14 1223  HGB 9.8*  --   HCT 30.4*  --   PLT 306  --   CREATININE  --  0.96  TROPONINI 0.25*  --     Estimated Creatinine Clearance: 47.2 mL/min (by C-G formula based on Cr of 0.96).   Medical History: Past Medical History  Diagnosis Date  . Hypertension   . Arthritis   . High cholesterol   . Acid reflux   . TIA (transient ischemic attack)   . Restless leg syndrome   . Complication of anesthesia     "w/my back; gave me too much; thought I'd had stroke but didn't"  . Heart murmur   . Stroke     "said it looked like bullet holes; had a bunch"  . Angina   . Exertional dyspnea   . Sinus malignant neoplasm   . Fibromyalgia   . Memory disorder, possibly not taking home meds at times 07/22/2011  . Anxiety 02/23/2012  . Dementia 02/23/2012  . Kidney stone   . HTN (hypertension)   . Mild carotid artery disease 08/05/11    carotid dopplers, mild disease  . Chest pain     negative lexiscan myoview 53/13, last echo 02/24/12-EF 20-355, mid systolic obliteration of the LV, cavity aortic sclerosis   Assessment: 51 yoF with hx TIA presents with dyspnea and SOB felt to be related to acute CHF (no recent ECHO).  Pt also started on levaquin for acute bronchitis/SIRS.  Pt developed atrial fibrillation with RVR and pharmacy consulted to dose IV heparin.   --Medication history is not completed yet but appears pt is on plavix PTA  which MD is changing to ASA 81mg .    Heparin dosing weight = 74kg CBC: Hgb low, 9.8, plts WNL CrCl ~47 ml/min No baseline PT/INR, aPTT  Goal of Therapy:  Heparin level 0.3-0.7 units/ml Monitor platelets by anticoagulation protocol: Yes   Plan:  IV heparin bolus 3500 units x 1 IV heparin infusion 1000 units/hr F/u baseline labs, 8 hour heparin level F/u medication history   Ralene Bathe, PharmD, BCPS 10/27/2014, 5:12 PM  Pager: [phone]

## 2014-10-27 NOTE — ED Notes (Addendum)
Cardizem drip increased 12.5, HR 112-126, 135/72, 115

## 2014-10-27 NOTE — Progress Notes (Signed)
ANTIBIOTIC CONSULT NOTE - INITIAL  Pharmacy Consult for Levaquin Indication: Acute bronchitis  Allergies  Allergen Reactions  . Contrast Media [Iodinated Diagnostic Agents]   . Iohexol      Desc: ITCHING/RESPIRATORY DISTRESS     Patient Measurements: Height: 5\' 4"  (162.6 cm) Weight: 190 lb (86.183 kg) IBW/kg (Calculated) : 54.7 Adjusted Body Weight:   Vital Signs: Temp: 100.7 F (38.2 C) (08/08 1237) Temp Source: Rectal (08/08 1237) BP: 133/108 mmHg (08/08 1500) Pulse Rate: 72 (08/08 1330) Intake/Output from previous day:   Intake/Output from this shift:    Labs:  Recent Labs  10/27/14 1221 10/27/14 1223  WBC 11.0*  --   HGB 9.8*  --   PLT 306  --   CREATININE  --  0.96   Estimated Creatinine Clearance: 47.2 mL/min (by C-G formula based on Cr of 0.96). No results for input(s): VANCOTROUGH, VANCOPEAK, VANCORANDOM, GENTTROUGH, GENTPEAK, GENTRANDOM, TOBRATROUGH, TOBRAPEAK, TOBRARND, AMIKACINPEAK, AMIKACINTROU, AMIKACIN in the last 72 hours.   Microbiology: No results found for this or any previous visit (from the past 720 hour(s)).  Medical History: Past Medical History  Diagnosis Date  . Hypertension   . Arthritis   . High cholesterol   . Acid reflux   . TIA (transient ischemic attack)   . Restless leg syndrome   . Complication of anesthesia     "w/my back; gave me too much; thought I'd had stroke but didn't"  . Heart murmur   . Stroke     "said it looked like bullet holes; had a bunch"  . Angina   . Exertional dyspnea   . Sinus malignant neoplasm   . Fibromyalgia   . Memory disorder, possibly not taking home meds at times 07/22/2011  . Anxiety 02/23/2012  . Dementia 02/23/2012  . Kidney stone   . HTN (hypertension)   . Mild carotid artery disease 08/05/11    carotid dopplers, mild disease  . Chest pain     negative lexiscan myoview 53/13, last echo 02/24/12-EF 16-384, mid systolic obliteration of the LV, cavity aortic sclerosis   Assessment: 71  yoF recently admitted to OSH for AMS 2/2 UTI presents to [facility] on 8/8 with body ache, SOB, and difficulty walking.  Complicated PMHx including recurrent UTIs, dementia,anxiety, memory disorder, fibromylagia, HTN, HLD, and stroke. Chest x-ray suggests mild pulmonary edema.  Noted to be febrile in ED. Pharmacy consulted to start levaquin for acute bronchitis.   Anti-infectives 8/8 >> levaquin  >>  Vitals/Labs WBC: slightly elevated 11.0 Tm24h:100.7 SCr 0.96, CrCl ~47 ml/min (N 50) Lactate WNL.   Cultures 8/8 bloodx2: IP 8/8 urine: IP  Strep pneumo: Legionella: Sputum:  Goal of Therapy:  Eradication of infection  Plan:  Levaquin 500mg  IV q24h.  Watch renal function and adjust if worsens.    Ralene Bathe, PharmD, BCPS 10/27/2014, 4:28 PM  Pager: [phone]

## 2014-10-27 NOTE — ED Notes (Signed)
Patient is aware urine sample  is needed 

## 2014-10-27 NOTE — ED Notes (Signed)
Patient given coffee per request, will let us know when she is able to urinate

## 2014-10-28 ENCOUNTER — Inpatient Hospital Stay (HOSPITAL_COMMUNITY): Payer: PPO

## 2014-10-28 ENCOUNTER — Encounter (HOSPITAL_COMMUNITY): Payer: Self-pay | Admitting: Cardiology

## 2014-10-28 DIAGNOSIS — R7989 Other specified abnormal findings of blood chemistry: Secondary | ICD-10-CM

## 2014-10-28 DIAGNOSIS — R413 Other amnesia: Secondary | ICD-10-CM

## 2014-10-28 DIAGNOSIS — R778 Other specified abnormalities of plasma proteins: Secondary | ICD-10-CM | POA: Diagnosis present

## 2014-10-28 DIAGNOSIS — C8590 Non-Hodgkin lymphoma, unspecified, unspecified site: Secondary | ICD-10-CM | POA: Diagnosis present

## 2014-10-28 DIAGNOSIS — R0600 Dyspnea, unspecified: Secondary | ICD-10-CM

## 2014-10-28 DIAGNOSIS — I10 Essential (primary) hypertension: Secondary | ICD-10-CM

## 2014-10-28 LAB — BASIC METABOLIC PANEL
Anion gap: 11 (ref 5–15)
BUN: 22 mg/dL — AB (ref 6–20)
CO2: 26 mmol/L (ref 22–32)
Calcium: 8.4 mg/dL — ABNORMAL LOW (ref 8.9–10.3)
Chloride: 99 mmol/L — ABNORMAL LOW (ref 101–111)
Creatinine, Ser: 1.06 mg/dL — ABNORMAL HIGH (ref 0.44–1.00)
GFR calc Af Amer: 55 mL/min — ABNORMAL LOW (ref >60)
GFR calc non Af Amer: 47 mL/min — ABNORMAL LOW (ref >60)
Glucose, Bld: 108 mg/dL — ABNORMAL HIGH (ref 65–99)
Potassium: 3 mmol/L — ABNORMAL LOW (ref 3.5–5.1)
SODIUM: 136 mmol/L (ref 135–145)

## 2014-10-28 LAB — CBC
HCT: 30.6 % — ABNORMAL LOW (ref 36.0–46.0)
Hemoglobin: 10.1 g/dL — ABNORMAL LOW (ref 12.0–15.0)
MCH: 30.5 pg (ref 26.0–34.0)
MCHC: 33 g/dL (ref 30.0–36.0)
MCV: 92.4 fL (ref 78.0–100.0)
PLATELETS: 273 10*3/uL (ref 150–400)
RBC: 3.31 MIL/uL — ABNORMAL LOW (ref 3.87–5.11)
RDW: 13.8 % (ref 11.5–15.5)
WBC: 9.3 10*3/uL (ref 4.0–10.5)

## 2014-10-28 LAB — MRSA PCR SCREENING: MRSA by PCR: POSITIVE — AB

## 2014-10-28 LAB — HEPARIN LEVEL (UNFRACTIONATED)
HEPARIN UNFRACTIONATED: 0.12 [IU]/mL — AB (ref 0.30–0.70)
Heparin Unfractionated: 0.19 IU/mL — ABNORMAL LOW (ref 0.30–0.70)

## 2014-10-28 LAB — BRAIN NATRIURETIC PEPTIDE: B NATRIURETIC PEPTIDE 5: 715.8 pg/mL — AB (ref 0.0–100.0)

## 2014-10-28 LAB — TROPONIN I
TROPONIN I: 0.25 ng/mL — AB (ref <0.031)
TROPONIN I: 0.18 ng/mL — AB (ref <0.031)
Troponin I: 0.3 ng/mL — ABNORMAL HIGH (ref <0.031)

## 2014-10-28 LAB — MAGNESIUM: Magnesium: 1.8 mg/dL (ref 1.7–2.4)

## 2014-10-28 LAB — CREATININE, SERUM
CREATININE: 1.07 mg/dL — AB (ref 0.44–1.00)
GFR calc Af Amer: 54 mL/min — ABNORMAL LOW (ref >60)
GFR calc non Af Amer: 47 mL/min — ABNORMAL LOW (ref >60)

## 2014-10-28 LAB — TSH: TSH: 4.336 u[IU]/mL (ref 0.350–4.500)

## 2014-10-28 MED ORDER — DILTIAZEM HCL ER COATED BEADS 120 MG PO CP24
120.0000 mg | ORAL_CAPSULE | Freq: Every day | ORAL | Status: DC
  Administered 2014-10-28 – 2014-11-01 (×5): 120 mg via ORAL
  Filled 2014-10-28 (×5): qty 1

## 2014-10-28 MED ORDER — CITALOPRAM HYDROBROMIDE 20 MG PO TABS
20.0000 mg | ORAL_TABLET | Freq: Every day | ORAL | Status: DC
  Administered 2014-10-28 – 2014-11-01 (×5): 20 mg via ORAL
  Filled 2014-10-28 (×5): qty 1

## 2014-10-28 MED ORDER — AMLODIPINE BESYLATE 10 MG PO TABS
10.0000 mg | ORAL_TABLET | Freq: Every day | ORAL | Status: DC
  Administered 2014-10-28: 10 mg via ORAL
  Filled 2014-10-28: qty 1

## 2014-10-28 MED ORDER — ATORVASTATIN CALCIUM 10 MG PO TABS
10.0000 mg | ORAL_TABLET | Freq: Every day | ORAL | Status: DC
  Administered 2014-10-28 – 2014-10-31 (×4): 10 mg via ORAL
  Filled 2014-10-28 (×4): qty 1

## 2014-10-28 MED ORDER — TRIMETHOPRIM 100 MG PO TABS
100.0000 mg | ORAL_TABLET | Freq: Every day | ORAL | Status: DC
  Administered 2014-10-28 – 2014-10-31 (×5): 100 mg via ORAL
  Filled 2014-10-28 (×7): qty 1

## 2014-10-28 MED ORDER — PANTOPRAZOLE SODIUM 40 MG PO TBEC
40.0000 mg | DELAYED_RELEASE_TABLET | Freq: Two times a day (BID) | ORAL | Status: DC
  Administered 2014-10-28 – 2014-11-01 (×9): 40 mg via ORAL
  Filled 2014-10-28 (×10): qty 1

## 2014-10-28 MED ORDER — NITROGLYCERIN 0.4 MG SL SUBL: 0.4000 mg | SUBLINGUAL_TABLET | SUBLINGUAL | Status: DC | PRN

## 2014-10-28 MED ORDER — ACETAMINOPHEN 325 MG PO TABS
650.0000 mg | ORAL_TABLET | ORAL | Status: DC | PRN
  Administered 2014-10-31: 650 mg via ORAL
  Filled 2014-10-28: qty 2

## 2014-10-28 MED ORDER — SODIUM CHLORIDE 0.9 % IV SOLN: 250.0000 mL | INTRAVENOUS | Status: DC | PRN

## 2014-10-28 MED ORDER — HEPARIN (PORCINE) IN NACL 100-0.45 UNIT/ML-% IJ SOLN
1200.0000 [IU]/h | INTRAMUSCULAR | Status: DC
  Filled 2014-10-28: qty 250

## 2014-10-28 MED ORDER — SODIUM CHLORIDE 0.9 % IJ SOLN
3.0000 mL | INTRAMUSCULAR | Status: DC | PRN
  Administered 2014-10-30: 3 mL via INTRAVENOUS
  Filled 2014-10-28: qty 3

## 2014-10-28 MED ORDER — SIMVASTATIN 10 MG PO TABS
20.0000 mg | ORAL_TABLET | Freq: Every day | ORAL | Status: DC
  Filled 2014-10-28: qty 1

## 2014-10-28 MED ORDER — OLMESARTAN MEDOXOMIL-HCTZ 20-12.5 MG PO TABS: 1.0000 | ORAL_TABLET | Freq: Every day | ORAL | Status: DC

## 2014-10-28 MED ORDER — FUROSEMIDE 10 MG/ML IJ SOLN
40.0000 mg | Freq: Two times a day (BID) | INTRAMUSCULAR | Status: DC
  Administered 2014-10-28 – 2014-10-30 (×5): 40 mg via INTRAVENOUS
  Filled 2014-10-28 (×5): qty 4

## 2014-10-28 MED ORDER — IRBESARTAN 150 MG PO TABS
150.0000 mg | ORAL_TABLET | Freq: Every day | ORAL | Status: DC
  Administered 2014-10-28 – 2014-10-30 (×3): 150 mg via ORAL
  Filled 2014-10-28 (×4): qty 1

## 2014-10-28 MED ORDER — ALPRAZOLAM 0.5 MG PO TABS
0.5000 mg | ORAL_TABLET | Freq: Three times a day (TID) | ORAL | Status: DC | PRN
  Administered 2014-10-29 (×2): 0.5 mg via ORAL
  Filled 2014-10-28 (×2): qty 1

## 2014-10-28 MED ORDER — MEMANTINE HCL 5 MG PO TABS
10.0000 mg | ORAL_TABLET | Freq: Two times a day (BID) | ORAL | Status: DC
  Administered 2014-10-28 – 2014-10-29 (×4): 10 mg via ORAL
  Filled 2014-10-28: qty 1
  Filled 2014-10-28: qty 2
  Filled 2014-10-28 (×2): qty 1
  Filled 2014-10-28: qty 2

## 2014-10-28 MED ORDER — HYDROCHLOROTHIAZIDE 12.5 MG PO CAPS
12.5000 mg | ORAL_CAPSULE | Freq: Every day | ORAL | Status: DC
  Administered 2014-10-28: 12.5 mg via ORAL
  Filled 2014-10-28: qty 1

## 2014-10-28 MED ORDER — ASPIRIN EC 81 MG PO TBEC
81.0000 mg | DELAYED_RELEASE_TABLET | Freq: Every day | ORAL | Status: DC
  Administered 2014-10-28 – 2014-11-01 (×5): 81 mg via ORAL
  Filled 2014-10-28 (×5): qty 1

## 2014-10-28 MED ORDER — SODIUM CHLORIDE 0.9 % IJ SOLN
3.0000 mL | Freq: Two times a day (BID) | INTRAMUSCULAR | Status: DC
  Administered 2014-10-28 – 2014-10-31 (×6): 3 mL via INTRAVENOUS

## 2014-10-28 MED ORDER — POTASSIUM CHLORIDE CRYS ER 20 MEQ PO TBCR
20.0000 meq | EXTENDED_RELEASE_TABLET | Freq: Two times a day (BID) | ORAL | Status: DC
  Administered 2014-10-28: 20 meq via ORAL
  Filled 2014-10-28: qty 1

## 2014-10-28 MED ORDER — ONDANSETRON HCL 4 MG/2ML IJ SOLN: 4.0000 mg | Freq: Four times a day (QID) | INTRAMUSCULAR | Status: DC | PRN

## 2014-10-28 MED ORDER — HEPARIN (PORCINE) IN NACL 100-0.45 UNIT/ML-% IJ SOLN
1500.0000 [IU]/h | INTRAMUSCULAR | Status: DC
  Administered 2014-10-28 – 2014-10-29 (×2): 1400 [IU]/h via INTRAVENOUS
  Filled 2014-10-28 (×2): qty 250

## 2014-10-28 MED ORDER — POTASSIUM CHLORIDE CRYS ER 20 MEQ PO TBCR
40.0000 meq | EXTENDED_RELEASE_TABLET | Freq: Once | ORAL | Status: AC
  Administered 2014-10-28: 40 meq via ORAL
  Filled 2014-10-28: qty 2

## 2014-10-28 MED ORDER — OXYBUTYNIN CHLORIDE ER 5 MG PO TB24
5.0000 mg | ORAL_TABLET | Freq: Every day | ORAL | Status: DC
  Administered 2014-10-28 – 2014-10-31 (×5): 5 mg via ORAL
  Filled 2014-10-28 (×7): qty 1

## 2014-10-28 MED ORDER — LEVOFLOXACIN IN D5W 250 MG/50ML IV SOLN
250.0000 mg | INTRAVENOUS | Status: DC
  Administered 2014-10-29 – 2014-10-30 (×2): 250 mg via INTRAVENOUS
  Filled 2014-10-28 (×3): qty 50

## 2014-10-28 NOTE — Progress Notes (Signed)
ANTICOAGULATION CONSULT NOTE - Follow Up Consult  Pharmacy Consult for Heparin Indication: atrial fibrillation  Allergies  Allergen Reactions  . Contrast Media [Iodinated Diagnostic Agents]   . Iohexol      Desc: ITCHING/RESPIRATORY DISTRESS     Patient Measurements: Height: 5\' 4"  (162.6 cm) Weight: 193 lb 12.6 oz (87.9 kg) IBW/kg (Calculated) : 54.7 Heparin Dosing Weight: 74 kg  Vital Signs: Temp: 98.3 F (36.8 C) (08/09 1200) Temp Source: Oral (08/09 1200) BP: 152/59 mmHg (08/09 1500) Pulse Rate: 73 (08/09 1500)  Labs:  Recent Labs  10/27/14 1221 10/27/14 1223 10/28/14 0208 10/28/14 0440 10/28/14 0756 10/28/14 1410  HGB 9.8*  --  10.1*  --   --   --   HCT 30.4*  --  30.6*  --   --   --   PLT 306  --  273  --   --   --   APTT  --  37  --   --   --   --   LABPROT  --  15.0  --   --   --   --   INR  --  1.17  --   --   --   --   HEPARINUNFRC  --   --   --  0.12*  --  0.19*  CREATININE  --  0.96 1.07* 1.06*  --   --   TROPONINI 0.25*  --  0.30*  --  0.25* 0.18*    Estimated Creatinine Clearance: 43.2 mL/min (by C-G formula based on Cr of 1.06).   Medications:  Infusions:  . heparin      Assessment: 56 yoF with hx TIA presents with dyspnea and SOB felt to be related to acute CHF (no recent ECHO). Pt also started on levaquin for acute bronchitis/SIRS. Pt developed atrial fibrillation with RVR and pharmacy consulted to dose IV heparin.  Heparin level = 0.19 on heparin @ 1200 units/hr  No complications of therapy noted  Goal of Therapy:  Heparin level 0.3-0.7 units/ml Monitor platelets by anticoagulation protocol: Yes   Plan:  Increase heparin to 1400 units/hr Recheck heparin level 8 hr after heparin rate increased  Janiece Scovill, Toribio Harbour, PharmD 10/28/2014,3:15 PM

## 2014-10-28 NOTE — ED Notes (Signed)
Pt adjusted in bed with rolled sheet added to small of her back for comfort per her request.

## 2014-10-28 NOTE — ED Notes (Signed)
ED phlebotomy at bedside to collect labs.

## 2014-10-28 NOTE — ED Notes (Signed)
Pt moved to hospital bed for comfort.  Voided x 1 (bedpan).  Positioned on left side with pillow.

## 2014-10-28 NOTE — Consult Note (Signed)
Reason for Consult:   AF with RVR, acute CHF  Requesting Physician: [facility] Primary Cardiologist Dr Ellyn Hack   HPI: [name] with a past medical history significant for memory disorder and dementia. She has had problems in the past with chest pain felt to be related to medication non compliance. She saw Dr Rollene Fare in May 2013 (see his office note in MEDIA). He did not think her chest pain was cardiac. The next day she was admitted to [facility] with chest pain. Myoview May 2013 was negative for ischemia. Her echo showed an EF of 70% with mild AS and LVH. We saw her again in Dec 2013 for the same scenario confusion, uncontrolled HTN. I could find no information documenting prior cath, MI, or AF.   She had recently been admitted to the hospital in [facility] with confusion and a UTI. She presented to the ED at [facility] 10/27/14 with SOB and was found to be in CHF with rapid AF, as well as fever. She has been started on IV antibiotics, IV Lasix, and Diltiazem. She was on Plavix (no ASA) prior to admission.   PMHx:  Past Medical History  Diagnosis Date  . Hypertension   . Arthritis   . High cholesterol   . Acid reflux   . TIA (transient ischemic attack)   . Restless leg syndrome   . Complication of anesthesia     "w/my back; gave me too much; thought I'd had stroke but didn't"  . Heart murmur   . Stroke     "said it looked like bullet holes; had a bunch"  . Angina   . Exertional dyspnea   . Sinus malignant neoplasm   . Fibromyalgia   . Memory disorder, possibly not taking home meds at times 07/22/2011  . Anxiety 02/23/2012  . Dementia 02/23/2012  . Kidney stone   . HTN (hypertension)   . Mild carotid artery disease 08/05/11    carotid dopplers, mild disease  . Chest pain     negative lexiscan myoview 53/13, last echo 02/24/12-EF 83-662, mid systolic obliteration of the LV, cavity aortic sclerosis    Past Surgical History  Procedure Laterality Date  .  Cholecystectomy  2005  . Vaginal hysterectomy    . Breast cyst excision      left  . Hernia repair  2007    "stomach"  . Lung surgery      "made 3 holes and pulled gel out"  . Knee cartilage surgery      right  . Lumbar spine surgery      "put box in my lower back"  . Elbow bursa surgery      left  . Cataract extraction w/ intraocular lens  implant, bilateral    . Eye surgery  1940    fixed my crossed eyes"  . Kidney stone surgery      "right side; cut it out"  . Carpal tunnel release      right    SOCHx:  reports that she has never smoked. She has never used smokeless tobacco. She reports that she does not drink alcohol or use illicit drugs.Her husband died 19-Feb-2013. She says she lives alone with help from her family.   FAMHx: No history of early CAD  ALLERGIES: Allergies  Allergen Reactions  . Contrast Media [Iodinated Diagnostic Agents]   . Iohexol      Desc: ITCHING/RESPIRATORY DISTRESS  ROS: Pertinent items are noted in HPI. See H&P for complete details  HOME MEDICATIONS: Prior to Admission medications   Medication Sig Start Date End Date Taking? Authorizing Provider  acetaminophen (TYLENOL) 500 MG tablet Take 1,000 mg by mouth every 6 (six) hours as needed. For pain   Yes Historical Provider, MD  ALPRAZolam (XANAX) 0.25 MG tablet Take 0.25 mg by mouth daily as needed for anxiety.   Yes Historical Provider, MD  amLODipine (NORVASC) 10 MG tablet Take 1 tablet (10 mg total) by mouth daily. 06/03/13  Yes Susy Frizzle, MD  aspirin 81 MG tablet Take 1 tablet (81 mg total) by mouth daily. 02/25/12  Yes Eugenie Filler, MD  celecoxib (CELEBREX) 200 MG capsule Take 200 mg by mouth daily.   Yes Historical Provider, MD  citalopram (CELEXA) 20 MG tablet Take 1 tablet (20 mg total) by mouth daily. 02/25/12  Yes Eugenie Filler, MD  gabapentin (NEURONTIN) 300 MG capsule Take 300 mg by mouth at bedtime.   Yes Historical Provider, MD  memantine (NAMENDA XR) 28 MG CP24  24 hr capsule Take 28 mg by mouth daily.   Yes Historical Provider, MD  metoprolol succinate (TOPROL-XL) 25 MG 24 hr tablet Take 25 mg by mouth daily.   Yes Historical Provider, MD  nitroGLYCERIN (NITROSTAT) 0.4 MG SL tablet Place 0.4 mg under the tongue every 5 (five) minutes as needed. For chest pain   Yes Historical Provider, MD  pantoprazole (PROTONIX) 40 MG tablet Take 1 tablet (40 mg total) by mouth 2 (two) times daily before a meal. 02/25/12  Yes Eugenie Filler, MD  Probiotic Product (PROBIOTIC PO) Take 1 tablet by mouth daily with lunch.   Yes Historical Provider, MD  rOPINIRole (REQUIP) 2 MG tablet Take 2 mg by mouth 3 (three) times daily as needed (restless legs).   Yes Historical Provider, MD  spironolactone (ALDACTONE) 25 MG tablet Take 25 mg by mouth daily.   Yes Historical Provider, MD  clopidogrel (PLAVIX) 75 MG tablet Take 75 mg by mouth daily.    Historical Provider, MD  rOPINIRole (REQUIP) 0.5 MG tablet Take 1 tablet (0.5 mg total) by mouth at bedtime. Patient not taking: Reported on 10/27/2014 01/25/13   Susy Frizzle, MD  rosuvastatin (CRESTOR) 20 MG tablet Take 20 mg by mouth daily.    Historical Provider, MD  tolterodine (DETROL) 2 MG tablet Take 4 mg by mouth 2 (two) times daily.    Historical Provider, MD  triamcinolone cream (KENALOG) 0.1 % Apply 1 application topically 2 (two) times daily.    Historical Provider, MD  trimethoprim (TRIMPEX) 100 MG tablet Take 100 mg by mouth at bedtime.    Historical Provider, MD    HOSPITAL MEDICATIONS: I have reviewed the patient's current medications.  VITALS: Blood pressure 132/69, pulse 82, temperature 97.9 F (36.6 C), temperature source Oral, resp. rate 27, height 5\' 4"  (1.626 m), weight 190 lb (86.183 kg), SpO2 90 %.  PHYSICAL EXAM: General appearance: alert, cooperative, no distress, moderately obese and on face mask O2 Neck: no carotid bruit Lungs: decreased breath sounds Heart: irregularly irregular rhythm Abdomen:  obese, not distended Extremities: no edema Pulses: diminnished Skin: cool, pale, dry Neurologic: Grossly normal, she told me the date was "August 26th 2016".   LABS: Results for orders placed or performed during the hospital encounter of 10/27/14 (from the past 24 hour(s))  CBC with Differential     Status: Abnormal   Collection Time: 10/27/14 12:21 PM  Result Value Ref Range   WBC 11.0 (H) 4.0 - 10.5 K/uL   RBC 3.23 (L) 3.87 - 5.11 MIL/uL   Hemoglobin 9.8 (L) 12.0 - 15.0 g/dL   HCT 30.4 (L) 36.0 - 46.0 %   MCV 94.1 78.0 - 100.0 fL   MCH 30.3 26.0 - 34.0 pg   MCHC 32.2 30.0 - 36.0 g/dL   RDW 14.1 11.5 - 15.5 %   Platelets 306 150 - 400 K/uL   Neutrophils Relative % 79 (H) 43 - 77 %   Neutro Abs 8.7 (H) 1.7 - 7.7 K/uL   Lymphocytes Relative 11 (L) 12 - 46 %   Lymphs Abs 1.2 0.7 - 4.0 K/uL   Monocytes Relative 9 3 - 12 %   Monocytes Absolute 1.0 0.1 - 1.0 K/uL   Eosinophils Relative 1 0 - 5 %   Eosinophils Absolute 0.1 0.0 - 0.7 K/uL   Basophils Relative 0 0 - 1 %   Basophils Absolute 0.0 0.0 - 0.1 K/uL  Troponin I     Status: Abnormal   Collection Time: 10/27/14 12:21 PM  Result Value Ref Range   Troponin I 0.25 (H) <0.031 ng/mL  Comprehensive metabolic panel     Status: Abnormal   Collection Time: 10/27/14 12:23 PM  Result Value Ref Range   Sodium 136 135 - 145 mmol/L   Potassium 3.5 3.5 - 5.1 mmol/L   Chloride 100 (L) 101 - 111 mmol/L   CO2 28 22 - 32 mmol/L   Glucose, Bld 128 (H) 65 - 99 mg/dL   BUN 20 6 - 20 mg/dL   Creatinine, Ser 0.96 0.44 - 1.00 mg/dL   Calcium 9.0 8.9 - 10.3 mg/dL   Total Protein 7.9 6.5 - 8.1 g/dL   Albumin 3.5 3.5 - 5.0 g/dL   AST 20 15 - 41 U/L   ALT 22 14 - 54 U/L   Alkaline Phosphatase 92 38 - 126 U/L   Total Bilirubin 1.4 (H) 0.3 - 1.2 mg/dL   GFR calc non Af Amer 53 (L) >60 mL/min   GFR calc Af Amer >60 >60 mL/min   Anion gap 8 5 - 15  Protime-INR     Status: None   Collection Time: 10/27/14 12:23 PM  Result Value Ref Range    Prothrombin Time 15.0 11.6 - 15.2 seconds   INR 1.17 0.00 - 1.49  APTT     Status: None   Collection Time: 10/27/14 12:23 PM  Result Value Ref Range   aPTT 37 24 - 37 seconds  Urinalysis, Routine w reflex microscopic (not at [facility])     Status: Abnormal   Collection Time: 10/27/14  2:51 PM  Result Value Ref Range   Color, Urine ORANGE (A) YELLOW   APPearance TURBID (A) CLEAR   Specific Gravity, Urine 1.031 (H) 1.005 - 1.030   pH 5.5 5.0 - 8.0   Glucose, UA NEGATIVE NEGATIVE mg/dL   Hgb urine dipstick NEGATIVE NEGATIVE   Bilirubin Urine SMALL (A) NEGATIVE   Ketones, ur NEGATIVE NEGATIVE mg/dL   Protein, ur 100 (A) NEGATIVE mg/dL   Urobilinogen, UA 0.2 0.0 - 1.0 mg/dL   Nitrite POSITIVE (A) NEGATIVE   Leukocytes, UA MODERATE (A) NEGATIVE  Urine microscopic-add on     Status: Abnormal   Collection Time: 10/27/14  2:51 PM  Result Value Ref Range   Squamous Epithelial / LPF MANY (A) RARE   WBC, UA 7-10 <3 WBC/hpf   Bacteria, UA MANY (A) RARE  Urine-Other MUCOUS PRESENT   I-Stat CG4 Lactic Acid, ED  (not at [facility])     Status: None   Collection Time: 10/27/14  3:09 PM  Result Value Ref Range   Lactic Acid, Venous 1.37 0.5 - 2.0 mmol/L  CBC     Status: Abnormal   Collection Time: 10/28/14  2:08 AM  Result Value Ref Range   WBC 9.3 4.0 - 10.5 K/uL   RBC 3.31 (L) 3.87 - 5.11 MIL/uL   Hemoglobin 10.1 (L) 12.0 - 15.0 g/dL   HCT 30.6 (L) 36.0 - 46.0 %   MCV 92.4 78.0 - 100.0 fL   MCH 30.5 26.0 - 34.0 pg   MCHC 33.0 30.0 - 36.0 g/dL   RDW 13.8 11.5 - 15.5 %   Platelets 273 150 - 400 K/uL  Creatinine, serum     Status: Abnormal   Collection Time: 10/28/14  2:08 AM  Result Value Ref Range   Creatinine, Ser 1.07 (H) 0.44 - 1.00 mg/dL   GFR calc non Af Amer 47 (L) >60 mL/min   GFR calc Af Amer 54 (L) >60 mL/min  Troponin I (q 6hr x 3)     Status: Abnormal   Collection Time: 10/28/14  2:08 AM  Result Value Ref Range   Troponin I 0.30 (H) <0.031 ng/mL  Brain natriuretic peptide      Status: Abnormal   Collection Time: 10/28/14  4:40 AM  Result Value Ref Range   B Natriuretic Peptide 715.8 (H) 0.0 - 100.0 pg/mL  Basic metabolic panel     Status: Abnormal   Collection Time: 10/28/14  4:40 AM  Result Value Ref Range   Sodium 136 135 - 145 mmol/L   Potassium 3.0 (L) 3.5 - 5.1 mmol/L   Chloride 99 (L) 101 - 111 mmol/L   CO2 26 22 - 32 mmol/L   Glucose, Bld 108 (H) 65 - 99 mg/dL   BUN 22 (H) 6 - 20 mg/dL   Creatinine, Ser 1.06 (H) 0.44 - 1.00 mg/dL   Calcium 8.4 (L) 8.9 - 10.3 mg/dL   GFR calc non Af Amer 47 (L) >60 mL/min   GFR calc Af Amer 55 (L) >60 mL/min   Anion gap 11 5 - 15  Heparin level (unfractionated)     Status: Abnormal   Collection Time: 10/28/14  4:40 AM  Result Value Ref Range   Heparin Unfractionated 0.12 (L) 0.30 - 0.70 IU/mL  Troponin I (q 6hr x 3)     Status: Abnormal   Collection Time: 10/28/14  7:56 AM  Result Value Ref Range   Troponin I 0.25 (H) <0.031 ng/mL    EKG: AF with RVR, LVH  IMAGING: Dg Chest Port 1 View  10/27/2014   CLINICAL DATA:  Shortness of breath and lethargy  EXAM: PORTABLE CHEST - 1 VIEW  COMPARISON:  11/30/2012  FINDINGS: There is moderate cardiac enlargement. Aortic atherosclerosis identified. There is mild diffuse interstitial edema identified. Atelectasis noted in the right lung base.  IMPRESSION: 1. Cardiac enlargement and mild pulmonary edema. 2. Right base atelectasis. 3. Aortic atherosclerosis.   Electronically Signed   By: Kerby Moors M.D.   On: 10/27/2014 14:40    IMPRESSION: Principal Problem:   Acute CHF Active Problems:   Atrial fibrillation with RVR (new)   Hypertension, at times poorly controlled   Memory disorder, possibly not taking home meds at times   Troponin level elevated   Chest pain-low risk Myoview May 2013   Hyperlipidemia  History of TIA (transient ischemic attack)   Chronic back pain   Anxiety   Dementia   HCVD (hypertensive cardiovascular disease)-LVH, EF 70% 2013   Lymphoma  history   RECOMMENDATION: Hold HCTZ and Norvasc while on IV Diltiazem and Lasix. Resume home beta blocker but change to lopressor 12.5 mg BID. Echo when rate controlled. Check TSH. Keep K+ close to 4.0, (3.0 today). Heparin started.  MD to see. CHADs VASc = 7 for age, sex, CHF, HTN, prior CVA by CT.   Time Spent Directly with Patient: 45 minutes  Erlene Quan [phone] beeper 10/28/2014, 11:31 AM    Agree with note written by Kerin Ransom [facility]  Admitted with CHF/ Afib with RVR and UTI. H/O mild AS by 2D 2013. Had UTI in [facility] in [place] Thurs/Fri last week. BNP 700. On IV hep and Dilt. Pt converted to NSR. Exam benign. On O2 by FM. Diuresing. On ATBX for UTI. Put back on home BB. Convert IV --->>> PO CCB. CHA2DSVASC2 score =7 but high fall risk and mild dementia (forgets to take meds per her children). Prob not a NOAC candidate.  Will re check 2D. She has an AS murmur.  Quay Burow 10/28/2014 2:02 PM

## 2014-10-28 NOTE — ED Notes (Signed)
Pt on venti mask.  Assisted with breakfast. Only able to tolerate 3-4 bites of eggs/grits and a few sips of coffee and ginger ale.  I let the patient know we could try to eat again shortly and I would reheat her breakfast if necessary.

## 2014-10-28 NOTE — Progress Notes (Signed)
[facility] PROGRESS NOTE  Filed Weights   10/27/14 1156  Weight: 86.183 kg (190 lb)        Intake/Output Summary (Last 24 hours) at 10/28/14 1158 Last data filed at 10/28/14 0827  Gross per 24 hour  Intake    100 ml  Output    800 ml  Net   -700 ml     Assessment/Plan: Acute diastolic heart failure: - Started on IV lasix with sluggish UOP. Echo is pending. - Admit to telemetry, consulted cardiology. - strict I and O's.  Troponin level elevated: - likely demand ischemia - chest pain free was on RVR now rate controlled. - troponin's improving.  Afib with RVR:  - on diltiazem drip and heparin cardiology consulted. - now rate controlled further manege ment per cardiology.  SIRS: - with fever and RVR on admission, started empirically on levofloxacin, cultures sent. - afebrile, rate controlled.   History of TIA - Continue with Plavix and statin  history of dementia - Continue with Namenda  Hypertension - Continue with home medication  Hyperlipidemia - Continue with statin    Code Status: DNR Family Communication: none  Disposition Plan: inpatient   Consultants:  cardiology  Procedures: ECHO: pending  Antibiotics:  levaquin  HPI/Subjective: She relates her SOB is improved. No chest pain  Objective: Filed Vitals:   10/28/14 1000 10/28/14 1055 10/28/14 1130 10/28/14 1149  BP: 127/59 132/69 123/73 123/73  Pulse: 82  80 72  Temp:      TempSrc:      Resp: 27  24   Height:      Weight:      SpO2: 90%  92% 91%     Exam:  General: Alert, awake, oriented x3, in no acute distress.  HEENT: No bruits, no goiter.  Heart: IRegular rate and rhythm. Lungs: Good air movement, clear  Abdomen: Soft, nontender, nondistended, positive bowel sounds.  Neuro: Grossly intact, nonfocal.   Data Reviewed: Basic Metabolic Panel:  Recent Labs Lab 10/27/14 1223 10/28/14 0208 10/28/14 0440  NA 136  --  136  K 3.5  --  3.0*  CL 100*  --  99*   CO2 28  --  26  GLUCOSE 128*  --  108*  BUN 20  --  22*  CREATININE 0.96 1.07* 1.06*  CALCIUM 9.0  --  8.4*   Liver Function Tests:  Recent Labs Lab 10/27/14 1223  AST 20  ALT 22  ALKPHOS 92  BILITOT 1.4*  PROT 7.9  ALBUMIN 3.5   No results for input(s): LIPASE, AMYLASE in the last 168 hours. No results for input(s): AMMONIA in the last 168 hours. CBC:  Recent Labs Lab 10/27/14 1221 10/28/14 0208  WBC 11.0* 9.3  NEUTROABS 8.7*  --   HGB 9.8* 10.1*  HCT 30.4* 30.6*  MCV 94.1 92.4  PLT 306 273   Cardiac Enzymes:  Recent Labs Lab 10/27/14 1221 10/28/14 0208 10/28/14 0756  TROPONINI 0.25* 0.30* 0.25*   BNP (last 3 results)  Recent Labs  10/28/14 0440  BNP 715.8*    ProBNP (last 3 results) No results for input(s): PROBNP in the last 8760 hours.  CBG: No results for input(s): GLUCAP in the last 168 hours.  No results found for this or any previous visit (from the past 240 hour(s)).   Studies: Dg Chest Port 1 View  10/27/2014   CLINICAL DATA:  Shortness of breath and lethargy  EXAM: PORTABLE CHEST - 1 VIEW  COMPARISON:  11/30/2012  FINDINGS: There is moderate cardiac enlargement. Aortic atherosclerosis identified. There is mild diffuse interstitial edema identified. Atelectasis noted in the right lung base.  IMPRESSION: 1. Cardiac enlargement and mild pulmonary edema. 2. Right base atelectasis. 3. Aortic atherosclerosis.   Electronically Signed   By: Kerby Moors M.D.   On: 10/27/2014 14:40    Scheduled Meds: . aspirin EC  81 mg Oral Daily  . citalopram  20 mg Oral Daily  . furosemide  40 mg Intravenous Q12H  . irbesartan  150 mg Oral Daily  . memantine  10 mg Oral BID  . oxybutynin  5 mg Oral QHS  . pantoprazole  40 mg Oral BID AC  . potassium chloride  20 mEq Oral BID  . potassium chloride  40 mEq Oral Once  . simvastatin  20 mg Oral q1800  . sodium chloride  3 mL Intravenous Q12H  . trimethoprim  100 mg Oral QHS   Continuous Infusions: .  sodium chloride    . diltiazem (CARDIZEM) infusion 15 mg/hr (10/28/14 0211)  . heparin 1,200 Units/hr (10/28/14 0538)  . levofloxacin (LEVAQUIN) IV Stopped (10/28/14 0981)     Charlynne Cousins  [facility] Pager [phone]  If 7PM-7AM, please contact night-coverage at [email or web address], password [facility] 10/28/2014, 11:58 AM  LOS: 1 day

## 2014-10-28 NOTE — Progress Notes (Signed)
ANTICOAGULATION CONSULT NOTE - Follow Up Consult  Pharmacy Consult for Heparin Indication: atrial fibrillation  Allergies  Allergen Reactions  . Contrast Media [Iodinated Diagnostic Agents]   . Iohexol      Desc: ITCHING/RESPIRATORY DISTRESS     Patient Measurements: Height: 5\' 4"  (162.6 cm) Weight: 190 lb (86.183 kg) IBW/kg (Calculated) : 54.7 Heparin Dosing Weight:   Vital Signs: BP: 150/67 mmHg (08/09 0330) Pulse Rate: 91 (08/09 0330)  Labs:  Recent Labs  10/27/14 1221 10/27/14 1223 10/28/14 0208 10/28/14 0440  HGB 9.8*  --  10.1*  --   HCT 30.4*  --  30.6*  --   PLT 306  --  273  --   APTT  --  37  --   --   LABPROT  --  15.0  --   --   INR  --  1.17  --   --   HEPARINUNFRC  --   --   --  0.12*  CREATININE  --  0.96 1.07*  --   TROPONINI 0.25*  --  0.30*  --     Estimated Creatinine Clearance: 42.3 mL/min (by C-G formula based on Cr of 1.07).   Medications:  Infusions:  . sodium chloride    . diltiazem (CARDIZEM) infusion 15 mg/hr (10/28/14 0211)  . heparin    . [facility] IV Stopped (10/28/14 7035)    Assessment: Patient with low heparin level.  No heparin issues per RN.  Goal of Therapy:  Heparin level 0.3-0.7 units/ml Monitor platelets by anticoagulation protocol: Yes   Plan:  Increase heparin to 1200 units/hr Recheck level at [place], Shea Stakes Crowford 10/28/2014,5:10 AM

## 2014-10-28 NOTE — ED Notes (Signed)
CARDIO PA at bedside.

## 2014-10-28 NOTE — Progress Notes (Signed)
The order for simvastatin(Zocor) was changed to an equivalent dose of atorvastatin(Lipitor) due to the potential drug interaction with diltiazem.  When taken in combination with medications that inhibit its metabolism, simvastatin can accumulate which increases the risk of liver toxicity, myopathy, or rhabdomyolysis.  Simvastatin dose should not exceed 10mg /day in patients taking verapamil, diltiazem, fibrates, or niacin >or= 1g/day.   Simvastatin dose should not exceed 20mg /day in patients taking amlodipine, ranolazine or amiodarone.   Please consider this potential interaction at discharge.  Denishia Citro A 10/28/2014 3:58 PM

## 2014-10-28 NOTE — Progress Notes (Signed)
Echocardiogram 2D Echocardiogram has been performed.  Amanda Hogan 10/28/2014, 4:00 PM

## 2014-10-29 DIAGNOSIS — J810 Acute pulmonary edema: Secondary | ICD-10-CM

## 2014-10-29 DIAGNOSIS — E876 Hypokalemia: Secondary | ICD-10-CM | POA: Diagnosis present

## 2014-10-29 DIAGNOSIS — I519 Heart disease, unspecified: Secondary | ICD-10-CM

## 2014-10-29 DIAGNOSIS — Z8673 Personal history of transient ischemic attack (TIA), and cerebral infarction without residual deficits: Secondary | ICD-10-CM

## 2014-10-29 DIAGNOSIS — I4891 Unspecified atrial fibrillation: Secondary | ICD-10-CM

## 2014-10-29 DIAGNOSIS — I5189 Other ill-defined heart diseases: Secondary | ICD-10-CM | POA: Diagnosis present

## 2014-10-29 DIAGNOSIS — I270 Primary pulmonary hypertension: Secondary | ICD-10-CM

## 2014-10-29 DIAGNOSIS — I272 Pulmonary hypertension, unspecified: Secondary | ICD-10-CM | POA: Diagnosis present

## 2014-10-29 DIAGNOSIS — I5031 Acute diastolic (congestive) heart failure: Secondary | ICD-10-CM

## 2014-10-29 LAB — URINE CULTURE: Culture: >=100000

## 2014-10-29 LAB — BASIC METABOLIC PANEL
Anion gap: 9 (ref 5–15)
BUN: 22 mg/dL — AB (ref 6–20)
CHLORIDE: 98 mmol/L — AB (ref 101–111)
CO2: 27 mmol/L (ref 22–32)
CREATININE: 1.1 mg/dL — AB (ref 0.44–1.00)
Calcium: 8.3 mg/dL — ABNORMAL LOW (ref 8.9–10.3)
GFR calc non Af Amer: 45 mL/min — ABNORMAL LOW (ref >60)
GFR, EST AFRICAN AMERICAN: 52 mL/min — AB (ref >60)
Glucose, Bld: 104 mg/dL — ABNORMAL HIGH (ref 65–99)
POTASSIUM: 3.3 mmol/L — AB (ref 3.5–5.1)
SODIUM: 134 mmol/L — AB (ref 135–145)

## 2014-10-29 LAB — HEPARIN LEVEL (UNFRACTIONATED)
HEPARIN UNFRACTIONATED: 0.3 [IU]/mL (ref 0.30–0.70)
Heparin Unfractionated: 0.28 IU/mL — ABNORMAL LOW (ref 0.30–0.70)

## 2014-10-29 MED ORDER — HEPARIN SODIUM (PORCINE) 5000 UNIT/ML IJ SOLN: 5000.0000 [IU] | Freq: Three times a day (TID) | INTRAMUSCULAR | Status: DC

## 2014-10-29 MED ORDER — HEPARIN SODIUM (PORCINE) 5000 UNIT/ML IJ SOLN
5000.0000 [IU] | Freq: Three times a day (TID) | INTRAMUSCULAR | Status: DC
  Administered 2014-10-29 – 2014-11-01 (×8): 5000 [IU] via SUBCUTANEOUS
  Filled 2014-10-29 (×9): qty 1

## 2014-10-29 MED ORDER — METOPROLOL TARTRATE 25 MG PO TABS
12.5000 mg | ORAL_TABLET | Freq: Two times a day (BID) | ORAL | Status: DC
  Administered 2014-10-29 – 2014-11-01 (×6): 12.5 mg via ORAL
  Filled 2014-10-29 (×7): qty 1

## 2014-10-29 MED ORDER — MEMANTINE HCL ER 28 MG PO CP24
28.0000 mg | ORAL_CAPSULE | Freq: Every day | ORAL | Status: DC
  Administered 2014-10-29 – 2014-10-31 (×3): 28 mg via ORAL
  Filled 2014-10-29 (×6): qty 1

## 2014-10-29 MED ORDER — POTASSIUM CHLORIDE CRYS ER 20 MEQ PO TBCR
20.0000 meq | EXTENDED_RELEASE_TABLET | Freq: Two times a day (BID) | ORAL | Status: DC
  Administered 2014-10-29 – 2014-10-31 (×4): 20 meq via ORAL
  Filled 2014-10-29 (×4): qty 1

## 2014-10-29 MED ORDER — ROPINIROLE HCL 1 MG PO TABS
2.0000 mg | ORAL_TABLET | Freq: Three times a day (TID) | ORAL | Status: DC | PRN
  Administered 2014-10-29 – 2014-10-31 (×4): 2 mg via ORAL
  Filled 2014-10-29 (×9): qty 2

## 2014-10-29 MED ORDER — POTASSIUM CHLORIDE CRYS ER 20 MEQ PO TBCR
40.0000 meq | EXTENDED_RELEASE_TABLET | Freq: Once | ORAL | Status: AC
  Administered 2014-10-29: 40 meq via ORAL
  Filled 2014-10-29: qty 2

## 2014-10-29 NOTE — Progress Notes (Signed)
[facility] Progress Note   ARTAVIA JEANLOUIS TOI:[id number] DOB: 09-29-1928 DOA: 10/27/2014 PCP: Delia Chimes, NP  Brief narrative: Amanda Hogan is a 81 y.o. female with hypertension, hyperlipidemia, TIAs, recurrent urinary tract infections who presented to the hospital for multiple complaints including generalized body aches and shortness of breath. She was apparently treated for UTI recently while at [place]. In the ER chest x-ray revealed vascular congestion and she then developed A. fib with RVR. She was later noted to have mildly elevated troponin. A cardiology consult was requested.   She was also noted to have a temperature of 100.6, mildly elevated WBC count at 11 and a UA which was suggestive of a UTI.   Subjective: Awake and alert and without complaints. Specifically no shortness of breath, cough, nausea, vomiting, abdominal pain or diarrhea.  Assessment/Plan: Principal Problem:   Acute CHF diastolic/ Pulmonary hypertension-moderate -See echo report below -Continue IV Lasix-she is on 5 L of oxygen today with a pulse ox of 94% - continues to have coarse crackles at bilateral bases -Cardiology following and assisting with management as well  Active Problems: Atrial fibrillation with RVR - This patients CHA2DS2-VASc Score a7 -Transitioned from Cardizem infusion to oral Cardizem-also on metoprolol  -continues to be on IV heparin infusion-cardiology feels that she is not a good candidate for long-term anticoagulation due to dementia and frequent falls    Hypokalemia - replace to keep > 4.0  CKD 3 - stable  UTI/sepsis (Fever & leukocytosis) - Culture "too young to read"-blood cultures negative -Continue levofloxacin     Hypertension  -Takes amlodipine and Aldactone at home which are on hold at this time -Continue ARB, Lopressor, diltiazem    Chest pain-low risk Myoview May 2013/  Troponin level elevated-suspect demand ischemia from AF with RVR -elevated  troponin likely secondary to A. fib with RVR - no other workup recommended at this time by cardiology    Hyperlipidemia -Continue Lipitor    History of TIA (transient ischemic attack) -On aspirin and Plavix at home    Memory disorder, possibly not taking home meds at times-  -Continue Namenda    Anxiety -Takes when necessary Xanax at home a statin medication reconciliation -is not on it here-continue to follow-did not complain of anxiety this morning  Urinary incontinence -Continue Detrol  Frequent UTIs -Continue trimethoprim which she takes daily   Atrial fibrillation with RVR (new)   Appt with PCP: requested Code Status: DNR Family Communication:  Disposition Plan: PT eval to determine where she will transition to DVT prophylaxis: Heparin infusion Consultants:Cardiology Procedures: 2-D echo Left ventricle: The cavity size was normal. Systolic function was normal. The estimated ejection fraction was in the range of 60% to 65%. Wall motion was normal; there were no regional wall motion abnormalities. Features are consistent with a pseudonormal left ventricular filling pattern, with concomitant abnormal relaxation and increased filling pressure (grade 2 diastolic dysfunction). Doppler parameters are consistent with high ventricular filling pressure. - Aortic valve: Trileaflet; mildly thickened, mildly calcified leaflets. There was mild regurgitation. - Mitral valve: There was mild regurgitation. - Left atrium: The atrium was mildly dilated. - Right atrium: The atrium was mildly dilated. - Tricuspid valve: There was moderate regurgitation. - Pulmonary arteries: PA peak pressure: 54 mm Hg (S).  Antibiotics: Anti-infectives    Start     Dose/Rate Route Frequency Ordered Stop   10/29/14 1600  Levofloxacin (LEVAQUIN) IVPB 250 mg     250 mg 50 mL/hr over 60 Minutes Intravenous Every  24 hours 10/28/14 1529     10/28/14 1600  levofloxacin (LEVAQUIN) IVPB  500 mg  Status:  Discontinued     500 mg 100 mL/hr over 60 Minutes Intravenous Every 24 hours 10/27/14 1629 10/28/14 1529   10/28/14 0145  trimethoprim (TRIMPEX) tablet 100 mg     100 mg Oral Daily at bedtime 10/28/14 0153     10/27/14 1630  levofloxacin (LEVAQUIN) IVPB 500 mg     500 mg 100 mL/hr over 60 Minutes Intravenous NOW 10/27/14 1629 10/27/14 1943      Objective: Filed Weights   10/27/14 1156 10/28/14 1500 10/29/14 0400  Weight: 86.183 kg (190 lb) 87.9 kg (193 lb 12.6 oz) 86.7 kg (191 lb 2.2 oz)    Intake/Output Summary (Last 24 hours) at 10/29/14 1251 Last data filed at 10/29/14 1136  Gross per 24 hour  Intake  590.7 ml  Output   2500 ml  Net -1909.3 ml     Vitals Filed Vitals:   10/29/14 0600 10/29/14 0815 10/29/14 1108 10/29/14 1150  BP: 135/54  108/42   Pulse: 66  77   Temp:  98.5 F (36.9 C)  98.6 F (37 C)  TempSrc:  Axillary  Oral  Resp: 21     Height:      Weight:      SpO2: 97%       Exam:  General:  Pt is alert, not in acute distress  HEENT: No icterus, No thrush, oral mucosa moist  Cardiovascular: regular rate and rhythm, S1/S2 No murmur  Respiratory: clear to auscultation bilaterally   Abdomen: Soft, +Bowel sounds, non tender, non distended, no guarding  MSK: No LE edema, cyanosis or clubbing  Data Reviewed: Basic Metabolic Panel:  Recent Labs Lab 10/27/14 1223 10/28/14 0208 10/28/14 0440 10/28/14 1151 10/29/14 0058  NA 136  --  136  --  134*  K 3.5  --  3.0*  --  3.3*  CL 100*  --  99*  --  98*  CO2 28  --  26  --  27  GLUCOSE 128*  --  108*  --  104*  BUN 20  --  22*  --  22*  CREATININE 0.96 1.07* 1.06*  --  1.10*  CALCIUM 9.0  --  8.4*  --  8.3*  MG  --   --   --  1.8  --    Liver Function Tests:  Recent Labs Lab 10/27/14 1223  AST 20  ALT 22  ALKPHOS 92  BILITOT 1.4*  PROT 7.9  ALBUMIN 3.5   No results for input(s): LIPASE, AMYLASE in the last 168 hours. No results for input(s): AMMONIA in the last 168  hours. CBC:  Recent Labs Lab 10/27/14 1221 10/28/14 0208  WBC 11.0* 9.3  NEUTROABS 8.7*  --   HGB 9.8* 10.1*  HCT 30.4* 30.6*  MCV 94.1 92.4  PLT 306 273   Cardiac Enzymes:  Recent Labs Lab 10/27/14 1221 10/28/14 0208 10/28/14 0756 10/28/14 1410  TROPONINI 0.25* 0.30* 0.25* 0.18*   BNP (last 3 results)  Recent Labs  10/28/14 0440  BNP 715.8*    ProBNP (last 3 results) No results for input(s): PROBNP in the last 8760 hours.  CBG: No results for input(s): GLUCAP in the last 168 hours.  Recent Results (from the past 240 hour(s))  Culture, blood (routine x 2)     Status: None (Preliminary result)   Collection Time: 10/27/14 12:20 PM  Result Value Ref Range  Status   Specimen Description BLOOD LEFT ANTECUBITAL  Final   Special Requests BOTTLES DRAWN AEROBIC AND ANAEROBIC 5CC  Final   Culture   Final    NO GROWTH 1 DAY Performed at [facility]    Report Status PENDING  Incomplete  Culture, blood (routine x 2)     Status: None (Preliminary result)   Collection Time: 10/27/14 12:38 PM  Result Value Ref Range Status   Specimen Description BLOOD RIGHT HAND  Final   Special Requests BOTTLES DRAWN AEROBIC AND ANAEROBIC 5ML  Final   Culture   Final    NO GROWTH 1 DAY Performed at [facility]    Report Status PENDING  Incomplete  Urine culture     Status: None (Preliminary result)   Collection Time: 10/27/14  2:51 PM  Result Value Ref Range Status   Specimen Description URINE, CATHETERIZED  Final   Special Requests NONE  Final   Culture   Final    TOO YOUNG TO READ Performed at [facility]    Report Status PENDING  Incomplete  MRSA PCR Screening     Status: Abnormal   Collection Time: 10/28/14  1:44 PM  Result Value Ref Range Status   MRSA by PCR POSITIVE (A) NEGATIVE Final    Comment:        The GeneXpert MRSA Assay (FDA approved for NASAL specimens only), is one component of a comprehensive MRSA colonization surveillance  program. It is not intended to diagnose MRSA infection nor to guide or monitor treatment for MRSA infections. RESULT CALLED TO, READ BACK BY AND VERIFIED WITH: STROUP,K. RN @1557  ON 8.9.16 BY MCCOY,N.      Studies: Dg Chest Port 1 View  10/27/2014   CLINICAL DATA:  Shortness of breath and lethargy  EXAM: PORTABLE CHEST - 1 VIEW  COMPARISON:  11/30/2012  FINDINGS: There is moderate cardiac enlargement. Aortic atherosclerosis identified. There is mild diffuse interstitial edema identified. Atelectasis noted in the right lung base.  IMPRESSION: 1. Cardiac enlargement and mild pulmonary edema. 2. Right base atelectasis. 3. Aortic atherosclerosis.   Electronically Signed   By: Kerby Moors M.D.   On: 10/27/2014 14:40    Scheduled Meds:  Scheduled Meds: . aspirin EC  81 mg Oral Daily  . atorvastatin  10 mg Oral q1800  . citalopram  20 mg Oral Daily  . diltiazem  120 mg Oral Daily  . furosemide  40 mg Intravenous Q12H  . irbesartan  150 mg Oral Daily  . levofloxacin (LEVAQUIN) IV  250 mg Intravenous Q24H  . memantine  10 mg Oral BID  . metoprolol tartrate  12.5 mg Oral BID  . oxybutynin  5 mg Oral QHS  . pantoprazole  40 mg Oral BID AC  . potassium chloride  20 mEq Oral BID  . sodium chloride  3 mL Intravenous Q12H  . trimethoprim  100 mg Oral QHS   Continuous Infusions: . heparin 1,400 Units/hr (10/29/14 1139)    Time spent on care of this patient: 35 min   [place], MD 10/29/2014, 12:51 PM  LOS: 2 days   [facility] Office  [phone] Pager - Text Page per [email or web address] If 7PM-7AM, please contact night-coverage [email or web address]

## 2014-10-29 NOTE — Care Management Important Message (Signed)
Important Message  Patient Details IM Letter given to Rhonda/ Case Manager to present to patientImportant Message  Patient Details  Name: Amanda Hogan MRN: [id number] Date of Birth: Oct 21, 1928   Medicare Important Message Given:  Yes-second notification given    Camillo Flaming 10/29/2014, 11:39 AM Name: Amanda Hogan MRN: [id number] Date of Birth: Nov 09, 1928   Medicare Important Message Given:  Yes-second notification given    Camillo Flaming 10/29/2014, 11:39 AM

## 2014-10-29 NOTE — Progress Notes (Signed)
ANTICOAGULATION CONSULT NOTE - Follow Up Consult  Pharmacy Consult for Heparin Indication: atrial fibrillation  Allergies  Allergen Reactions  . Contrast Media [Iodinated Diagnostic Agents]   . Iohexol      Desc: ITCHING/RESPIRATORY DISTRESS     Patient Measurements: Height: 5\' 4"  (162.6 cm) Weight: 191 lb 2.2 oz (86.7 kg) IBW/kg (Calculated) : 54.7 HEPARIN DW (KG): 74.2  Vital Signs: Temp: 98.5 F (36.9 C) (08/10 0815) Temp Source: Axillary (08/10 0815) BP: 135/54 mmHg (08/10 0600) Pulse Rate: 66 (08/10 0600)  Labs:  Recent Labs  10/27/14 1221  10/27/14 1223 10/28/14 0208 10/28/14 0440 10/28/14 0756 10/28/14 1410 10/29/14 0058  HGB 9.8*  --   --  10.1*  --   --   --   --   HCT 30.4*  --   --  30.6*  --   --   --   --   PLT 306  --   --  273  --   --   --   --   APTT  --   --  37  --   --   --   --   --   LABPROT  --   --  15.0  --   --   --   --   --   INR  --   --  1.17  --   --   --   --   --   HEPARINUNFRC  --   --   --   --  0.12*  --  0.19* 0.30  CREATININE  --   < > 0.96 1.07* 1.06*  --   --  1.10*  TROPONINI 0.25*  --   --  0.30*  --  0.25* 0.18*  --   < > = values in this interval not displayed.  Estimated Creatinine Clearance: 41.3 mL/min (by C-G formula based on Cr of 1.1).   Medications:  Infusions:  . heparin 1,400 Units/hr (10/29/14 0600)    Assessment: Amanda Hogan with hx TIA presents with dyspnea and SOB felt to be related to acute CHF (no recent ECHO). Pt also started on levaquin for acute bronchitis/SIRS. Pt developed atrial fibrillation with RVR and pharmacy consulted to dose IV heparin. It is noted that patient is not good candidate for long-term anticoagulation per cardiology. Heparin was therapeutic last night, however repeat level now slightly <goal. No bleeding noted.   Goal of Therapy:  Heparin level 0.3-0.7 units/ml Monitor platelets by anticoagulation protocol: Yes   Plan:  Increase heparin infusion to 1500 units/hr Recheck  8h heparin level if continues Daily heparin level & CBC while on heparin   [name], [name] Drafts 10/29/2014,10:40 AM

## 2014-10-29 NOTE — Progress Notes (Signed)
ANTICOAGULATION CONSULT NOTE - Follow Up Consult  Pharmacy Consult for Heparin Indication: atrial fibrillation  Allergies  Allergen Reactions  . Contrast Media [Iodinated Diagnostic Agents]   . Iohexol      Desc: ITCHING/RESPIRATORY DISTRESS     Patient Measurements: Height: 5\' 4"  (162.6 cm) Weight: 193 lb 12.6 oz (87.9 kg) IBW/kg (Calculated) : 54.7 Heparin Dosing Weight:   Vital Signs: Temp: 98.7 F (37.1 C) (08/10 0000) Temp Source: Axillary (08/10 0000) BP: 134/69 mmHg (08/10 0300) Pulse Rate: 67 (08/10 0300)  Labs:  Recent Labs  10/27/14 1221  10/27/14 1223 10/28/14 0208 10/28/14 0440 10/28/14 0756 10/28/14 1410 10/29/14 0058  HGB 9.8*  --   --  10.1*  --   --   --   --   HCT 30.4*  --   --  30.6*  --   --   --   --   PLT 306  --   --  273  --   --   --   --   APTT  --   --  37  --   --   --   --   --   LABPROT  --   --  15.0  --   --   --   --   --   INR  --   --  1.17  --   --   --   --   --   HEPARINUNFRC  --   --   --   --  0.12*  --  0.19* 0.30  CREATININE  --   < > 0.96 1.07* 1.06*  --   --  1.10*  TROPONINI 0.25*  --   --  0.30*  --  0.25* 0.18*  --   < > = values in this interval not displayed.  Estimated Creatinine Clearance: 41.6 mL/min (by C-G formula based on Cr of 1.1).   Medications:  Infusions:  . heparin 1,400 Units/hr (10/28/14 2100)    Assessment: Patient with heparin level at goal, but lowest end of goal.  No heparin issues noted.  Goal of Therapy:  Heparin level 0.3-0.7 units/ml Monitor platelets by anticoagulation protocol: Yes   Plan:  Will continue with current rate at this time.   Will not increase per dosing nomogram due to age, indication of afib and expect the level to continue to increase slightly. Recheck level at 0900.  Tyler Deis, Shea Stakes Crowford 10/29/2014,4:31 AM

## 2014-10-29 NOTE — Progress Notes (Signed)
Subjective:  Up in bed, a little flustered but NAD  Objective:  Vital Signs in the last 24 hours: Temp:  [97.9 F (36.6 C)-98.7 F (37.1 C)] 98.5 F (36.9 C) (08/10 0815) Pulse Rate:  [66-86] 66 (08/10 0600) Resp:  [17-31] 21 (08/10 0600) BP: (82-172)/(23-87) 135/54 mmHg (08/10 0600) SpO2:  [90 %-99 %] 97 % (08/10 0600) Weight:  [191 lb 2.2 oz (86.7 kg)-193 lb 12.6 oz (87.9 kg)] 191 lb 2.2 oz (86.7 kg) (08/10 0400)  Intake/Output from previous day:  Intake/Output Summary (Last 24 hours) at 10/29/14 0930 Last data filed at 10/29/14 0600  Gross per 24 hour  Intake  590.7 ml  Output   2100 ml  Net -1509.3 ml    Physical Exam: General appearance: alert, cooperative, no distress and moderately obese Neck: no carotid bruit Lungs: few basilar crackles Heart: regular rate and rhythm and 2/6 systolic murmur AOV and LSB   Rate: 80  Rhythm: normal sinus rhythm  Lab Results:  Recent Labs  10/27/14 1221 10/28/14 0208  WBC 11.0* 9.3  HGB 9.8* 10.1*  PLT 306 273    Recent Labs  10/28/14 0440 10/29/14 0058  NA 136 134*  K 3.0* 3.3*  CL 99* 98*  CO2 26 27  GLUCOSE 108* 104*  BUN 22* 22*  CREATININE 1.06* 1.10*    Recent Labs  10/28/14 0756 10/28/14 1410  TROPONINI 0.25* 0.18*    Recent Labs  10/27/14 1223  INR 1.17    Scheduled Meds: . aspirin EC  81 mg Oral Daily  . atorvastatin  10 mg Oral q1800  . citalopram  20 mg Oral Daily  . diltiazem  120 mg Oral Daily  . furosemide  40 mg Intravenous Q12H  . irbesartan  150 mg Oral Daily  . levofloxacin (LEVAQUIN) IV  250 mg Intravenous Q24H  . memantine  10 mg Oral BID  . oxybutynin  5 mg Oral QHS  . pantoprazole  40 mg Oral BID AC  . potassium chloride  20 mEq Oral BID  . potassium chloride  40 mEq Oral Once  . sodium chloride  3 mL Intravenous Q12H  . trimethoprim  100 mg Oral QHS   Continuous Infusions: . heparin 1,400 Units/hr (10/29/14 0600)   PRN Meds:.sodium chloride, acetaminophen,  ALPRAZolam, nitroGLYCERIN, ondansetron (ZOFRAN) IV, sodium chloride   Imaging: Dg Chest Port 1 View  10/27/2014   CLINICAL DATA:  Shortness of breath and lethargy  EXAM: PORTABLE CHEST - 1 VIEW  COMPARISON:  11/30/2012  FINDINGS: There is moderate cardiac enlargement. Aortic atherosclerosis identified. There is mild diffuse interstitial edema identified. Atelectasis noted in the right lung base.  IMPRESSION: 1. Cardiac enlargement and mild pulmonary edema. 2. Right base atelectasis. 3. Aortic atherosclerosis.   Electronically Signed   By: Kerby Moors M.D.   On: 10/27/2014 14:40    Cardiac Studies: Echo 10/28/14 Study Conclusions  - Left ventricle: The cavity size was normal. Systolic function was normal. The estimated ejection fraction was in the range of 60% to 65%. Wall motion was normal; there were no regional wall motion abnormalities. Features are consistent with a pseudonormal left ventricular filling pattern, with concomitant abnormal relaxation and increased filling pressure (grade 2 diastolic dysfunction). Doppler parameters are consistent with high ventricular filling pressure. - Aortic valve: Trileaflet; mildly thickened, mildly calcified leaflets. There was mild regurgitation. - Mitral valve: There was mild regurgitation. - Left atrium: The atrium was mildly dilated. - Right atrium: The atrium was mildly  dilated. - Tricuspid valve: There was moderate regurgitation. - Pulmonary arteries: PA peak pressure: 54 mm Hg (S).  Impressions:  - The right ventricular systolic pressure was increased consistent with moderate pulmonary hypertension.    Assessment/Plan:  81 y.o. widow with a PMH significant for memory disorder and dementia. She has had problems in the past with chest pain and uncontrolled HTN felt to be related to medication non compliance. She was admitted 10/27/14 with acute CHF and AF with RVR (new for her). She converted spontaneously to NSR. She  has diuresed 2.2 L.   Principal Problem:   Acute CHF Active Problems:   Atrial fibrillation with RVR (new)   Hypertension, at times poorly controlled   Memory disorder, possibly not taking home meds at times   Troponin level elevated-suspect demand ischemia from AF with RVR   Hypokalemia   Diastolic dysfunction-grade 2   Chest pain-low risk Myoview May 2013   Hyperlipidemia   History of TIA (transient ischemic attack)   Chronic back pain   Anxiety   Lymphoma history   Pulmonary hypertension-moderate   PLAN: I spoke with family. I suspect she was taking her medications incorrectly again. I reinforced the importance of medication compliance with pt and family. Dr Gwenlyn Found did not feel she was a candidate for anticoagulation secondary to history of falls and compliance, should be OK to stop Heparin but will discuss with Dr Gwenlyn Found today first.  Keep K+ close to 4.0- extra K+ this am. I think she still needs further diuresis, continue IV Lasix another day, check BNP in am. Resume beta blocker.  Kerin Ransom PA-C 10/29/2014, 9:30 AM [phone]  Agree with note written by Kerin Ransom [facility]  Maintaining NSR. Diuresing nicely. Clinically improved. Less SOB. Getting ATBX for UTI. Can transition from IV--->> PO diuretics tomorrow and Tx tele. Suspect she will be able to go home over the weekend. 2D showed nl LV FXN, G@DD , mod Pulm HTN. Would change IV hep to DVT prophyaxis. I don't think she is a candidate for long term oral AC.   Quay Burow 10/29/2014 1:20 PM

## 2014-10-29 NOTE — Care Management Note (Signed)
Case Management Note  Patient Details  Name: Amanda Hogan MRN: [id number] Date of Birth: Nov 17, 1928  Subjective/Objective:        A.fib in presence of chf            Action/Plan:Date:  October 29, 2014 U.R. performed for needs and level of care. Will continue to follow for Case Management needs.  Velva Harman, RN, BSN, [place]   [phone]   Expected Discharge Date:   (UNKNOWN)               Expected Discharge Plan:  [place]  In-House Referral:  NA  Discharge planning Services  CM Consult  Post Acute Care Choice:    Choice offered to:  Patient  DME Arranged:    DME Agency:     HH Arranged:  Disease Management [place] Agency:     Status of Service:  In process, will continue to follow  Medicare Important Message Given:    Date Medicare IM Given:    Medicare IM give by:    Date Additional Medicare IM Given:    Additional Medicare Important Message give by:     If discussed at [place] of Stay Meetings, dates discussed:    Additional Comments:  Leeroy Cha, RN 10/29/2014, 10:07 AM

## 2014-10-30 ENCOUNTER — Inpatient Hospital Stay (HOSPITAL_COMMUNITY): Payer: PPO

## 2014-10-30 DIAGNOSIS — J810 Acute pulmonary edema: Secondary | ICD-10-CM

## 2014-10-30 DIAGNOSIS — I5041 Acute combined systolic (congestive) and diastolic (congestive) heart failure: Secondary | ICD-10-CM

## 2014-10-30 LAB — BASIC METABOLIC PANEL
Anion gap: 9 (ref 5–15)
BUN: 27 mg/dL — AB (ref 6–20)
CO2: 28 mmol/L (ref 22–32)
Calcium: 8.5 mg/dL — ABNORMAL LOW (ref 8.9–10.3)
Chloride: 98 mmol/L — ABNORMAL LOW (ref 101–111)
Creatinine, Ser: 1.42 mg/dL — ABNORMAL HIGH (ref 0.44–1.00)
GFR calc Af Amer: 38 mL/min — ABNORMAL LOW (ref >60)
GFR calc non Af Amer: 33 mL/min — ABNORMAL LOW (ref >60)
Glucose, Bld: 106 mg/dL — ABNORMAL HIGH (ref 65–99)
POTASSIUM: 4.2 mmol/L (ref 3.5–5.1)
SODIUM: 135 mmol/L (ref 135–145)

## 2014-10-30 LAB — BRAIN NATRIURETIC PEPTIDE: B Natriuretic Peptide: 603.2 pg/mL — ABNORMAL HIGH (ref 0.0–100.0)

## 2014-10-30 MED ORDER — CETYLPYRIDINIUM CHLORIDE 0.05 % MT LIQD
7.0000 mL | Freq: Two times a day (BID) | OROMUCOSAL | Status: DC
  Administered 2014-10-30 – 2014-11-01 (×5): 7 mL via OROMUCOSAL

## 2014-10-30 MED ORDER — MENTHOL 3 MG MT LOZG
1.0000 | LOZENGE | OROMUCOSAL | Status: DC | PRN
  Administered 2014-10-30: 3 mg via ORAL
  Filled 2014-10-30: qty 9

## 2014-10-30 MED ORDER — FUROSEMIDE 40 MG PO TABS: 40.0000 mg | ORAL_TABLET | Freq: Two times a day (BID) | ORAL | Status: DC

## 2014-10-30 MED ORDER — CHLORHEXIDINE GLUCONATE CLOTH 2 % EX PADS
6.0000 | MEDICATED_PAD | Freq: Every day | CUTANEOUS | Status: DC
  Administered 2014-10-30 – 2014-11-01 (×4): 6 via TOPICAL

## 2014-10-30 MED ORDER — FUROSEMIDE 40 MG PO TABS
40.0000 mg | ORAL_TABLET | Freq: Two times a day (BID) | ORAL | Status: DC
  Administered 2014-10-31: 40 mg via ORAL
  Filled 2014-10-30: qty 1

## 2014-10-30 MED ORDER — MUPIROCIN 2 % EX OINT
1.0000 "application " | TOPICAL_OINTMENT | Freq: Two times a day (BID) | CUTANEOUS | Status: DC
  Administered 2014-10-30 – 2014-11-01 (×5): 1 via NASAL
  Filled 2014-10-30 (×2): qty 22

## 2014-10-30 NOTE — Progress Notes (Addendum)
[facility] Progress Note   Amanda Hogan XTG:[id number] DOB: 1928-05-25 DOA: 10/27/2014 PCP: Delia Chimes, NP  Brief narrative: Amanda Hogan is a 81 y.o. female with hypertension, hyperlipidemia, TIAs, recurrent urinary tract infections who presented to the hospital for multiple complaints including generalized body aches and shortness of breath. She was apparently treated for UTI recently while at [facility]. In the ER chest x-ray revealed vascular congestion and she then developed A. fib with RVR. She was later noted to have mildly elevated troponin. A cardiology consult was requested.   She was also noted to have a temperature of 100.6, mildly elevated WBC count at 11 and a UA which was suggestive of a UTI.   Subjective: Awake and alert and sitting up in a chair. No complaints of cough, palpitations, shortness of breath or chest pain. Daughter is at bedside and have spoken with her. It appears that she was admitted to the hospital at [facility] for about 2 days and was discharged on Friday which was 8/5 with a prescription for Levaquin. The Levaquin was called into pharmacy in [place] and they were not able to pick it up prior to her being readmitted.  Assessment/Plan: Principal Problem:   Acute CHF- diastolic/ Pulmonary hypertension-moderate -See echo report below -Continue Lasix-  cardiology recommend that it be switched to oral today-I have changed it to 40 oral twice a day-able to be weaned down to room air with a pulse ox of 92%-this tests dropped to 87% when she ambulates -  coarse crackles at bilateral bases improved -  chest x-ray shows mild pulmonary edema and atelectasis -Have started incentive spirometry -Cardiology following and assisting with management as well  Active Problems: Atrial fibrillation with RVR - This patients CHA2DS2-VASc Score a7 -Transitioned from Cardizem infusion to oral Cardizem-also on metoprolol  -Heparin infusion discontinued  yesterday by cardiology-Dr. Gwenlyn Found feels that she is not a good candidate for long-term anticoagulation due to dementia and frequent falls    Hypokalemia - replaced-  keep > 4.0  AK I CKD 3 - Secondary to diuretics  UTI/sepsis (Fever & leukocytosis) - Culture reveals yeast greater than 100,000 colonies-blood cultures negative - Was discharged on Levaquin on 8/5 from a hospital in [facility]- I suspect she had 2-3 days of it while she was in the hospital -has received levofloxacin x 4 days including today - we'll stop today    Hypertension  -Takes amlodipine and Aldactone at home which are on hold at this time -Continue ARB, Lopressor, diltiazem    Chest pain-low risk Myoview May 2013/  Troponin level elevated-suspect demand ischemia from AF with RVR -elevated troponin likely secondary to A. fib with RVR - no other workup recommended at this time by cardiology    Hyperlipidemia -Continue Lipitor    History of TIA (transient ischemic attack) -On aspirin and Plavix at home    Memory disorder, possibly not taking home meds at times-  -Continue Namenda -concern that she is noncompliant with medications at home -Family has arranged 24 hour caretaker for her home    Anxiety -Takes when necessary Xanax at home a statin medication reconciliation -is not on it here-continue to follow-did not complain of anxiety this morning  Urinary incontinence -Continue Detrol  Frequent UTIs -Continue trimethoprim which she takes daily   Appt with PCP: requested Code Status: DNR Family Communication:  Disposition Plan: Home Health PT-per daughter, due to dementia, the patient will have someone with her 24 hours at least for the next  week and arrangements are being made so she has someone with her after that as well DVT prophylaxis: Heparin infusion Consultants:Cardiology Procedures: 2-D echo Left ventricle: The cavity size was normal. Systolic function was normal. The estimated ejection  fraction was in the range of 60% to 65%. Wall motion was normal; there were no regional wall motion abnormalities. Features are consistent with a pseudonormal left ventricular filling pattern, with concomitant abnormal relaxation and increased filling pressure (grade 2 diastolic dysfunction). Doppler parameters are consistent with high ventricular filling pressure. - Aortic valve: Trileaflet; mildly thickened, mildly calcified leaflets. There was mild regurgitation. - Mitral valve: There was mild regurgitation. - Left atrium: The atrium was mildly dilated. - Right atrium: The atrium was mildly dilated. - Tricuspid valve: There was moderate regurgitation. - Pulmonary arteries: PA peak pressure: 54 mm Hg (S).  Antibiotics: Anti-infectives    Start     Dose/Rate Route Frequency Ordered Stop   10/29/14 1600  Levofloxacin (LEVAQUIN) IVPB 250 mg     250 mg 50 mL/hr over 60 Minutes Intravenous Every 24 hours 10/28/14 1529     10/28/14 1600  levofloxacin (LEVAQUIN) IVPB 500 mg  Status:  Discontinued     500 mg 100 mL/hr over 60 Minutes Intravenous Every 24 hours 10/27/14 1629 10/28/14 1529   10/28/14 0145  trimethoprim (TRIMPEX) tablet 100 mg     100 mg Oral Daily at bedtime 10/28/14 0153     10/27/14 1630  levofloxacin (LEVAQUIN) IVPB 500 mg     500 mg 100 mL/hr over 60 Minutes Intravenous NOW 10/27/14 1629 10/27/14 1943      Objective: Filed Weights   10/28/14 1500 10/29/14 0400 10/30/14 0532  Weight: 87.9 kg (193 lb 12.6 oz) 86.7 kg (191 lb 2.2 oz) 86.8 kg (191 lb 5.8 oz)    Intake/Output Summary (Last 24 hours) at 10/30/14 1313 Last data filed at 10/30/14 1150  Gross per 24 hour  Intake    550 ml  Output   1800 ml  Net  -1250 ml     Vitals Filed Vitals:   10/30/14 1000 10/30/14 1140 10/30/14 1155 10/30/14 1233  BP:   112/40 120/52  Pulse: 66 60 67 65  Temp:    97.7 F (36.5 C)  TempSrc:      Resp: 15 18 25 20   Height:      Weight:      SpO2: 91% 86%  94% 95%    Exam:  General:  Pt is alert, not in acute distress  HEENT: No icterus, No thrush, oral mucosa moist  Cardiovascular: regular rate and rhythm, S1/S2 - 2/6 murmur at right upper sternal border  Respiratory: clear to auscultation bilaterally   Abdomen: Soft, +Bowel sounds, non tender, non distended, no guarding  MSK: No LE edema, cyanosis or clubbing  Data Reviewed: Basic Metabolic Panel:  Recent Labs Lab 10/27/14 1223 10/28/14 0208 10/28/14 0440 10/28/14 1151 10/29/14 0058 10/30/14 0340  NA 136  --  136  --  134* 135  K 3.5  --  3.0*  --  3.3* 4.2  CL 100*  --  99*  --  98* 98*  CO2 28  --  26  --  27 28  GLUCOSE 128*  --  108*  --  104* 106*  BUN 20  --  22*  --  22* 27*  CREATININE 0.96 1.07* 1.06*  --  1.10* 1.42*  CALCIUM 9.0  --  8.4*  --  8.3* 8.5*  MG  --   --   --  1.8  --   --    Liver Function Tests:  Recent Labs Lab 10/27/14 1223  AST 20  ALT 22  ALKPHOS 92  BILITOT 1.4*  PROT 7.9  ALBUMIN 3.5   No results for input(s): LIPASE, AMYLASE in the last 168 hours. No results for input(s): AMMONIA in the last 168 hours. CBC:  Recent Labs Lab 10/27/14 1221 10/28/14 0208  WBC 11.0* 9.3  NEUTROABS 8.7*  --   HGB 9.8* 10.1*  HCT 30.4* 30.6*  MCV 94.1 92.4  PLT 306 273   Cardiac Enzymes:  Recent Labs Lab 10/27/14 1221 10/28/14 0208 10/28/14 0756 10/28/14 1410  TROPONINI 0.25* 0.30* 0.25* 0.18*   BNP (last 3 results)  Recent Labs  10/28/14 0440 10/30/14 0340  BNP 715.8* 603.2*    ProBNP (last 3 results) No results for input(s): PROBNP in the last 8760 hours.  CBG: No results for input(s): GLUCAP in the last 168 hours.  Recent Results (from the past 240 hour(s))  Culture, blood (routine x 2)     Status: None (Preliminary result)   Collection Time: 10/27/14 12:20 PM  Result Value Ref Range Status   Specimen Description BLOOD LEFT ANTECUBITAL  Final   Special Requests BOTTLES DRAWN AEROBIC AND ANAEROBIC 5CC  Final    Culture   Final    NO GROWTH 3 DAYS Performed at [facility]    Report Status PENDING  Incomplete  Culture, blood (routine x 2)     Status: None (Preliminary result)   Collection Time: 10/27/14 12:38 PM  Result Value Ref Range Status   Specimen Description BLOOD RIGHT HAND  Final   Special Requests BOTTLES DRAWN AEROBIC AND ANAEROBIC 5ML  Final   Culture   Final    NO GROWTH 3 DAYS Performed at [facility]    Report Status PENDING  Incomplete  Urine culture     Status: None   Collection Time: 10/27/14  2:51 PM  Result Value Ref Range Status   Specimen Description URINE, CATHETERIZED  Final   Special Requests NONE  Final   Culture   Final    >=100,000 COLONIES/mL YEAST Performed at [facility]    Report Status 10/29/2014 FINAL  Final  MRSA PCR Screening     Status: Abnormal   Collection Time: 10/28/14  1:44 PM  Result Value Ref Range Status   MRSA by PCR POSITIVE (A) NEGATIVE Final    Comment:        The GeneXpert MRSA Assay (FDA approved for NASAL specimens only), is one component of a comprehensive MRSA colonization surveillance program. It is not intended to diagnose MRSA infection nor to guide or monitor treatment for MRSA infections. RESULT CALLED TO, READ BACK BY AND VERIFIED WITH: STROUP,K. RN @1557  ON 8.9.16 BY MCCOY,N.      Studies: Dg Chest Port 1 View  10/30/2014   CLINICAL DATA:  Pulmonary edema.  EXAM: PORTABLE CHEST - 1 VIEW  COMPARISON:  10/27/2014  FINDINGS: The cardiac silhouette remains enlarged. Thoracic aortic calcification is again seen. There are mild interstitial densities bilaterally, improved from the prior study. There is improved aeration of the right lung base. Mild left basilar opacity is present and likely reflects atelectasis. There may be a trace left pleural effusion. No pneumothorax is seen.  IMPRESSION: 1. Mild pulmonary edema, improved from prior. 2. Left basilar atelectasis.   Electronically Signed   By: Logan Bores   On: 10/30/2014 11:14    Scheduled  Meds:  Scheduled Meds: . antiseptic oral rinse  7 mL Mouth Rinse BID  . aspirin EC  81 mg Oral Daily  . atorvastatin  10 mg Oral q1800  . Chlorhexidine Gluconate Cloth  6 each Topical Q0600  . citalopram  20 mg Oral Daily  . diltiazem  120 mg Oral Daily  . furosemide  40 mg Intravenous Q12H  . heparin subcutaneous  5,000 Units Subcutaneous 3 times per day  . irbesartan  150 mg Oral Daily  . levofloxacin (LEVAQUIN) IV  250 mg Intravenous Q24H  . memantine  28 mg Oral QHS  . metoprolol tartrate  12.5 mg Oral BID  . mupirocin ointment  1 application Nasal BID  . oxybutynin  5 mg Oral QHS  . pantoprazole  40 mg Oral BID AC  . potassium chloride  20 mEq Oral BID  . sodium chloride  3 mL Intravenous Q12H  . trimethoprim  100 mg Oral QHS   Continuous Infusions:    Time spent on care of this patient: 35 min   [place], MD 10/30/2014, 1:13 PM  LOS: 3 days   [facility] Office  [phone] Pager - Text Page per [email or web address] If 7PM-7AM, please contact night-coverage [email or web address]

## 2014-10-30 NOTE — Progress Notes (Signed)
Amanda Hogan is a 81 y.o. female with hypertension, hyperlipidemia, TIAs, recurrent urinary tract infections who presented to the hospital for multiple complaints including generalized body aches and shortness of breath. She was apparently treated for UTI recently while at [facility]. In the ER chest x-ray revealed vascular congestion and she then developed A. fib with RVR. She was later noted to have mildly elevated troponin. She converted spontaneously to NSR. She was also noted to have a temperature of 100.6, mildly elevated WBC count at 11 and a UA which was suggestive of a UTI.   Subjective: No chest pain, only complains of back pain   Objective: Vital signs in last 24 hours: Temp:  [98.2 F (36.8 C)-98.7 F (37.1 C)] 98.2 F (36.8 C) (08/11 0334) Pulse Rate:  [56-77] 59 (08/11 0400) Resp:  [17-21] 17 (08/11 0400) BP: (95-120)/(42-54) 112/44 mmHg (08/11 0400) SpO2:  [94 %-100 %] 97 % (08/11 0400) Weight:  [191 lb 5.8 oz (86.8 kg)] 191 lb 5.8 oz (86.8 kg) (08/11 0532) Weight change: -2 lb 6.8 oz (-1.1 kg) Last BM Date:  (PTA) Intake/Output from previous day: -1240 08/10 0701 - 08/11 0700 In: 310 [P.O.:240; I.V.:20; IV Piggyback:50] Out: 1550 [Urine:1550] Intake/Output this shift:    PE: General:Pleasant affect, NAD Skin:Warm and dry, brisk capillary refill HEENT:normocephalic, sclera clear, mucus membranes moist Neck:supple, no JVD  Heart:S1S2 RRR with 2/6 harsh murmur, no gallup, rub or click Lungs:clear without rales, rhonchi, or wheezes OBS:JGGE, non tender, + BS, do not palpate liver spleen or masses Ext:no lower ext edema, 2+ pedal pulses, 2+ radial pulses Neuro:alert and oriented, MAE, follows commands, + facial symmetry Tele: SR    Lab Results:  Recent Labs  10/27/14 1221 10/28/14 0208  WBC 11.0* 9.3  HGB 9.8* 10.1*  HCT 30.4* 30.6*  PLT 306 273   BMET  Recent Labs  10/29/14 0058 10/30/14 0340  NA 134* 135  K 3.3* 4.2  CL 98* 98*    CO2 27 28  GLUCOSE 104* 106*  BUN 22* 27*  CREATININE 1.10* 1.42*  CALCIUM 8.3* 8.5*    Recent Labs  10/28/14 0756 10/28/14 1410  TROPONINI 0.25* 0.18*    Lab Results  Component Value Date   CHOL 232* 02/24/2012   HDL 40 02/24/2012   LDLCALC 124* 02/24/2012   TRIG 338* 02/24/2012   CHOLHDL 5.8 02/24/2012   Lab Results  Component Value Date   HGBA1C 6.3* 02/24/2012     Lab Results  Component Value Date   TSH 4.336 10/28/2014    Hepatic Function Panel  Recent Labs  10/27/14 1223  PROT 7.9  ALBUMIN 3.5  AST 20  ALT 22  ALKPHOS 92  BILITOT 1.4*   No results for input(s): CHOL in the last 72 hours. No results for input(s): PROTIME in the last 72 hours.     Studies/Results: Cardiac Studies: Echo 10/28/14 Study Conclusions  - Left ventricle: The cavity size was normal. Systolic function was normal. The estimated ejection fraction was in the range of 60% to 65%. Wall motion was normal; there were no regional wall motion abnormalities. Features are consistent with a pseudonormal left ventricular filling pattern, with concomitant abnormal relaxation and increased filling pressure (grade 2 diastolic dysfunction). Doppler parameters are consistent with high ventricular filling pressure. - Aortic valve: Trileaflet; mildly thickened, mildly calcified leaflets. There was mild regurgitation. - Mitral valve: There was mild regurgitation. - Left atrium: The atrium was mildly dilated. - Right  atrium: The atrium was mildly dilated. - Tricuspid valve: There was moderate regurgitation. - Pulmonary arteries: PA peak pressure: 54 mm Hg (S).  Impressions:  - The right ventricular systolic pressure was increased consistent with moderate pulmonary hypertension.  Medications: I have reviewed the patient's current medications. Scheduled Meds: . antiseptic oral rinse  7 mL Mouth Rinse BID  . aspirin EC  81 mg Oral Daily  . atorvastatin  10 mg Oral  q1800  . Chlorhexidine Gluconate Cloth  6 each Topical Q0600  . citalopram  20 mg Oral Daily  . diltiazem  120 mg Oral Daily  . furosemide  40 mg Intravenous Q12H  . heparin subcutaneous  5,000 Units Subcutaneous 3 times per day  . irbesartan  150 mg Oral Daily  . levofloxacin (LEVAQUIN) IV  250 mg Intravenous Q24H  . memantine  28 mg Oral QHS  . metoprolol tartrate  12.5 mg Oral BID  . mupirocin ointment  1 application Nasal BID  . oxybutynin  5 mg Oral QHS  . pantoprazole  40 mg Oral BID AC  . potassium chloride  20 mEq Oral BID  . sodium chloride  3 mL Intravenous Q12H  . trimethoprim  100 mg Oral QHS   Continuous Infusions:  PRN Meds:.sodium chloride, acetaminophen, ALPRAZolam, nitroGLYCERIN, ondansetron (ZOFRAN) IV, rOPINIRole, sodium chloride  Assessment/Plan: Principal Problem:   Acute CHF Active Problems:   Hypertension, at times poorly controlled   Chest pain-low risk Myoview May 2013   History of TIA (transient ischemic attack)   Memory disorder, possibly not taking home meds at times   Hyperlipidemia   Chronic back pain   Anxiety   Atrial fibrillation with RVR (new)   Troponin level elevated-suspect demand ischemia from AF with RVR   Lymphoma history   Hypokalemia   Diastolic dysfunction-grade 2   Pulmonary hypertension-moderate   Acute pulmonary edema  Acute CHF- she is negative 3449 since admit, wt 191 lb down from pk of 193. Change to po lasix today.  Was not on lasix prior to admit.  60 mg daily   PAF-CHA2DS2-VASc Score a7-  Dr Gwenlyn Found did not feel she was a candidate for anticoagulation secondary to history of falls and compliance.   She is now on VTE prophylaxis.  UTI- on antibiotics  Chest pain-low risk Myoview May 2013/ Troponin level elevated-suspect demand ischemia from AF with RVR -elevated troponin likely secondary to A. fib with RVR - no other workup recommended at this time   DNR     LOS: 3 days   Time spent with pt. :15  minutes. [facility]  Nurse Practitioner Certified Pager [phone] or after 5pm and on weekends call [phone] 10/30/2014, 8:02 AM   Agree with note written by Cecilie Kicks RNP  Clinically improved. I/O neg 3.5 L. Maintaining NSR. Ambulated with PT. Pt being Tx tele. Can prob transition to PO lasix. No OAC secondary to fall risk. Prob home over weekend.  Quay Burow 10/30/2014 12:14 PM

## 2014-10-30 NOTE — Clinical Social Work Placement (Signed)
Patient has a bed at [facility] when stable for discharge.     Raynaldo Opitz, [facility] Clinical Social Worker cell #: [phone]    CLINICAL SOCIAL WORK PLACEMENT  NOTE  Date:  10/30/2014  Patient Details  Name: Amanda Hogan MRN: [id number] Date of Birth: 1928/05/31  Clinical Social Work is seeking post-discharge placement for this patient at the [place] level of care (*CSW will initial, date and re-position this form in  chart as items are completed):  Yes   Patient/family provided with [facility] Work Department's list of facilities offering this level of care within the geographic area requested by the patient (or if unable, by the patient's family).  Yes   Patient/family informed of their freedom to choose among providers that offer the needed level of care, that participate in Medicare, Medicaid or managed care program needed by the patient, have an available bed and are willing to accept the patient.  Yes   Patient/family informed of [facility]'s ownership interest in [facility] and [facility], as well as of the fact that they are under no obligation to receive care at these facilities.  PASRR submitted to EDS on 10/30/14     PASRR number received on 10/30/14     Existing PASRR number confirmed on       FL2 transmitted to all facilities in geographic area requested by pt/family on 10/30/14     FL2 transmitted to all facilities within larger geographic area on       Patient informed that his/her managed care company has contracts with or will negotiate with certain facilities, including the following:        Yes   Patient/family informed of bed offers received.  Patient chooses bed at [facility]     Physician recommends and patient chooses bed at      Patient to be transferred to [facility] on  .  Patient to be transferred to facility by       Patient family notified on   of  transfer.  Name of family member notified:        PHYSICIAN       Additional Comment:    _______________________________________________ Standley Brooking, LCSW 10/30/2014, 4:28 PM

## 2014-10-30 NOTE — Progress Notes (Signed)
PT Cancellation Note  Patient Details Name: Amanda Hogan MRN: [id number] DOB: 14-Feb-1929   Cancelled Treatment:    Reason Eval/Treat Not Completed: Other (comment) (just OOB with nursing. check back to assist back in bed.)   Claretha Cooper 10/30/2014, 10:02 AM Tresa Endo PT [phone]

## 2014-10-30 NOTE — Evaluation (Signed)
Physical Therapy Evaluation Patient Details Name: Amanda Hogan MRN: [id number] DOB: March 03, 1929 Today's Date: 10/30/2014   History of Present Illness  Amanda Hogan is a 80 y.o. female with hypertension, hyperlipidemia, TIAs, recurrent urinary tract infections who presented to the hospital for multiple complaints including generalized body aches and shortness of breath. She was apparently treated for UTI recently while at [facility]. In the ER chest x-ray revealed vascular congestion and she then developed A. fib with RVR. She was later noted to have mildly elevated troponin.   Clinical Impression  Patient ambulated x 50'. sats dropped to 87% on RA after 25', placed on 2 l with sats  At 91%. HR 80's. Patient will benefit from PT to address problems listed in note below.     Follow Up Recommendations Home health PT;SNF;Supervision/Assistance - 24 hour (vs If has 24/7 caregivers)    Equipment Recommendations  None recommended by PT    Recommendations for Other Services       Precautions / Restrictions Precautions Precautions: Fall Precaution Comments: monitor sats and HR Restrictions Weight Bearing Restrictions: No      Mobility  Bed Mobility Overal bed mobility: Needs Assistance Bed Mobility: Supine to Sit     Supine to sit: Mod assist     General bed mobility comments: assist with trunk to upright  Transfers Overall transfer level: Needs assistance Equipment used: Rolling walker (2 wheeled) Transfers: Sit to/from Stand Sit to Stand: Mod assist         General transfer comment: Steady assist  once in standing, then min assist.  Ambulation/Gait Ambulation/Gait assistance: Min assist;+2 safety/equipment Ambulation Distance (Feet): 50 Feet Assistive device: Rolling walker (2 wheeled) Gait Pattern/deviations: Step-through pattern;Decreased stride length     General Gait Details: cues  for rest PRN, Sats dropped to87%, placed on 2 l.  Stairs             Wheelchair Mobility    Modified Rankin (Stroke Patients Only)       Balance                                             Pertinent Vitals/Pain Pain Assessment: No/denies pain    Home Living Family/patient expects to be discharged to:: Private residence Living Arrangements: Alone Available Help at Discharge: Family Type of Home: House Home Access: Stairs to enter Entrance Stairs-Rails: Right Entrance Stairs-Number of Steps: 5 Home Layout: One level Home Equipment: Environmental consultant - 2 wheels;Cane - single point;Cane - quad;Walker - 4 wheels      Prior Function Level of Independence: Independent with assistive device(s)               Hand Dominance        Extremity/Trunk Assessment   Upper Extremity Assessment: Generalized weakness           Lower Extremity Assessment: Generalized weakness      Cervical / Trunk Assessment: Normal  Communication   Communication: HOH  Cognition Arousal/Alertness: Awake/alert Behavior During Therapy: WFL for tasks assessed/performed Overall Cognitive Status: Within Functional Limits for tasks assessed                      General Comments      Exercises        Assessment/Plan    PT Assessment Patient needs continued PT services  PT Diagnosis Difficulty  walking;Generalized weakness   PT Problem List Decreased strength;Decreased activity tolerance;Decreased mobility;Cardiopulmonary status limiting activity;Decreased knowledge of precautions;Decreased safety awareness;Decreased knowledge of use of DME  PT Treatment Interventions DME instruction;Gait training;Stair training;Functional mobility training;Therapeutic activities;Therapeutic exercise;Patient/family education   PT Goals (Current goals can be found in the Care Plan section) Acute Rehab PT Goals Patient Stated Goal: agreed to walk PT Goal Formulation: With patient/family Time For Goal Achievement: 11/13/14 Potential to Achieve Goals:  Good    Frequency Min 3X/week   Barriers to discharge Decreased caregiver support      Co-evaluation               End of Session Equipment Utilized During Treatment: Gait belt Activity Tolerance: Patient tolerated treatment well Patient left: in chair;with call bell/phone within reach;with family/visitor present Nurse Communication: Mobility status         Time: 1145-1208 PT Time Calculation (min) (ACUTE ONLY): 23 min   Charges:   PT Evaluation $Initial PT Evaluation Tier I: 1 Procedure PT Treatments $Gait Training: 8-22 mins   PT G CodesClaretha Cooper 10/30/2014, 12:43 PM Tresa Endo PT [phone]'

## 2014-10-30 NOTE — Clinical Social Work Note (Signed)
Clinical Social Work Assessment  Patient Details  Name: Amanda Hogan MRN: [id number] Date of Birth: 09-25-1928  Date of referral:  10/30/14               Reason for consult:  Facility Placement                Permission sought to share information with:  Chartered certified accountant granted to share information::  Yes, Verbal Permission Granted  Name::        Agency::     Relationship::     Contact Information:     Housing/Transportation Living arrangements for the past 2 months:  Single Family Home Source of Information:  Adult Children Patient Interpreter Needed:  None Criminal Activity/Legal Involvement Pertinent to Current Situation/Hospitalization:    Significant Relationships:  Adult Children, Spouse Lives with:  Spouse Do you feel safe going back to the place where you live?  No Need for family participation in patient care:  Yes (Comment)  Care giving concerns:  CSW reviewed PT evaluation recommending SNF vs. Home with Home Health services.    Social Worker assessment / plan:  CSW spoke with patient's daughter, Onalee Hua via phone (ph#: [phone]) as patient was sleeping peacefully.   Employment status:  Retired Nurse, adult PT Recommendations:  [place] / Referral to community resources:  [place]  Patient/Family's Response to care:  Patient's daughter is agreeable with plan for SNF, would like for her to get as much rehab as she can before she returns home. Patient's daughter requested [facility] SNF as it's closest to their home in [place].   Patient/Family's Understanding of and Emotional Response to Diagnosis, Current Treatment, and Prognosis:    Emotional Assessment Appearance:  Appears stated age Attitude/Demeanor/Rapport:    Affect (typically observed):  Unable to Assess, Calm Orientation:  Oriented to Self, Oriented to Place, Oriented to  Time, Oriented to  Situation Alcohol / Substance use:    Psych involvement (Current and /or in the community):     Discharge Needs  Concerns to be addressed:    Readmission within the last 30 days:    Current discharge risk:    Barriers to Discharge:      Standley Brooking, LCSW 10/30/2014, 4:25 PM

## 2014-10-31 DIAGNOSIS — J9601 Acute respiratory failure with hypoxia: Secondary | ICD-10-CM

## 2014-10-31 LAB — BASIC METABOLIC PANEL
Anion gap: 9 (ref 5–15)
BUN: 32 mg/dL — AB (ref 6–20)
CALCIUM: 8.8 mg/dL — AB (ref 8.9–10.3)
CO2: 30 mmol/L (ref 22–32)
CREATININE: 1.9 mg/dL — AB (ref 0.44–1.00)
Chloride: 95 mmol/L — ABNORMAL LOW (ref 101–111)
GFR calc Af Amer: 27 mL/min — ABNORMAL LOW (ref >60)
GFR, EST NON AFRICAN AMERICAN: 23 mL/min — AB (ref >60)
GLUCOSE: 119 mg/dL — AB (ref 65–99)
POTASSIUM: 4.3 mmol/L (ref 3.5–5.1)
Sodium: 134 mmol/L — ABNORMAL LOW (ref 135–145)

## 2014-10-31 MED ORDER — CLOPIDOGREL BISULFATE 75 MG PO TABS
75.0000 mg | ORAL_TABLET | Freq: Every day | ORAL | Status: DC
  Administered 2014-11-01: 75 mg via ORAL
  Filled 2014-10-31: qty 1

## 2014-10-31 MED ORDER — FUROSEMIDE 40 MG PO TABS
40.0000 mg | ORAL_TABLET | Freq: Every day | ORAL | Status: DC
  Administered 2014-11-01: 40 mg via ORAL
  Filled 2014-10-31: qty 1

## 2014-10-31 NOTE — Progress Notes (Signed)
Amanda Hogan is a 81 y.o. female with hypertension, hyperlipidemia, TIAs, recurrent urinary tract infections who presented to the hospital for multiple complaints including generalized body aches and shortness of breath. She was apparently treated for UTI recently while at [facility]. In the ER chest x-ray revealed vascular congestion and she then developed A. fib with RVR. She was later noted to have mildly elevated troponin. She converted spontaneously to NSR. She was also noted to have a temperature of 100.6, mildly elevated WBC count at 11 and a UA which was suggestive of a UTI.   Subjective: No chest pain, no SOB is weak with ambulation.  Her sp02 dropped to 85% on room air with ambulation today, she is going to SNF with oxygen.   Objective: Vital signs in last 24 hours: Temp:  [97.7 F (36.5 C)-98.1 F (36.7 C)] 98.1 F (36.7 C) (08/11 2109) Pulse Rate:  [60-78] 70 (08/12 0918) Resp:  [18-25] 20 (08/11 2109) BP: (99-120)/(40-65) 99/47 mmHg (08/12 0918) SpO2:  [82 %-98 %] 82 % (08/12 0900) Weight:  [195 lb 15.8 oz (88.9 kg)] 195 lb 15.8 oz (88.9 kg) (08/12 0653) Weight change: 4 lb 10.1 oz (2.1 kg) Last BM Date: 10/30/14 Intake/Output from previous day: -747  (since admit -4321) wt up to 195 from 191 yesterday but different scales.     08/11 0701 - 08/12 0700 In: 40 [P.O.:600; I.V.:3] Out: 1475 [Urine:1475] Intake/Output this shift: Total I/O In: 240 [P.O.:240] Out: 200 [Urine:200]  PE: General:Pleasant affect, NAD Skin:Warm and dry, brisk capillary refill HEENT:normocephalic, sclera clear, mucus membranes moist Heart:S1S2 RRR with soft systolic murmur, no gallup, rub or click Lungs: with fine crackles in bases, no rhonchi, or wheezes ZOX:WRUE, non tender, + BS, do not palpate liver spleen or masses Ext:no lower ext edema, 2+ pedal pulses, 2+ radial pulses Neuro:alert and oriented, MAE, follows commands, + facial symmetry Tele: SR  Lab Results: No  results for input(s): WBC, HGB, HCT, PLT in the last 72 hours. BMET  Recent Labs  10/30/14 0340 10/31/14 0345  NA 135 134*  K 4.2 4.3  CL 98* 95*  CO2 28 30  GLUCOSE 106* 119*  BUN 27* 32*  CREATININE 1.42* 1.90*  CALCIUM 8.5* 8.8*    Recent Labs  10/28/14 1410  TROPONINI 0.18*    Lab Results  Component Value Date   CHOL 232* 02/24/2012   HDL 40 02/24/2012   LDLCALC 124* 02/24/2012   TRIG 338* 02/24/2012   CHOLHDL 5.8 02/24/2012   Lab Results  Component Value Date   HGBA1C 6.3* 02/24/2012     Lab Results  Component Value Date   TSH 4.336 10/28/2014     Studies/Results: Dg Chest Port 1 View  10/30/2014   CLINICAL DATA:  Pulmonary edema.  EXAM: PORTABLE CHEST - 1 VIEW  COMPARISON:  10/27/2014  FINDINGS: The cardiac silhouette remains enlarged. Thoracic aortic calcification is again seen. There are mild interstitial densities bilaterally, improved from the prior study. There is improved aeration of the right lung base. Mild left basilar opacity is present and likely reflects atelectasis. There may be a trace left pleural effusion. No pneumothorax is seen.  IMPRESSION: 1. Mild pulmonary edema, improved from prior. 2. Left basilar atelectasis.   Electronically Signed   By: Logan Bores   On: 10/30/2014 11:14    Medications: I have reviewed the patient's current medications. Scheduled Meds: . antiseptic oral rinse  7 mL Mouth Rinse BID  .  aspirin EC  81 mg Oral Daily  . atorvastatin  10 mg Oral q1800  . Chlorhexidine Gluconate Cloth  6 each Topical Q0600  . citalopram  20 mg Oral Daily  . diltiazem  120 mg Oral Daily  . furosemide  40 mg Oral BID  . heparin subcutaneous  5,000 Units Subcutaneous 3 times per day  . irbesartan  150 mg Oral Daily  . memantine  28 mg Oral QHS  . metoprolol tartrate  12.5 mg Oral BID  . mupirocin ointment  1 application Nasal BID  . oxybutynin  5 mg Oral QHS  . pantoprazole  40 mg Oral BID AC  . potassium chloride  20 mEq Oral BID    . sodium chloride  3 mL Intravenous Q12H  . trimethoprim  100 mg Oral QHS   Continuous Infusions:  PRN Meds:.sodium chloride, acetaminophen, ALPRAZolam, menthol-cetylpyridinium, nitroGLYCERIN, ondansetron (ZOFRAN) IV, rOPINIRole, sodium chloride  Assessment/Plan: Principal Problem:   Acute diastolic CHF (congestive heart failure) Active Problems:   Hypertension, at times poorly controlled   Chest pain-low risk Myoview May 2013   History of TIA (transient ischemic attack)   Memory disorder, possibly not taking home meds at times   Hyperlipidemia   Chronic back pain   Anxiety   Atrial fibrillation with RVR (new)   Troponin level elevated-suspect demand ischemia from AF with RVR   Lymphoma history   Hypokalemia   Diastolic dysfunction-grade 2   Pulmonary hypertension-moderate  Acute CHF- she is negative 4321 since admit. Changed to po lasix 40 mg BID today. Was not on lasix prior to admit. Cr. Now 1.90, was 0.96 on admit.  Dr. Gwenlyn Found to decide diuretic dose hold lasix for a day then resume  At once a day.   AVAPRO is new for pt and her amlodipine was changed to cardizem.  Stop avapro  Chest x-ray had improved from yesterday though still some pul. Edema.   HYPOTENSION to 99/47 this AM metoprolol and dilt held.  ( placed on these for a fib now in SR)  -Dr. Gwenlyn Found to address ? Decrease or stop avapro hold lasix for 24 hours and resume at 40 daily?    PAF-CHA2DS2-VASc Score a7- Dr Gwenlyn Found did not feel she was a candidate for anticoagulation secondary to history of falls and compliance. She is now on VTE prophylaxis.  UTI- on antibiotics  Chest pain-low risk Myoview May 2013/ Troponin level elevated-suspect demand ischemia from AF with RVR -elevated troponin likely secondary to A. fib with RVR - no other workup recommended at this time   Hypoxia with ambulation to be discharged with oxygen.  Hopefully with increased activity this will improve.  DNR ? Discharge tomorrow vs.  Tomorrow Needs BMET next week Follow up with APP in 2-3 weeks with cards.    LOS: 4 days   Time spent with pt. :15 minutes. [facility] R  Nurse Practitioner Certified Pager [phone] or after 5pm and on weekends call [phone] 10/31/2014, 11:11 AM   Agree with note written by Cecilie Kicks RNP  Maintaining NSR. Afeb. SCr increased. Will hold lasix today and start back tomorrow once a day. D/C avapro. Ambulate today with assistance. Prob can be D/Cd to [facility] and we will follow up as an OP.  Quay Burow 10/31/2014 12:13 PM

## 2014-10-31 NOTE — Discharge Summary (Addendum)
Physician Discharge Summary  Amanda Hogan RXV:[id number] DOB: 1929-01-28 DOA: 10/27/2014  PCP: Delia Chimes, NP  Admit date: 10/27/2014 Discharge date: 11/01/2014  Time spent: 55 minutes  Recommendations for Outpatient Follow-up:  1. Consider starting NOAC if patient no longer a fall risk after Rehab 2. Weight daily and adjust Lasix dose as needed 3. Check BP BID- hold Metoprolol if SBP < 100- Hold both Metoprolol and Cardizem if SBP <90 4. Bmet in 4 days (8/17) - resume Losartan if Cr improves- follow Cr if Losartan resumed 5. Wean O2 as able  Discharge Condition: stable Diet recommendation: low sodium, heart healthy Code Status: DNR  Discharge Diagnoses:  Principal Problem:   Acute diastolic CHF (congestive heart failure) Active Problems:   Hypertension, at times poorly controlled   Chest pain-low risk Myoview May 2013   Hyperlipidemia   History of TIA (transient ischemic attack)   Memory disorder, possibly not taking home meds at times   Chronic back pain   Anxiety   Atrial fibrillation with RVR (new)   Troponin level elevated-suspect demand ischemia from AF with RVR   Lymphoma history   Hypokalemia   Diastolic dysfunction-grade 2   Pulmonary hypertension-moderate   History of present illness:  Amanda Hogan is a 81 y.o. female with hypertension, hyperlipidemia, TIAs, recurrent urinary tract infections who presented to the hospital for multiple complaints including generalized body aches and shortness of breath. She was apparently treated for UTI recently while at [facility]. In the ER chest x-ray revealed vascular congestion and she then developed A. fib with RVR. She was later noted to have mildly elevated troponin. A cardiology consult was requested.  She was also noted to have a temperature of 100.6, mildly elevated WBC count at 11 and a UA which was suggestive of a UTI.  Hospital Course:  Principal Problem:  Acute CHF- diastolic/ Pulmonary  hypertension-moderate -See echo report below - On Lasix and Metoprolol  - ARB started but held on 8/12 due to rise in Cr- Cr up from 0.96 to 1.9 - in negative balance by 3900 cc-  - lungs clear bilaterally on exam, no JVD but still requiring 2 L O2 - Have started incentive spirometry -Cardiology following and assisting with management as well  Active Problems:  ARF - Cr rose yesterday from 1.4 to 1.9- Cardiology cut back Lasix from 40 BID to daily and stopped Losartan  Acute resp failure - likely due to above pulm edema and underlying atelectasis- see last CXR report below- cont Incentive spirometry at facility  Atrial fibrillation with RVR - CHA2DS2-VASc Score is 7 -converted to NSR early in the admission and therefore transitioned from Cardizem infusion to oral Cardizem-also on Toprol at home -Heparin infusion discontinued by cardiology-Dr. Gwenlyn Found feels that she is not a good candidate for long-term anticoagulation due to dementia and frequent falls - as she will go to rehab and then home with a 24 hr caretaker who will administer her medications, if amount of falls have decreased, she can be anticoagulated in my opinion- to be noted, she is on ASA and Plavix at home which I have continued   Hypotension with h/o Hypertension  -Takes amlodipine and Aldactone at home which are on hold at this time -Continue Lopressor, diltiazem and Lasix- cut back Toprol to 12.5 mg daily - stopped Losartan (started this admission) due to rise in Cr   Hypokalemia - replaced- keep > 4.0  AKI on CKD 3 - as mentioned, Cr was 0.9 on admission  and has risen due to diuretics-   Non complicated UTI/sepsis (Fever & leukocytosis) - Culture reveals yeast greater than 100,000 colonies (do not treat) -blood cultures negative - Was discharged on Levaquin on 8/5 from a hospital in [facility]- I suspect she had 2-3 days of it while she was in the hospital -has received levofloxacin x 4 more days in house and  therefore has received sufficient treatment  Frequent UTIs -Continue trimethoprim which she takes daily   Chest pain-low risk Myoview May 2013/ Troponin level elevated-suspect demand ischemia from AF with RVR -elevated troponin likely secondary to A. fib with RVR - no other workup recommended at this time by cardiology   Hyperlipidemia -Continue statin   History of TIA (transient ischemic attack) -On aspirin and Plavix at home  Dementia -Continue Namenda -concern that she is noncompliant with medications at home -Family arranging 24 hour caretaker for her home after she is released from SNF   Anxiety -Takes when necessary Xanax at home as stated in medication reconciliation -have discontinued it- no c/o anxiety- avoid benzodiazepines if possibly  Urinary incontinence -Continue Detrol   Procedures:  ECHO Procedures: 2-D echo Left ventricle: The cavity size was normal. Systolic function was normal. The estimated ejection fraction was in the range of 60% to 65%. Wall motion was normal; there were no regional wall motion abnormalities. Features are consistent with a pseudonormal left ventricular filling pattern, with concomitant abnormal relaxation and increased filling pressure (grade 2 diastolic dysfunction). Doppler parameters are consistent with high ventricular filling pressure. - Aortic valve: Trileaflet; mildly thickened, mildly calcified leaflets. There was mild regurgitation. - Mitral valve: There was mild regurgitation. - Left atrium: The atrium was mildly dilated. - Right atrium: The atrium was mildly dilated. - Tricuspid valve: There was moderate regurgitation. - Pulmonary arteries: PA peak pressure: 54 mm Hg (S).   Consultations:  Cardiology  Discharge Exam: Filed Weights   10/30/14 0532 10/31/14 0653 11/01/14 6256  Weight: 86.8 kg (191 lb 5.8 oz) 88.9 kg (195 lb 15.8 oz) 88.1 kg (194 lb 3.6 oz)   Filed Vitals:   11/01/14 0916   BP: 113/53  Pulse: 76  Temp:   Resp:     General: AAO x 3, no distress-  hard of hearing Cardiovascular: RRR, no murmurs  Respiratory: clear to auscultation bilaterally- 97% on 2 L O2 GI: soft, non-tender, non-distended, bowel sound positive  Discharge Instructions You were cared for by a hospitalist during your hospital stay. If you have any questions about your discharge medications or the care you received while you were in the hospital after you are discharged, you can call the unit and asked to speak with the hospitalist on call if the hospitalist that took care of you is not available. Once you are discharged, your primary care physician will handle any further medical issues. Please note that NO REFILLS for any discharge medications will be authorized once you are discharged, as it is imperative that you return to your primary care physician (or establish a relationship with a primary care physician if you do not have one) for your aftercare needs so that they can reassess your need for medications and monitor your lab values.      Discharge Instructions    Diet - low sodium heart healthy    Complete by:  As directed      Increase activity slowly    Complete by:  As directed  Medication List    STOP taking these medications        ALPRAZolam 0.25 MG tablet  Commonly known as:  XANAX     amLODipine 10 MG tablet  Commonly known as:  NORVASC     levofloxacin 500 MG tablet  Commonly known as:  LEVAQUIN     spironolactone 25 MG tablet  Commonly known as:  ALDACTONE      TAKE these medications        acetaminophen 500 MG tablet  Commonly known as:  TYLENOL  Take 1,000 mg by mouth every 6 (six) hours as needed. For pain     aspirin 81 MG tablet  Take 1 tablet (81 mg total) by mouth daily.     celecoxib 200 MG capsule  Commonly known as:  CELEBREX  Take 200 mg by mouth daily.     citalopram 20 MG tablet  Commonly known as:  CELEXA  Take 1 tablet  (20 mg total) by mouth daily.     clopidogrel 75 MG tablet  Commonly known as:  PLAVIX  Take 75 mg by mouth daily.     diltiazem 120 MG 24 hr capsule  Commonly known as:  CARDIZEM CD  Take 1 capsule (120 mg total) by mouth daily.     furosemide 40 MG tablet  Commonly known as:  LASIX  Take 1 tablet (40 mg total) by mouth daily.     gabapentin 300 MG capsule  Commonly known as:  NEURONTIN  Take 300 mg by mouth at bedtime.     metoprolol succinate 25 MG 24 hr tablet  Commonly known as:  TOPROL-XL  Take 0.5 tablets (12.5 mg total) by mouth daily.     NAMENDA XR 28 MG Cp24 24 hr capsule  Generic drug:  memantine  Take 28 mg by mouth daily.     nitroGLYCERIN 0.4 MG SL tablet  Commonly known as:  NITROSTAT  Place 0.4 mg under the tongue every 5 (five) minutes as needed. For chest pain     pantoprazole 40 MG tablet  Commonly known as:  PROTONIX  Take 1 tablet (40 mg total) by mouth 2 (two) times daily before a meal.     PROBIOTIC PO  Take 1 tablet by mouth daily with lunch.     rOPINIRole 2 MG tablet  Commonly known as:  REQUIP  Take 2 mg by mouth 3 (three) times daily as needed (restless legs).     rosuvastatin 20 MG tablet  Commonly known as:  CRESTOR  Take 20 mg by mouth daily.     tolterodine 2 MG tablet  Commonly known as:  DETROL  Take 4 mg by mouth 2 (two) times daily.     triamcinolone cream 0.1 %  Commonly known as:  KENALOG  Apply 1 application topically 2 (two) times daily.     trimethoprim 100 MG tablet  Commonly known as:  TRIMPEX  Take 100 mg by mouth at bedtime.       Allergies  Allergen Reactions  . Contrast Media [Iodinated Diagnostic Agents]   . Iohexol      Desc: ITCHING/RESPIRATORY DISTRESS    Follow-up Information    Follow up with Delia Chimes, NP. Go in 1 week.   Specialty:  Nurse Practitioner   Why:  AT POST HOSP. VISIT, AT 10 AM ON 11/06/2014, , CO-PAY, ARRIVE 15 PRIOR TO APPT, LIST OF MEDS, BRING INSURANCE CARD AND ID   Contact  information:   [address] [place] [address] [phone]       Follow up with Leonie Man, MD On 11/20/2014.   Specialty:  Cardiology   Why:  at 11:00AM   Contact information:   [address] [place] [address] [phone]        The results of significant diagnostics from this hospitalization (including imaging, microbiology, ancillary and laboratory) are listed below for reference.    Significant Diagnostic Studies: Dg Chest Port 1 View  10/30/2014   CLINICAL DATA:  Pulmonary edema.  EXAM: PORTABLE CHEST - 1 VIEW  COMPARISON:  10/27/2014  FINDINGS: The cardiac silhouette remains enlarged. Thoracic aortic calcification is again seen. There are mild interstitial densities bilaterally, improved from the prior study. There is improved aeration of the right lung base. Mild left basilar opacity is present and likely reflects atelectasis. There may be a trace left pleural effusion. No pneumothorax is seen.  IMPRESSION: 1. Mild pulmonary edema, improved from prior. 2. Left basilar atelectasis.   Electronically Signed   By: Logan Bores   On: 10/30/2014 11:14   Dg Chest Port 1 View  10/27/2014   CLINICAL DATA:  Shortness of breath and lethargy  EXAM: PORTABLE CHEST - 1 VIEW  COMPARISON:  11/30/2012  FINDINGS: There is moderate cardiac enlargement. Aortic atherosclerosis identified. There is mild diffuse interstitial edema identified. Atelectasis noted in the right lung base.  IMPRESSION: 1. Cardiac enlargement and mild pulmonary edema. 2. Right base atelectasis. 3. Aortic atherosclerosis.   Electronically Signed   By: Kerby Moors M.D.   On: 10/27/2014 14:40    Microbiology: Recent Results (from the past 240 hour(s))  Culture, blood (routine x 2)     Status: None (Preliminary result)   Collection Time: 10/27/14 12:20 PM  Result Value Ref Range Status   Specimen Description BLOOD LEFT ANTECUBITAL  Final   Special Requests BOTTLES DRAWN AEROBIC AND  ANAEROBIC 5CC  Final   Culture   Final    NO GROWTH 4 DAYS Performed at [facility]    Report Status PENDING  Incomplete  Culture, blood (routine x 2)     Status: None (Preliminary result)   Collection Time: 10/27/14 12:38 PM  Result Value Ref Range Status   Specimen Description BLOOD RIGHT HAND  Final   Special Requests BOTTLES DRAWN AEROBIC AND ANAEROBIC 5ML  Final   Culture   Final    NO GROWTH 4 DAYS Performed at [facility]    Report Status PENDING  Incomplete  Urine culture     Status: None   Collection Time: 10/27/14  2:51 PM  Result Value Ref Range Status   Specimen Description URINE, CATHETERIZED  Final   Special Requests NONE  Final   Culture   Final    >=100,000 COLONIES/mL YEAST Performed at [facility]    Report Status 10/29/2014 FINAL  Final  MRSA PCR Screening     Status: Abnormal   Collection Time: 10/28/14  1:44 PM  Result Value Ref Range Status   MRSA by PCR POSITIVE (A) NEGATIVE Final    Comment:        The GeneXpert MRSA Assay (FDA approved for NASAL specimens only), is one component of a comprehensive MRSA colonization surveillance program. It is not intended to diagnose MRSA infection nor to guide or monitor treatment for MRSA infections. RESULT CALLED TO, READ BACK BY AND VERIFIED WITH: STROUP,K. RN @1557  ON 8.9.16 BY MCCOY,N.      Labs: Basic  Metabolic Panel:  Recent Labs Lab 10/28/14 0440 10/28/14 1151 10/29/14 0058 10/30/14 0340 10/31/14 0345 11/01/14 0350  NA 136  --  134* 135 134* 137  K 3.0*  --  3.3* 4.2 4.3 4.5  CL 99*  --  98* 98* 95* 100*  CO2 26  --  27 28 30 28   GLUCOSE 108*  --  104* 106* 119* 117*  BUN 22*  --  22* 27* 32* 33*  CREATININE 1.06*  --  1.10* 1.42* 1.90* 1.73*  CALCIUM 8.4*  --  8.3* 8.5* 8.8* 8.9  MG  --  1.8  --   --   --   --    Liver Function Tests:  Recent Labs Lab 10/27/14 1223  AST 20  ALT 22  ALKPHOS 92  BILITOT 1.4*  PROT 7.9  ALBUMIN 3.5   No results for  input(s): LIPASE, AMYLASE in the last 168 hours. No results for input(s): AMMONIA in the last 168 hours. CBC:  Recent Labs Lab 10/27/14 1221 10/28/14 0208  WBC 11.0* 9.3  NEUTROABS 8.7*  --   HGB 9.8* 10.1*  HCT 30.4* 30.6*  MCV 94.1 92.4  PLT 306 273   Cardiac Enzymes:  Recent Labs Lab 10/27/14 1221 10/28/14 0208 10/28/14 0756 10/28/14 1410  TROPONINI 0.25* 0.30* 0.25* 0.18*   BNP: BNP (last 3 results)  Recent Labs  10/28/14 0440 10/30/14 0340  BNP 715.8* 603.2*    ProBNP (last 3 results) No results for input(s): PROBNP in the last 8760 hours.  CBG: No results for input(s): GLUCAP in the last 168 hours.     SignedDebbe Odea, MD [facility] 11/01/2014, 9:43 AM

## 2014-10-31 NOTE — Clinical Social Work Placement (Addendum)
Discharge cancelled for today, but patient is set to discharge to [facility] when stable, anticipating possible discharge tomorrow (Sat, 8/13). Patient & daughter, Onalee Hua aware. [employer] authorization is obtained (#: [id number]).   If ready over the weekend, please contact weekend CSW, Rollene Fare (ph#: [phone]) to facilitate discharge.     Raynaldo Opitz, [facility] Clinical Social Worker cell #: [phone]    CLINICAL SOCIAL WORK PLACEMENT  NOTE  Date:  10/31/2014  Patient Details  Name: Amanda Hogan MRN: [id number] Date of Birth: March 26, 1928  Clinical Social Work is seeking post-discharge placement for this patient at the [place] level of care (*CSW will initial, date and re-position this form in  chart as items are completed):  Yes   Patient/family provided with [place] Work Department's list of facilities offering this level of care within the geographic area requested by the patient (or if unable, by the patient's family).  Yes   Patient/family informed of their freedom to choose among providers that offer the needed level of care, that participate in Medicare, Medicaid or managed care program needed by the patient, have an available bed and are willing to accept the patient.  Yes   Patient/family informed of [place]'s ownership interest in [facility] and [facility], as well as of the fact that they are under no obligation to receive care at these facilities.  PASRR submitted to EDS on 10/30/14     PASRR number received on 10/30/14     Existing PASRR number confirmed on       FL2 transmitted to all facilities in geographic area requested by pt/family on 10/30/14     FL2 transmitted to all facilities within larger geographic area on       Patient informed that his/her managed care company has contracts with or will negotiate with certain facilities, including the following:         Yes   Patient/family informed of bed offers received.  Patient chooses bed at [facility]     Physician recommends and patient chooses bed at      Patient to be transferred to [facility] on 10/31/14.  Patient to be transferred to facility by [facility]     Patient family notified on 10/31/14 of transfer.  Name of family member notified:  patient's daughter, Onalee Hua via phone     PHYSICIAN       Additional Comment:    _______________________________________________ Standley Brooking, LCSW 10/31/2014, 11:27 AM

## 2014-10-31 NOTE — Progress Notes (Signed)
Patient 98% on 2LNC. After walking 23ft on room air, O2 sat dropped to 85%. Patient denied SOB and breathing did not appear labored. Patient recovered to 98% once sitting and 2LNC back on.

## 2014-10-31 NOTE — Progress Notes (Signed)
[facility] Progress Note   ZIARA THELANDER UJW:[id number] DOB: [birth year] DOA: 10/27/2014 PCP: Delia Chimes, NP  Brief narrative: Amanda Hogan is a 81 y.o. female with hypertension, hyperlipidemia, TIAs, recurrent urinary tract infections who presented to the hospital for multiple complaints including generalized body aches and shortness of breath. She was apparently treated for UTI recently while at [facility] In the ER chest x-ray revealed vascular congestion and she then developed A. fib with RVR. She was later noted to have mildly elevated troponin. A cardiology consult was requested.   She was also noted to have a temperature of 100.6, mildly elevated WBC count at 11 and a UA which was suggestive of a UTI.   Subjective: She has no complaints of shortness of breath cough chest pain or palpitations.  Assessment/Plan: Principal Problem:   Acute CHF- diastolic/ Pulmonary hypertension-moderate -See echo report below -Lasix and losartan held today due to hypotension and rise in creatinine - Lungs clear on exam today-still requiring 2 L to keep oxygen saturation greater than 88% --Continue incentive spirometry -Cardiology following and assisting with management   Active Problems: Atrial fibrillation with RVR - This patients CHA2DS2-VASc Score a7 -Transitioned from Cardizem infusion to oral Cardizem-also on metoprolol  -Heparin infusion discontinued by cardiology-Dr. Gwenlyn Found feels that she is not a good candidate for long-term anticoagulation due to dementia and frequent falls    Hypokalemia - replaced-  keep > 4.0- hold potassium while holding Lasix  AKI on CKD 3 - Secondary to diuretics  Uncomplicated UTI/sepsis (Fever & leukocytosis) - Culture reveals yeast greater than 100,000 colonies-blood cultures negative - Was discharged on Levaquin on 8/5 from a hospital in [facility]- I suspect she had 2-3 days of it while she was in the hospital -has received levofloxacin x  4 days   Frequent UTIs -Continue trimethoprim which she takes daily    Hypertension  -Takes amlodipine and Aldactone at home which are on hold at this time -Continue ARB, Lopressor, diltiazem    Chest pain-low risk Myoview May 2013/  Troponin level elevated-suspect demand ischemia from AF with RVR -elevated troponin likely secondary to A. fib with RVR - no other workup recommended at this time by cardiology    Hyperlipidemia -Continue Lipitor    History of TIA (transient ischemic attack) -On aspirin and Plavix at home    Memory disorder, possibly not taking home meds at times-  -Continue Namenda -concern that she is noncompliant with medications at home -Family has arranged 24 hour caretaker for her home    Anxiety -Takes when necessary Xanax at home a statin medication reconciliation -is not on it here-continue to follow-did not complain of anxiety this morning  Urinary incontinence -Continue Detrol     Appt with PCP: requested Code Status: DNR Family Communication:  Disposition Plan: Home Health PT-per daughter, due to dementia, the patient will have someone with her 24 hours at least for the next week and arrangements are being made so she has someone with her after that as well DVT prophylaxis: Heparin infusion Consultants:Cardiology Procedures: 2-D echo Left ventricle: The cavity size was normal. Systolic function was normal. The estimated ejection fraction was in the range of 60% to 65%. Wall motion was normal; there were no regional wall motion abnormalities. Features are consistent with a pseudonormal left ventricular filling pattern, with concomitant abnormal relaxation and increased filling pressure (grade 2 diastolic dysfunction). Doppler parameters are consistent with high ventricular filling pressure. - Aortic valve: Trileaflet; mildly thickened, mildly calcified leaflets.  There was mild regurgitation. - Mitral valve: There was mild  regurgitation. - Left atrium: The atrium was mildly dilated. - Right atrium: The atrium was mildly dilated. - Tricuspid valve: There was moderate regurgitation. - Pulmonary arteries: PA peak pressure: 54 mm Hg (S).  Antibiotics: Anti-infectives    Start     Dose/Rate Route Frequency Ordered Stop   10/29/14 1600  Levofloxacin (LEVAQUIN) IVPB 250 mg  Status:  Discontinued     250 mg 50 mL/hr over 60 Minutes Intravenous Every 24 hours 10/28/14 1529 10/30/14 1731   10/28/14 1600  levofloxacin (LEVAQUIN) IVPB 500 mg  Status:  Discontinued     500 mg 100 mL/hr over 60 Minutes Intravenous Every 24 hours 10/27/14 1629 10/28/14 1529   10/28/14 0145  trimethoprim (TRIMPEX) tablet 100 mg     100 mg Oral Daily at bedtime 10/28/14 0153     10/27/14 1630  levofloxacin (LEVAQUIN) IVPB 500 mg     500 mg 100 mL/hr over 60 Minutes Intravenous NOW 10/27/14 1629 10/27/14 1943      Objective: Filed Weights   10/29/14 0400 10/30/14 0532 10/31/14 0653  Weight: 86.7 kg (191 lb 2.2 oz) 86.8 kg (191 lb 5.8 oz) 88.9 kg (195 lb 15.8 oz)    Intake/Output Summary (Last 24 hours) at 10/31/14 1305 Last data filed at 10/31/14 1044  Gross per 24 hour  Intake    606 ml  Output   1025 ml  Net   -419 ml     Vitals Filed Vitals:   10/31/14 0915 10/31/14 0917 10/31/14 0918 10/31/14 1144  BP: 99/47 99/47 99/47  125/53  Pulse: 70  70 74  Temp:      TempSrc:      Resp:      Height:      Weight:      SpO2:        Exam:  General:  Pt is alert, not in acute distress  HEENT: No icterus, No thrush, oral mucosa moist  Cardiovascular: regular rate and rhythm, S1/S2 - 2/6 murmur at right upper sternal border  Respiratory: clear to auscultation bilaterally   Abdomen: Soft, +Bowel sounds, non tender, non distended, no guarding  MSK: No LE edema, cyanosis or clubbing  Data Reviewed: Basic Metabolic Panel:  Recent Labs Lab 10/27/14 1223 10/28/14 0208 10/28/14 0440 10/28/14 1151 10/29/14 0058  10/30/14 0340 10/31/14 0345  NA 136  --  136  --  134* 135 134*  K 3.5  --  3.0*  --  3.3* 4.2 4.3  CL 100*  --  99*  --  98* 98* 95*  CO2 28  --  26  --  27 28 30   GLUCOSE 128*  --  108*  --  104* 106* 119*  BUN 20  --  22*  --  22* 27* 32*  CREATININE 0.96 1.07* 1.06*  --  1.10* 1.42* 1.90*  CALCIUM 9.0  --  8.4*  --  8.3* 8.5* 8.8*  MG  --   --   --  1.8  --   --   --    Liver Function Tests:  Recent Labs Lab 10/27/14 1223  AST 20  ALT 22  ALKPHOS 92  BILITOT 1.4*  PROT 7.9  ALBUMIN 3.5   No results for input(s): LIPASE, AMYLASE in the last 168 hours. No results for input(s): AMMONIA in the last 168 hours. CBC:  Recent Labs Lab 10/27/14 1221 10/28/14 0208  WBC 11.0* 9.3  NEUTROABS 8.7*  --  HGB 9.8* 10.1*  HCT 30.4* 30.6*  MCV 94.1 92.4  PLT 306 273   Cardiac Enzymes:  Recent Labs Lab 10/27/14 1221 10/28/14 0208 10/28/14 0756 10/28/14 1410  TROPONINI 0.25* 0.30* 0.25* 0.18*   BNP (last 3 results)  Recent Labs  10/28/14 0440 10/30/14 0340  BNP 715.8* 603.2*    ProBNP (last 3 results) No results for input(s): PROBNP in the last 8760 hours.  CBG: No results for input(s): GLUCAP in the last 168 hours.  Recent Results (from the past 240 hour(s))  Culture, blood (routine x 2)     Status: None (Preliminary result)   Collection Time: 10/27/14 12:20 PM  Result Value Ref Range Status   Specimen Description BLOOD LEFT ANTECUBITAL  Final   Special Requests BOTTLES DRAWN AEROBIC AND ANAEROBIC 5CC  Final   Culture   Final    NO GROWTH 3 DAYS Performed at [facility]    Report Status PENDING  Incomplete  Culture, blood (routine x 2)     Status: None (Preliminary result)   Collection Time: 10/27/14 12:38 PM  Result Value Ref Range Status   Specimen Description BLOOD RIGHT HAND  Final   Special Requests BOTTLES DRAWN AEROBIC AND ANAEROBIC 5ML  Final   Culture   Final    NO GROWTH 3 DAYS Performed at [facility]    Report Status  PENDING  Incomplete  Urine culture     Status: None   Collection Time: 10/27/14  2:51 PM  Result Value Ref Range Status   Specimen Description URINE, CATHETERIZED  Final   Special Requests NONE  Final   Culture   Final    >=100,000 COLONIES/mL YEAST Performed at [facility]    Report Status 10/29/2014 FINAL  Final  MRSA PCR Screening     Status: Abnormal   Collection Time: 10/28/14  1:44 PM  Result Value Ref Range Status   MRSA by PCR POSITIVE (A) NEGATIVE Final    Comment:        The GeneXpert MRSA Assay (FDA approved for NASAL specimens only), is one component of a comprehensive MRSA colonization surveillance program. It is not intended to diagnose MRSA infection nor to guide or monitor treatment for MRSA infections. RESULT CALLED TO, READ BACK BY AND VERIFIED WITH: STROUP,K. RN @1557  ON 8.9.16 BY MCCOY,N.      Studies: Dg Chest Port 1 View  10/30/2014   CLINICAL DATA:  Pulmonary edema.  EXAM: PORTABLE CHEST - 1 VIEW  COMPARISON:  10/27/2014  FINDINGS: The cardiac silhouette remains enlarged. Thoracic aortic calcification is again seen. There are mild interstitial densities bilaterally, improved from the prior study. There is improved aeration of the right lung base. Mild left basilar opacity is present and likely reflects atelectasis. There may be a trace left pleural effusion. No pneumothorax is seen.  IMPRESSION: 1. Mild pulmonary edema, improved from prior. 2. Left basilar atelectasis.   Electronically Signed   By: Logan Bores   On: 10/30/2014 11:14    Scheduled Meds:  Scheduled Meds: . antiseptic oral rinse  7 mL Mouth Rinse BID  . aspirin EC  81 mg Oral Daily  . atorvastatin  10 mg Oral q1800  . Chlorhexidine Gluconate Cloth  6 each Topical Q0600  . citalopram  20 mg Oral Daily  . diltiazem  120 mg Oral Daily  . [START ON 11/01/2014] furosemide  40 mg Oral Daily  . heparin subcutaneous  5,000 Units Subcutaneous 3 times per day  .  memantine  28 mg Oral QHS   . metoprolol tartrate  12.5 mg Oral BID  . mupirocin ointment  1 application Nasal BID  . oxybutynin  5 mg Oral QHS  . pantoprazole  40 mg Oral BID AC  . potassium chloride  20 mEq Oral BID  . sodium chloride  3 mL Intravenous Q12H  . trimethoprim  100 mg Oral QHS   Continuous Infusions:    Time spent on care of this patient: 35 min   Mar[place], MD 10/31/2014, 1:05 PM  LOS: 4 days   [facility] Office  [phone] Pager - Text Page per [email or web address] If 7PM-7AM, please contact night-coverage [email or web address]

## 2014-10-31 NOTE — Progress Notes (Signed)
Physical Therapy Treatment Patient Details Name: Amanda Hogan MRN: [id number] DOB: 12-03-1928 Today's Date: 10/31/2014    History of Present Illness Amanda Hogan is a 81 y.o. female with hypertension, hyperlipidemia, TIAs, recurrent urinary tract infections who presented to the hospital for multiple complaints including generalized body aches and shortness of breath. She was apparently treated for UTI recently while at [facility]. In the ER chest x-ray revealed vascular congestion and she then developed A. fib with RVR. She was later noted to have mildly elevated troponin.     PT Comments    Pt tolerated short distance ambulation on 2L O2 [place].  Pt reports weakness in lower extremities and fatigue limiting distance.  Continue to recommend SNF and 24/7 care.  Follow Up Recommendations  SNF;Supervision/Assistance - 24 hour     Equipment Recommendations  None recommended by PT    Recommendations for Other Services       Precautions / Restrictions Precautions Precautions: Fall Precaution Comments: monitor sats and HR    Mobility  Bed Mobility Overal bed mobility: Needs Assistance Bed Mobility: Supine to Sit;Sit to Supine     Supine to sit: Supervision;HOB elevated Sit to supine: Supervision;HOB elevated   General bed mobility comments: supervision for lines  Transfers Overall transfer level: Needs assistance Equipment used: Rolling walker (2 wheeled) Transfers: Sit to/from Stand Sit to Stand: Min assist         General transfer comment: assist to rise and steady, verbal cues for safe technique  Ambulation/Gait Ambulation/Gait assistance: Min assist Ambulation Distance (Feet): 80 Feet Assistive device: Rolling walker (2 wheeled) Gait Pattern/deviations: Step-through pattern;Decreased stride length;Trunk flexed     General Gait Details: verbal cues for RW positioning and posture, remained on 2L O2 as RN reports saturations dropped this morning, SpO2 95% 2L O2  upon returning to bed   Stairs            Wheelchair Mobility    Modified Rankin (Stroke Patients Only)       Balance                                    Cognition Arousal/Alertness: Awake/alert Behavior During Therapy: WFL for tasks assessed/performed Overall Cognitive Status: Within Functional Limits for tasks assessed                      Exercises      General Comments        Pertinent Vitals/Pain Pain Assessment: No/denies pain    Home Living                      Prior Function            PT Goals (current goals can now be found in the care plan section) Progress towards PT goals: Progressing toward goals    Frequency  Min 3X/week    PT Plan Current plan remains appropriate    Co-evaluation             End of Session Equipment Utilized During Treatment: Gait belt;Oxygen Activity Tolerance: Patient tolerated treatment well Patient left: with call bell/phone within reach;with family/visitor present;in bed     Time: 1093-2355 PT Time Calculation (min) (ACUTE ONLY): 17 min  Charges:  $Gait Training: 8-22 mins                    G  Codes:      Trena Platt 11/01/2014, 3:21 PM Carmelia Bake, PT, DPT 11-01-14 Pager: [phone]

## 2014-11-01 DIAGNOSIS — R079 Chest pain, unspecified: Secondary | ICD-10-CM

## 2014-11-01 LAB — BASIC METABOLIC PANEL
Anion gap: 9 (ref 5–15)
BUN: 33 mg/dL — ABNORMAL HIGH (ref 6–20)
CO2: 28 mmol/L (ref 22–32)
CREATININE: 1.73 mg/dL — AB (ref 0.44–1.00)
Calcium: 8.9 mg/dL (ref 8.9–10.3)
Chloride: 100 mmol/L — ABNORMAL LOW (ref 101–111)
GFR calc Af Amer: 30 mL/min — ABNORMAL LOW (ref >60)
GFR calc non Af Amer: 26 mL/min — ABNORMAL LOW (ref >60)
GLUCOSE: 117 mg/dL — AB (ref 65–99)
POTASSIUM: 4.5 mmol/L (ref 3.5–5.1)
Sodium: 137 mmol/L (ref 135–145)

## 2014-11-01 LAB — CULTURE, BLOOD (ROUTINE X 2)
CULTURE: NO GROWTH
Culture: NO GROWTH

## 2014-11-01 MED ORDER — DILTIAZEM HCL ER COATED BEADS 120 MG PO CP24: 120.0000 mg | ORAL_CAPSULE | Freq: Every day | ORAL | Status: DC

## 2014-11-01 MED ORDER — FUROSEMIDE 40 MG PO TABS: 40.0000 mg | ORAL_TABLET | Freq: Every day | ORAL | Status: DC

## 2014-11-01 MED ORDER — METOPROLOL SUCCINATE ER 25 MG PO TB24: 12.5000 mg | ORAL_TABLET | Freq: Every day | ORAL | Status: DC

## 2014-11-01 NOTE — Clinical Social Work Placement (Signed)
   CLINICAL SOCIAL WORK PLACEMENT  NOTE  Date:  11/01/2014  Patient Details  Name: Amanda Hogan MRN: [id number] Date of Birth: 1929-03-20  Clinical Social Work is seeking post-discharge placement for this patient at the [place] level of care (*CSW will initial, date and re-position this form in  chart as items are completed):  Yes   Patient/family provided with [facility]'s list of facilities offering this level of care within the geographic area requested by the patient (or if unable, by the patient's family).  Yes   Patient/family informed of their freedom to choose among providers that offer the needed level of care, that participate in Medicare, Medicaid or managed care program needed by the patient, have an available bed and are willing to accept the patient.  Yes   Patient/family informed of [facility]'s ownership interest in [facility] and [facility], as well as of the fact that they are under no obligation to receive care at these facilities.  PASRR submitted to EDS on 10/30/14     PASRR number received on 10/30/14     Existing PASRR number confirmed on       FL2 transmitted to all facilities in geographic area requested by pt/family on 10/30/14     FL2 transmitted to all facilities within larger geographic area on       Patient informed that his/her managed care company has contracts with or will negotiate with certain facilities, including the following:        Yes   Patient/family informed of bed offers received.  Patient chooses bed at [facility]     Physician recommends and patient chooses bed at      Patient to be transferred to [facility] on .11/01/14  Patient to be transferred to facility by [facility]     Patient family notified on 11/01/14 of transfer.  Name of family member notified:  patient's daughter, Onalee Hua via phone     PHYSICIAN       Additional Comment:     _______________________________________________ Carlean Jews, LCSW 11/01/2014, 2:48 PM

## 2014-11-01 NOTE — Progress Notes (Signed)
[facility]  Patient examined today. Discharge plan discussed with patient, RN and Dr Domenic Polite. She is stable to go to SNF today. I updated the d/c summary from yesterday.   Debbe Odea, MD

## 2014-11-01 NOTE — Progress Notes (Signed)
Patient discharged to SNF, telemetry and IV removed, family took pt's belongings from room with them, transported via stretcher per medical transport.

## 2014-11-03 ENCOUNTER — Non-Acute Institutional Stay (SKILLED_NURSING_FACILITY): Payer: PPO | Admitting: Internal Medicine

## 2014-11-03 ENCOUNTER — Encounter: Payer: Self-pay | Admitting: Internal Medicine

## 2014-11-03 DIAGNOSIS — G2581 Restless legs syndrome: Secondary | ICD-10-CM | POA: Diagnosis not present

## 2014-11-03 DIAGNOSIS — I952 Hypotension due to drugs: Secondary | ICD-10-CM

## 2014-11-03 DIAGNOSIS — Z8673 Personal history of transient ischemic attack (TIA), and cerebral infarction without residual deficits: Secondary | ICD-10-CM

## 2014-11-03 DIAGNOSIS — N3090 Cystitis, unspecified without hematuria: Secondary | ICD-10-CM

## 2014-11-03 DIAGNOSIS — I5033 Acute on chronic diastolic (congestive) heart failure: Secondary | ICD-10-CM

## 2014-11-03 DIAGNOSIS — I4891 Unspecified atrial fibrillation: Secondary | ICD-10-CM

## 2014-11-03 DIAGNOSIS — N3080 Other cystitis without hematuria: Secondary | ICD-10-CM

## 2014-11-03 DIAGNOSIS — R32 Unspecified urinary incontinence: Secondary | ICD-10-CM

## 2014-11-03 DIAGNOSIS — F0390 Unspecified dementia without behavioral disturbance: Secondary | ICD-10-CM | POA: Diagnosis not present

## 2014-11-03 DIAGNOSIS — R5381 Other malaise: Secondary | ICD-10-CM

## 2014-11-03 DIAGNOSIS — N179 Acute kidney failure, unspecified: Secondary | ICD-10-CM | POA: Diagnosis not present

## 2014-11-03 DIAGNOSIS — N183 Chronic kidney disease, stage 3 (moderate): Secondary | ICD-10-CM

## 2014-11-03 DIAGNOSIS — D631 Anemia in chronic kidney disease: Secondary | ICD-10-CM

## 2014-11-03 DIAGNOSIS — N189 Chronic kidney disease, unspecified: Secondary | ICD-10-CM

## 2014-11-03 NOTE — Progress Notes (Signed)
Patient ID: Amanda Hogan, female   DOB: 10-01-1928, 81 y.o.   MRN: [id number]     [facility]  PCP: Delia Chimes, NP  Code Status: DNR  Allergies  Allergen Reactions  . Contrast Media [Iodinated Diagnostic Agents]   . Iohexol      Desc: ITCHING/RESPIRATORY DISTRESS     Chief Complaint  Patient presents with  . New Admit To SNF    New Admission      HPI:  81 y.o. patient is here for short term rehabilitation post hospital admission from 10/27/14-11/01/14 with acute respiratory failure in setting of chf exacerbation, UTI and afib with RVR. Cardiology was consulted. She was diuresed, put on o2 and started on ARB but held due to acute renal failure. Anticoagulation was deferred with her dementia and high fall risk. She completed antibiotic treatment for UTI. She has PMH of hypertension, hyperlipidemia, TIA, dementia, recurrent urinary tract infections among others. She is seen in the room today with her son present. She denies any concerns. She has been working with therapy team. She finished her lunch today.   Review of Systems:  Constitutional: Negative for fever, chills, diaphoresis.  HENT: Negative for headache, congestion, nasal discharge Eyes: Negative for eye pain, blurred vision, double vision and discharge.  Respiratory: Negative for cough, shortness of breath and wheezing.   Cardiovascular: Negative for chest pain, palpitations, leg swelling.  Gastrointestinal: Negative for heartburn, nausea, vomiting, abdominal pain. Had bowel movement yesterday. appetite is good. Genitourinary: Negative for dysuria, flank pain.  Musculoskeletal: Negative for back pain, falls Skin: Negative for itching, rash.  Neurological: Negative for dizziness, tingling. Positive for generalized weakness Psychiatric/Behavioral: Negative for depression   Past Medical History  Diagnosis Date  . Hypertension   . Arthritis   . High cholesterol   . Acid reflux   . TIA (transient  ischemic attack)   . Restless leg syndrome   . Complication of anesthesia     "w/my back; gave me too much; thought I'd had stroke but didn't"  . Heart murmur   . Stroke     "said it looked like bullet holes; had a bunch"  . Angina   . Exertional dyspnea   . Sinus malignant neoplasm   . Fibromyalgia   . Memory disorder, possibly not taking home meds at times 07/22/2011  . Anxiety 02/23/2012  . Dementia 02/23/2012  . Kidney stone   . HTN (hypertension)   . Mild carotid artery disease 08/05/11    carotid dopplers, mild disease  . Chest pain     negative lexiscan myoview 53/13, last echo 02/24/12-EF 93-235, mid systolic obliteration of the LV, cavity aortic sclerosis   Past Surgical History  Procedure Laterality Date  . Cholecystectomy  2005  . Vaginal hysterectomy    . Breast cyst excision      left  . Hernia repair  2007    "stomach"  . Lung surgery      "made 3 holes and pulled gel out"  . Knee cartilage surgery      right  . Lumbar spine surgery      "put box in my lower back"  . Elbow bursa surgery      left  . Cataract extraction w/ intraocular lens  implant, bilateral    . Eye surgery  1940    fixed my crossed eyes"  . Kidney stone surgery      "right side; cut it out"  . Carpal tunnel release  right   Social History:   reports that she has never smoked. She has never used smokeless tobacco. She reports that she does not drink alcohol or use illicit drugs.  History reviewed. No pertinent family history.  Medications:   Medication List       This list is accurate as of: 11/03/14 11:09 AM.  Always use your most recent med list.               acetaminophen 500 MG tablet  Commonly known as:  TYLENOL  Take 1,000 mg by mouth every 6 (six) hours as needed. For pain     ALPRAZolam 0.25 MG tablet  Commonly known as:  XANAX  Take 0.25 mg by mouth daily as needed for anxiety.     amLODipine 10 MG tablet  Commonly known as:  NORVASC  Take 10 mg by mouth  daily.     aspirin 81 MG tablet  Take 1 tablet (81 mg total) by mouth daily.     celecoxib 200 MG capsule  Commonly known as:  CELEBREX  Take 200 mg by mouth daily.     citalopram 20 MG tablet  Commonly known as:  CELEXA  Take 1 tablet (20 mg total) by mouth daily.     clopidogrel 75 MG tablet  Commonly known as:  PLAVIX  Take 75 mg by mouth daily.     gabapentin 300 MG capsule  Commonly known as:  NEURONTIN  Take 300 mg by mouth at bedtime.     metoprolol succinate 25 MG 24 hr tablet  Commonly known as:  TOPROL-XL  Take 1 tablet (25 mg total) by mouth daily. HOLD if SBP <100     NAMENDA XR 28 MG Cp24 24 hr capsule  Generic drug:  memantine  Take 28 mg by mouth daily.     nitroGLYCERIN 0.4 MG SL tablet  Commonly known as:  NITROSTAT  Place 0.4 mg under the tongue every 5 (five) minutes as needed. For chest pain     pantoprazole 40 MG tablet  Commonly known as:  PROTONIX  Take 1 tablet (40 mg total) by mouth 2 (two) times daily before a meal.     rOPINIRole 2 MG tablet  Commonly known as:  REQUIP  Take 2 mg by mouth 3 (three) times daily as needed (restless legs).     rOPINIRole 0.5 MG tablet  Commonly known as:  REQUIP  Take 0.5 mg by mouth at bedtime.     rosuvastatin 20 MG tablet  Commonly known as:  CRESTOR  Take 20 mg by mouth daily.     spironolactone 25 MG tablet  Commonly known as:  ALDACTONE  Take 25 mg by mouth daily.     tolterodine 2 MG tablet  Commonly known as:  DETROL  Take 4 mg by mouth 2 (two) times daily.     triamcinolone cream 0.1 %  Commonly known as:  KENALOG  Apply 1 application topically 2 (two) times daily.     trimethoprim 100 MG tablet  Commonly known as:  TRIMPEX  Take 100 mg by mouth at bedtime.         Physical Exam: Filed Vitals:   11/03/14 1052  BP: 98/61  Resp: 20  Weight: 191 lb (86.637 kg)  SpO2: 98%   Wt Readings from Last 3 Encounters:  11/03/14 191 lb (86.637 kg)  11/01/14 194 lb 3.6 oz (88.1 kg)    02/23/12 190 lb 11.2 oz (86.5 kg)  General- elderly female, in no acute distress Head- normocephalic, atraumatic Throat- moist mucus membrane  Eyes- PERRLA, EOMI, no pallor, no icterus, no discharge, normal conjunctiva, normal sclera Neck- no cervical lymphadenopathy, no jugular vein distension Cardiovascular- normal s1,s2, no murmurs, trace leg edema Respiratory- bilateral clear to auscultation, no wheeze, no rhonchi, no crackles, no use of accessory muscles, on o2 Abdomen- bowel sounds present, soft, non tender Musculoskeletal- able to move all 4 extremities, generalized weakness, on wheelchair  Neurological- no focal deficit, alert and oriented to person and place Skin- warm and dry Psychiatry- normal mood and affect    Labs reviewed: Basic Metabolic Panel:  Recent Labs  10/28/14 1151  10/30/14 0340 10/31/14 0345 11/01/14 0350  NA  --   < > 135 134* 137  K  --   < > 4.2 4.3 4.5  CL  --   < > 98* 95* 100*  CO2  --   < > 28 30 28   GLUCOSE  --   < > 106* 119* 117*  BUN  --   < > 27* 32* 33*  CREATININE  --   < > 1.42* 1.90* 1.73*  CALCIUM  --   < > 8.5* 8.8* 8.9  MG 1.8  --   --   --   --   < > = values in this interval not displayed. Liver Function Tests:  Recent Labs  10/27/14 1223  AST 20  ALT 22  ALKPHOS 92  BILITOT 1.4*  PROT 7.9  ALBUMIN 3.5   No results for input(s): LIPASE, AMYLASE in the last 8760 hours. No results for input(s): AMMONIA in the last 8760 hours. CBC:  Recent Labs  10/27/14 1221 10/28/14 0208  WBC 11.0* 9.3  NEUTROABS 8.7*  --   HGB 9.8* 10.1*  HCT 30.4* 30.6*  MCV 94.1 92.4  PLT 306 273   Cardiac Enzymes:  Recent Labs  10/28/14 0208 10/28/14 0756 10/28/14 1410  TROPONINI 0.30* 0.25* 0.18*   BNP: Invalid input(s): POCBNP CBG: No results for input(s): GLUCAP in the last 8760 hours.  Radiological Exams: Dg Chest Port 1 View  10/27/2014   CLINICAL DATA:  Shortness of breath and lethargy  EXAM: PORTABLE CHEST - 1 VIEW   COMPARISON:  11/30/2012  FINDINGS: There is moderate cardiac enlargement. Aortic atherosclerosis identified. There is mild diffuse interstitial edema identified. Atelectasis noted in the right lung base.  IMPRESSION: 1. Cardiac enlargement and mild pulmonary edema. 2. Right base atelectasis. 3. Aortic atherosclerosis.   Electronically Signed   By: Kerby Moors M.D.   On: 10/27/2014 14:40   2-D echo Left ventricle: The cavity size was normal. Systolic function was   normal. The estimated ejection fraction was in the range of 60%   to 65%. Wall motion was normal; there were no regional wall   motion abnormalities. Features are consistent with a pseudonormal   left ventricular filling pattern, with concomitant abnormal   relaxation and increased filling pressure (grade 2 diastolic   dysfunction). Doppler parameters are consistent with high   ventricular filling pressure. - Aortic valve: Trileaflet; mildly thickened, mildly calcified   leaflets. There was mild regurgitation. - Mitral valve: There was mild regurgitation. - Left atrium: The atrium was mildly dilated. - Right atrium: The atrium was mildly dilated. - Tricuspid valve: There was moderate regurgitation. - Pulmonary arteries: PA peak pressure: 54 mm Hg (S).    Assessment/Plan  Physical deconditioning Will have her work with physical therapy and occupational therapy team  to help with gait training and muscle strengthening exercises.fall precautions. Skin care. Encourage to be out of bed.   Acute on chronic CHF euvolemic on exam. Weight stable. Currently on lasix 40 mg daily, toprol xl 12.5 mg daily. bp reading suggestive of hypotension. D/c toprol for now. Decrease lasix to 20 mg daily.  Monitor bp q shift and daily weight for now. Check bmp. Off ARB with renal impairment. Continue o2 for now and wean off as tolerated  Hypotension Currently on lasix, toprol xl and cardizem. Likely iatrogenic. Made changes to lasix dosing and hold  off on toprol xl for now as above. Monitor bp q shift and adjust med as needed  afib Rate currently controlled, continue cardizem current regimen with holding parameters. Not on anticoagulation with her dementia and high fall risk. D/c toprol for now with hypotension  UTI Asymptomatic and has completed her antibiotics, monitor clinically, continue trimethoprim for prophylaxis  ARF on ckd 3 With her on lasix, monitor bmp, decreasing dose of lasix for now  TIA Continue aspirin,plavix and statin  Dementia No behavioral disturbance, continue namenda xr 28 mg and celexa 20 mg daily  Urinary incontinence Continue Detrol  Anemia of chronic disease Monitor h&h  RLS Continue requip and gabapentin for now    Goals of care: short term rehabilitation   Labs/tests ordered: cbc, cmp  Family/ staff Communication: reviewed care plan with patient and nursing supervisor    Blanchie Serve, MD  [facility] [phone] (Monday-Friday 8 am - 5 pm) [phone] (afterhours)

## 2014-11-04 LAB — CULTURE, BLOOD (ROUTINE X 2)

## 2014-11-10 ENCOUNTER — Non-Acute Institutional Stay (SKILLED_NURSING_FACILITY): Payer: PPO | Admitting: Nurse Practitioner

## 2014-11-10 DIAGNOSIS — R5381 Other malaise: Secondary | ICD-10-CM

## 2014-11-10 DIAGNOSIS — N189 Chronic kidney disease, unspecified: Secondary | ICD-10-CM | POA: Diagnosis not present

## 2014-11-10 DIAGNOSIS — N183 Chronic kidney disease, stage 3 unspecified: Secondary | ICD-10-CM

## 2014-11-10 DIAGNOSIS — F0390 Unspecified dementia without behavioral disturbance: Secondary | ICD-10-CM | POA: Diagnosis not present

## 2014-11-10 DIAGNOSIS — I4891 Unspecified atrial fibrillation: Secondary | ICD-10-CM | POA: Diagnosis not present

## 2014-11-10 DIAGNOSIS — Z8673 Personal history of transient ischemic attack (TIA), and cerebral infarction without residual deficits: Secondary | ICD-10-CM | POA: Diagnosis not present

## 2014-11-10 DIAGNOSIS — I5033 Acute on chronic diastolic (congestive) heart failure: Secondary | ICD-10-CM

## 2014-11-10 DIAGNOSIS — D631 Anemia in chronic kidney disease: Secondary | ICD-10-CM

## 2014-11-10 DIAGNOSIS — N179 Acute kidney failure, unspecified: Secondary | ICD-10-CM | POA: Diagnosis not present

## 2014-11-10 DIAGNOSIS — I952 Hypotension due to drugs: Secondary | ICD-10-CM | POA: Diagnosis not present

## 2014-11-10 NOTE — Progress Notes (Signed)
Patient ID: Amanda Hogan, female   DOB: Feb 10, 1929, 81 y.o.   MRN: [id number]    Nursing Home Location:  [place] [facility] Service: SNF (31)  PCP: Delia Chimes, NP  Allergies  Allergen Reactions  . Contrast Media [Iodinated Diagnostic Agents]   . Iohexol      Desc: ITCHING/RESPIRATORY DISTRESS     Chief Complaint  Patient presents with  . Discharge Note    HPI:  Patient is a 81 y.o. female seen today at [facility] for discharge. Pt with hx of hypertension, hyperlipidemia, TIA, dementia, recurrent urinary tract infections. She is at [facility] place for short term rehabilitation post hospital admission from 10/27/14-11/01/14 with acute respiratory failure in setting of chf exacerbation, UTI and afib with RVR. Cardiology consulted, anticoagulation was deferred with her dementia and high fall risk. She completed antibiotic treatment for UTI and was diuresed for CHF. Pt in room with son and daughter. Patient currently doing well with therapy, now stable to discharge home with home health.  Review of Systems:  Review of Systems  Constitutional: Negative for activity change, appetite change, fatigue and unexpected weight change.  HENT: Negative for congestion and hearing loss.   Eyes: Negative.   Respiratory: Negative for cough and shortness of breath.   Cardiovascular: Negative for chest pain, palpitations and leg swelling.  Gastrointestinal: Negative for abdominal pain, diarrhea and constipation.  Genitourinary: Negative for dysuria and difficulty urinating.  Musculoskeletal: Negative for myalgias and arthralgias.  Skin: Negative for color change and wound.  Neurological: Negative for dizziness and weakness.  Psychiatric/Behavioral: Positive for confusion (memory loss). Negative for behavioral problems and agitation.    Past Medical History  Diagnosis Date  . Hypertension   . Arthritis   . High cholesterol   . Acid reflux   . TIA  (transient ischemic attack)   . Restless leg syndrome   . Complication of anesthesia     "w/my back; gave me too much; thought I'd had stroke but didn't"  . Heart murmur   . Stroke     "said it looked like bullet holes; had a bunch"  . Angina   . Exertional dyspnea   . Sinus malignant neoplasm   . Fibromyalgia   . Memory disorder, possibly not taking home meds at times 07/22/2011  . Anxiety 02/23/2012  . Dementia 02/23/2012  . Kidney stone   . HTN (hypertension)   . Mild carotid artery disease 08/05/11    carotid dopplers, mild disease  . Chest pain     negative lexiscan myoview 53/13, last echo 02/24/12-EF 35-573, mid systolic obliteration of the LV, cavity aortic sclerosis   Past Surgical History  Procedure Laterality Date  . Cholecystectomy  2005  . Vaginal hysterectomy    . Breast cyst excision      left  . Hernia repair  2007    "stomach"  . Lung surgery      "made 3 holes and pulled gel out"  . Knee cartilage surgery      right  . Lumbar spine surgery      "put box in my lower back"  . Elbow bursa surgery      left  . Cataract extraction w/ intraocular lens  implant, bilateral    . Eye surgery  1940    fixed my crossed eyes"  . Kidney stone surgery      "right side; cut it out"  . Carpal tunnel release  right   Social History:   reports that she has never smoked. She has never used smokeless tobacco. She reports that she does not drink alcohol or use illicit drugs.  No family history on file.  Medications: Patient's Medications  New Prescriptions   No medications on file  Previous Medications   ACETAMINOPHEN (TYLENOL) 500 MG TABLET    Take 1,000 mg by mouth every 6 (six) hours as needed. For pain   ALPRAZOLAM (XANAX) 0.25 MG TABLET    Take 0.25 mg by mouth daily as needed for anxiety.   AMLODIPINE (NORVASC) 10 MG TABLET    Take 10 mg by mouth daily.   ASPIRIN 81 MG TABLET    Take 1 tablet (81 mg total) by mouth daily.   CELECOXIB (CELEBREX) 200 MG  CAPSULE    Take 200 mg by mouth daily.   CITALOPRAM (CELEXA) 20 MG TABLET    Take 1 tablet (20 mg total) by mouth daily.   CLOPIDOGREL (PLAVIX) 75 MG TABLET    Take 75 mg by mouth daily.   GABAPENTIN (NEURONTIN) 300 MG CAPSULE    Take 300 mg by mouth at bedtime.   MEMANTINE (NAMENDA XR) 28 MG CP24 24 HR CAPSULE    Take 28 mg by mouth daily.   Lasix 20 mg daily by mouth    NITROGLYCERIN (NITROSTAT) 0.4 MG SL TABLET    Place 0.4 mg under the tongue every 5 (five) minutes as needed. For chest pain   PANTOPRAZOLE (PROTONIX) 40 MG TABLET    Take 1 tablet (40 mg total) by mouth 2 (two) times daily before a meal.   ROPINIROLE (REQUIP) 0.5 MG TABLET    Take 0.5 mg by mouth at bedtime.   ROPINIROLE (REQUIP) 2 MG TABLET    Take 2 mg by mouth 3 (three) times daily as needed (restless legs).   ROSUVASTATIN (CRESTOR) 20 MG TABLET    Take 20 mg by mouth daily.   SPIRONOLACTONE (ALDACTONE) 25 MG TABLET    Take 25 mg by mouth daily.   TOLTERODINE (DETROL) 2 MG TABLET    Take 4 mg by mouth 2 (two) times daily.   TRIAMCINOLONE CREAM (KENALOG) 0.1 %    Apply 1 application topically 2 (two) times daily.   TRIMETHOPRIM (TRIMPEX) 100 MG TABLET    Take 100 mg by mouth at bedtime.  Modified Medications   No medications on file  Discontinued Medications   No medications on file     Physical Exam: Filed Vitals:   11/10/14 1627  BP: 131/79  Pulse: 76  Temp: 97.5 F (36.4 C)  Resp: 20  SpO2: 97%    Physical Exam  Constitutional: She appears well-developed and well-nourished. No distress.  HENT:  Head: Normocephalic and atraumatic.  Mouth/Throat: Oropharynx is clear and moist. No oropharyngeal exudate.  Eyes: Conjunctivae are normal. Pupils are equal, round, and reactive to light.  Neck: Normal range of motion. Neck supple.  Cardiovascular: Normal rate, regular rhythm and normal heart sounds.   Pulmonary/Chest: Effort normal and breath sounds normal.  Abdominal: Soft. Bowel sounds are normal.    Musculoskeletal: She exhibits no edema or tenderness.  Neurological: She is alert.  Skin: Skin is warm and dry. She is not diaphoretic.  Psychiatric: She has a normal mood and affect.    Labs reviewed: Basic Metabolic Panel:  Recent Labs  10/28/14 1151  10/30/14 0340 10/31/14 0345 11/01/14 0350  NA  --   < > 135 134* 137  K  --   < >  4.2 4.3 4.5  CL  --   < > 98* 95* 100*  CO2  --   < > 28 30 28   GLUCOSE  --   < > 106* 119* 117*  BUN  --   < > 27* 32* 33*  CREATININE  --   < > 1.42* 1.90* 1.73*  CALCIUM  --   < > 8.5* 8.8* 8.9  MG 1.8  --   --   --   --   < > = values in this interval not displayed. Liver Function Tests:  Recent Labs  10/27/14 1223  AST 20  ALT 22  ALKPHOS 92  BILITOT 1.4*  PROT 7.9  ALBUMIN 3.5   No results for input(s): LIPASE, AMYLASE in the last 8760 hours. No results for input(s): AMMONIA in the last 8760 hours. CBC:  Recent Labs  10/27/14 1221 10/28/14 0208  WBC 11.0* 9.3  NEUTROABS 8.7*  --   HGB 9.8* 10.1*  HCT 30.4* 30.6*  MCV 94.1 92.4  PLT 306 273   TSH:  Recent Labs  10/28/14 1151  TSH 4.336   A1C: Lab Results  Component Value Date   HGBA1C 6.3* 02/24/2012   Lipid Panel: No results for input(s): CHOL, HDL, LDLCALC, TRIG, CHOLHDL, LDLDIRECT in the last 8760 hours.    Assessment/Plan 1. Acute on chronic diastolic congestive heart failure euvolemic on exam. Weight stable. Lasix was decreased to 20 mg daily,pt tolerating well.  Off Oxygen at this time  2. Hypotension due to drugs Has improved with decrease in lasix and stopping toprol  3. Atrial fibrillation with RVR (new) Currently in SR at this time, rate controlled.  Not on anticoagulation with her dementia and high fall risk.  4. History of TIA (transient ischemic attack) -stable, pt currently on ASA, plavix and statin -pt with recent decrease in hgb, will stop ASA at this time and cont plavix  5. Acute renal failure superimposed on stage 3 chronic  kidney disease -will get follow up bmp at this time, will also need outpatient follow up.   6. Anemia in chronic kidney disease -worsening anemia on recent lab, pt on celebrex ASA, Plavix, along with CKD -will recent BMP, CBC, hemoccult stools x3, and pt,ptt at this time.  Spoke with Dr Bubba Camp and will follow up up labs prior to discharge home -for now will stop ASA, celebrex   7. Dementia without behavioral disturbance Remains stable, continue namenda xr 28 mg  8. Recurrent UTI Completed antibiotic course in hospital. continue trimethoprim for prophylaxis.  pt is stable for discharge, will follow up labs prior to discharge. -will need PT/OT per home health. No DME needed. Rx written.  will need to follow up with PCP within 2 weeks.    Amanda Hogan. Amanda Hogan  [facility] [phone] 8 am - 5 pm) [phone] (after hours)

## 2014-11-20 ENCOUNTER — Emergency Department
Admission: EM | Admit: 2014-11-20 | Discharge: 2014-11-20 | Disposition: A | Discharge status: Home / Self Care | Payer: PPO | Attending: Emergency Medicine | Admitting: Emergency Medicine

## 2014-11-20 ENCOUNTER — Other Ambulatory Visit: Payer: Self-pay

## 2014-11-20 ENCOUNTER — Emergency Department: Payer: PPO

## 2014-11-20 ENCOUNTER — Ambulatory Visit: Payer: PPO | Admitting: Cardiology

## 2014-11-20 DIAGNOSIS — Z7982 Long term (current) use of aspirin: Secondary | ICD-10-CM | POA: Insufficient documentation

## 2014-11-20 DIAGNOSIS — Z792 Long term (current) use of antibiotics: Secondary | ICD-10-CM | POA: Diagnosis not present

## 2014-11-20 DIAGNOSIS — R079 Chest pain, unspecified: Secondary | ICD-10-CM | POA: Insufficient documentation

## 2014-11-20 DIAGNOSIS — Z791 Long term (current) use of non-steroidal anti-inflammatories (NSAID): Secondary | ICD-10-CM | POA: Diagnosis not present

## 2014-11-20 DIAGNOSIS — Z7952 Long term (current) use of systemic steroids: Secondary | ICD-10-CM | POA: Insufficient documentation

## 2014-11-20 DIAGNOSIS — R112 Nausea with vomiting, unspecified: Secondary | ICD-10-CM | POA: Diagnosis not present

## 2014-11-20 DIAGNOSIS — I10 Essential (primary) hypertension: Secondary | ICD-10-CM | POA: Diagnosis not present

## 2014-11-20 DIAGNOSIS — Z79899 Other long term (current) drug therapy: Secondary | ICD-10-CM | POA: Insufficient documentation

## 2014-11-20 LAB — COMPREHENSIVE METABOLIC PANEL
ALT: 13 U/L — ABNORMAL LOW (ref 14–54)
AST: 29 U/L (ref 15–41)
Albumin: 4.3 g/dL (ref 3.5–5.0)
Alkaline Phosphatase: 66 U/L (ref 38–126)
Anion gap: 8 (ref 5–15)
BILIRUBIN TOTAL: 1.1 mg/dL (ref 0.3–1.2)
BUN: 29 mg/dL — AB (ref 6–20)
CHLORIDE: 102 mmol/L (ref 101–111)
CO2: 24 mmol/L (ref 22–32)
Calcium: 9.3 mg/dL (ref 8.9–10.3)
Creatinine, Ser: 1.41 mg/dL — ABNORMAL HIGH (ref 0.44–1.00)
GFR, EST AFRICAN AMERICAN: 39 mL/min — AB (ref >60)
GFR, EST NON AFRICAN AMERICAN: 33 mL/min — AB (ref >60)
Glucose, Bld: 99 mg/dL (ref 65–99)
Potassium: 5.3 mmol/L — ABNORMAL HIGH (ref 3.5–5.1)
Sodium: 134 mmol/L — ABNORMAL LOW (ref 135–145)
TOTAL PROTEIN: 8.4 g/dL — AB (ref 6.5–8.1)

## 2014-11-20 LAB — URINALYSIS COMPLETE WITH MICROSCOPIC (ARMC ONLY)
BILIRUBIN URINE: NEGATIVE
Bacteria, UA: NONE SEEN
GLUCOSE, UA: NEGATIVE mg/dL
Hgb urine dipstick: NEGATIVE
Ketones, ur: NEGATIVE mg/dL
Nitrite: NEGATIVE
PH: 5 (ref 5.0–8.0)
Protein, ur: NEGATIVE mg/dL
Specific Gravity, Urine: 1.016 (ref 1.005–1.030)

## 2014-11-20 LAB — CBC
HCT: 35.7 % (ref 35.0–47.0)
Hemoglobin: 11.9 g/dL — ABNORMAL LOW (ref 12.0–16.0)
MCH: 30.2 pg (ref 26.0–34.0)
MCHC: 33.4 g/dL (ref 32.0–36.0)
MCV: 90.5 fL (ref 80.0–100.0)
PLATELETS: 275 10*3/uL (ref 150–440)
RBC: 3.94 MIL/uL (ref 3.80–5.20)
RDW: 13.9 % (ref 11.5–14.5)
WBC: 8.2 10*3/uL (ref 3.6–11.0)

## 2014-11-20 LAB — TROPONIN I

## 2014-11-20 MED ORDER — ROPINIROLE HCL 1 MG PO TABS
2.0000 mg | ORAL_TABLET | Freq: Once | ORAL | Status: AC
  Administered 2014-11-20: 2 mg via ORAL
  Filled 2014-11-20: qty 2

## 2014-11-20 NOTE — Discharge Instructions (Signed)
You were evaluated after episode of chest discomfort overnight, and her examiner evaluation are reassuring today. I'm recommending he follow-up with cardiologist within the next 1-2 days. Cardiology's office to make this appointment. Return to emergency department for any worsening condition including any new or worsening chest pain, nausea, sweating, dizziness, altered mental status, or passing out.   Chest Pain (Nonspecific) It is often hard to give a specific diagnosis for the cause of chest pain. There is always a chance that your pain could be related to something serious, such as a heart attack or a blood clot in the lungs. You need to follow up with your health care provider for further evaluation. CAUSES   Heartburn.  Pneumonia or bronchitis.  Anxiety or stress.  Inflammation around your heart (pericarditis) or lung (pleuritis or pleurisy).  A blood clot in the lung.  A collapsed lung (pneumothorax). It can develop suddenly on its own (spontaneous pneumothorax) or from trauma to the chest.  Shingles infection (herpes zoster virus). The chest wall is composed of bones, muscles, and cartilage. Any of these can be the source of the pain.  The bones can be bruised by injury.  The muscles or cartilage can be strained by coughing or overwork.  The cartilage can be affected by inflammation and become sore (costochondritis). DIAGNOSIS  Lab tests or other studies may be needed to find the cause of your pain. Your health care provider may have you take a test called an ambulatory electrocardiogram (ECG). An ECG records your heartbeat patterns over a 24-hour period. You may also have other tests, such as:  Transthoracic echocardiogram (TTE). During echocardiography, sound waves are used to evaluate how blood flows through your heart.  Transesophageal echocardiogram (TEE).  Cardiac monitoring. This allows your health care provider to monitor your heart rate and rhythm in real  time.  Holter monitor. This is a portable device that records your heartbeat and can help diagnose heart arrhythmias. It allows your health care provider to track your heart activity for several days, if needed.  Stress tests by exercise or by giving medicine that makes the heart beat faster. TREATMENT   Treatment depends on what may be causing your chest pain. Treatment may include:  Acid blockers for heartburn.  Anti-inflammatory medicine.  Pain medicine for inflammatory conditions.  Antibiotics if an infection is present.  You may be advised to change lifestyle habits. This includes stopping smoking and avoiding alcohol, caffeine, and chocolate.  You may be advised to keep your head raised (elevated) when sleeping. This reduces the chance of acid going backward from your stomach into your esophagus. Most of the time, nonspecific chest pain will improve within 2-3 days with rest and mild pain medicine.  HOME CARE INSTRUCTIONS   If antibiotics were prescribed, take them as directed. Finish them even if you start to feel better.  For the next few days, avoid physical activities that bring on chest pain. Continue physical activities as directed.  Do not use any tobacco products, including cigarettes, chewing tobacco, or electronic cigarettes.  Avoid drinking alcohol.  Only take medicine as directed by your health care provider.  Follow your health care provider's suggestions for further testing if your chest pain does not go away.  Keep any follow-up appointments you made. If you do not go to an appointment, you could develop lasting (chronic) problems with pain. If there is any problem keeping an appointment, call to reschedule. SEEK MEDICAL CARE IF:   Your chest pain does not  go away, even after treatment.  You have a rash with blisters on your chest.  You have a fever. SEEK IMMEDIATE MEDICAL CARE IF:   You have increased chest pain or pain that spreads to your arm,  neck, jaw, back, or abdomen.  You have shortness of breath.  You have an increasing cough, or you cough up blood.  You have severe back or abdominal pain.  You feel nauseous or vomit.  You have severe weakness.  You faint.  You have chills. This is an emergency. Do not wait to see if the pain will go away. Get medical help at once. Call your local emergency services (911 in [place] Do not drive yourself to the hospital. MAKE SURE YOU:   Understand these instructions.  Will watch your condition.  Will get help right away if you are not doing well or get worse. Document Released: 12/15/2004 Document Revised: 03/12/2013 Document Reviewed: 10/11/2007 [facility] Patient Information 2015 [place], [place]. This information is not intended to replace advice given to you by your health care provider. Make sure you discuss any questions you have with your health care provider.

## 2014-11-20 NOTE — ED Provider Notes (Signed)
[facility] Emergency Department Provider Note   ____________________________________________  Time seen: 10 AM I have reviewed the triage vital signs and the triage nursing note.  HISTORY  Chief Complaint Emesis and Chest Pain   Historian Patient and her son  HPI Amanda Hogan is a 81 y.o. female who is here complaining of chest pains which occurred a few times over night in the setting of nausea and multiple episodes of vomiting. These were sharp chest pain which was centralized. Symptoms started last night and started with nausea then proceeded to vomiting, followed by some intermittent chest pains.. She feels a little bit better at the present moment. No shortness of breath, chest pain, abdominal pain, vomiting, nausea, or diarrhea currently. No fever. She was recently released from [facility] for what sounds like congestive heart failure, and followed by one week nursing rehabilitation stay. She states those symptoms had improved after discharge.. Symptoms were mild to moderate overnight, and are none now  She has a history of indigestion and possible esophageal spasm, and states that the patient could've been related to that.    Past Medical History  Diagnosis Date  . Hypertension   . Arthritis   . High cholesterol   . Acid reflux   . TIA (transient ischemic attack)   . Restless leg syndrome   . Complication of anesthesia     "w/my back; gave me too much; thought I'd had stroke but didn't"  . Heart murmur   . Stroke     "said it looked like bullet holes; had a bunch"  . Angina   . Exertional dyspnea   . Sinus malignant neoplasm   . Fibromyalgia   . Memory disorder, possibly not taking home meds at times 07/22/2011  . Anxiety 02/23/2012  . Dementia 02/23/2012  . Kidney stone   . HTN (hypertension)   . Mild carotid artery disease 08/05/11    carotid dopplers, mild disease  . Chest pain     negative lexiscan myoview 53/13, last echo 02/24/12-EF  90-300, mid systolic obliteration of the LV, cavity aortic sclerosis    Patient Active Problem List   Diagnosis Date Noted  . Hypokalemia 10/29/2014  . Diastolic dysfunction-grade 2 10/29/2014  . Pulmonary hypertension-moderate 10/29/2014  . Troponin level elevated-suspect demand ischemia from AF with RVR 10/28/2014  . Lymphoma history 10/28/2014  . Acute diastolic CHF (congestive heart failure) 10/27/2014  . Atrial fibrillation with RVR (new)   . TIA (transient ischemic attack) 02/25/2012  . Encephalopathy acute 02/23/2012  . Anemia 02/23/2012  . Chronic back pain 02/23/2012  . Anxiety 02/23/2012  . Mild carotid artery disease 08/05/2011  . Memory disorder, possibly not taking home meds at times 07/22/2011  . Hypertension, at times poorly controlled 07/21/2011  . Chest pain-low risk Myoview May 2013 07/21/2011  . Hyperlipidemia 07/21/2011  . Gastroesophageal reflux disease 07/21/2011  . History of TIA (transient ischemic attack) 07/21/2011  . Obesity 07/21/2011    Past Surgical History  Procedure Laterality Date  . Cholecystectomy  2005  . Vaginal hysterectomy    . Breast cyst excision      left  . Hernia repair  2007    "stomach"  . Lung surgery      "made 3 holes and pulled gel out"  . Knee cartilage surgery      right  . Lumbar spine surgery      "put box in my lower back"  . Elbow bursa surgery  left  . Cataract extraction w/ intraocular lens  implant, bilateral    . Eye surgery  1940    fixed my crossed eyes"  . Kidney stone surgery      "right side; cut it out"  . Carpal tunnel release      right    Current Outpatient Rx  Name  Route  Sig  Dispense  Refill  . acetaminophen (TYLENOL) 500 MG tablet   Oral   Take 1,000 mg by mouth every 6 (six) hours as needed. For pain         . ALPRAZolam (XANAX) 0.25 MG tablet   Oral   Take 0.25 mg by mouth daily as needed for anxiety.         Marland Kitchen amLODipine (NORVASC) 10 MG tablet   Oral   Take 10 mg by  mouth daily.         Marland Kitchen aspirin 81 MG tablet   Oral   Take 1 tablet (81 mg total) by mouth daily.         . celecoxib (CELEBREX) 200 MG capsule   Oral   Take 200 mg by mouth daily.         . citalopram (CELEXA) 20 MG tablet   Oral   Take 1 tablet (20 mg total) by mouth daily.   31 tablet   0   . clopidogrel (PLAVIX) 75 MG tablet   Oral   Take 75 mg by mouth daily.         Marland Kitchen gabapentin (NEURONTIN) 300 MG capsule   Oral   Take 300 mg by mouth at bedtime.         . memantine (NAMENDA XR) 28 MG CP24 24 hr capsule   Oral   Take 28 mg by mouth daily.         . metoprolol succinate (TOPROL-XL) 25 MG 24 hr tablet   Oral   Take 1 tablet (25 mg total) by mouth daily. HOLD if SBP <100         . nitroGLYCERIN (NITROSTAT) 0.4 MG SL tablet   Sublingual   Place 0.4 mg under the tongue every 5 (five) minutes as needed. For chest pain         . pantoprazole (PROTONIX) 40 MG tablet   Oral   Take 1 tablet (40 mg total) by mouth 2 (two) times daily before a meal.   62 tablet   0   . Probiotic Product ([employer] PROBIOTIC) CHEW   Oral   Chew 1 tablet by mouth daily.         Marland Kitchen rOPINIRole (REQUIP) 0.5 MG tablet   Oral   Take 0.5 mg by mouth at bedtime.         Marland Kitchen rOPINIRole (REQUIP) 2 MG tablet   Oral   Take 2 mg by mouth 3 (three) times daily as needed (restless legs).         . rosuvastatin (CRESTOR) 20 MG tablet   Oral   Take 20 mg by mouth daily.         Marland Kitchen spironolactone (ALDACTONE) 25 MG tablet   Oral   Take 25 mg by mouth daily.         Marland Kitchen tolterodine (DETROL) 2 MG tablet   Oral   Take 4 mg by mouth 2 (two) times daily.         Marland Kitchen triamcinolone cream (KENALOG) 0.1 %   Topical   Apply 1 application topically 2 (two)  times daily.         Marland Kitchen trimethoprim (TRIMPEX) 100 MG tablet   Oral   Take 100 mg by mouth at bedtime.           Allergies Contrast media and Iohexol  No family history on file.  Social History Social History  Substance  Use Topics  . Smoking status: Never Smoker   . Smokeless tobacco: Never Used  . Alcohol Use: No    Review of Systems  Constitutional: Negative for fever. Eyes: Negative for visual changes. ENT: Negative for sore throat. Cardiovascular: Negative for palpitations Respiratory: Negative for shortness of breath. Gastrointestinal: Negative for abdominal pain.. Genitourinary: Negative for dysuria. Musculoskeletal: Negative for back pain. Skin: Negative for rash. Neurological: Negative for headache. 10 point Review of Systems otherwise negative ____________________________________________   PHYSICAL EXAM:  VITAL SIGNS: ED Triage Vitals  Enc Vitals Group     BP 11/20/14 0925 127/57 mmHg     Pulse Rate 11/20/14 0925 72     Resp 11/20/14 0925 18     Temp 11/20/14 0925 98.9 F (37.2 C)     Temp Source 11/20/14 0925 Oral     SpO2 11/20/14 0925 98 %     Weight 11/20/14 0925 176 lb (79.833 kg)     Height 11/20/14 0925 5\' 4"  (1.626 m)     Head Cir --      Peak Flow --      Pain Score --      Pain Loc --      Pain Edu? --      Excl. in [place]? --      Constitutional: Alert and oriented. Well appearing and in no distress. Eyes: Conjunctivae are normal. PERRL. Normal extraocular movements. ENT   Head: Normocephalic and atraumatic.   Nose: No congestion/rhinnorhea.   Mouth/Throat: Mucous membranes are moist.   Neck: No stridor. Cardiovascular/Chest: Normal rate, regular rhythm.  No murmurs, rubs, or gallops. Respiratory: Normal respiratory effort without tachypnea nor retractions. Breath sounds are clear and equal bilaterally. No wheezes/rales/rhonchi. Gastrointestinal: Soft. No distention, no guarding, no rebound. Nontender. Obese.  Genitourinary/rectal:Deferred Musculoskeletal: Nontender with normal range of motion in all extremities. No joint effusions.  No lower extremity tenderness.  No edema. Neurologic:  Normal speech and language. No gross or focal neurologic  deficits are appreciated. Skin:  Skin is warm, dry and intact. No rash noted. Psychiatric: Mood and affect are normal. Speech and behavior are normal. Patient exhibits appropriate insight and judgment.  ____________________________________________   EKG I, Lisa Roca, MD, the attending physician have personally viewed and interpreted all ECGs.  71 bpm. Normal sinus rhythm. Narrow QRS. Normal axis. Normal ST and T-wave. ____________________________________________  LABS (pertinent positives/negatives)  CBC shows a white blood count of 8.2 and hemoglobin 11.9 with platelets 275 Troponin less than 2.40 Complete metabolic panel significant for sodium 134, potassium 5.3 with possible hemolysis, BUN 29 and creatinine 1.41, liver function tests within normal limits Urinalysis trace leukocytes and otherwise negative ____________________________________________  RADIOLOGY All Xrays were viewed by me. Imaging interpreted by Radiologist.  Chest x-ray portable:  IMPRESSION: Cardiomegaly without congestive failure.  Low lung volumes with mild bibasilar atelectasis. __________________________________________  PROCEDURES  Procedure(s) performed: None  Critical Care performed: None  ____________________________________________   ED COURSE / ASSESSMENT AND PLAN  CONSULTATIONS: None  Pertinent labs & imaging results that were available during my care of the patient were reviewed by me and considered in my medical decision making (see chart for details).  Patient and nonspecific chest discomfort overnight, and it been over 4 hours since her symptoms, and her EKG and troponin are reassuring. She has had some vomiting overnight, however she's not had any this morning. She's not had any additional vomiting here in the emergency department. The rest of her exam and evaluation are reassuring. I'm going to go ahead and discharge her to have outpatient follow-up with her primary care  physician and a cardiologist.  Patient / Family / Caregiver informed of clinical course, medical decision-making process, and agree with plan.   I discussed return precautions, follow-up instructions, and discharged instructions with patient and/or family.  ___________________________________________   FINAL CLINICAL IMPRESSION(S) / ED DIAGNOSES   Final diagnoses:  Chest pain, unspecified chest pain type       Lisa Roca, MD 11/20/14 1417

## 2014-11-20 NOTE — ED Notes (Signed)
Patient presents to the ED with centralized intermittent chest pain since last night, nausea and vomiting x 3.  Patient denies chest pain at this time.  Patient was recently released from the hospital and states she was admitted for, "fluid around my heart".  Patient denies nausea and vomiting at this time.  Patient reports shortness of breath that coincides with the chest pain.

## 2014-11-29 ENCOUNTER — Other Ambulatory Visit: Payer: Self-pay | Admitting: Internal Medicine

## 2014-12-09 ENCOUNTER — Other Ambulatory Visit: Payer: Self-pay | Admitting: Internal Medicine

## 2014-12-16 ENCOUNTER — Inpatient Hospital Stay
Admission: EM | Admit: 2014-12-16 | Discharge: 2014-12-19 | DRG: 064 | Disposition: A | Discharge status: Skilled Nursing Facility | Payer: PPO | Attending: Specialist | Admitting: Specialist

## 2014-12-16 ENCOUNTER — Encounter: Payer: Self-pay | Admitting: Emergency Medicine

## 2014-12-16 ENCOUNTER — Emergency Department: Payer: PPO

## 2014-12-16 DIAGNOSIS — G2581 Restless legs syndrome: Secondary | ICD-10-CM | POA: Diagnosis present

## 2014-12-16 DIAGNOSIS — M1990 Unspecified osteoarthritis, unspecified site: Secondary | ICD-10-CM | POA: Diagnosis present

## 2014-12-16 DIAGNOSIS — Z888 Allergy status to other drugs, medicaments and biological substances status: Secondary | ICD-10-CM

## 2014-12-16 DIAGNOSIS — M797 Fibromyalgia: Secondary | ICD-10-CM | POA: Diagnosis present

## 2014-12-16 DIAGNOSIS — I509 Heart failure, unspecified: Secondary | ICD-10-CM | POA: Diagnosis present

## 2014-12-16 DIAGNOSIS — G8929 Other chronic pain: Secondary | ICD-10-CM | POA: Diagnosis present

## 2014-12-16 DIAGNOSIS — E780 Pure hypercholesterolemia: Secondary | ICD-10-CM | POA: Diagnosis present

## 2014-12-16 DIAGNOSIS — I272 Other secondary pulmonary hypertension: Secondary | ICD-10-CM | POA: Diagnosis present

## 2014-12-16 DIAGNOSIS — K219 Gastro-esophageal reflux disease without esophagitis: Secondary | ICD-10-CM | POA: Diagnosis present

## 2014-12-16 DIAGNOSIS — Z8673 Personal history of transient ischemic attack (TIA), and cerebral infarction without residual deficits: Secondary | ICD-10-CM | POA: Diagnosis not present

## 2014-12-16 DIAGNOSIS — Z79899 Other long term (current) drug therapy: Secondary | ICD-10-CM

## 2014-12-16 DIAGNOSIS — F0390 Unspecified dementia without behavioral disturbance: Secondary | ICD-10-CM | POA: Diagnosis present

## 2014-12-16 DIAGNOSIS — Z91041 Radiographic dye allergy status: Secondary | ICD-10-CM

## 2014-12-16 DIAGNOSIS — F329 Major depressive disorder, single episode, unspecified: Secondary | ICD-10-CM | POA: Diagnosis present

## 2014-12-16 DIAGNOSIS — I639 Cerebral infarction, unspecified: Secondary | ICD-10-CM | POA: Diagnosis present

## 2014-12-16 DIAGNOSIS — Z8572 Personal history of non-Hodgkin lymphomas: Secondary | ICD-10-CM

## 2014-12-16 DIAGNOSIS — I4891 Unspecified atrial fibrillation: Secondary | ICD-10-CM | POA: Diagnosis present

## 2014-12-16 DIAGNOSIS — G9340 Encephalopathy, unspecified: Secondary | ICD-10-CM | POA: Diagnosis present

## 2014-12-16 DIAGNOSIS — Z7902 Long term (current) use of antithrombotics/antiplatelets: Secondary | ICD-10-CM | POA: Diagnosis not present

## 2014-12-16 DIAGNOSIS — E785 Hyperlipidemia, unspecified: Secondary | ICD-10-CM | POA: Diagnosis present

## 2014-12-16 DIAGNOSIS — Z7982 Long term (current) use of aspirin: Secondary | ICD-10-CM

## 2014-12-16 DIAGNOSIS — E871 Hypo-osmolality and hyponatremia: Secondary | ICD-10-CM | POA: Diagnosis present

## 2014-12-16 DIAGNOSIS — F419 Anxiety disorder, unspecified: Secondary | ICD-10-CM | POA: Diagnosis present

## 2014-12-16 DIAGNOSIS — I10 Essential (primary) hypertension: Secondary | ICD-10-CM | POA: Diagnosis present

## 2014-12-16 DIAGNOSIS — N390 Urinary tract infection, site not specified: Secondary | ICD-10-CM | POA: Diagnosis present

## 2014-12-16 LAB — URINALYSIS COMPLETE WITH MICROSCOPIC (ARMC ONLY)
Bacteria, UA: NONE SEEN
Bilirubin Urine: NEGATIVE
Glucose, UA: NEGATIVE mg/dL
KETONES UR: NEGATIVE mg/dL
Leukocytes, UA: NEGATIVE
Nitrite: NEGATIVE
PROTEIN: NEGATIVE mg/dL
Specific Gravity, Urine: 1.014 (ref 1.005–1.030)
pH: 5 (ref 5.0–8.0)

## 2014-12-16 LAB — CBC WITH DIFFERENTIAL/PLATELET
Basophils Absolute: 0.2 10*3/uL — ABNORMAL HIGH (ref 0–0.1)
Basophils Relative: 2 %
EOS ABS: 0.6 10*3/uL (ref 0–0.7)
EOS PCT: 8 %
HCT: 35.4 % (ref 35.0–47.0)
HEMOGLOBIN: 11.8 g/dL — AB (ref 12.0–16.0)
LYMPHS ABS: 1.1 10*3/uL (ref 1.0–3.6)
Lymphocytes Relative: 15 %
MCH: 30.4 pg (ref 26.0–34.0)
MCHC: 33.4 g/dL (ref 32.0–36.0)
MCV: 91.1 fL (ref 80.0–100.0)
MONO ABS: 0.4 10*3/uL (ref 0.2–0.9)
MONOS PCT: 6 %
Neutro Abs: 5.1 10*3/uL (ref 1.4–6.5)
Neutrophils Relative %: 69 %
Platelets: 323 10*3/uL (ref 150–440)
RBC: 3.89 MIL/uL (ref 3.80–5.20)
RDW: 14.1 % (ref 11.5–14.5)
WBC: 7.4 10*3/uL (ref 3.6–11.0)

## 2014-12-16 LAB — LIPID PANEL
CHOL/HDL RATIO: 3.4 ratio
CHOLESTEROL: 114 mg/dL (ref 0–200)
HDL: 34 mg/dL — ABNORMAL LOW (ref >40)
LDL CALC: 39 mg/dL (ref 0–99)
Triglycerides: 207 mg/dL — ABNORMAL HIGH (ref <150)
VLDL: 41 mg/dL — AB (ref 0–40)

## 2014-12-16 LAB — BASIC METABOLIC PANEL
Anion gap: 8 (ref 5–15)
BUN: 31 mg/dL — AB (ref 6–20)
CHLORIDE: 99 mmol/L — AB (ref 101–111)
CO2: 23 mmol/L (ref 22–32)
CREATININE: 1.71 mg/dL — AB (ref 0.44–1.00)
Calcium: 9.3 mg/dL (ref 8.9–10.3)
GFR calc Af Amer: 31 mL/min — ABNORMAL LOW (ref >60)
GFR calc non Af Amer: 26 mL/min — ABNORMAL LOW (ref >60)
GLUCOSE: 125 mg/dL — AB (ref 65–99)
Potassium: 4.8 mmol/L (ref 3.5–5.1)
Sodium: 130 mmol/L — ABNORMAL LOW (ref 135–145)

## 2014-12-16 MED ORDER — CITALOPRAM HYDROBROMIDE 20 MG PO TABS
20.0000 mg | ORAL_TABLET | Freq: Every day | ORAL | Status: DC
  Administered 2014-12-17 – 2014-12-19 (×4): 20 mg via ORAL
  Filled 2014-12-16 (×4): qty 1

## 2014-12-16 MED ORDER — OXYCODONE HCL 5 MG PO TABS: 5.0000 mg | ORAL_TABLET | ORAL | Status: DC | PRN

## 2014-12-16 MED ORDER — ONDANSETRON HCL 4 MG PO TABS: 4.0000 mg | ORAL_TABLET | Freq: Four times a day (QID) | ORAL | Status: DC | PRN

## 2014-12-16 MED ORDER — INFLUENZA VAC SPLIT QUAD 0.5 ML IM SUSY
0.5000 mL | PREFILLED_SYRINGE | INTRAMUSCULAR | Status: AC
Start: 2014-12-17 — End: 2014-12-17
  Administered 2014-12-17: 0.5 mL via INTRAMUSCULAR
  Filled 2014-12-16: qty 0.5

## 2014-12-16 MED ORDER — GABAPENTIN 300 MG PO CAPS
300.0000 mg | ORAL_CAPSULE | Freq: Every day | ORAL | Status: DC
  Administered 2014-12-16 – 2014-12-18 (×3): 300 mg via ORAL
  Filled 2014-12-16 (×3): qty 1

## 2014-12-16 MED ORDER — ACETAMINOPHEN 650 MG RE SUPP: 650.0000 mg | Freq: Four times a day (QID) | RECTAL | Status: DC | PRN

## 2014-12-16 MED ORDER — METOPROLOL SUCCINATE ER 25 MG PO TB24
25.0000 mg | ORAL_TABLET | Freq: Every day | ORAL | Status: DC
Start: 2014-12-16 — End: 2014-12-17
  Administered 2014-12-16 – 2014-12-17 (×2): 25 mg via ORAL
  Filled 2014-12-16 (×2): qty 1

## 2014-12-16 MED ORDER — MORPHINE SULFATE (PF) 2 MG/ML IV SOLN: 2.0000 mg | INTRAVENOUS | Status: DC | PRN

## 2014-12-16 MED ORDER — ROPINIROLE HCL 1 MG PO TABS
2.0000 mg | ORAL_TABLET | Freq: Three times a day (TID) | ORAL | Status: DC
  Administered 2014-12-17 – 2014-12-19 (×8): 2 mg via ORAL
  Filled 2014-12-16 (×5): qty 2
  Filled 2014-12-16: qty 1
  Filled 2014-12-16: qty 2

## 2014-12-16 MED ORDER — ALPRAZOLAM 0.25 MG PO TABS
0.2500 mg | ORAL_TABLET | Freq: Every day | ORAL | Status: DC | PRN
  Administered 2014-12-17: 0.25 mg via ORAL
  Filled 2014-12-16: qty 1

## 2014-12-16 MED ORDER — SODIUM CHLORIDE 0.9 % IV SOLN
INTRAVENOUS | Status: DC
  Administered 2014-12-16 – 2014-12-18 (×4): via INTRAVENOUS

## 2014-12-16 MED ORDER — DICLOFENAC SODIUM 50 MG PO TBEC
50.0000 mg | DELAYED_RELEASE_TABLET | Freq: Two times a day (BID) | ORAL | Status: DC
  Administered 2014-12-17 (×2): 50 mg via ORAL
  Filled 2014-12-16 (×3): qty 1

## 2014-12-16 MED ORDER — ROSUVASTATIN CALCIUM 20 MG PO TABS
20.0000 mg | ORAL_TABLET | Freq: Every day | ORAL | Status: DC
  Administered 2014-12-16 – 2014-12-19 (×4): 20 mg via ORAL
  Filled 2014-12-16 (×4): qty 1

## 2014-12-16 MED ORDER — SODIUM CHLORIDE 0.9 % IJ SOLN
3.0000 mL | Freq: Two times a day (BID) | INTRAMUSCULAR | Status: DC
  Administered 2014-12-17 – 2014-12-19 (×4): 3 mL via INTRAVENOUS

## 2014-12-16 MED ORDER — ACETAMINOPHEN 325 MG PO TABS: 650.0000 mg | ORAL_TABLET | Freq: Four times a day (QID) | ORAL | Status: DC | PRN

## 2014-12-16 MED ORDER — OXYBUTYNIN CHLORIDE ER 10 MG PO TB24
10.0000 mg | ORAL_TABLET | Freq: Every day | ORAL | Status: DC
  Administered 2014-12-16 – 2014-12-18 (×3): 10 mg via ORAL
  Filled 2014-12-16 (×4): qty 1

## 2014-12-16 MED ORDER — ASPIRIN EC 81 MG PO TBEC
81.0000 mg | DELAYED_RELEASE_TABLET | Freq: Every day | ORAL | Status: DC
  Administered 2014-12-17 – 2014-12-19 (×3): 81 mg via ORAL
  Filled 2014-12-16 (×3): qty 1

## 2014-12-16 MED ORDER — ONDANSETRON HCL 4 MG/2ML IJ SOLN: 4.0000 mg | Freq: Four times a day (QID) | INTRAMUSCULAR | Status: DC | PRN

## 2014-12-16 MED ORDER — PANTOPRAZOLE SODIUM 40 MG PO TBEC
40.0000 mg | DELAYED_RELEASE_TABLET | Freq: Two times a day (BID) | ORAL | Status: DC
  Administered 2014-12-16 – 2014-12-19 (×6): 40 mg via ORAL
  Filled 2014-12-16 (×6): qty 1

## 2014-12-16 MED ORDER — CLOPIDOGREL BISULFATE 75 MG PO TABS
75.0000 mg | ORAL_TABLET | Freq: Every day | ORAL | Status: DC
  Administered 2014-12-16 – 2014-12-19 (×4): 75 mg via ORAL
  Filled 2014-12-16 (×4): qty 1

## 2014-12-16 MED ORDER — MEMANTINE HCL ER 14 MG PO CP24
28.0000 mg | ORAL_CAPSULE | Freq: Every day | ORAL | Status: DC
  Administered 2014-12-17 – 2014-12-19 (×4): 28 mg via ORAL
  Filled 2014-12-16 (×2): qty 1
  Filled 2014-12-16: qty 2
  Filled 2014-12-16 (×2): qty 1

## 2014-12-16 MED ORDER — ROPINIROLE HCL 0.25 MG PO TABS
0.5000 mg | ORAL_TABLET | Freq: Every day | ORAL | Status: DC
  Administered 2014-12-17 – 2014-12-18 (×3): 0.5 mg via ORAL
  Filled 2014-12-16 (×2): qty 1
  Filled 2014-12-16: qty 2
  Filled 2014-12-16: qty 1

## 2014-12-16 MED ORDER — ASPIRIN 81 MG PO CHEW
324.0000 mg | CHEWABLE_TABLET | Freq: Once | ORAL | Status: AC
  Administered 2014-12-16: 324 mg via ORAL
  Filled 2014-12-16: qty 4

## 2014-12-16 NOTE — H&P (Signed)
[facility] NAME: Amanda Hogan    MR#:  [id number]  DATE OF BIRTH:  01-28-1929   DATE OF ADMISSION:  12/16/2014  PRIMARY CARE PHYSICIAN: Delia Chimes, NP   REQUESTING/REFERRING PHYSICIAN: Joni Fears  CHIEF COMPLAINT:   Chief Complaint  Patient presents with  . Altered Mental Status    HISTORY OF PRESENT ILLNESS:  Amanda Hogan  is a 81 y.o. female with a known history of dementia, essential hypertension presenting with right-sided weakness. Patient unable to provide meaningful history given mental status/medical condition. History obtained from family members present at bedside. They state that they noted she is more lethargic and fused with associated right-sided weakness manifesting as difficulty walking which occurred approximately 4 days ago given persistence of symptoms decided to present to the Hospital further workup and evaluation. Emergency Department course found to have a stroke  PAST MEDICAL HISTORY:   Past Medical History  Diagnosis Date  . Hypertension   . Arthritis   . High cholesterol   . Acid reflux   . TIA (transient ischemic attack)   . Restless leg syndrome   . Complication of anesthesia     "w/my back; gave me too much; thought I'd had stroke but didn't"  . Heart murmur   . Stroke     "said it looked like bullet holes; had a bunch"  . Angina   . Exertional dyspnea   . Sinus malignant neoplasm   . Fibromyalgia   . Memory disorder, possibly not taking home meds at times 07/22/2011  . Anxiety 02/23/2012  . Dementia 02/23/2012  . Kidney stone   . HTN (hypertension)   . Mild carotid artery disease 08/05/11    carotid dopplers, mild disease  . Chest pain     negative lexiscan myoview 53/13, last echo 02/24/12-EF 35-361, mid systolic obliteration of the LV, cavity aortic sclerosis    PAST SURGICAL HISTORY:   Past Surgical History  Procedure Laterality Date  . Cholecystectomy  2005  .  Vaginal hysterectomy    . Breast cyst excision      left  . Hernia repair  2007    "stomach"  . Lung surgery      "made 3 holes and pulled gel out"  . Knee cartilage surgery      right  . Lumbar spine surgery      "put box in my lower back"  . Elbow bursa surgery      left  . Cataract extraction w/ intraocular lens  implant, bilateral    . Eye surgery  1940    fixed my crossed eyes"  . Kidney stone surgery      "right side; cut it out"  . Carpal tunnel release      right    SOCIAL HISTORY:   Social History  Substance Use Topics  . Smoking status: Never Smoker   . Smokeless tobacco: Never Used  . Alcohol Use: No    FAMILY HISTORY:   Family History  Problem Relation Age of Onset  . Diabetes Neg Hx     DRUG ALLERGIES:   Allergies  Allergen Reactions  . Iohexol Shortness Of Breath and Itching  . Contrast Media [Iodinated Diagnostic Agents] Other (See Comments)    Reaction:  Unknown     REVIEW OF SYSTEMS:  Unable to accurately obtain given patient's mental status/medical condition   MEDICATIONS AT HOME:   Prior to Admission medications   Medication Sig  Start Date End Date Taking? Authorizing Provider  acetaminophen (TYLENOL) 500 MG tablet Take 1,000 mg by mouth every 6 (six) hours as needed for mild pain or headache.    Yes Historical Provider, MD  ALPRAZolam Duanne Moron) 0.25 MG tablet Take 0.25 mg by mouth daily as needed for anxiety.   Yes Historical Provider, MD  amLODipine (NORVASC) 10 MG tablet Take 10 mg by mouth daily.   Yes Historical Provider, MD  celecoxib (CELEBREX) 200 MG capsule Take 200 mg by mouth daily.   Yes Historical Provider, MD  citalopram (CELEXA) 20 MG tablet Take 20 mg by mouth daily.   Yes Historical Provider, MD  clopidogrel (PLAVIX) 75 MG tablet Take 75 mg by mouth daily.   Yes Historical Provider, MD  diclofenac (VOLTAREN) 50 MG EC tablet Take 50 mg by mouth 2 (two) times daily.   Yes Historical Provider, MD  furosemide (LASIX) 20 MG  tablet Take 20 mg by mouth daily.   Yes Historical Provider, MD  gabapentin (NEURONTIN) 300 MG capsule Take 300 mg by mouth at bedtime.   Yes Historical Provider, MD  memantine (NAMENDA XR) 28 MG CP24 24 hr capsule Take 28 mg by mouth daily.   Yes Historical Provider, MD  metoprolol succinate (TOPROL-XL) 25 MG 24 hr tablet Take 25 mg by mouth daily.   Yes Historical Provider, MD  nitroGLYCERIN (NITROSTAT) 0.4 MG SL tablet Place 0.4 mg under the tongue every 5 (five) minutes as needed for chest pain.    Yes Historical Provider, MD  pantoprazole (PROTONIX) 40 MG tablet Take 40 mg by mouth 2 (two) times daily.    Yes Historical Provider, MD  rOPINIRole (REQUIP) 0.5 MG tablet Take 0.5 mg by mouth at bedtime.   Yes Historical Provider, MD  rOPINIRole (REQUIP) 2 MG tablet Take 2 mg by mouth 3 (three) times daily.    Yes Historical Provider, MD  rosuvastatin (CRESTOR) 20 MG tablet Take 20 mg by mouth daily.   Yes Historical Provider, MD  spironolactone (ALDACTONE) 25 MG tablet Take 25 mg by mouth daily.   Yes Historical Provider, MD  tolterodine (DETROL) 2 MG tablet Take 2 mg by mouth 2 (two) times daily.   Yes Historical Provider, MD  trimethoprim (TRIMPEX) 100 MG tablet Take 100 mg by mouth at bedtime.   Yes Historical Provider, MD      VITAL SIGNS:  Blood pressure 112/64, pulse 63, temperature 98.4 F (36.9 C), temperature source Oral, resp. rate 16, weight 188 lb 15 oz (85.7 kg), SpO2 97 %.  PHYSICAL EXAMINATION:  VITAL SIGNS: Filed Vitals:   12/16/14 2100  BP: 112/64  Pulse: 63  Temp:   Resp: 29   GENERAL:81 y.o.female currently in no acute distress.  HEAD: Normocephalic, atraumatic.  EYES: Pupils equal, round, reactive to light. Extraocular muscles intact. No scleral icterus.  MOUTH: Moist mucosal membrane. Dentition intact. No abscess noted.  EAR, NOSE, THROAT: Clear without exudates. No external lesions.  NECK: Supple. No thyromegaly. No nodules. No JVD.  PULMONARY: Clear to  ascultation, without wheeze rails or rhonci. No use of accessory muscles, Good respiratory effort. good air entry bilaterally CHEST: Nontender to palpation.  CARDIOVASCULAR: S1 and S2. Regular rate and rhythm. No murmurs, rubs, or gallops. No edema. Pedal pulses 2+ bilaterally.  GASTROINTESTINAL: Soft, nontender, nondistended. No masses. Positive bowel sounds. No hepatosplenomegaly.  MUSCULOSKELETAL: No swelling, clubbing, or edema. Range of motion full in all extremities.  NEUROLOGIC: She has some difficulty following commands which makes examination somewhat  difficult Cranial nerves II through XII are intact. Right lower extremity weakness strength 4/5 mainly proximal flexion other strength 5/5 SKIN: No ulceration, lesions, rashes, or cyanosis. Skin warm and dry. Turgor intact.  PSYCHIATRIC: Mood, affect flattened. The patient is awake, alert and oriented to self. Insight, judgment poor.    LABORATORY PANEL:   CBC  Recent Labs Lab 12/16/14 1541  WBC 7.4  HGB 11.8*  HCT 35.4  PLT 323   ------------------------------------------------------------------------------------------------------------------  Chemistries   Recent Labs Lab 12/16/14 1541  NA 130*  K 4.8  CL 99*  CO2 23  GLUCOSE 125*  BUN 31*  CREATININE 1.71*  CALCIUM 9.3   ------------------------------------------------------------------------------------------------------------------  Cardiac Enzymes No results for input(s): TROPONINI in the last 168 hours. ------------------------------------------------------------------------------------------------------------------  RADIOLOGY:  Ct Head Wo Contrast  12/16/2014   CLINICAL DATA:  Right lower extremity weakness for 4 days. Confusion. Fatigue.  EXAM: CT HEAD WITHOUT CONTRAST  TECHNIQUE: Contiguous axial images were obtained from the base of the skull through the vertex without intravenous contrast.  COMPARISON:  Brain MRI 02/23/2012  FINDINGS: Extensive chronic  small vessel ischemic disease is again seen throughout the cerebral white matter bilaterally. No definite acute large territory infarct is identified. There is an approximately 2 cm focus of low density in the left basal ganglia region involving the caudate and lentiform nuclei is as well as internal capsule which is new and consistent with a chronic or possibly subacute infarct.  Chronic right MCA territory infarcts are again seen involving the frontal and temporal lobes. There is no evidence of acute intracranial hemorrhage, mass, midline shift, or extra-axial fluid collection. There is mild global cerebral atrophy. There is no hydrocephalus.  Visualized orbits are unremarkable. Visualized paranasal sinuses and mastoid air cells are clear. Moderate calcified atherosclerosis is noted at the skullbase. No skull fracture is seen.  IMPRESSION: 1. No acute intracranial hemorrhage or evidence of acute large territory infarct. 2. New left basal ganglia infarct which appears chronic or possibly subacute. 3. Extensive chronic small vessel ischemic disease and chronic infarcts.   Electronically Signed   By: Logan Bores M.D.   On: 12/16/2014 16:12    EKG:   Orders placed or performed during the hospital encounter of 12/16/14  . EKG 12-Lead  . EKG 12-Lead  . ED EKG  . ED EKG    IMPRESSION AND PLAN:   81 year old Caucasian female history of dementia presenting with right-sided weakness  1. Left basal ganglia stroke: Aspirin, statin, Plavix consult neurology place on telemetry MRI lipid panel hemoglobin A1c 2. Essential hypertension: Permissive hypertension treated blood pressure only greater than 220/120 3. Hyponatremia: IV fluid hydration normal saline follow sodium level IV. GERD without esophagitis: PPI therapy 5. Venous thrombi embolism prophylactic: SCDs     All the records are reviewed and case discussed with ED provider. Management plans discussed with the patient, family and they are in  agreement.  CODE STATUS: Full  TOTAL TIME TAKING CARE OF THIS PATIENT: 35 minutes.    Hower,  Karenann Cai.D on 12/16/2014 at 9:03 PM  Between 7am to 6pm - Pager - [phone]  After 6pm: House Pager: - [phone]  [facility]  Office  [phone]  CC: Primary care physician; Delia Chimes, NP

## 2014-12-16 NOTE — ED Notes (Signed)
Report attempted 

## 2014-12-16 NOTE — ED Notes (Addendum)
Pt presents to ED via EMS for confusion. Family report that pt has been less responsive and fatigue x4 days and that she has been dragging her right foot. Also reports frequent UTIs, just finished prescription for Cipro that was started on 12/10/2014 for UTI. Pt alerts and oriented x2 at this time. Per EMS, BP-132/66, HR-64, RR-16, CBG-125, O2-97% on room air.

## 2014-12-16 NOTE — ED Provider Notes (Signed)
[facility] Emergency Department Provider Note  ____________________________________________  Time seen: 3:15 PM  I have reviewed the triage vital signs and the nursing notes.   HISTORY  Chief Complaint Altered Mental Status  history Limited by dementia and confusion. Additional history obtained from son and daughter   HPI Amanda Hogan is a 81 y.o. female is brought to the ED by EMS at her family's request due to the patient being severely fatigued and less responsive than usual. They also report that she is speaking incoherently at home. She recently finished a course of Cipro for a urinary tract infection, which she has frequently. She also over last 4 days has been dragging her right leg and having difficulty walking at home. They state that she normally is able to ambulate without assistance with a shuffling gait, but now she is unable to walk at all. Denies any headache vision change chest pain shortness of breath back. Abdominal pain nausea vomiting diarrhea fever chills.  Was recently hospitalized 1 month ago for CHF and pulmonary edema as well as urinary tract infection.   Past Medical History  Diagnosis Date  . Hypertension   . Arthritis   . High cholesterol   . Acid reflux   . TIA (transient ischemic attack)   . Restless leg syndrome   . Complication of anesthesia     "w/my back; gave me too much; thought I'd had stroke but didn't"  . Heart murmur   . Stroke     "said it looked like bullet holes; had a bunch"  . Angina   . Exertional dyspnea   . Sinus malignant neoplasm   . Fibromyalgia   . Memory disorder, possibly not taking home meds at times 07/22/2011  . Anxiety 02/23/2012  . Dementia 02/23/2012  . Kidney stone   . HTN (hypertension)   . Mild carotid artery disease 08/05/11    carotid dopplers, mild disease  . Chest pain     negative lexiscan myoview 53/13, last echo 02/24/12-EF 44-315, mid systolic obliteration of the LV, cavity  aortic sclerosis     Patient Active Problem List   Diagnosis Date Noted  . Hypokalemia 10/29/2014  . Diastolic dysfunction-grade 2 10/29/2014  . Pulmonary hypertension-moderate 10/29/2014  . Troponin level elevated-suspect demand ischemia from AF with RVR 10/28/2014  . Lymphoma history 10/28/2014  . Acute diastolic CHF (congestive heart failure) 10/27/2014  . Atrial fibrillation with RVR (new)   . TIA (transient ischemic attack) 02/25/2012  . Encephalopathy acute 02/23/2012  . Anemia 02/23/2012  . Chronic back pain 02/23/2012  . Anxiety 02/23/2012  . Mild carotid artery disease 08/05/2011  . Memory disorder, possibly not taking home meds at times 07/22/2011  . Hypertension, at times poorly controlled 07/21/2011  . Chest pain-low risk Myoview May 2013 07/21/2011  . Hyperlipidemia 07/21/2011  . Gastroesophageal reflux disease 07/21/2011  . History of TIA (transient ischemic attack) 07/21/2011  . Obesity 07/21/2011     Past Surgical History  Procedure Laterality Date  . Cholecystectomy  2005  . Vaginal hysterectomy    . Breast cyst excision      left  . Hernia repair  2007    "stomach"  . Lung surgery      "made 3 holes and pulled gel out"  . Knee cartilage surgery      right  . Lumbar spine surgery      "put box in my lower back"  . Elbow bursa surgery  left  . Cataract extraction w/ intraocular lens  implant, bilateral    . Eye surgery  1940    fixed my crossed eyes"  . Kidney stone surgery      "right side; cut it out"  . Carpal tunnel release      right     Current Outpatient Rx  Name  Route  Sig  Dispense  Refill  . acetaminophen (TYLENOL) 500 MG tablet   Oral   Take 1,000 mg by mouth every 6 (six) hours as needed. For pain         . ALPRAZolam (XANAX) 0.25 MG tablet   Oral   Take 0.25 mg by mouth daily as needed for anxiety.         Marland Kitchen amLODipine (NORVASC) 10 MG tablet   Oral   Take 10 mg by mouth daily.         . citalopram (CELEXA) 20  MG tablet   Oral   Take 1 tablet (20 mg total) by mouth daily.   31 tablet   0   . clopidogrel (PLAVIX) 75 MG tablet   Oral   Take 75 mg by mouth daily.         Marland Kitchen gabapentin (NEURONTIN) 300 MG capsule   Oral   Take 300 mg by mouth at bedtime.         . memantine (NAMENDA XR) 28 MG CP24 24 hr capsule   Oral   Take 28 mg by mouth daily.         . nitroGLYCERIN (NITROSTAT) 0.4 MG SL tablet   Sublingual   Place 0.4 mg under the tongue every 5 (five) minutes as needed. For chest pain         . pantoprazole (PROTONIX) 40 MG tablet   Oral   Take 1 tablet (40 mg total) by mouth 2 (two) times daily before a meal.   62 tablet   0   . Probiotic Product ([employer] PROBIOTIC) CHEW   Oral   Chew 1 tablet by mouth daily.         Marland Kitchen rOPINIRole (REQUIP) 0.5 MG tablet   Oral   Take 0.5 mg by mouth at bedtime.         Marland Kitchen rOPINIRole (REQUIP) 2 MG tablet   Oral   Take 2 mg by mouth 3 (three) times daily as needed (restless legs).         . rosuvastatin (CRESTOR) 20 MG tablet   Oral   Take 20 mg by mouth daily.         Marland Kitchen spironolactone (ALDACTONE) 25 MG tablet   Oral   Take 25 mg by mouth daily.         Marland Kitchen tolterodine (DETROL) 2 MG tablet   Oral   Take 4 mg by mouth 2 (two) times daily.         Marland Kitchen triamcinolone cream (KENALOG) 0.1 %   Topical   Apply 1 application topically 2 (two) times daily.         Marland Kitchen trimethoprim (TRIMPEX) 100 MG tablet   Oral   Take 100 mg by mouth at bedtime.            Allergies Contrast media and Iohexol   History reviewed. No pertinent family history.  Social History Social History  Substance Use Topics  . Smoking status: Never Smoker   . Smokeless tobacco: Never Used  . Alcohol Use: No    Review of Systems  Constitutional:   No fever or chills. No weight changes Eyes:   No blurry vision or double vision.  ENT:   No sore throat. Cardiovascular:   No chest pain. Respiratory:   No dyspnea or cough. Gastrointestinal:    Negative for abdominal pain, vomiting and diarrhea.  No BRBPR or melena. Genitourinary:   Negative for dysuria, urinary retention, bloody urine, or difficulty urinating. Musculoskeletal:   Negative for back pain. No joint swelling or pain. Skin:   Negative for rash. Neurological:   Negative for headaches, positive right leg weakness Psychiatric:  No anxiety or depression.   Endocrine:  No hot/cold intolerance, changes in energy, or sleep difficulty.  10-point ROS otherwise negative.  ____________________________________________   PHYSICAL EXAM:  VITAL SIGNS: ED Triage Vitals  Enc Vitals Group     BP 12/16/14 1525 121/56 mmHg     Pulse Rate 12/16/14 1525 65     Resp 12/16/14 1525 18     Temp 12/16/14 1525 98.1 F (36.7 C)     Temp Source 12/16/14 1525 Oral     SpO2 12/16/14 1525 96 %     Weight 12/16/14 1523 188 lb 15 oz (85.7 kg)     Height --      Head Cir --      Peak Flow --      Pain Score 12/16/14 1524 0     Pain Loc --      Pain Edu? --      Excl. in [place]? --      Constitutional:   Alert and oriented to person and place. Well appearing and in no distress. Eyes:   No scleral icterus. No conjunctival pallor. PERRL. EOMI ENT   Head:   Normocephalic and atraumatic.   Nose:   No congestion/rhinnorhea. No septal hematoma   Mouth/Throat:   MMM, no pharyngeal erythema. No peritonsillar mass. No uvula shift.   Neck:   No stridor. No SubQ emphysema. No meningismus. Hematological/Lymphatic/Immunilogical:   No cervical lymphadenopathy. Cardiovascular:   RRR. Normal and symmetric distal pulses are present in all extremities. No murmurs, rubs, or gallops. Respiratory:   Normal respiratory effort without tachypnea nor retractions. Breath sounds are clear and equal bilaterally. No wheezes/rales/rhonchi. Gastrointestinal:   Soft and nontender. No distention. There is no CVA tenderness.  No rebound, rigidity, or guarding. Genitourinary:   deferred Musculoskeletal:    Nontender with normal range of motion in all extremities. No joint effusions.  No lower extremity tenderness.  No edema. Neurologic:   Normal speech and language.  CN 2-10 normal. Dorsiflexion and plantarflexion intact. Patient able to lift left leg off the bed, but unable to lift the right leg No pronator drift.   Able to stand in place with assistance, but when asked to walk unable to transfer her weight back and forth between feet or move the right leg forward. Unable to support her weight on the right leg No gross focal neurologic deficits are appreciated.  Skin:    Skin is warm, dry and intact. No rash noted.  No petechiae, purpura, or bullae. Psychiatric:   Mood and affect are normal. Speech and behavior are normal. Patient exhibits appropriate insight and judgment.  ____________________________________________    LABS (pertinent positives/negatives) (all labs ordered are listed, but only abnormal results are displayed) Labs Reviewed  BASIC METABOLIC PANEL - Abnormal; Notable for the following:    Sodium 130 (*)    Chloride 99 (*)    Glucose, Bld 125 (*)  BUN 31 (*)    Creatinine, Ser 1.71 (*)    GFR calc non Af Amer 26 (*)    GFR calc Af Amer 31 (*)    All other components within normal limits  CBC WITH DIFFERENTIAL/PLATELET - Abnormal; Notable for the following:    Hemoglobin 11.8 (*)    Basophils Absolute 0.2 (*)    All other components within normal limits  URINALYSIS COMPLETEWITH MICROSCOPIC ([facility] ONLY) - Abnormal; Notable for the following:    Color, Urine YELLOW (*)    APPearance CLEAR (*)    Hgb urine dipstick 3+ (*)    Squamous Epithelial / LPF 0-5 (*)    All other components within normal limits  URINE CULTURE   ____________________________________________   EKG  Interpreted by me  Date: 12/16/2014  Rate: 65  Rhythm: normal sinus rhythm  QRS Axis: normal  Intervals: normal  ST/T Wave abnormalities: normal  Conduction Disutrbances: none  Narrative  Interpretation: unremarkable      ____________________________________________    RADIOLOGY  CT head reveals a subacute stroke in the left basal ganglia and internal capsule which corresponds to the patient's right leg weakness  ____________________________________________   PROCEDURES   ____________________________________________   INITIAL IMPRESSION / ASSESSMENT AND PLAN / ED COURSE  Pertinent labs & imaging results that were available during my care of the patient were reviewed by me and considered in my medical decision making (see chart for details). ----------------------------------------- 5:05 PM on 12/16/2014 -----------------------------------------   Patient presents with acute on chronic confusion in the setting of recently UTI treatment with Cipro and right leg weakness. She is found to have a subacute stroke of the left basal ganglia and internal capsule which accounts for the right leg weakness. Due to local resistance to Cipro and the recent hospitalization it is also possible that she has an infection that is resistant to Cipro and has failed outpatient treatment for the urinary tract infection and now has delirium. We'll follow-up labs and plan for admission. Full dose aspirin given.  ----------------------------------------- 8:05 PM on 12/16/2014 -----------------------------------------  Workup significant for stroke, with labs and vital signs unremarkable. As the patient has ongoing neurologic symptoms, we will admit the hospital for further management. ____________________________________________   FINAL CLINICAL IMPRESSION(S) / ED DIAGNOSES  Final diagnoses:  Acute CVA (cerebrovascular accident)      Carrie Mew, MD 12/16/14 2006

## 2014-12-17 ENCOUNTER — Inpatient Hospital Stay: Payer: PPO

## 2014-12-17 DIAGNOSIS — I639 Cerebral infarction, unspecified: Secondary | ICD-10-CM | POA: Diagnosis not present

## 2014-12-17 LAB — BASIC METABOLIC PANEL
ANION GAP: 9 (ref 5–15)
BUN: 32 mg/dL — ABNORMAL HIGH (ref 6–20)
CO2: 23 mmol/L (ref 22–32)
Calcium: 8.7 mg/dL — ABNORMAL LOW (ref 8.9–10.3)
Chloride: 103 mmol/L (ref 101–111)
Creatinine, Ser: 1.69 mg/dL — ABNORMAL HIGH (ref 0.44–1.00)
GFR calc Af Amer: 31 mL/min — ABNORMAL LOW (ref >60)
GFR calc non Af Amer: 27 mL/min — ABNORMAL LOW (ref >60)
GLUCOSE: 90 mg/dL (ref 65–99)
POTASSIUM: 4.5 mmol/L (ref 3.5–5.1)
Sodium: 135 mmol/L (ref 135–145)

## 2014-12-17 LAB — CBC
HEMATOCRIT: 30 % — AB (ref 35.0–47.0)
HEMOGLOBIN: 10.4 g/dL — AB (ref 12.0–16.0)
MCH: 31.6 pg (ref 26.0–34.0)
MCHC: 34.5 g/dL (ref 32.0–36.0)
MCV: 91.6 fL (ref 80.0–100.0)
Platelets: 254 10*3/uL (ref 150–440)
RBC: 3.28 MIL/uL — ABNORMAL LOW (ref 3.80–5.20)
RDW: 14.2 % (ref 11.5–14.5)
WBC: 6.9 10*3/uL (ref 3.6–11.0)

## 2014-12-17 LAB — HEMOGLOBIN A1C: Hgb A1c MFr Bld: 5.8 % (ref 4.0–6.0)

## 2014-12-17 LAB — MRSA PCR SCREENING: MRSA BY PCR: POSITIVE — AB

## 2014-12-17 MED ORDER — CHLORHEXIDINE GLUCONATE CLOTH 2 % EX PADS
6.0000 | MEDICATED_PAD | Freq: Every day | CUTANEOUS | Status: DC
Start: 2014-12-17 — End: 2014-12-19
  Administered 2014-12-17 – 2014-12-19 (×3): 6 via TOPICAL

## 2014-12-17 MED ORDER — MUPIROCIN 2 % EX OINT
1.0000 "application " | TOPICAL_OINTMENT | Freq: Two times a day (BID) | CUTANEOUS | Status: DC
  Administered 2014-12-17 – 2014-12-19 (×5): 1 via NASAL
  Filled 2014-12-17: qty 22

## 2014-12-17 NOTE — Plan of Care (Signed)
Problem: Discharge/Transitional Outcomes Goal: Barriers To Progression Addressed/Resolved Outcome: Progressing Individualization of Care Pt prefers to be called Amanda Hogan Hx of HTN, arthritis, HLD, acid reflux, TIA, restless leg syndrome, heart murmur, stroke, fibromyalgia, anxiety, dementia, CAD, kidney stone, angina    Goal: Other Discharge Outcomes/Goals Outcome: Progressing Barriers:  Patient with confusion and dementia.  Education:  Education provided to patient and reinforced.  Stroke education booklet given to family. Hemodynamically:  Patient afebrile and VSS this shift. BP 105/50 mmHg  Pulse 58  Temp(Src) 98.7 F (37.1 C) (Oral)  Resp 18  Ht 5\' 5"  (1.651 m)  Wt 190 lb 8 oz (86.41 kg)  BMI 31.70 kg/m2  SpO2 95%  Mobility:  Patient in bed for majority of shift.  At assessment patient with substantial weakness and right leg drag.  She also has right side drift. Diet:  Patient did not eat this shift.  Swallow screen performed.  Patient able to swallow pills whole with water. Pain:  Patient without complaints of pain this shift. Discharge Plan:  Family states that they are willing and able to assist patient with physician appointments and medication needs at discharge.

## 2014-12-17 NOTE — Progress Notes (Signed)
Early at [place] NAME: Amanda Hogan    MR#:  [id number]  DATE OF BIRTH:  1928/10/01  SUBJECTIVE:  CHIEF COMPLAINT:   Chief Complaint  Patient presents with  . Altered Mental Status   Patient here due to altered mental status and also due to generalized weakness and difficulty walking. Daughter is at bedside. As per the daughter patient is still not back to baseline. Patient CT scan of the head and MRI brain confirmed a left-sided basal ganglia CVA.  REVIEW OF SYSTEMS:    Review of Systems  Unable to perform ROS  due to advanced dementia  Nutrition: Heart healthy Tolerating Diet: Yes Tolerating PT: Await evaluation    DRUG ALLERGIES:   Allergies  Allergen Reactions  . Iohexol Shortness Of Breath and Itching  . Contrast Media [Iodinated Diagnostic Agents] Other (See Comments)    Reaction:  Unknown     VITALS:  Blood pressure 120/50, pulse 57, temperature 97.6 F (36.4 C), temperature source Oral, resp. rate 18, height 5\' 5"  (1.651 m), weight 86.41 kg (190 lb 8 oz), SpO2 95 %.  PHYSICAL EXAMINATION:   Physical Exam  GENERAL:  81 y.o.-year-old patient lying in the bed with no acute distress.  EYES: Pupils equal, round, reactive to light and accommodation. No scleral icterus. Extraocular muscles intact.  HEENT: Head atraumatic, normocephalic. Oropharynx and nasopharynx clear.  NECK:  Supple, no jugular venous distention. No thyroid enlargement, no tenderness.  LUNGS: Normal breath sounds bilaterally, no wheezing, rales, rhonchi. No use of accessory muscles of respiration.  CARDIOVASCULAR: S1, S2 normal. 2/6 diastolic murmur at the base, No rubs, or gallops.  ABDOMEN: Soft, nontender, nondistended. Bowel sounds present. No organomegaly or mass.  EXTREMITIES: No cyanosis, clubbing or edema b/l.    NEUROLOGIC: Cranial nerves II through XII are intact. No focal Motor or sensory deficits b/l. Globally weak   PSYCHIATRIC: The patient is alert and oriented x 1.  SKIN: No obvious rash, lesion, or ulcer.    LABORATORY PANEL:   CBC  Recent Labs Lab 12/17/14 0443  WBC 6.9  HGB 10.4*  HCT 30.0*  PLT 254   ------------------------------------------------------------------------------------------------------------------  Chemistries   Recent Labs Lab 12/17/14 0443  NA 135  K 4.5  CL 103  CO2 23  GLUCOSE 90  BUN 32*  CREATININE 1.69*  CALCIUM 8.7*   ------------------------------------------------------------------------------------------------------------------  Cardiac Enzymes No results for input(s): TROPONINI in the last 168 hours. ------------------------------------------------------------------------------------------------------------------  RADIOLOGY:  Ct Head Wo Contrast  12/16/2014   CLINICAL DATA:  Right lower extremity weakness for 4 days. Confusion. Fatigue.  EXAM: CT HEAD WITHOUT CONTRAST  TECHNIQUE: Contiguous axial images were obtained from the base of the skull through the vertex without intravenous contrast.  COMPARISON:  Brain MRI 02/23/2012  FINDINGS: Extensive chronic small vessel ischemic disease is again seen throughout the cerebral white matter bilaterally. No definite acute large territory infarct is identified. There is an approximately 2 cm focus of low density in the left basal ganglia region involving the caudate and lentiform nuclei is as well as internal capsule which is new and consistent with a chronic or possibly subacute infarct.  Chronic right MCA territory infarcts are again seen involving the frontal and temporal lobes. There is no evidence of acute intracranial hemorrhage, mass, midline shift, or extra-axial fluid collection. There is mild global cerebral atrophy. There is no hydrocephalus.  Visualized orbits are unremarkable. Visualized paranasal sinuses and mastoid air cells are clear. Moderate calcified  atherosclerosis is noted at the skullbase.  No skull fracture is seen.  IMPRESSION: 1. No acute intracranial hemorrhage or evidence of acute large territory infarct. 2. New left basal ganglia infarct which appears chronic or possibly subacute. 3. Extensive chronic small vessel ischemic disease and chronic infarcts.   Electronically Signed   By: Logan Bores M.D.   On: 12/16/2014 16:12   Mr Brain Wo Contrast  12/17/2014   CLINICAL DATA:  81 year old female with right side weakness and difficulty walking for 4 days. Initial encounter.  EXAM: MRI HEAD WITHOUT CONTRAST  TECHNIQUE: Multiplanar, multiecho pulse sequences of the brain and surrounding structures were obtained without intravenous contrast.  COMPARISON:  Head CT without contrast 12/16/2014. [facility] Brain MRI 02/23/2012.  FINDINGS: 2 cm area of restricted diffusion in the left basal ganglia which corresponds to the CT finding yesterday. Associated T2 and FLAIR hyperintensity. No definite petechial hemorrhage. No significant mass effect. No other restricted diffusion. Major intracranial vascular flow voids are preserved.  Underlying chronic right MCA territory infarcts with encephalomalacia in the right temporal lobe and operculum. Superimposed Patchy and confluent cerebral white matter T2 and FLAIR hyperintensity. Comparatively mild T2 heterogeneity elsewhere in the deep gray matter nuclei. Small chronic lacunar infarcts in both cerebellar hemispheres.  No midline shift, mass effect, evidence of mass lesion, ventriculomegaly, extra-axial collection or acute intracranial hemorrhage. Cervicomedullary junction and pituitary are within normal limits. Degenerative disc and endplate changes at B5-Z0 with stable appearing mild spinal stenosis.  Minimal paranasal sinus mucosal thickening. Mastoids are clear. Negative orbit and scalp soft tissues.  IMPRESSION: 1. Acute to subacute left basal ganglia infarct with no hemorrhage or mass effect. 2. Chronic ischemic disease otherwise stable since 2013.    Electronically Signed   By: Genevie Ann M.D.   On: 12/17/2014 09:17     ASSESSMENT AND PLAN:   81 year old female with past medical history of dementia, hypertension, osteoarthritis, hyperlipidemia, history of previous TIA, restless leg syndrome, fibromyalgia, who presented to the hospital due to altered mental status and noted to have a CT scan findings suggestive of a basal ganglia stroke.  #1 acute/subacute CVA-this was confirmed on CT scan also MRI of the brain. Patient although does not have any focal symptoms other than altered mental status and generalized weakness. -Continue aspirin, Plavix, statin. -We'll await further input from neurology regarding change in management. -Await physical therapy evaluation  #2 altered mental status-etiology unclear. Multifactorial likely. -Likely related to underlying CVA, dementia.  We'll follow mental status.  #3 dementia-continue Namenda.  #4 hypertension-hemodynamically stable. -Continue metoprolol  #5 history of depression-continue Celexa.  #6 anxiety-continue Xanax  #7 restless leg syndrome-continue Requip.   All the records are reviewed and case discussed with Care Management/Social Workerr. Management plans discussed with the patient, family and they are in agreement.  CODE STATUS: Full  DVT Prophylaxis: Teds and SCDs  TOTAL TIME TAKING CARE OF THIS PATIENT: 30 minutes.   POSSIBLE D/C IN 1-2 DAYS, DEPENDING ON CLINICAL CONDITION.   Henreitta Leber M.D on 12/17/2014 at 2:20 PM  Between 7am to 6pm - Pager - [phone]  After 6pm go to [email or web address] - password [email or web address] [facility]  [facility]  Office  [phone]  CC: Primary care physician; Delia Chimes, NP

## 2014-12-17 NOTE — Progress Notes (Signed)
PT Cancellation Note  Patient Details Name: ELLESSE ANTENUCCI MRN: [id number] DOB: [birth year]   Cancelled Treatment:    Reason Eval/Treat Not Completed: Patient at procedure or test/unavailable (Pt off floor for imaging).  Will re-attempt PT eval later today as able.   Raquel Sarna Hauschild 12/17/2014, 11:46 AM Leitha Bleak, [place]

## 2014-12-17 NOTE — Plan of Care (Signed)
Problem: Discharge/Transitional Outcomes Goal: Barriers To Progression Addressed/Resolved Individualization of Care Pt prefers to be called Mrs. Mountz Hx of HTN, arthritis, hypercholesterolemia, acid reflux, TIA, restless leg, CVA, heart murmur, angina, sinus malignant neoplasm, fibromyalgia, anxiety, dementia, CAD, chest pain on home meds HIGH Fall precautions Goal: Other Discharge Outcomes/Goals Pt worked with PT and tolerated moderately well, pt swollowing without difficulty, family at bedside, pt without c/o pain, NIHSS of 5, no changes in neuro status today.

## 2014-12-17 NOTE — Consult Note (Signed)
Reason for Consult: stroke Referring Physician:  Dr. Luiz Iron Amanda Hogan is an 81 y.o. female.  HPI: seen at request of Dr. Verdell Carmine for stroke;  81 yo RHD F presents to [facility] from home due to increased confusion and had some R sided weakness.  All of these symptoms started on last Thursday.  Before pt lived alone and was mostly dependent of all ADLs even though she had some dementia.  Today her strength has improved but there is still some confusion per family.  She did walk better with PT.  Past Medical History  Diagnosis Date  . Hypertension   . Arthritis   . High cholesterol   . Acid reflux   . TIA (transient ischemic attack)   . Restless leg syndrome   . Complication of anesthesia     "w/my back; gave me too much; thought I'd had stroke but didn't"  . Heart murmur   . Stroke     "said it looked like bullet holes; had a bunch"  . Angina   . Exertional dyspnea   . Sinus malignant neoplasm   . Fibromyalgia   . Memory disorder, possibly not taking home meds at times 07/22/2011  . Anxiety 02/23/2012  . Dementia 02/23/2012  . Kidney stone   . HTN (hypertension)   . Mild carotid artery disease 08/05/11    carotid dopplers, mild disease  . Chest pain     negative lexiscan myoview 53/13, last echo 02/24/12-EF 85-277, mid systolic obliteration of the LV, cavity aortic sclerosis    Past Surgical History  Procedure Laterality Date  . Cholecystectomy  2005  . Vaginal hysterectomy    . Breast cyst excision      left  . Hernia repair  2007    "stomach"  . Lung surgery      "made 3 holes and pulled gel out"  . Knee cartilage surgery      right  . Lumbar spine surgery      "put box in my lower back"  . Elbow bursa surgery      left  . Cataract extraction w/ intraocular lens  implant, bilateral    . Eye surgery  1940    fixed my crossed eyes"  . Kidney stone surgery      "right side; cut it out"  . Carpal tunnel release      right    Family History  Problem Relation Age  of Onset  . Diabetes Neg Hx     Social History:  reports that she has never smoked. She has never used smokeless tobacco. She reports that she does not drink alcohol or use illicit drugs.  Allergies:  Allergies  Allergen Reactions  . Iohexol Shortness Of Breath and Itching  . Contrast Media [Iodinated Diagnostic Agents] Other (See Comments)    Reaction:  Unknown     Medications:  Personally reviewed by me  Results for orders placed or performed during the hospital encounter of 12/16/14 (from the past 48 hour(s))  Basic metabolic panel     Status: Abnormal   Collection Time: 12/16/14  3:41 PM  Result Value Ref Range   Sodium 130 (L) 135 - 145 mmol/L   Potassium 4.8 3.5 - 5.1 mmol/L    Comment: HEMOLYSIS AT THIS LEVEL MAY AFFECT RESULT   Chloride 99 (L) 101 - 111 mmol/L   CO2 23 22 - 32 mmol/L   Glucose, Bld 125 (H) 65 - 99 mg/dL   BUN 31 (  H) 6 - 20 mg/dL   Creatinine, Ser 1.71 (H) 0.44 - 1.00 mg/dL   Calcium 9.3 8.9 - 10.3 mg/dL   GFR calc non Af Amer 26 (L) >60 mL/min   GFR calc Af Amer 31 (L) >60 mL/min    Comment: (NOTE) The eGFR has been calculated using the CKD EPI equation. This calculation has not been validated in all clinical situations. eGFR's persistently <60 mL/min signify possible Chronic Kidney Disease.    Anion gap 8 5 - 15  CBC with Differential     Status: Abnormal   Collection Time: 12/16/14  3:41 PM  Result Value Ref Range   WBC 7.4 3.6 - 11.0 K/uL   RBC 3.89 3.80 - 5.20 MIL/uL   Hemoglobin 11.8 (L) 12.0 - 16.0 g/dL   HCT 35.4 35.0 - 47.0 %   MCV 91.1 80.0 - 100.0 fL   MCH 30.4 26.0 - 34.0 pg   MCHC 33.4 32.0 - 36.0 g/dL   RDW 14.1 11.5 - 14.5 %   Platelets 323 150 - 440 K/uL   Neutrophils Relative % 69 %   Neutro Abs 5.1 1.4 - 6.5 K/uL   Lymphocytes Relative 15 %   Lymphs Abs 1.1 1.0 - 3.6 K/uL   Monocytes Relative 6 %   Monocytes Absolute 0.4 0.2 - 0.9 K/uL   Eosinophils Relative 8 %   Eosinophils Absolute 0.6 0 - 0.7 K/uL   Basophils  Relative 2 %   Basophils Absolute 0.2 (H) 0 - 0.1 K/uL  Lipid panel     Status: Abnormal   Collection Time: 12/16/14  3:41 PM  Result Value Ref Range   Cholesterol 114 0 - 200 mg/dL   Triglycerides 207 (H) <150 mg/dL   HDL 34 (L) >40 mg/dL   Total CHOL/HDL Ratio 3.4 RATIO   VLDL 41 (H) 0 - 40 mg/dL   LDL Cholesterol 39 0 - 99 mg/dL    Comment:        Total Cholesterol/HDL:CHD Risk Coronary Heart Disease Risk Table                     Men   Women  1/2 Average Risk   3.4   3.3  Average Risk       5.0   4.4  2 X Average Risk   9.6   7.1  3 X Average Risk  23.4   11.0        Use the calculated Patient Ratio above and the CHD Risk Table to determine the patient's CHD Risk.        ATP III CLASSIFICATION (LDL):  <100     mg/dL   Optimal  100-129  mg/dL   Near or Above                    Optimal  130-159  mg/dL   Borderline  160-189  mg/dL   High  >190     mg/dL   Very High   Hemoglobin A1c     Status: None   Collection Time: 12/16/14  3:41 PM  Result Value Ref Range   Hgb A1c MFr Bld 5.8 4.0 - 6.0 %  Urine culture     Status: None (Preliminary result)   Collection Time: 12/16/14  6:31 PM  Result Value Ref Range   Specimen Description URINE, CLEAN CATCH    Special Requests NONE    Culture TOO YOUNG TO READ    Report Status  PENDING   Urinalysis complete, with microscopic     Status: Abnormal   Collection Time: 12/16/14  6:39 PM  Result Value Ref Range   Color, Urine YELLOW (A) YELLOW   APPearance CLEAR (A) CLEAR   Glucose, UA NEGATIVE NEGATIVE mg/dL   Bilirubin Urine NEGATIVE NEGATIVE   Ketones, ur NEGATIVE NEGATIVE mg/dL   Specific Gravity, Urine 1.014 1.005 - 1.030   Hgb urine dipstick 3+ (A) NEGATIVE   pH 5.0 5.0 - 8.0   Protein, ur NEGATIVE NEGATIVE mg/dL   Nitrite NEGATIVE NEGATIVE   Leukocytes, UA NEGATIVE NEGATIVE   RBC / HPF 6-30 0 - 5 RBC/hpf   WBC, UA 0-5 0 - 5 WBC/hpf   Bacteria, UA NONE SEEN NONE SEEN   Squamous Epithelial / LPF 0-5 (A) NONE SEEN    Mucous PRESENT   MRSA PCR Screening     Status: Abnormal   Collection Time: 12/17/14 12:23 AM  Result Value Ref Range   MRSA by PCR POSITIVE (A) NEGATIVE    Comment: C/ CAROLYN SONBAY $RemoveBefore'@[id number]$  12/17/14.Marland KitchenMarland KitchenAJO        The GeneXpert MRSA Assay (FDA approved for NASAL specimens only), is one component of a comprehensive MRSA colonization surveillance program. It is not intended to diagnose MRSA infection nor to guide or monitor treatment for MRSA infections.   Basic metabolic panel     Status: Abnormal   Collection Time: 12/17/14  4:43 AM  Result Value Ref Range   Sodium 135 135 - 145 mmol/L   Potassium 4.5 3.5 - 5.1 mmol/L   Chloride 103 101 - 111 mmol/L   CO2 23 22 - 32 mmol/L   Glucose, Bld 90 65 - 99 mg/dL   BUN 32 (H) 6 - 20 mg/dL   Creatinine, Ser 1.69 (H) 0.44 - 1.00 mg/dL   Calcium 8.7 (L) 8.9 - 10.3 mg/dL   GFR calc non Af Amer 27 (L) >60 mL/min   GFR calc Af Amer 31 (L) >60 mL/min    Comment: (NOTE) The eGFR has been calculated using the CKD EPI equation. This calculation has not been validated in all clinical situations. eGFR's persistently <60 mL/min signify possible Chronic Kidney Disease.    Anion gap 9 5 - 15  CBC     Status: Abnormal   Collection Time: 12/17/14  4:43 AM  Result Value Ref Range   WBC 6.9 3.6 - 11.0 K/uL   RBC 3.28 (L) 3.80 - 5.20 MIL/uL   Hemoglobin 10.4 (L) 12.0 - 16.0 g/dL   HCT 30.0 (L) 35.0 - 47.0 %   MCV 91.6 80.0 - 100.0 fL   MCH 31.6 26.0 - 34.0 pg   MCHC 34.5 32.0 - 36.0 g/dL   RDW 14.2 11.5 - 14.5 %   Platelets 254 150 - 440 K/uL    Ct Head Wo Contrast  12/16/2014   CLINICAL DATA:  Right lower extremity weakness for 4 days. Confusion. Fatigue.  EXAM: CT HEAD WITHOUT CONTRAST  TECHNIQUE: Contiguous axial images were obtained from the base of the skull through the vertex without intravenous contrast.  COMPARISON:  Brain MRI 02/23/2012  FINDINGS: Extensive chronic small vessel ischemic disease is again seen throughout the cerebral white  matter bilaterally. No definite acute large territory infarct is identified. There is an approximately 2 cm focus of low density in the left basal ganglia region involving the caudate and lentiform nuclei is as well as internal capsule which is new and consistent with a chronic or possibly subacute infarct.  Chronic right MCA territory infarcts are again seen involving the frontal and temporal lobes. There is no evidence of acute intracranial hemorrhage, mass, midline shift, or extra-axial fluid collection. There is mild global cerebral atrophy. There is no hydrocephalus.  Visualized orbits are unremarkable. Visualized paranasal sinuses and mastoid air cells are clear. Moderate calcified atherosclerosis is noted at the skullbase. No skull fracture is seen.  IMPRESSION: 1. No acute intracranial hemorrhage or evidence of acute large territory infarct. 2. New left basal ganglia infarct which appears chronic or possibly subacute. 3. Extensive chronic small vessel ischemic disease and chronic infarcts.   Electronically Signed   By: Logan Bores M.D.   On: 12/16/2014 16:12   Mr Brain Wo Contrast  12/17/2014   CLINICAL DATA:  81 year old female with right side weakness and difficulty walking for 4 days. Initial encounter.  EXAM: MRI HEAD WITHOUT CONTRAST  TECHNIQUE: Multiplanar, multiecho pulse sequences of the brain and surrounding structures were obtained without intravenous contrast.  COMPARISON:  Head CT without contrast 12/16/2014. [facility] Brain MRI 02/23/2012.  FINDINGS: 2 cm area of restricted diffusion in the left basal ganglia which corresponds to the CT finding yesterday. Associated T2 and FLAIR hyperintensity. No definite petechial hemorrhage. No significant mass effect. No other restricted diffusion. Major intracranial vascular flow voids are preserved.  Underlying chronic right MCA territory infarcts with encephalomalacia in the right temporal lobe and operculum. Superimposed Patchy and  confluent cerebral white matter T2 and FLAIR hyperintensity. Comparatively mild T2 heterogeneity elsewhere in the deep gray matter nuclei. Small chronic lacunar infarcts in both cerebellar hemispheres.  No midline shift, mass effect, evidence of mass lesion, ventriculomegaly, extra-axial collection or acute intracranial hemorrhage. Cervicomedullary junction and pituitary are within normal limits. Degenerative disc and endplate changes at G2-I9 with stable appearing mild spinal stenosis.  Minimal paranasal sinus mucosal thickening. Mastoids are clear. Negative orbit and scalp soft tissues.  IMPRESSION: 1. Acute to subacute left basal ganglia infarct with no hemorrhage or mass effect. 2. Chronic ischemic disease otherwise stable since 2013.   Electronically Signed   By: Genevie Ann M.D.   On: 12/17/2014 09:17    Review of Systems  Unable to perform ROS: dementia   Blood pressure 123/50, pulse 59, temperature 97.7 F (36.5 C), temperature source Oral, resp. rate 18, height $RemoveBe'5\' 5"'XLfEWGYGX$  (1.651 m), weight 86.41 kg (190 lb 8 oz), SpO2 94 %. Physical Exam  Nursing note and vitals reviewed. Constitutional: She appears well-developed and well-nourished. No distress.  HENT:  Head: Normocephalic and atraumatic.  Right Ear: External ear normal.  Left Ear: External ear normal.  Nose: Nose normal.  Mouth/Throat: Oropharynx is clear and moist.  Eyes: Conjunctivae and EOM are normal. Pupils are equal, round, and reactive to light. No scleral icterus.  Neck: Normal range of motion. Neck supple.  Cardiovascular: Normal rate, regular rhythm, normal heart sounds and intact distal pulses.   Respiratory: Effort normal and breath sounds normal. She has no wheezes.  GI: Soft. Bowel sounds are normal. She exhibits no distension.  Musculoskeletal: Normal range of motion. She exhibits no edema.  Neurological:  Alert but oriented only to person, good naming and repetition, mild dysarthria PERRLA, EOMI, nl VF to threat, mild R  droop, tongue midline 5-/5 B, nl tone 1+/4 B, mute plantars B Some R neglect, nl temp  Skin: She is not diaphoretic.   MRI of brain personally reviewed by me and shows a L BG acute infarct, there is a large R embolic infarct  and severe white matter changes  Assessment/Plan: 1.  Acute L BG infarct-  There is concern for embolic phenomena as cause of this not small vessel disease and this is why pt is probably failing plavix and ASA.  Concerned now for occlusion of M1 section on L which can cause some further fluctuation in R sided weakness and some speech problems.  Suspect silent atrial fibrillation as cause. 2.  Encephalopathy-  This is likely from hyponatremia and UTI, will likely improve 3.  Dementia-  Looks to be multi-infarct from MRI, had previous reasonable baseline -  MRA of head and neck w/o contrast to r/o occlusion -  Needs loop recorder for 90 days -  Continue ASA, plavix and statin -  Will need SW for placement -  Will follow results  SMITH, MATTHEW 12/17/2014, 6:25 PM

## 2014-12-17 NOTE — Clinical Documentation Improvement (Signed)
Hospitalist  Abnormal Lab/Test Results:   Component BUN Creatinine  Latest Ref Rng 6 - 20 mg/dL 0.44 - 1.00 mg/dL  12/16/2014 31 (H) 1.71 (H)  12/17/2014 32 (H) 1.69 (H)   Component EGFR (Non-African Amer.)  Latest Ref Rng >60 mL/min  12/16/2014 26 (L)  12/17/2014 27 (L)   Possible Clinical Conditions associated with below indicators  Acute kidney injury (acute renal failure) -(if present, please specify cause if known)  Acute kidney injury on chronic kidney disease (if present, please specify stage of ckd, cause of aki if known)  Chronic kidney disease (if present, please specify stage)  Other Condition  Cannot Clinically Determine   Please exercise your independent, professional judgment when responding. A specific answer is not anticipated or expected.   Thank you, Mateo Flow, RN [phone] Clinical Documentation Specialist

## 2014-12-17 NOTE — Evaluation (Signed)
Physical Therapy Evaluation Patient Details Name: Amanda Hogan MRN: [id number] DOB: 29-Mar-1928 Today's Date: 12/17/2014   History of Present Illness  Amanda Hogan is a 81 y.o. female with a known history of dementia, essential hypertension presenting with right-sided weakness. Pt's daughter states that they noted she is more lethargic and confused with associated right-sided weakness manifesting as difficulty walking which occurred approximately 4 days ago given persistence of symptoms decided to present to the Hospital further workup and evaluation. Emergency Department course found to have a stroke  Clinical Impression  Upon evaluation; pt was A&O x 1 (person only), pt knew that she was at a hospital but was unable to name which one, and was also unable to name date/day of the week. Pt has a history of cognitive impairments at baseline but pt's daughter that was present states that she has been more confused recently due to a UTI which she has been treated for and now this newly dx stroke. Pt was able to demonstrate nearly symmetrical gross UE/LE AROM and strength. Pt was able to transfer from supine to sitting on EOB w/ modified independence; requiring more time and the bed rails for assistance. Pt was able to transfer sit<>stand with RW and min assist +1. Pt ambulated ~15 ft in her room; displaying a shuffled, step-to, gait pattern with a shorter step being taken on the R due to inadequate weight shifting toward L during R swing through phase. PT provided tactile cue to pt's R hip during L stance (R swing through) which enable pt to take a longer step with her R leg. Pt will continue to benefit from skilled acute PT services in order to increase her strength and functional mobility prior to d/c. Currently recommending SNF upon d/c in order for pt to receive further skilled PT services and assistance with ADLs.     Follow Up Recommendations SNF    Equipment Recommendations        Recommendations for Other Services       Precautions / Restrictions Precautions Precautions: Fall Restrictions Weight Bearing Restrictions: No      Mobility  Bed Mobility Overal bed mobility: Modified Independent;+2 for physical assistance             General bed mobility comments: uses bed rail for transfer from supine to sitting on EOB and takes longer for this transfer; required +2 physical assistance for scooting up in bed at end of evaluation (possibly due to fatigue)   Transfers Overall transfer level: Needs assistance Equipment used: Rolling walker (2 wheeled) Transfers: Sit to/from Stand Sit to Stand: Min assist         General transfer comment: Pt will benefit from vc in future sessions for pushing up from bed and creating more hip flexion/trunk extension in order to ease sit<>stand transfer.   Ambulation/Gait Ambulation/Gait assistance: Min assist Ambulation Distance (Feet): 15 Feet Assistive device: Rolling walker (2 wheeled) Gait Pattern/deviations: Shuffle;Decreased weight shift to left;Decreased stance time - right;Decreased stride length;Step-to pattern   Gait velocity interpretation: <1.8 ft/sec, indicative of risk for recurrent falls General Gait Details: Pt displayed a step-to gait pattern with shuffled steps. Pt needed intermittent guidance from PT for advancement of walker forward. Pt demonstrated decreased weight shift toward her L which caused her to take a shorter step with her R leg. Tactile cues provided at her R hip for increased weight shift to the L side; pt was able to demonstrate increased step length on R with this technique.  Stairs            Wheelchair Mobility    Modified Rankin (Stroke Patients Only)       Balance Overall balance assessment: Needs assistance Sitting-balance support: No upper extremity supported;Feet supported Sitting balance-Leahy Scale: Good       Standing balance-Leahy Scale: Fair Standing balance  comment: pt able to maintain standing balance with BUE support on RW and min assist +1.                              Pertinent Vitals/Pain Pain Assessment:  (pt unable to state if she is experiencing pain)    Home Living Family/patient expects to be discharged to:: Private residence Living Arrangements: Alone Available Help at Discharge: Family (Pt was receiving PT for LEs and OT? for UE prior to admission to hospital) Type of Home: House     Entrance Stairs-Number of Steps: 5   Home Equipment: Walker - 2 wheels      Prior Function Level of Independence: Independent with assistive device(s)               Hand Dominance   Dominant Hand: Left    Extremity/Trunk Assessment   Upper Extremity Assessment: Overall WFL for tasks assessed (MMT and AROM reveal BUE strength grossly 3+/5)           Lower Extremity Assessment: Overall WFL for tasks assessed (functional assessment, AROM and MMT reveal BLE strength grossly 4-/5, R side slightly weaker than L)         Communication   Communication: Expressive difficulties  Cognition Arousal/Alertness: Lethargic Behavior During Therapy: WFL for tasks assessed/performed Overall Cognitive Status: Impaired/Different from baseline                      General Comments      Exercises Total Joint Exercises Ankle Circles/Pumps: AROM;Both;5 reps;Supine Long Arc Quad: 5 reps;Both;AROM General Exercises - Upper Extremity Shoulder Flexion: AROM;5 reps;Both      Assessment/Plan    PT Assessment Patient needs continued PT services  PT Diagnosis Difficulty walking;Generalized weakness;Hemiplegia non-dominant side;Altered mental status;Abnormality of gait   PT Problem List Decreased strength;Decreased activity tolerance;Decreased mobility;Decreased cognition  PT Treatment Interventions DME instruction;Gait training;Functional mobility training;Therapeutic activities;Therapeutic exercise;Balance  training;Patient/family education   PT Goals (Current goals can be found in the Care Plan section) Acute Rehab PT Goals Patient Stated Goal: pt did not state goal    Frequency 7X/week   Barriers to discharge        Co-evaluation               End of Session Equipment Utilized During Treatment: Gait belt Activity Tolerance: Patient tolerated treatment well Patient left: in bed;with bed alarm set;with family/visitor present Nurse Communication: Mobility status         Time: 5427-0623 PT Time Calculation (min) (ACUTE ONLY): 23 min   Charges:         PT G Codes:        Daniel Stirparo,SPT 12/17/2014, 2:30 PM

## 2014-12-18 ENCOUNTER — Inpatient Hospital Stay: Payer: PPO

## 2014-12-18 LAB — URINE CULTURE: Culture: NO GROWTH

## 2014-12-18 MED ORDER — QUETIAPINE FUMARATE 25 MG PO TABS
25.0000 mg | ORAL_TABLET | Freq: Every day | ORAL | Status: DC
  Administered 2014-12-18: 25 mg via ORAL
  Filled 2014-12-18: qty 1

## 2014-12-18 NOTE — Plan of Care (Signed)
Problem: Discharge/Transitional Outcomes Goal: Other Discharge Outcomes/Goals Pt for MRA today per orders, neuro checks without change, NIH 4, pt with confusion at baseline and trying to get oob at intervals, alarm on for safety, family into visit this am.

## 2014-12-18 NOTE — Progress Notes (Signed)
NEUROLOGY NOTE  S: No events overnight, some confusion but no big problems noted  ROS unobtainable  O: 98.7 116/73 70 18  NAD, nl weight Normocephalic, oropharynx clear Supple, no JVD CTA B, no wheezing RRR, no murmur No C/C/E   A+O to person only, follows simple commands, have some problems with naming today PERRLA, EOMI, face symmetric 5/5 B, nl tone  A/P: 1.  L BG infarct-  Slight worsening today of naming and more confusion 2.  Encephalopathy-  Likely due to low Na and infection 3.  Dementia-  Baseline appears better than now -  MRA pending -  Continue ASA, plavix and statin -  Treat UTI -  Would start Seroquel 25mg  qHS -  Will follow

## 2014-12-18 NOTE — Plan of Care (Signed)
Problem: Discharge/Transitional Outcomes Goal: Other Discharge Outcomes/Goals Outcome: Progressing Plan of care progress to goal: Pt follows simple commands, but can be impulsive with bathroom needs.  Frequent toileting needs to be addressed.  Pt up to [facility] with one assist. VSS Cardiac diet tolerated

## 2014-12-18 NOTE — Clinical Social Work Placement (Signed)
   CLINICAL SOCIAL WORK PLACEMENT  NOTE  Date:  12/18/2014  Patient Details  Name: Amanda Hogan MRN: [id number] Date of Birth: 06-Nov-1928  Clinical Social Work is seeking post-discharge placement for this patient at the [place] level of care (*CSW will initial, date and re-position this form in  chart as items are completed):  Yes   Patient/family provided with [place] Work Department's list of facilities offering this level of care within the geographic area requested by the patient (or if unable, by the patient's family).  Yes   Patient/family informed of their freedom to choose among providers that offer the needed level of care, that participate in Medicare, Medicaid or managed care program needed by the patient, have an available bed and are willing to accept the patient.  Yes   Patient/family informed of [facility]'s ownership interest in [facility] and [facility], as well as of the fact that they are under no obligation to receive care at these facilities.  PASRR submitted to EDS on 12/17/14     PASRR number received on 12/17/14     Existing PASRR number confirmed on       FL2 transmitted to all facilities in geographic area requested by pt/family on 12/18/14     FL2 transmitted to all facilities within larger geographic area on       Patient informed that his/her managed care company has contracts with or will negotiate with certain facilities, including the following:            Patient/family informed of bed offers received.  Patient chooses bed at       Physician recommends and patient chooses bed at      Patient to be transferred to   on  .  Patient to be transferred to facility by       Patient family notified on   of transfer.  Name of family member notified:        PHYSICIAN Please sign FL2, Please prepare priority discharge summary, including medications, Please prepare prescriptions     Additional  Comment:    _______________________________________________ Ludwig Clarks, LCSW 12/18/2014, 10:09 AM

## 2014-12-18 NOTE — Progress Notes (Signed)
PT Cancellation Note  Patient Details Name: Amanda Hogan MRN: [id number] DOB: [birth year]   Cancelled Treatment:    Reason Eval/Treat Not Completed: Medical issues which prohibited therapy (Pt scheduled for MRA to r/o occlusion. Will hold PT until test results come back and pt cleared for activity. )   Milon Score 12/18/2014, 8:55 AM

## 2014-12-18 NOTE — Clinical Social Work Note (Signed)
Clinical Social Work Assessment  Patient Details  Name: Amanda Hogan MRN: [id number] Date of Birth: 05/29/1928  Date of referral:  12/18/14               Reason for consult:  Discharge Planning                Permission sought to share information with:  Family Supports Permission granted to share information::  No  Name::      (Daughter- hazel [phone])  Agency::   (SNF)  Relationship::     Contact Information:     Housing/Transportation Living arrangements for the past 2 months:  Single Family Home Source of Information:  Adult Children Patient Interpreter Needed:  None Criminal Activity/Legal Involvement Pertinent to Current Situation/Hospitalization:  No - Comment as needed Significant Relationships:  Adult Children, Community Support Lives with:  Adult Children Do you feel safe going back to the place where you live?  No Need for family participation in patient care:  Yes (Comment)  Care giving concerns:  PT recommending SNF   Social Worker assessment / plan:  CSW spoke with daughter who reports her mother was at [facility] in the past and this would be her preference. CSW has communicated with [facility] and they are able to offer her a bed- pending Insurance auth.   Employment status:  Retired Nurse, adult PT Recommendations:  [place] / Referral to community resources:     Patient/Family's Response to care:  Daughter agrees to the plans and treatment being provided.   Patient/Family's Understanding of and Emotional Response to Diagnosis, Current Treatment, and Prognosis:  Daughter agrees to plans for SNF at [facility].   Emotional Assessment Appearance:  Appears older than stated age Attitude/Demeanor/Rapport:  Unable to Assess Affect (typically observed):  Unable to Assess Orientation:  Oriented to Self, Oriented to Place, Oriented to  Time, Oriented to Situation Alcohol / Substance use:   Not Applicable Psych involvement (Current and /or in the community):  No (Comment)  Discharge Needs  Concerns to be addressed:  Discharge Planning Concerns Readmission within the last 30 days:  No Current discharge risk:  Physical Impairment Barriers to Discharge:  No Barriers Identified   Ludwig Clarks, LCSW 12/18/2014, 2:10 PM

## 2014-12-18 NOTE — Care Management Important Message (Signed)
Important Message  Patient Details  Name: Amanda Hogan MRN: [id number] Date of Birth: [birth year]   Medicare Important Message Given:  Yes-second notification given    Shelbie Ammons, RN 12/18/2014, 8:54 AM

## 2014-12-18 NOTE — Progress Notes (Signed)
[place] NAME: Amanda Hogan    MR#:  [id number]  DATE OF BIRTH:  Jun 02, 1928  SUBJECTIVE:  CHIEF COMPLAINT:   Chief Complaint  Patient presents with  . Altered Mental Status   Patient here due to altered mental status and also due to generalized weakness and difficulty walking.  Mental status about the same and no acute events overnight. Awaiting MRA of brain and neck.    REVIEW OF SYSTEMS:    Review of Systems  Unable to perform ROS  due to advanced dementia  Nutrition: Heart healthy Tolerating Diet: Yes Tolerating PT: Seen by PT and they recommend short-term rehabilitation    DRUG ALLERGIES:   Allergies  Allergen Reactions  . Iohexol Shortness Of Breath and Itching  . Contrast Media [Iodinated Diagnostic Agents] Other (See Comments)    Reaction:  Unknown     VITALS:  Blood pressure 139/59, pulse 64, temperature 97.5 F (36.4 C), temperature source Oral, resp. rate 18, height 5\' 5"  (1.651 m), weight 86.41 kg (190 lb 8 oz), SpO2 94 %.  PHYSICAL EXAMINATION:   Physical Exam  GENERAL:  81 y.o.-year-old patient lying in the bed with no acute distress.  EYES: Pupils equal, round, reactive to light and accommodation. No scleral icterus. Extraocular muscles intact.  HEENT: Head atraumatic, normocephalic. Oropharynx and nasopharynx clear.  NECK:  Supple, no jugular venous distention. No thyroid enlargement, no tenderness.  LUNGS: Normal breath sounds bilaterally, no wheezing, rales, rhonchi. No use of accessory muscles of respiration.  CARDIOVASCULAR: S1, S2 normal. 2/6 diastolic murmur at the base, No rubs, or gallops.  ABDOMEN: Soft, nontender, nondistended. Bowel sounds present. No organomegaly or mass.  EXTREMITIES: No cyanosis, clubbing or edema b/l.    NEUROLOGIC: Cranial nerves II through XII are intact. No focal Motor or sensory deficits b/l. Globally weak  PSYCHIATRIC: The patient is alert and  oriented x 1.  SKIN: No obvious rash, lesion, or ulcer.    LABORATORY PANEL:   CBC  Recent Labs Lab 12/17/14 0443  WBC 6.9  HGB 10.4*  HCT 30.0*  PLT 254   ------------------------------------------------------------------------------------------------------------------  Chemistries   Recent Labs Lab 12/17/14 0443  NA 135  K 4.5  CL 103  CO2 23  GLUCOSE 90  BUN 32*  CREATININE 1.69*  CALCIUM 8.7*   ------------------------------------------------------------------------------------------------------------------  Cardiac Enzymes No results for input(s): TROPONINI in the last 168 hours. ------------------------------------------------------------------------------------------------------------------  RADIOLOGY:  Ct Head Wo Contrast  12/16/2014   CLINICAL DATA:  Right lower extremity weakness for 4 days. Confusion. Fatigue.  EXAM: CT HEAD WITHOUT CONTRAST  TECHNIQUE: Contiguous axial images were obtained from the base of the skull through the vertex without intravenous contrast.  COMPARISON:  Brain MRI 02/23/2012  FINDINGS: Extensive chronic small vessel ischemic disease is again seen throughout the cerebral white matter bilaterally. No definite acute large territory infarct is identified. There is an approximately 2 cm focus of low density in the left basal ganglia region involving the caudate and lentiform nuclei is as well as internal capsule which is new and consistent with a chronic or possibly subacute infarct.  Chronic right MCA territory infarcts are again seen involving the frontal and temporal lobes. There is no evidence of acute intracranial hemorrhage, mass, midline shift, or extra-axial fluid collection. There is mild global cerebral atrophy. There is no hydrocephalus.  Visualized orbits are unremarkable. Visualized paranasal sinuses and mastoid air cells are clear. Moderate calcified atherosclerosis is noted at the  skullbase. No skull fracture is seen.  IMPRESSION:  1. No acute intracranial hemorrhage or evidence of acute large territory infarct. 2. New left basal ganglia infarct which appears chronic or possibly subacute. 3. Extensive chronic small vessel ischemic disease and chronic infarcts.   Electronically Signed   By: Logan Bores M.D.   On: 12/16/2014 16:12   Mr Brain Wo Contrast  12/17/2014   CLINICAL DATA:  81 year old female with right side weakness and difficulty walking for 4 days. Initial encounter.  EXAM: MRI HEAD WITHOUT CONTRAST  TECHNIQUE: Multiplanar, multiecho pulse sequences of the brain and surrounding structures were obtained without intravenous contrast.  COMPARISON:  Head CT without contrast 12/16/2014. [facility] Brain MRI 02/23/2012.  FINDINGS: 2 cm area of restricted diffusion in the left basal ganglia which corresponds to the CT finding yesterday. Associated T2 and FLAIR hyperintensity. No definite petechial hemorrhage. No significant mass effect. No other restricted diffusion. Major intracranial vascular flow voids are preserved.  Underlying chronic right MCA territory infarcts with encephalomalacia in the right temporal lobe and operculum. Superimposed Patchy and confluent cerebral white matter T2 and FLAIR hyperintensity. Comparatively mild T2 heterogeneity elsewhere in the deep gray matter nuclei. Small chronic lacunar infarcts in both cerebellar hemispheres.  No midline shift, mass effect, evidence of mass lesion, ventriculomegaly, extra-axial collection or acute intracranial hemorrhage. Cervicomedullary junction and pituitary are within normal limits. Degenerative disc and endplate changes at Q9-V6 with stable appearing mild spinal stenosis.  Minimal paranasal sinus mucosal thickening. Mastoids are clear. Negative orbit and scalp soft tissues.  IMPRESSION: 1. Acute to subacute left basal ganglia infarct with no hemorrhage or mass effect. 2. Chronic ischemic disease otherwise stable since 2013.   Electronically Signed   By: Genevie Ann  M.D.   On: 12/17/2014 09:17     ASSESSMENT AND PLAN:   81 year old female with past medical history of dementia, hypertension, osteoarthritis, hyperlipidemia, history of previous TIA, restless leg syndrome, fibromyalgia, who presented to the hospital due to altered mental status and noted to have a CT scan findings suggestive of a basal ganglia stroke.  #1 acute/subacute CVA-this was confirmed on CT scan also MRI of the brain. Patient although does not have any focal symptoms other than altered mental status and generalized weakness. -Continue aspirin, Plavix, statin. -Appreciate neurology input and has per them this is not small vessel disease. Suspect silent afibrillation with multi-infarct.  Patient will need a 90 day loop recorder upon discharge. -Await MRA of the neck and brain to rule out intracranial stenosis. -Seen by psychotherapy and continue recommended short-term rehabilitation and case management made aware.  #2 altered mental status-etiology unclear. Multifactorial likely. -Likely related to underlying CVA, dementia. -Continue to monitor and not much improved since yesterday.  #3 dementia-continue Namenda. -Started on Seroquel at bedtime by neurology.  #4 hypertension-hemodynamically stable. -Continue metoprolol  #5 history of depression-continue Celexa.  #6 anxiety-continue Xanax  #7 restless leg syndrome-continue Requip.   All the records are reviewed and case discussed with Care Management/Social Workerr. Management plans discussed with the patient, family and they are in agreement.  CODE STATUS: Full  DVT Prophylaxis: Teds and SCDs  TOTAL TIME TAKING CARE OF THIS PATIENT: 25 minutes.   POSSIBLE D/C IN 1-2 DAYS, DEPENDING ON CLINICAL CONDITION.   Henreitta Leber M.D on 12/18/2014 at 3:06 PM  Between 7am to 6pm - Pager - [phone]  After 6pm go to [email or web address] - password [email or web address] [facility]  [facility]  Office  [phone]  CC: Primary  care physician; Delia Chimes, NP

## 2014-12-19 MED ORDER — ASPIRIN 81 MG PO TBEC: 81.0000 mg | DELAYED_RELEASE_TABLET | Freq: Every day | ORAL | Status: DC

## 2014-12-19 MED ORDER — ALPRAZOLAM 0.25 MG PO TABS: 0.2500 mg | ORAL_TABLET | Freq: Every day | ORAL | Status: DC | PRN

## 2014-12-19 MED ORDER — QUETIAPINE FUMARATE 25 MG PO TABS
25.0000 mg | ORAL_TABLET | Freq: Every day | ORAL | Status: DC
Start: 2014-12-19 — End: 2017-12-05

## 2014-12-19 NOTE — Progress Notes (Signed)
Report given to [place] at [facility] EMS to transport.

## 2014-12-19 NOTE — Clinical Social Work Note (Signed)
Auth has been received from Amy at [facility] and patient is discharged to go to [place]. Discharge summary sent and nurse to call report. CSW has contacted patient's daughter, Onalee Hua to inform of discharge. Hazel requested CSW also contact her brother, Dominica Severin [phone]), to notify him as well. CSW has done this and both are in agreement with discharge and transport via EMS.  Shela Leff MSW,LCSW [phone]

## 2014-12-19 NOTE — Progress Notes (Signed)
NEUROLOGY NOTE  S: No events overnight, states that she overall feels better today and wants to go  ROS unobtainable  O: 97.9 123/50 65 18  NAD, nl weight Normocephalic, oropharynx clear Supple, no JVD CTA B, no wheezing RRR, no murmur No C/C/E   A+O to person only, follows simple commands PERRLA, EOMI, face symmetric 5/5 B, nl tone  MRA of head and neck personally reviewed by me and is normal  A/P: 1. L BG infarct- Stable exam now, no fluctuations in strength 2. Encephalopathy- Likely due to low Na and infection 3. Dementia- Baseline appears better than now - Continue ASA, plavix and statin - Would start Seroquel 25mg  qHS -  Needs loop recorder for 90 days - Will sign off, please call with questions -  Needs to f/u with [facility] in 3 months

## 2014-12-19 NOTE — Clinical Social Work Placement (Signed)
   CLINICAL SOCIAL WORK PLACEMENT  NOTE  Date:  12/19/2014  Patient Details  Name: Amanda Hogan MRN: [id number] Date of Birth: February 16, 1929  Clinical Social Work is seeking post-discharge placement for this patient at the [place] level of care (*CSW will initial, date and re-position this form in  chart as items are completed):  Yes   Patient/family provided with [facility] Work Department's list of facilities offering this level of care within the geographic area requested by the patient (or if unable, by the patient's family).  Yes   Patient/family informed of their freedom to choose among providers that offer the needed level of care, that participate in Medicare, Medicaid or managed care program needed by the patient, have an available bed and are willing to accept the patient.  Yes   Patient/family informed of [facility]'s ownership interest in [facility] and [facility], as well as of the fact that they are under no obligation to receive care at these facilities.  PASRR submitted to EDS on 12/17/14     PASRR number received on 12/17/14     Existing PASRR number confirmed on       FL2 transmitted to all facilities in geographic area requested by pt/family on 12/18/14     FL2 transmitted to all facilities within larger geographic area on       Patient informed that his/her managed care company has contracts with or will negotiate with certain facilities, including the following:        Yes   Patient/family informed of bed offers received.  Patient chooses bed at  [facility])     Physician recommends and patient chooses bed at  [facility])    Patient to be transferred to   on 12/19/14.  Patient to be transferred to facility by  (EMS)     Patient family notified on 12/19/14 of transfer.  Name of family member notified:   (daughter and son)     PHYSICIAN Please sign FL2, Please prepare priority discharge summary, including  medications, Please prepare prescriptions     Additional Comment:    _______________________________________________ Shela Leff, LCSW 12/19/2014, 2:10 PM

## 2014-12-19 NOTE — Plan of Care (Signed)
Problem: Discharge/Transitional Outcomes Goal: Barriers To Progression Addressed/Resolved Outcome: Progressing Individualization of Care Pt prefers to be called Amanda Hogan Median Hx of HTN, arthritis, HLD, acid reflux, TIA, restless leg syndrome, heart murmur, stroke, fibromyalgia, anxiety, dementia, CAD, kidney stone, angina   Goal: Other Discharge Outcomes/Goals Outcome: Progressing Barriers: Patient with confusion and dementia.  Education: Safety education provided to patient and reinforced.  Hemodynamically: Patient afebrile and VSS this shift. BP 151/56 mmHg  Pulse 68  Temp(Src) 97.5 F (36.4 C) (Oral)  Resp 20  Ht 5\' 5"  (1.651 m)  Wt 190 lb 8 oz (86.41 kg)  BMI 31.70 kg/m2  SpO2 97% Mobility: Patient in bed for majority of shift. Patient continues with substantial weakness, right leg drag, and right side drift. Diet: Patient able to swallow pills whole with water. Pain: Patient without complaints of pain this shift.

## 2014-12-19 NOTE — Discharge Summary (Signed)
[facility] NAME: Amanda Hogan    MR#:  [id number]  DATE OF BIRTH:  1928-07-20  DATE OF ADMISSION:  12/16/2014 ADMITTING PHYSICIAN: Lytle Butte, MD  DATE OF DISCHARGE: 12/19/2014  PRIMARY CARE PHYSICIAN: Delia Chimes, NP    ADMISSION DIAGNOSIS:  CVA (cerebral infarction) [I63.9] Acute CVA (cerebrovascular accident) [I63.9]  DISCHARGE DIAGNOSIS:  Principal Problem:   Acute CVA (cerebrovascular accident)   SECONDARY DIAGNOSIS:   Past Medical History  Diagnosis Date  . Hypertension   . Arthritis   . High cholesterol   . Acid reflux   . TIA (transient ischemic attack)   . Restless leg syndrome   . Complication of anesthesia     "w/my back; gave me too much; thought I'd had stroke but didn't"  . Heart murmur   . Stroke     "said it looked like bullet holes; had a bunch"  . Angina   . Exertional dyspnea   . Sinus malignant neoplasm   . Fibromyalgia   . Memory disorder, possibly not taking home meds at times 07/22/2011  . Anxiety 02/23/2012  . Dementia 02/23/2012  . Kidney stone   . HTN (hypertension)   . Mild carotid artery disease 08/05/11    carotid dopplers, mild disease  . Chest pain     negative lexiscan myoview 53/13, last echo 02/24/12-EF 50-932, mid systolic obliteration of the LV, cavity aortic sclerosis    HOSPITAL COURSE:   81 year old female with past medical history of dementia, hypertension, osteoarthritis, hyperlipidemia, history of previous TIA, restless leg syndrome, fibromyalgia, who presented to the hospital due to altered mental status and noted to have a CT scan findings suggestive of a basal ganglia stroke.  #1 acute/subacute CVA-this was confirmed on CT scan also MRI of the brain.  -Appreciate neurology input and has per them this is not small vessel disease. Suspect silent afibrillation with multi-infarct. We will arrange for outpatient cardiology follow up to set up a 90 day  event/monitor. -Patient's mental status has somewhat improved since admission. She did have an MRA of the neck and the brain which showed no evidence of carotid or intracranial stenosis. -Patient was also seen by physical therapy and they recommended short-term rehabilitation which is where she is being discharged presently. --Continue aspirin, Plavix, statin.  #2 altered mental status-etiology unclear. Multifactorial likely. -Likely related to underlying CVA, dementia.  Slow to improve and will continue to monitor. -No evidence of any acute infectious etiology presently.  #3 dementia-continue Namenda. -Continue Seroquel at bedtime  #4 hypertension-hemodynamically stable. -Patient will Continue metoprolol  #5 history of depression-patient will continue Celexa.  #6 anxiety-patient will continue Xanax  #7 restless leg syndrome-patient will continue Requip.  #8 GERD - cont. Protonix.   DISCHARGE CONDITIONS:   Stable  CONSULTS OBTAINED:  Treatment Team:  Valora Corporal, MD Valora Corporal, MD  DRUG ALLERGIES:   Allergies  Allergen Reactions  . Iohexol Shortness Of Breath and Itching  . Contrast Media [Iodinated Diagnostic Agents] Other (See Comments)    Reaction:  Unknown     DISCHARGE MEDICATIONS:   Current Discharge Medication List    START taking these medications   Details  aspirin EC 81 MG EC tablet Take 1 tablet (81 mg total) by mouth daily. Qty: 30 tablet    QUEtiapine (SEROQUEL) 25 MG tablet Take 1 tablet (25 mg total) by mouth at bedtime.      CONTINUE these medications which have NOT CHANGED  Details  acetaminophen (TYLENOL) 500 MG tablet Take 1,000 mg by mouth every 6 (six) hours as needed for mild pain or headache.     ALPRAZolam (XANAX) 0.25 MG tablet Take 0.25 mg by mouth daily as needed for anxiety.    amLODipine (NORVASC) 10 MG tablet Take 10 mg by mouth daily.    celecoxib (CELEBREX) 200 MG capsule Take 200 mg by mouth daily.    citalopram  (CELEXA) 20 MG tablet Take 20 mg by mouth daily.    clopidogrel (PLAVIX) 75 MG tablet Take 75 mg by mouth daily.    diclofenac (VOLTAREN) 50 MG EC tablet Take 50 mg by mouth 2 (two) times daily.    furosemide (LASIX) 20 MG tablet Take 20 mg by mouth daily.    gabapentin (NEURONTIN) 300 MG capsule Take 300 mg by mouth at bedtime.    memantine (NAMENDA XR) 28 MG CP24 24 hr capsule Take 28 mg by mouth daily.    metoprolol succinate (TOPROL-XL) 25 MG 24 hr tablet Take 25 mg by mouth daily.    nitroGLYCERIN (NITROSTAT) 0.4 MG SL tablet Place 0.4 mg under the tongue every 5 (five) minutes as needed for chest pain.     pantoprazole (PROTONIX) 40 MG tablet Take 40 mg by mouth 2 (two) times daily.     !! rOPINIRole (REQUIP) 0.5 MG tablet Take 0.5 mg by mouth at bedtime.    !! rOPINIRole (REQUIP) 2 MG tablet Take 2 mg by mouth 3 (three) times daily.     rosuvastatin (CRESTOR) 20 MG tablet Take 20 mg by mouth daily.    spironolactone (ALDACTONE) 25 MG tablet Take 25 mg by mouth daily.    tolterodine (DETROL) 2 MG tablet Take 2 mg by mouth 2 (two) times daily.    trimethoprim (TRIMPEX) 100 MG tablet Take 100 mg by mouth at bedtime.     !! - Potential duplicate medications found. Please discuss with provider.       DISCHARGE INSTRUCTIONS:   DIET:  Cardiac diet  DISCHARGE CONDITION:  Stable  ACTIVITY:  Activity as tolerated  OXYGEN:  Home Oxygen: No.   Oxygen Delivery: room air  DISCHARGE LOCATION:  nursing home   If you experience worsening of your admission symptoms, develop shortness of breath, life threatening emergency, suicidal or homicidal thoughts you must seek medical attention immediately by calling 911 or calling your MD immediately  if symptoms less severe.  You Must read complete instructions/literature along with all the possible adverse reactions/side effects for all the Medicines you take and that have been prescribed to you. Take any new Medicines after you  have completely understood and accpet all the possible adverse reactions/side effects.   Please note  You were cared for by a hospitalist during your hospital stay. If you have any questions about your discharge medications or the care you received while you were in the hospital after you are discharged, you can call the unit and asked to speak with the hospitalist on call if the hospitalist that took care of you is not available. Once you are discharged, your primary care physician will handle any further medical issues. Please note that NO REFILLS for any discharge medications will be authorized once you are discharged, as it is imperative that you return to your primary care physician (or establish a relationship with a primary care physician if you do not have one) for your aftercare needs so that they can reassess your need for medications and monitor your lab  values.     Today   No acute events overnight. Mental status about the same. MRA of the neck and brain negative for any acute hemodynamically significant stenosis.  VITAL SIGNS:  Blood pressure 123/50, pulse 65, temperature 97.9 F (36.6 C), temperature source Oral, resp. rate 18, height 5\' 5"  (1.651 m), weight 86.41 kg (190 lb 8 oz), SpO2 96 %.  I/O:   Intake/Output Summary (Last 24 hours) at 12/19/14 1146 Last data filed at 12/19/14 0915  Gross per 24 hour  Intake    800 ml  Output    250 ml  Net    550 ml    PHYSICAL EXAMINATION:  GENERAL:  81 y.o.-year-old patient lying in the bed with no acute distress.  EYES: Pupils equal, round, reactive to light and accommodation. No scleral icterus. Extraocular muscles intact.  HEENT: Head atraumatic, normocephalic. Oropharynx and nasopharynx clear.  NECK:  Supple, no jugular venous distention. No thyroid enlargement, no tenderness.  LUNGS: Normal breath sounds bilaterally, no wheezing, rales,rhonchi. No use of accessory muscles of respiration.  CARDIOVASCULAR: S1, S2 normal. 2/6  diastolic murmur at the base, rubs, or gallops.  ABDOMEN: Soft, non-tender, non-distended. Bowel sounds present. No organomegaly or mass.  EXTREMITIES: No pedal edema, cyanosis, or clubbing.  NEUROLOGIC: Cranial nerves II through XII are intact. No focal motor or sensory defecits b/l. Globally weak PSYCHIATRIC: The patient is alert and oriented x 1.  SKIN: No obvious rash, lesion, or ulcer.   DATA REVIEW:   CBC  Recent Labs Lab 12/17/14 0443  WBC 6.9  HGB 10.4*  HCT 30.0*  PLT 254    Chemistries   Recent Labs Lab 12/17/14 0443  NA 135  K 4.5  CL 103  CO2 23  GLUCOSE 90  BUN 32*  CREATININE 1.69*  CALCIUM 8.7*    Cardiac Enzymes No results for input(s): TROPONINI in the last 168 hours.  Microbiology Results  Results for orders placed or performed during the hospital encounter of 12/16/14  Urine culture     Status: None   Collection Time: 12/16/14  6:31 PM  Result Value Ref Range Status   Specimen Description URINE, CLEAN CATCH  Final   Special Requests NONE  Final   Culture NO GROWTH 1 DAY  Final   Report Status 12/18/2014 FINAL  Final  MRSA PCR Screening     Status: Abnormal   Collection Time: 12/17/14 12:23 AM  Result Value Ref Range Status   MRSA by PCR POSITIVE (A) NEGATIVE Final    Comment: C/ CAROLYN SONBAY @0212  12/17/14.Marland KitchenMarland KitchenAJO        The GeneXpert MRSA Assay (FDA approved for NASAL specimens only), is one component of a comprehensive MRSA colonization surveillance program. It is not intended to diagnose MRSA infection nor to guide or monitor treatment for MRSA infections.     RADIOLOGY:  Mr Virgel Paling Wo Contrast  12/18/2014   CLINICAL DATA:  82 year old female with right side weakness, difficulty walking, left basal ganglia infarct diagnosed yesterday by MRI. Initial encounter.  EXAM: MRA HEAD WITHOUT CONTRAST  MRA NECK WITHOUT CONTRAST  TECHNIQUE: Angiographic images of the Circle of Willis were obtained using MRA technique without intravenous  contrast. Angiographic images of the neck were obtained using MRA technique without intravenous contrast. Carotid stenosis measurements (when applicable) are obtained utilizing NASCET criteria, using the distal internal carotid diameter as the denominator.  COMPARISON:  Brain MRI 12/17/2014. [facility] Intracranial MRA 02/23/2012.  FINDINGS: MRA NECK FINDINGS  Time-of-flight  MRA images of the neck demonstrate antegrade flow in both carotid and vertebral arteries in the neck. The left vertebral artery is dominant. Flow signal is maintained at the bilateral carotid bifurcations without evidence of hemodynamically significant cervical ICA stenosis.  MRA HEAD FINDINGS  Today's MRA is of superior technical quality you when compared to that in 2013.  Antegrade flow in the posterior circulation supplied primarily by the dominant left vertebral artery. The diminutive right vertebral artery terminates in PICA. Left PICA origin appears patent. Basilar artery is patent without hemodynamically significant stenosis. SCA and PCA origins are patent. MOTSA artifact affecting the bilateral P2 segments. On source images no proximal PCA stenosis is suspected, and visualized PCA branches appear stable. Posterior communicating arteries are diminutive or absent.  Antegrade flow in both ICA siphons. No siphon stenosis. Stable ophthalmic artery origins. Patent carotid termini. Normal MCA and ACA origins.  Diminutive or absent anterior communicating artery. Visualized bilateral ACA branches are within normal limits allowing for some motion artifact. Visualized right MCA branches are within normal limits.  Left MCA M1 segment and visualized left MCA branches are within normal limits.  IMPRESSION: Negative for age noncontrast head and neck MRA. No emergent large vessel occlusion or convincing proximal circle of Willis branch stenosis.   Electronically Signed   By: Genevie Ann M.D.   On: 12/18/2014 19:50   Mr Angiogram Neck Wo  Contrast  12/18/2014   CLINICAL DATA:  81 year old female with right side weakness, difficulty walking, left basal ganglia infarct diagnosed yesterday by MRI. Initial encounter.  EXAM: MRA HEAD WITHOUT CONTRAST  MRA NECK WITHOUT CONTRAST  TECHNIQUE: Angiographic images of the Circle of Willis were obtained using MRA technique without intravenous contrast. Angiographic images of the neck were obtained using MRA technique without intravenous contrast. Carotid stenosis measurements (when applicable) are obtained utilizing NASCET criteria, using the distal internal carotid diameter as the denominator.  COMPARISON:  Brain MRI 12/17/2014. [facility] Intracranial MRA 02/23/2012.  FINDINGS: MRA NECK FINDINGS  Time-of-flight MRA images of the neck demonstrate antegrade flow in both carotid and vertebral arteries in the neck. The left vertebral artery is dominant. Flow signal is maintained at the bilateral carotid bifurcations without evidence of hemodynamically significant cervical ICA stenosis.  MRA HEAD FINDINGS  Today's MRA is of superior technical quality you when compared to that in 2013.  Antegrade flow in the posterior circulation supplied primarily by the dominant left vertebral artery. The diminutive right vertebral artery terminates in PICA. Left PICA origin appears patent. Basilar artery is patent without hemodynamically significant stenosis. SCA and PCA origins are patent. MOTSA artifact affecting the bilateral P2 segments. On source images no proximal PCA stenosis is suspected, and visualized PCA branches appear stable. Posterior communicating arteries are diminutive or absent.  Antegrade flow in both ICA siphons. No siphon stenosis. Stable ophthalmic artery origins. Patent carotid termini. Normal MCA and ACA origins.  Diminutive or absent anterior communicating artery. Visualized bilateral ACA branches are within normal limits allowing for some motion artifact. Visualized right MCA branches are within  normal limits.  Left MCA M1 segment and visualized left MCA branches are within normal limits.  IMPRESSION: Negative for age noncontrast head and neck MRA. No emergent large vessel occlusion or convincing proximal circle of Willis branch stenosis.   Electronically Signed   By: Genevie Ann M.D.   On: 12/18/2014 19:50      Management plans discussed with the patient, family and they are in agreement.  CODE STATUS:  Code Status Orders        Start     Ordered   12/16/14 2043  Full code   Continuous     12/16/14 2043      TOTAL TIME TAKING CARE OF THIS PATIENT: 40 minutes.    Henreitta Leber M.D on 12/19/2014 at 11:46 AM  Between 7am to 6pm - Pager - [phone]  After 6pm go to [email or web address] - password [email or web address] [facility]  [facility]  Office  [phone]  CC: Primary care physician; Delia Chimes, NP

## 2014-12-19 NOTE — Clinical Social Work Note (Deleted)
CSW has sent patient's referral to [facility] this morning for reauth. CSW has noted MD might want to discharge patient today to [facility]. CSW has left message for Varney Biles at [facility] regarding needing a determination as soon as possible. Shela Leff MSW,LCSW [phone]

## 2014-12-19 NOTE — Clinical Social Work Note (Signed)
CSW informed by MD that patient is ready to go to [facility] today. CSW contacted Amy at [facility] and she stated that she had not yet received a call for auth for patient.  Amy is going to review patient's chart this morning and contact me with a determination. Shela Leff MSW,LCSW [phone]

## 2014-12-23 ENCOUNTER — Encounter: Payer: Self-pay | Admitting: Internal Medicine

## 2014-12-23 NOTE — Progress Notes (Deleted)
Patient ID: Amanda Hogan, female   DOB: 02/18/1929, 81 y.o.   MRN: [id number]     [facility]  PCP: Delia Chimes, NP  Code Status: Full Code   Allergies  Allergen Reactions  . Iohexol Shortness Of Breath and Itching  . Contrast Media [Iodinated Diagnostic Agents] Other (See Comments)    Reaction:  Unknown     Chief Complaint  Patient presents with  . New Admit To SNF    New Admission      HPI:  81 y.o. patient is here for short term rehabilitation post hospital admission from   Review of Systems:  Constitutional: Negative for fever, chills, malaise/fatigue and diaphoresis.  HENT: Negative for headache, congestion, nasal discharge, hearing loss, earache, sore throat, difficulty swallowing.   Eyes: Negative for eye pain, blurred vision, double vision and discharge.  Respiratory: Negative for cough, shortness of breath and wheezing.   Cardiovascular: Negative for chest pain, palpitations, leg swelling.  Gastrointestinal: Negative for heartburn, nausea, vomiting, abdominal pain, loss of appetite, melena, diarrhea and constipation.  Genitourinary: Negative for dysuria, urgency, frequency, hematuria, incontinence and flank pain.  Musculoskeletal: Negative for back pain, falls, joint pain and myalgias. assistive device used Skin: Negative for itching, sores and rash.  Neurological: Negative for weakness,dizziness, tingling, focal weakness Psychiatric/Behavioral: Negative for depression, anxiety, insomnia and memory loss.    Past Medical History  Diagnosis Date  . Hypertension   . Arthritis   . High cholesterol   . Acid reflux   . TIA (transient ischemic attack)   . Restless leg syndrome   . Complication of anesthesia     "w/my back; gave me too much; thought I'd had stroke but didn't"  . Heart murmur   . Stroke [facility])     "said it looked like bullet holes; had a bunch"  . Angina   . Exertional dyspnea   . Sinus malignant neoplasm ([place])   .  Fibromyalgia   . Memory disorder, possibly not taking home meds at times 07/22/2011  . Anxiety 02/23/2012  . Dementia 02/23/2012  . Kidney stone   . HTN (hypertension)   . Mild carotid artery disease ([place]) 08/05/11    carotid dopplers, mild disease  . Chest pain     negative lexiscan myoview 53/13, last echo 02/24/12-EF 27-035, mid systolic obliteration of the LV, cavity aortic sclerosis   Past Surgical History  Procedure Laterality Date  . Cholecystectomy  2005  . Vaginal hysterectomy    . Breast cyst excision      left  . Hernia repair  2007    "stomach"  . Lung surgery      "made 3 holes and pulled gel out"  . Knee cartilage surgery      right  . Lumbar spine surgery      "put box in my lower back"  . Elbow bursa surgery      left  . Cataract extraction w/ intraocular lens  implant, bilateral    . Eye surgery  1940    fixed my crossed eyes"  . Kidney stone surgery      "right side; cut it out"  . Carpal tunnel release      right   Social History:   reports that she has never smoked. She has never used smokeless tobacco. She reports that she does not drink alcohol or use illicit drugs.  Family History  Problem Relation Age of Onset  . Diabetes Neg Hx  Medications:   Medication List       This list is accurate as of: 12/23/14 10:10 AM.  Always use your most recent med list.               acetaminophen 500 MG tablet  Commonly known as:  TYLENOL  Take 1,000 mg by mouth every 6 (six) hours as needed for mild pain or headache.     ALPRAZolam 0.25 MG tablet  Commonly known as:  XANAX  Take 1 tablet (0.25 mg total) by mouth daily as needed for anxiety.     amLODipine 10 MG tablet  Commonly known as:  NORVASC  Take 10 mg by mouth daily.     aspirin 81 MG EC tablet  Take 1 tablet (81 mg total) by mouth daily.     celecoxib 200 MG capsule  Commonly known as:  CELEBREX  Take 200 mg by mouth daily.     citalopram 20 MG tablet  Commonly known as:  CELEXA    Take 20 mg by mouth daily.     clopidogrel 75 MG tablet  Commonly known as:  PLAVIX  Take 75 mg by mouth daily.     diclofenac 50 MG EC tablet  Commonly known as:  VOLTAREN  Take 50 mg by mouth 2 (two) times daily.     furosemide 20 MG tablet  Commonly known as:  LASIX  Take 20 mg by mouth daily.     gabapentin 300 MG capsule  Commonly known as:  NEURONTIN  Take 300 mg by mouth at bedtime.     metoprolol succinate 25 MG 24 hr tablet  Commonly known as:  TOPROL-XL  Take 25 mg by mouth daily.     NAMENDA XR 28 MG Cp24 24 hr capsule  Generic drug:  memantine  Take 28 mg by mouth daily.     nitroGLYCERIN 0.4 MG SL tablet  Commonly known as:  NITROSTAT  Place 0.4 mg under the tongue every 5 (five) minutes as needed for chest pain.     pantoprazole 40 MG tablet  Commonly known as:  PROTONIX  Take 40 mg by mouth 2 (two) times daily.     QUEtiapine 25 MG tablet  Commonly known as:  SEROQUEL  Take 1 tablet (25 mg total) by mouth at bedtime.     rOPINIRole 2 MG tablet  Commonly known as:  REQUIP  Take 2 mg by mouth 3 (three) times daily.     rOPINIRole 0.5 MG tablet  Commonly known as:  REQUIP  Take 0.5 mg by mouth at bedtime.     rosuvastatin 20 MG tablet  Commonly known as:  CRESTOR  Take 20 mg by mouth daily.     spironolactone 25 MG tablet  Commonly known as:  ALDACTONE  Take 25 mg by mouth daily.     tolterodine 2 MG tablet  Commonly known as:  DETROL  Take 2 mg by mouth 2 (two) times daily.     trimethoprim 100 MG tablet  Commonly known as:  TRIMPEX  Take 100 mg by mouth at bedtime.         Physical Exam: *** Filed Vitals:   12/23/14 1006  BP: 121/70  Pulse: 63  Temp: 97.7 F (36.5 C)  TempSrc: Oral  Resp: 20    General- elderly female, well built, in no acute distress Head- normocephalic, atraumatic Ears- left ear normal tympanic membrane and normal external ear canal , right ear normal tympanic membrane and  normal external ear  canal Nose- normal nasal mucosa, no maxillary or frontal sinus tenderness, no nasal discharge Throat- moist mucus membrane, normal oropharynx, dentition is  Eyes- PERRLA, EOMI, no pallor, no icterus, no discharge, normal conjunctiva, normal sclera Neck- no cervical lymphadenopathy, no supraclavicular lymphadenopathy, no thyromegaly, no jugular vein distension, no carotid bruit Chest- no chest wall deformities, no chest wall tenderness Breast- normal appearance, no masses or lumps on palpation, normal nipple and areola exam, no axillary lymphadenopathy Cardiovascular- normal s1,s2, no murmurs/ rubs/ gallops, dorsalis pedis and radial pulses, leg edema Respiratory- bilateral clear to auscultation, no wheeze, no rhonchi, no crackles, no use of accessory muscles Abdomen- bowel sounds present, soft, non tender, no organomegaly, no abdominal bruits, no guarding or rigidity, no CVA tenderness Pelvic exam- normal pelvic exam, no adenexal or cervical motion tenderness Musculoskeletal- able to move all 4 extremities, no spinal and paraspinal tenderness, normal back curvature, steady gait, no use of assistive device, normal range of motion Neurological- no focal deficit, alert and oriented to person, place and time, normal reflexes, normal muscle strength, normal sensation to fine touch and vibration Skin- warm and dry Nails- hypertrophy, ingrown Psychiatry- normal mood and affect    Labs reviewed: Basic Metabolic Panel:  Recent Labs  10/28/14 1151  11/20/14 1003 12/16/14 1541 12/17/14 0443  NA  --   < > 134* 130* 135  K  --   < > 5.3* 4.8 4.5  CL  --   < > 102 99* 103  CO2  --   < > 24 23 23   GLUCOSE  --   < > 99 125* 90  BUN  --   < > 29* 31* 32*  CREATININE  --   < > 1.41* 1.71* 1.69*  CALCIUM  --   < > 9.3 9.3 8.7*  MG 1.8  --   --   --   --   < > = values in this interval not displayed. Liver Function Tests:  Recent Labs  10/27/14 1223 11/20/14 1003  AST 20 29  ALT 22 13*   ALKPHOS 92 66  BILITOT 1.4* 1.1  PROT 7.9 8.4*  ALBUMIN 3.5 4.3   No results for input(s): LIPASE, AMYLASE in the last 8760 hours. No results for input(s): AMMONIA in the last 8760 hours. CBC:  Recent Labs  10/27/14 1221  11/20/14 1003 12/16/14 1541 12/17/14 0443  WBC 11.0*  < > 8.2 7.4 6.9  NEUTROABS 8.7*  --   --  5.1  --   HGB 9.8*  < > 11.9* 11.8* 10.4*  HCT 30.4*  < > 35.7 35.4 30.0*  MCV 94.1  < > 90.5 91.1 91.6  PLT 306  < > 275 323 254  < > = values in this interval not displayed. Cardiac Enzymes:  Recent Labs  10/28/14 0756 10/28/14 1410 11/20/14 1003  TROPONINI 0.25* 0.18* <0.03   BNP: Invalid input(s): POCBNP CBG: No results for input(s): GLUCAP in the last 8760 hours.  Radiological Exams: Ct Head Wo Contrast  12/16/2014   CLINICAL DATA:  Right lower extremity weakness for 4 days. Confusion. Fatigue.  EXAM: CT HEAD WITHOUT CONTRAST  TECHNIQUE: Contiguous axial images were obtained from the base of the skull through the vertex without intravenous contrast.  COMPARISON:  Brain MRI 02/23/2012  FINDINGS: Extensive chronic small vessel ischemic disease is again seen throughout the cerebral white matter bilaterally. No definite acute large territory infarct is identified. There is an approximately 2 cm focus of  low density in the left basal ganglia region involving the caudate and lentiform nuclei is as well as internal capsule which is new and consistent with a chronic or possibly subacute infarct.  Chronic right MCA territory infarcts are again seen involving the frontal and temporal lobes. There is no evidence of acute intracranial hemorrhage, mass, midline shift, or extra-axial fluid collection. There is mild global cerebral atrophy. There is no hydrocephalus.  Visualized orbits are unremarkable. Visualized paranasal sinuses and mastoid air cells are clear. Moderate calcified atherosclerosis is noted at the skullbase. No skull fracture is seen.  IMPRESSION: 1. No  acute intracranial hemorrhage or evidence of acute large territory infarct. 2. New left basal ganglia infarct which appears chronic or possibly subacute. 3. Extensive chronic small vessel ischemic disease and chronic infarcts.   Electronically Signed   By: Logan Bores M.D.   On: 12/16/2014 16:12   Mr Brain Wo Contrast  12/17/2014   CLINICAL DATA:  81 year old female with right side weakness and difficulty walking for 4 days. Initial encounter.  EXAM: MRI HEAD WITHOUT CONTRAST  TECHNIQUE: Multiplanar, multiecho pulse sequences of the brain and surrounding structures were obtained without intravenous contrast.  COMPARISON:  Head CT without contrast 12/16/2014. [facility] Brain MRI 02/23/2012.  FINDINGS: 2 cm area of restricted diffusion in the left basal ganglia which corresponds to the CT finding yesterday. Associated T2 and FLAIR hyperintensity. No definite petechial hemorrhage. No significant mass effect. No other restricted diffusion. Major intracranial vascular flow voids are preserved.  Underlying chronic right MCA territory infarcts with encephalomalacia in the right temporal lobe and operculum. Superimposed Patchy and confluent cerebral white matter T2 and FLAIR hyperintensity. Comparatively mild T2 heterogeneity elsewhere in the deep gray matter nuclei. Small chronic lacunar infarcts in both cerebellar hemispheres.  No midline shift, mass effect, evidence of mass lesion, ventriculomegaly, extra-axial collection or acute intracranial hemorrhage. Cervicomedullary junction and pituitary are within normal limits. Degenerative disc and endplate changes at M6-Y0 with stable appearing mild spinal stenosis.  Minimal paranasal sinus mucosal thickening. Mastoids are clear. Negative orbit and scalp soft tissues.  IMPRESSION: 1. Acute to subacute left basal ganglia infarct with no hemorrhage or mass effect. 2. Chronic ischemic disease otherwise stable since 2013.   Electronically Signed   By: Genevie Ann M.D.    On: 12/17/2014 09:17    EKG: Independently reviewed. ***  Assessment/Plan No problem-specific assessment & plan notes found for this encounter.      Goals of care: short term rehabilitation   Labs/tests ordered:  Family/ staff Communication: reviewed care plan with patient and nursing supervisor    Blanchie Serve, MD  [facility] [phone] (Monday-Friday 8 am - 5 pm) [phone] (afterhours)

## 2014-12-24 ENCOUNTER — Non-Acute Institutional Stay (SKILLED_NURSING_FACILITY): Payer: PPO | Admitting: Internal Medicine

## 2014-12-24 DIAGNOSIS — G2581 Restless legs syndrome: Secondary | ICD-10-CM | POA: Diagnosis not present

## 2014-12-24 DIAGNOSIS — I10 Essential (primary) hypertension: Secondary | ICD-10-CM

## 2014-12-24 DIAGNOSIS — G3183 Dementia with Lewy bodies: Secondary | ICD-10-CM | POA: Diagnosis not present

## 2014-12-24 DIAGNOSIS — D638 Anemia in other chronic diseases classified elsewhere: Secondary | ICD-10-CM | POA: Diagnosis not present

## 2014-12-24 DIAGNOSIS — I4891 Unspecified atrial fibrillation: Secondary | ICD-10-CM

## 2014-12-24 DIAGNOSIS — N183 Chronic kidney disease, stage 3 unspecified: Secondary | ICD-10-CM

## 2014-12-24 DIAGNOSIS — I639 Cerebral infarction, unspecified: Secondary | ICD-10-CM

## 2014-12-24 DIAGNOSIS — F329 Major depressive disorder, single episode, unspecified: Secondary | ICD-10-CM

## 2014-12-24 DIAGNOSIS — F0280 Dementia in other diseases classified elsewhere without behavioral disturbance: Secondary | ICD-10-CM

## 2014-12-24 DIAGNOSIS — R531 Weakness: Secondary | ICD-10-CM | POA: Diagnosis not present

## 2014-12-24 DIAGNOSIS — I5032 Chronic diastolic (congestive) heart failure: Secondary | ICD-10-CM

## 2014-12-24 NOTE — Progress Notes (Signed)
This encounter was created in error - please disregard.

## 2014-12-24 NOTE — Progress Notes (Addendum)
Patient ID: Amanda Hogan, female   DOB: [birth year], 81 y.o.   MRN: [id number]       [facility]   PCP: Delia Chimes, NP  Code Status: Full  Allergies  Allergen Reactions  . Iohexol Shortness Of Breath and Itching  . Contrast Media [Iodinated Diagnostic Agents] Other (See Comments)    Reaction:  Unknown     Chief Complaint  Patient presents with  . New Admit To SNF    New Admit     HPI:  81 y.o. patient is here for short term rehabilitation post hospital admission from 12/16/14-12/19/14 with acute CVA from basal ganglia stroke. She was seen by neurology and is now on aspirin, plavix, statin and is pending cardiology appointment with 90 day event monitor to be placed. Her MRA neck showed no carotid stenosis. She is seen in her room today. She complaints of generalized weakness and mentions improving with therapy sessions. Denies any complaints this visit. No new concerns from staff.  Review of Systems:  Constitutional: Negative for fever, chills, diaphoresis.  HENT: Negative for headache, congestion, nasal discharge, difficulty swallowing.   Eyes: Negative for eye pain, blurred vision, double vision and discharge.  Respiratory: Negative for cough, shortness of breath and wheezing.   Cardiovascular: Negative for chest pain, palpitations, leg swelling.  Gastrointestinal: Negative for heartburn, nausea, vomiting, abdominal pain Genitourinary: Negative for dysuria, flank pain.  Musculoskeletal: Negative for back pain, falls in facility Skin: Negative for itching, rash.  Neurological: Negative for dizziness, tingling, focal weakness Psychiatric/Behavioral: Negative for depression. Has memory loss   Past Medical History  Diagnosis Date  . Hypertension   . Arthritis   . High cholesterol   . Acid reflux   . TIA (transient ischemic attack)   . Restless leg syndrome   . Complication of anesthesia     "w/my back; gave me too much; thought I'd had stroke but didn't"    . Heart murmur   . Stroke [facility])     "said it looked like bullet holes; had a bunch"  . Angina   . Exertional dyspnea   . Sinus malignant neoplasm ([place])   . Fibromyalgia   . Memory disorder, possibly not taking home meds at times 07/22/2011  . Anxiety 02/23/2012  . Dementia 02/23/2012  . Kidney stone   . HTN (hypertension)   . Mild carotid artery disease ([place]) 08/05/11    carotid dopplers, mild disease  . Chest pain     negative lexiscan myoview 53/13, last echo 02/24/12-EF 76-734, mid systolic obliteration of the LV, cavity aortic sclerosis   Past Surgical History  Procedure Laterality Date  . Cholecystectomy  2005  . Vaginal hysterectomy    . Breast cyst excision      left  . Hernia repair  2007    "stomach"  . Lung surgery      "made 3 holes and pulled gel out"  . Knee cartilage surgery      right  . Lumbar spine surgery      "put box in my lower back"  . Elbow bursa surgery      left  . Cataract extraction w/ intraocular lens  implant, bilateral    . Eye surgery  1940    fixed my crossed eyes"  . Kidney stone surgery      "right side; cut it out"  . Carpal tunnel release      right   Social History:   reports that she has  never smoked. She has never used smokeless tobacco. She reports that she does not drink alcohol or use illicit drugs.  Family History  Problem Relation Age of Onset  . Diabetes Neg Hx     Medications:   Medication List       This list is accurate as of: 12/24/14 11:29 AM.  Always use your most recent med list.               acetaminophen 500 MG tablet  Commonly known as:  TYLENOL  Take 1,000 mg by mouth every 6 (six) hours as needed for mild pain or headache.     ALPRAZolam 0.25 MG tablet  Commonly known as:  XANAX  Take 1 tablet (0.25 mg total) by mouth daily as needed for anxiety.     amLODipine 10 MG tablet  Commonly known as:  NORVASC  Take 10 mg by mouth daily.     aspirin 81 MG EC tablet  Take 1 tablet (81 mg total) by  mouth daily.     atorvastatin 80 MG tablet  Commonly known as:  LIPITOR  Take one tablet by mouth once daily for cholesterol     celecoxib 200 MG capsule  Commonly known as:  CELEBREX  Take 200 mg by mouth daily.     citalopram 20 MG tablet  Commonly known as:  CELEXA  Take 20 mg by mouth daily.     clopidogrel 75 MG tablet  Commonly known as:  PLAVIX  Take 75 mg by mouth daily.     diclofenac 50 MG EC tablet  Commonly known as:  VOLTAREN  Take 50 mg by mouth 2 (two) times daily.     furosemide 20 MG tablet  Commonly known as:  LASIX  Take 20 mg by mouth daily.     gabapentin 300 MG capsule  Commonly known as:  NEURONTIN  Take 300 mg by mouth at bedtime.     metoprolol succinate 25 MG 24 hr tablet  Commonly known as:  TOPROL-XL  Take 25 mg by mouth daily.     NAMENDA XR 28 MG Cp24 24 hr capsule  Generic drug:  memantine  Take 28 mg by mouth daily.     nitroGLYCERIN 0.4 MG SL tablet  Commonly known as:  NITROSTAT  Place 0.4 mg under the tongue every 5 (five) minutes as needed for chest pain.     pantoprazole 40 MG tablet  Commonly known as:  PROTONIX  Take 40 mg by mouth 2 (two) times daily.     QUEtiapine 25 MG tablet  Commonly known as:  SEROQUEL  Take 1 tablet (25 mg total) by mouth at bedtime.     rOPINIRole 2 MG tablet  Commonly known as:  REQUIP  Take 2 mg by mouth 3 (three) times daily.     rOPINIRole 0.5 MG tablet  Commonly known as:  REQUIP  Take 0.5 mg by mouth at bedtime.     spironolactone 25 MG tablet  Commonly known as:  ALDACTONE  Take 25 mg by mouth daily.     tolterodine 2 MG tablet  Commonly known as:  DETROL  Take 2 mg by mouth 2 (two) times daily.     trimethoprim 100 MG tablet  Commonly known as:  TRIMPEX  Take 100 mg by mouth at bedtime.         Physical Exam: Filed Vitals:   12/24/14 1116  BP: 121/70  Pulse: 63  Temp: 97.7 F (36.5 C)  TempSrc: Oral  Resp: 20  Height: 5\' 5"  (1.651 m)  Weight: 190 lb (86.183 kg)     General- elderly female, in no acute distress Head- normocephalic, atraumatic Throat- moist mucus membrane  Eyes- PERRLA, EOMI, no pallor, no icterus, no discharge, normal conjunctiva, normal sclera Neck- no cervical lymphadenopathy, no jugular vein distension Cardiovascular- normal s1,s2, no murmurs, trace leg edema Respiratory- bilateral clear to auscultation, no wheeze, no rhonchi, no crackles, no use of accessory muscles, on o2 Abdomen- bowel sounds present, soft, non tender Musculoskeletal- able to move all 4 extremities, generalized weakness, on wheelchair  Neurological- no focal deficit, alert and oriented to person Skin- warm and dry Psychiatry- normal mood and affect   Labs reviewed: Basic Metabolic Panel:  Recent Labs  10/28/14 1151  11/20/14 1003 12/16/14 1541 12/17/14 0443  NA  --   < > 134* 130* 135  K  --   < > 5.3* 4.8 4.5  CL  --   < > 102 99* 103  CO2  --   < > 24 23 23   GLUCOSE  --   < > 99 125* 90  BUN  --   < > 29* 31* 32*  CREATININE  --   < > 1.41* 1.71* 1.69*  CALCIUM  --   < > 9.3 9.3 8.7*  MG 1.8  --   --   --   --   < > = values in this interval not displayed. Liver Function Tests:  Recent Labs  10/27/14 1223 11/20/14 1003  AST 20 29  ALT 22 13*  ALKPHOS 92 66  BILITOT 1.4* 1.1  PROT 7.9 8.4*  ALBUMIN 3.5 4.3   No results for input(s): LIPASE, AMYLASE in the last 8760 hours. No results for input(s): AMMONIA in the last 8760 hours. CBC:  Recent Labs  10/27/14 1221  11/20/14 1003 12/16/14 1541 12/17/14 0443  WBC 11.0*  < > 8.2 7.4 6.9  NEUTROABS 8.7*  --   --  5.1  --   HGB 9.8*  < > 11.9* 11.8* 10.4*  HCT 30.4*  < > 35.7 35.4 30.0*  MCV 94.1  < > 90.5 91.1 91.6  PLT 306  < > 275 323 254  < > = values in this interval not displayed. Cardiac Enzymes:  Recent Labs  10/28/14 0756 10/28/14 1410 11/20/14 1003  TROPONINI 0.25* 0.18* <0.03   BNP: Invalid input(s): POCBNP CBG: No results for input(s): GLUCAP in the last  8760 hours.  Radiological Exams: Ct Head Wo Contrast  12/16/2014   CLINICAL DATA:  Right lower extremity weakness for 4 days. Confusion. Fatigue.  EXAM: CT HEAD WITHOUT CONTRAST  TECHNIQUE: Contiguous axial images were obtained from the base of the skull through the vertex without intravenous contrast.  COMPARISON:  Brain MRI 02/23/2012  FINDINGS: Extensive chronic small vessel ischemic disease is again seen throughout the cerebral white matter bilaterally. No definite acute large territory infarct is identified. There is an approximately 2 cm focus of low density in the left basal ganglia region involving the caudate and lentiform nuclei is as well as internal capsule which is new and consistent with a chronic or possibly subacute infarct.  Chronic right MCA territory infarcts are again seen involving the frontal and temporal lobes. There is no evidence of acute intracranial hemorrhage, mass, midline shift, or extra-axial fluid collection. There is mild global cerebral atrophy. There is no hydrocephalus.  Visualized orbits are unremarkable. Visualized paranasal sinuses and mastoid air cells are  clear. Moderate calcified atherosclerosis is noted at the skullbase. No skull fracture is seen.  IMPRESSION: 1. No acute intracranial hemorrhage or evidence of acute large territory infarct. 2. New left basal ganglia infarct which appears chronic or possibly subacute. 3. Extensive chronic small vessel ischemic disease and chronic infarcts.   Electronically Signed   By: Logan Bores M.D.   On: 12/16/2014 16:12   Mr Brain Wo Contrast  12/17/2014   CLINICAL DATA:  81 year old female with right side weakness and difficulty walking for 4 days. Initial encounter.  EXAM: MRI HEAD WITHOUT CONTRAST  TECHNIQUE: Multiplanar, multiecho pulse sequences of the brain and surrounding structures were obtained without intravenous contrast.  COMPARISON:  Head CT without contrast 12/16/2014. [facility] Brain MRI 02/23/2012.   FINDINGS: 2 cm area of restricted diffusion in the left basal ganglia which corresponds to the CT finding yesterday. Associated T2 and FLAIR hyperintensity. No definite petechial hemorrhage. No significant mass effect. No other restricted diffusion. Major intracranial vascular flow voids are preserved.  Underlying chronic right MCA territory infarcts with encephalomalacia in the right temporal lobe and operculum. Superimposed Patchy and confluent cerebral white matter T2 and FLAIR hyperintensity. Comparatively mild T2 heterogeneity elsewhere in the deep gray matter nuclei. Small chronic lacunar infarcts in both cerebellar hemispheres.  No midline shift, mass effect, evidence of mass lesion, ventriculomegaly, extra-axial collection or acute intracranial hemorrhage. Cervicomedullary junction and pituitary are within normal limits. Degenerative disc and endplate changes at H7-S1 with stable appearing mild spinal stenosis.  Minimal paranasal sinus mucosal thickening. Mastoids are clear. Negative orbit and scalp soft tissues.  IMPRESSION: 1. Acute to subacute left basal ganglia infarct with no hemorrhage or mass effect. 2. Chronic ischemic disease otherwise stable since 2013.   Electronically Signed   By: Genevie Ann M.D.   On: 12/17/2014 09:17    Assessment/Plan  Generalized weakness Will have her work with physical therapy and occupational therapy team to help with gait training and muscle strengthening exercises.fall precautions. Skin care. Encourage to be out of bed.   Acute CVA Basal ganglia stroke. Continue ec aspirin 81 mg daily with plavix 75 mg daily and lipitor 80 mg daily. Pending cardiology appointment for event monitor placement. To work with therapy team for gait training.  Chronic CHF euvolemic on exam. Leg edema present. Currently on lasix 20 mg daily, toprol xl 25 mg daily, aldactone 25 mg daily and o2, wean o2 off as tolerated. To keep legs elevated at rest.  Hypertension Stable bp, continue  norvasc 10 mg daily and toprol xl 25 mg daily, monitor bp  HLD Continue lipitor 80 mg daily  Dementia No behavioral disturbance, continue namenda xr 28 mg and celexa 20 mg daily for depression  Depression Continue celexa 20 mg daily and seroquel 25 mg daily  afib Rate currently controlled, continue toprol xl 25 mg daily  ckd 3 With her on lasix, monitor bmp  RLS Continue requip and gabapentin for now  Urinary incontinence Continue Detrol 2 mg bid and trimethoprim 100 mg daily for uti prophylaxis  Anemia of chronic disease Monitor h&h  Goals of care: short term rehabilitation   Labs/tests ordered: cbc, cmp 12/29/14  Family/ staff Communication: reviewed care plan with patient and nursing supervisor    Blanchie Serve, MD  [facility] [phone] (Monday-Friday 8 am - 5 pm) [phone] (afterhours)

## 2014-12-27 ENCOUNTER — Emergency Department: Payer: PPO

## 2014-12-27 ENCOUNTER — Inpatient Hospital Stay
Admission: EM | Admit: 2014-12-27 | Discharge: 2015-01-01 | DRG: 682 | Disposition: A | Discharge status: Skilled Nursing Facility | Payer: PPO | Attending: Internal Medicine | Admitting: Internal Medicine

## 2014-12-27 ENCOUNTER — Encounter: Payer: Self-pay | Admitting: Emergency Medicine

## 2014-12-27 DIAGNOSIS — N179 Acute kidney failure, unspecified: Secondary | ICD-10-CM | POA: Diagnosis not present

## 2014-12-27 DIAGNOSIS — N170 Acute kidney failure with tubular necrosis: Principal | ICD-10-CM | POA: Diagnosis present

## 2014-12-27 DIAGNOSIS — Z9842 Cataract extraction status, left eye: Secondary | ICD-10-CM

## 2014-12-27 DIAGNOSIS — F0390 Unspecified dementia without behavioral disturbance: Secondary | ICD-10-CM | POA: Diagnosis present

## 2014-12-27 DIAGNOSIS — R569 Unspecified convulsions: Secondary | ICD-10-CM | POA: Diagnosis present

## 2014-12-27 DIAGNOSIS — B9562 Methicillin resistant Staphylococcus aureus infection as the cause of diseases classified elsewhere: Secondary | ICD-10-CM | POA: Diagnosis present

## 2014-12-27 DIAGNOSIS — Z87442 Personal history of urinary calculi: Secondary | ICD-10-CM | POA: Diagnosis not present

## 2014-12-27 DIAGNOSIS — D631 Anemia in chronic kidney disease: Secondary | ICD-10-CM | POA: Diagnosis present

## 2014-12-27 DIAGNOSIS — R4182 Altered mental status, unspecified: Secondary | ICD-10-CM

## 2014-12-27 DIAGNOSIS — I129 Hypertensive chronic kidney disease with stage 1 through stage 4 chronic kidney disease, or unspecified chronic kidney disease: Secondary | ICD-10-CM | POA: Diagnosis present

## 2014-12-27 DIAGNOSIS — A09 Infectious gastroenteritis and colitis, unspecified: Secondary | ICD-10-CM | POA: Diagnosis present

## 2014-12-27 DIAGNOSIS — R1110 Vomiting, unspecified: Secondary | ICD-10-CM

## 2014-12-27 DIAGNOSIS — Z7902 Long term (current) use of antithrombotics/antiplatelets: Secondary | ICD-10-CM | POA: Diagnosis not present

## 2014-12-27 DIAGNOSIS — I959 Hypotension, unspecified: Secondary | ICD-10-CM | POA: Diagnosis present

## 2014-12-27 DIAGNOSIS — E785 Hyperlipidemia, unspecified: Secondary | ICD-10-CM | POA: Diagnosis present

## 2014-12-27 DIAGNOSIS — G2581 Restless legs syndrome: Secondary | ICD-10-CM | POA: Diagnosis present

## 2014-12-27 DIAGNOSIS — M797 Fibromyalgia: Secondary | ICD-10-CM | POA: Diagnosis present

## 2014-12-27 DIAGNOSIS — M1990 Unspecified osteoarthritis, unspecified site: Secondary | ICD-10-CM | POA: Diagnosis present

## 2014-12-27 DIAGNOSIS — N184 Chronic kidney disease, stage 4 (severe): Secondary | ICD-10-CM | POA: Diagnosis present

## 2014-12-27 DIAGNOSIS — Z9841 Cataract extraction status, right eye: Secondary | ICD-10-CM | POA: Diagnosis not present

## 2014-12-27 DIAGNOSIS — Z7982 Long term (current) use of aspirin: Secondary | ICD-10-CM

## 2014-12-27 DIAGNOSIS — G9340 Encephalopathy, unspecified: Secondary | ICD-10-CM | POA: Diagnosis present

## 2014-12-27 DIAGNOSIS — Z8522 Personal history of malignant neoplasm of nasal cavities, middle ear, and accessory sinuses: Secondary | ICD-10-CM | POA: Diagnosis not present

## 2014-12-27 DIAGNOSIS — R197 Diarrhea, unspecified: Secondary | ICD-10-CM

## 2014-12-27 DIAGNOSIS — I69398 Other sequelae of cerebral infarction: Secondary | ICD-10-CM

## 2014-12-27 DIAGNOSIS — E875 Hyperkalemia: Secondary | ICD-10-CM | POA: Diagnosis present

## 2014-12-27 DIAGNOSIS — Z9049 Acquired absence of other specified parts of digestive tract: Secondary | ICD-10-CM

## 2014-12-27 DIAGNOSIS — Z961 Presence of intraocular lens: Secondary | ICD-10-CM | POA: Diagnosis present

## 2014-12-27 DIAGNOSIS — E860 Dehydration: Secondary | ICD-10-CM | POA: Diagnosis present

## 2014-12-27 DIAGNOSIS — E876 Hypokalemia: Secondary | ICD-10-CM | POA: Diagnosis present

## 2014-12-27 DIAGNOSIS — K219 Gastro-esophageal reflux disease without esophagitis: Secondary | ICD-10-CM | POA: Diagnosis present

## 2014-12-27 LAB — CBC WITH DIFFERENTIAL/PLATELET
BASOS ABS: 0 10*3/uL (ref 0–0.1)
BASOS PCT: 0 %
Eosinophils Absolute: 0.4 10*3/uL (ref 0–0.7)
Eosinophils Relative: 3 %
HEMATOCRIT: 33.8 % — AB (ref 35.0–47.0)
Hemoglobin: 11 g/dL — ABNORMAL LOW (ref 12.0–16.0)
LYMPHS PCT: 11 %
Lymphs Abs: 1.3 10*3/uL (ref 1.0–3.6)
MCH: 30.2 pg (ref 26.0–34.0)
MCHC: 32.7 g/dL (ref 32.0–36.0)
MCV: 92.6 fL (ref 80.0–100.0)
MONO ABS: 1 10*3/uL — AB (ref 0.2–0.9)
Monocytes Relative: 8 %
NEUTROS ABS: 9.5 10*3/uL — AB (ref 1.4–6.5)
NEUTROS PCT: 78 %
Platelets: 271 10*3/uL (ref 150–440)
RBC: 3.64 MIL/uL — AB (ref 3.80–5.20)
RDW: 14 % (ref 11.5–14.5)
WBC: 12.2 10*3/uL — AB (ref 3.6–11.0)

## 2014-12-27 LAB — COMPREHENSIVE METABOLIC PANEL
ALT: 14 U/L (ref 14–54)
ANION GAP: 11 (ref 5–15)
AST: 22 U/L (ref 15–41)
Albumin: 4 g/dL (ref 3.5–5.0)
Alkaline Phosphatase: 82 U/L (ref 38–126)
BILIRUBIN TOTAL: 1.3 mg/dL — AB (ref 0.3–1.2)
BUN: 84 mg/dL — AB (ref 6–20)
CHLORIDE: 103 mmol/L (ref 101–111)
CO2: 23 mmol/L (ref 22–32)
Calcium: 9.3 mg/dL (ref 8.9–10.3)
Creatinine, Ser: 5.13 mg/dL — ABNORMAL HIGH (ref 0.44–1.00)
GFR, EST AFRICAN AMERICAN: 8 mL/min — AB (ref >60)
GFR, EST NON AFRICAN AMERICAN: 7 mL/min — AB (ref >60)
Glucose, Bld: 102 mg/dL — ABNORMAL HIGH (ref 65–99)
POTASSIUM: 5.3 mmol/L — AB (ref 3.5–5.1)
Sodium: 137 mmol/L (ref 135–145)
TOTAL PROTEIN: 8.1 g/dL (ref 6.5–8.1)

## 2014-12-27 LAB — URINALYSIS COMPLETE WITH MICROSCOPIC (ARMC ONLY)
BILIRUBIN URINE: NEGATIVE
Glucose, UA: NEGATIVE mg/dL
HGB URINE DIPSTICK: NEGATIVE
KETONES UR: NEGATIVE mg/dL
Nitrite: NEGATIVE
PH: 5 (ref 5.0–8.0)
PROTEIN: NEGATIVE mg/dL
SPECIFIC GRAVITY, URINE: 1.014 (ref 1.005–1.030)

## 2014-12-27 LAB — C DIFFICILE QUICK SCREEN W PCR REFLEX
C DIFFICILE (CDIFF) INTERP: NEGATIVE
C DIFFICILE (CDIFF) TOXIN: NEGATIVE
C Diff antigen: NEGATIVE

## 2014-12-27 LAB — TROPONIN I

## 2014-12-27 MED ORDER — ACETAMINOPHEN 650 MG RE SUPP: 650.0000 mg | Freq: Four times a day (QID) | RECTAL | Status: DC | PRN

## 2014-12-27 MED ORDER — LEVOFLOXACIN IN D5W 750 MG/150ML IV SOLN
750.0000 mg | Freq: Once | INTRAVENOUS | Status: AC
  Administered 2014-12-27: 750 mg via INTRAVENOUS
  Filled 2014-12-27: qty 150

## 2014-12-27 MED ORDER — SODIUM CHLORIDE 0.9 % IJ SOLN
3.0000 mL | Freq: Two times a day (BID) | INTRAMUSCULAR | Status: DC
  Administered 2014-12-27 – 2014-12-28 (×2): 3 mL via INTRAVENOUS

## 2014-12-27 MED ORDER — ONDANSETRON HCL 4 MG PO TABS: 4.0000 mg | ORAL_TABLET | Freq: Four times a day (QID) | ORAL | Status: DC | PRN

## 2014-12-27 MED ORDER — ACETAMINOPHEN 325 MG PO TABS
650.0000 mg | ORAL_TABLET | Freq: Four times a day (QID) | ORAL | Status: DC | PRN
  Administered 2014-12-27: 650 mg via ORAL
  Filled 2014-12-27: qty 2

## 2014-12-27 MED ORDER — ONDANSETRON HCL 4 MG/2ML IJ SOLN: 4.0000 mg | Freq: Four times a day (QID) | INTRAMUSCULAR | Status: DC | PRN

## 2014-12-27 MED ORDER — SODIUM CHLORIDE 0.9 % IV SOLN
Freq: Once | INTRAVENOUS | Status: AC
  Administered 2014-12-27: 16:00:00 via INTRAVENOUS

## 2014-12-27 MED ORDER — DEXTROSE-NACL 5-0.9 % IV SOLN
INTRAVENOUS | Status: AC
  Administered 2014-12-27 – 2014-12-28 (×2): via INTRAVENOUS

## 2014-12-27 MED ORDER — ONDANSETRON HCL 4 MG/2ML IJ SOLN
4.0000 mg | Freq: Once | INTRAMUSCULAR | Status: AC
  Administered 2014-12-27: 4 mg via INTRAVENOUS
  Filled 2014-12-27: qty 2

## 2014-12-27 MED ORDER — HEPARIN SODIUM (PORCINE) 5000 UNIT/ML IJ SOLN
5000.0000 [IU] | Freq: Three times a day (TID) | INTRAMUSCULAR | Status: DC
  Administered 2014-12-27 – 2014-12-31 (×12): 5000 [IU] via SUBCUTANEOUS
  Filled 2014-12-27 (×13): qty 1

## 2014-12-27 MED ORDER — METRONIDAZOLE IN NACL 5-0.79 MG/ML-% IV SOLN
500.0000 mg | Freq: Three times a day (TID) | INTRAVENOUS | Status: DC
  Administered 2014-12-28 – 2014-12-29 (×5): 500 mg via INTRAVENOUS
  Filled 2014-12-27 (×6): qty 100

## 2014-12-27 MED ORDER — SODIUM POLYSTYRENE SULFONATE 15 GM/60ML PO SUSP
30.0000 g | ORAL | Status: AC
  Administered 2014-12-27: 30 g via ORAL
  Filled 2014-12-27: qty 120

## 2014-12-27 NOTE — ED Notes (Signed)
Pt to ultrasound at this time. Son at bedside

## 2014-12-27 NOTE — ED Notes (Signed)
  Amanda Hogan ( son) [phone] cell

## 2014-12-27 NOTE — ED Notes (Signed)
Pt had BM prior to transport.  Pt cleaned and new under garment placed prior to transport.

## 2014-12-27 NOTE — Consult Note (Signed)
ANTIBIOTIC CONSULT NOTE - INITAL  Pharmacy Consult for levofloxacin Indication: intra-abdominal infection  Allergies  Allergen Reactions  . Iohexol Shortness Of Breath and Itching  . Codeine Sulfate Other (See Comments)    Unknown reaction.  . Contrast Media [Iodinated Diagnostic Agents] Other (See Comments)    Reaction:  Unknown     Patient Measurements: Height: 5\' 5"  (165.1 cm) Weight: 189 lb (85.73 kg) IBW/kg (Calculated) : 57 Adjusted Body Weight:   Vital Signs: Temp: 98.3 F (36.8 C) (10/08 1410) Temp Source: Oral (10/08 1410) BP: 90/51 mmHg (10/08 1730) Pulse Rate: 58 (10/08 1730) Intake/Output from previous day:   Intake/Output from this shift:    Labs:  Recent Labs  12/27/14 1436  WBC 12.2*  HGB 11.0*  PLT 271  CREATININE 5.13*   Estimated Creatinine Clearance: 9 mL/min (by C-G formula based on Cr of 5.13). No results for input(s): VANCOTROUGH, VANCOPEAK, VANCORANDOM, GENTTROUGH, GENTPEAK, GENTRANDOM, TOBRATROUGH, TOBRAPEAK, TOBRARND, AMIKACINPEAK, AMIKACINTROU, AMIKACIN in the last 72 hours.   Microbiology: Recent Results (from the past 720 hour(s))  Urine culture     Status: None   Collection Time: 12/16/14  6:31 PM  Result Value Ref Range Status   Specimen Description URINE, CLEAN CATCH  Final   Special Requests NONE  Final   Culture NO GROWTH 1 DAY  Final   Report Status 12/18/2014 FINAL  Final  MRSA PCR Screening     Status: Abnormal   Collection Time: 12/17/14 12:23 AM  Result Value Ref Range Status   MRSA by PCR POSITIVE (A) NEGATIVE Final    Comment: C/ CAROLYN SONBAY @0212  12/17/14.Marland KitchenMarland KitchenAJO        The GeneXpert MRSA Assay (FDA approved for NASAL specimens only), is one component of a comprehensive MRSA colonization surveillance program. It is not intended to diagnose MRSA infection nor to guide or monitor treatment for MRSA infections.   C difficile quick scan w PCR reflex     Status: None   Collection Time: 12/27/14  4:42 PM  Result  Value Ref Range Status   C Diff antigen NEGATIVE NEGATIVE Final   C Diff toxin NEGATIVE NEGATIVE Final   C Diff interpretation Negative for C. difficile  Final    Anti-infectives    Start     Dose/Rate Route Frequency Ordered Stop   12/27/14 1830  metroNIDAZOLE (FLAGYL) IVPB 500 mg     500 mg 100 mL/hr over 60 Minutes Intravenous Every 8 hours 12/27/14 1824        Assessment: Pt is a 81 year old female who presents with AKI, confusion, diarrhea. Pharmacy consulted to dose levofloxacin.   Goal of Therapy:  resolution of infection  Plan:  Follow up culture results Will give 750mg  once. due to patient unsable renal function (scr was 1.69 in sept) will recheck creatine in the AM after rehydration and will redose at that time.  Melissa D Maccia 12/27/2014,6:36 PM

## 2014-12-27 NOTE — ED Notes (Signed)
BIB EMS from [facility]. With reports of generalized weakness for past week. Pt had a stroke a few weeks ago per EMS with left sided weakness that remains. Pt Alert to self which is baseline.

## 2014-12-27 NOTE — H&P (Signed)
Amanda Hogan at [place] NAME: Amanda Hogan    MR#:  [id number]  DATE OF BIRTH:  05-06-1928  DATE OF ADMISSION:  12/27/2014  PRIMARY CARE PHYSICIAN: Delia Chimes, NP   REQUESTING/REFERRING PHYSICIAN: Dr. Jimmye Norman  CHIEF COMPLAINT:  Altered mental status with weakness  HISTORY OF PRESENT ILLNESS:   Amanda Hogan is a 81 year old female with past medical history of hypertension, arthritis, hypercholesterolemia and multiple other medical problems was brought into the ED with a chief complaint of altered mental status and spasms in her left arm. Patient was found to be hypotensive with systolic blood pressure at around 80s. Her potassium is at 5.3 and the creatinine is at 5.1 which was at 1.69 in September 2016. Patient recently had a stroke 2 weeks ago and since then she had been having prolonged episodes of somnolence and incoherent speaking. No family members are available at bedside PAST MEDICAL HISTORY:   Past Medical History  Diagnosis Date  . Hypertension   . Arthritis   . High cholesterol   . Acid reflux   . TIA (transient ischemic attack)   . Restless leg syndrome   . Complication of anesthesia     "w/my back; gave me too much; thought I'd had stroke but didn't"  . Heart murmur   . Stroke [facility])     "said it looked like bullet holes; had a bunch"  . Angina   . Exertional dyspnea   . Sinus malignant neoplasm ([place])   . Fibromyalgia   . Memory disorder, possibly not taking home meds at times 07/22/2011  . Anxiety 02/23/2012  . Dementia 02/23/2012  . Kidney stone   . HTN (hypertension)   . Mild carotid artery disease ([place]) 08/05/11    carotid dopplers, mild disease  . Chest pain     negative lexiscan myoview 53/13, last echo 02/24/12-EF 68-115, mid systolic obliteration of the LV, cavity aortic sclerosis    PAST SURGICAL HISTOIRY:   Past Surgical History  Procedure Laterality Date  . Cholecystectomy  2005  . Vaginal  hysterectomy    . Breast cyst excision      left  . Hernia repair  2007    "stomach"  . Lung surgery      "made 3 holes and pulled gel out"  . Knee cartilage surgery      right  . Lumbar spine surgery      "put box in my lower back"  . Elbow bursa surgery      left  . Cataract extraction w/ intraocular lens  implant, bilateral    . Eye surgery  1940    fixed my crossed eyes"  . Kidney stone surgery      "right side; cut it out"  . Carpal tunnel release      right    SOCIAL HISTORY:   Social History  Substance Use Topics  . Smoking status: Never Smoker   . Smokeless tobacco: Never Used  . Alcohol Use: No    FAMILY HISTORY:   Family History  Problem Relation Age of Onset  . Diabetes Neg Hx     DRUG ALLERGIES:   Allergies  Allergen Reactions  . Iohexol Shortness Of Breath and Itching  . Contrast Media [Iodinated Diagnostic Agents] Other (See Comments)    Reaction:  Unknown     REVIEW OF SYSTEMS:  Review of systems unobtainable as the patient is pleasantly confused  MEDICATIONS AT HOME:   Prior to  Admission medications   Medication Sig Start Date End Date Taking? Authorizing Provider  acetaminophen (TYLENOL) 500 MG tablet Take 1,000 mg by mouth every 6 (six) hours as needed for mild pain or headache.     Historical Provider, MD  ALPRAZolam Duanne Moron) 0.25 MG tablet Take 1 tablet (0.25 mg total) by mouth daily as needed for anxiety. 12/19/14   Henreitta Leber, MD  amLODipine (NORVASC) 10 MG tablet Take 10 mg by mouth daily.    Historical Provider, MD  aspirin EC 81 MG EC tablet Take 1 tablet (81 mg total) by mouth daily. 12/19/14   Henreitta Leber, MD  atorvastatin (LIPITOR) 80 MG tablet Take one tablet by mouth once daily for cholesterol    Historical Provider, MD  celecoxib (CELEBREX) 200 MG capsule Take 200 mg by mouth daily.    Historical Provider, MD  citalopram (CELEXA) 20 MG tablet Take 20 mg by mouth daily.    Historical Provider, MD  clopidogrel (PLAVIX) 75  MG tablet Take 75 mg by mouth daily.    Historical Provider, MD  diclofenac (VOLTAREN) 50 MG EC tablet Take 50 mg by mouth 2 (two) times daily.    Historical Provider, MD  furosemide (LASIX) 20 MG tablet Take 20 mg by mouth daily.    Historical Provider, MD  gabapentin (NEURONTIN) 300 MG capsule Take 300 mg by mouth at bedtime.    Historical Provider, MD  memantine (NAMENDA XR) 28 MG CP24 24 hr capsule Take 28 mg by mouth daily.    Historical Provider, MD  metoprolol succinate (TOPROL-XL) 25 MG 24 hr tablet Take 25 mg by mouth daily.    Historical Provider, MD  nitroGLYCERIN (NITROSTAT) 0.4 MG SL tablet Place 0.4 mg under the tongue every 5 (five) minutes as needed for chest pain.     Historical Provider, MD  pantoprazole (PROTONIX) 40 MG tablet Take 40 mg by mouth 2 (two) times daily.     Historical Provider, MD  QUEtiapine (SEROQUEL) 25 MG tablet Take 1 tablet (25 mg total) by mouth at bedtime. 12/19/14   Henreitta Leber, MD  rOPINIRole (REQUIP) 0.5 MG tablet Take 0.5 mg by mouth at bedtime.    Historical Provider, MD  rOPINIRole (REQUIP) 2 MG tablet Take 2 mg by mouth 3 (three) times daily.     Historical Provider, MD  spironolactone (ALDACTONE) 25 MG tablet Take 25 mg by mouth daily.    Historical Provider, MD  tolterodine (DETROL) 2 MG tablet Take 2 mg by mouth 2 (two) times daily.    Historical Provider, MD  trimethoprim (TRIMPEX) 100 MG tablet Take 100 mg by mouth at bedtime.    Historical Provider, MD      VITAL SIGNS:  Blood pressure 90/51, pulse 58, temperature 98.3 F (36.8 C), temperature source Oral, resp. rate 16, height 5\' 5"  (1.651 m), weight 85.73 kg (189 lb), SpO2 96 %.  PHYSICAL EXAMINATION:  GENERAL:  81 y.o.-year-old patient lying in the bed with no acute distress.  EYES: Pupils equal, round, reactive to light and accommodation. No scleral icterus.  HEENT: Head atraumatic, normocephalic. Oropharynx and nasopharynx clear.  NECK:  Supple, no jugular venous distention. No  thyroid enlargement, no tenderness.  LUNGS: Normal breath sounds bilaterally, no wheezing, rales,rhonchi or crepitation. No use of accessory muscles of respiration.  CARDIOVASCULAR: S1, S2 normal. No murmurs, rubs, or gallops.  ABDOMEN: Soft, nontender, nondistended. Bowel sounds present. No organomegaly EXTREMITIES: No pedal edema, cyanosis, or clubbing.  NEUROLOGIC: Altered mental status  PSYCHIATRIC: The patient is alert and disoriented  SKIN: No obvious rash, lesion, or ulcer.   LABORATORY PANEL:   CBC  Recent Labs Lab 12/27/14 1436  WBC 12.2*  HGB 11.0*  HCT 33.8*  PLT 271   ------------------------------------------------------------------------------------------------------------------  Chemistries   Recent Labs Lab 12/27/14 1436  NA 137  K 5.3*  CL 103  CO2 23  GLUCOSE 102*  BUN 84*  CREATININE 5.13*  CALCIUM 9.3  AST 22  ALT 14  ALKPHOS 82  BILITOT 1.3*   ------------------------------------------------------------------------------------------------------------------  Cardiac Enzymes  Recent Labs Lab 12/27/14 1436  TROPONINI <0.03   ------------------------------------------------------------------------------------------------------------------  RADIOLOGY:  Ct Head Wo Contrast  12/27/2014   CLINICAL DATA:  Generalized weakness, 1 week duration.  EXAM: CT HEAD WITHOUT CONTRAST  TECHNIQUE: Contiguous axial images were obtained from the base of the skull through the vertex without intravenous contrast.  COMPARISON:  12/16/2014 CT.  12/18/2014 MRI.  FINDINGS: No focal abnormality is seen affecting the brainstem. There are a few old small vessel cerebellar infarctions. There is old infarction in the right temporal lobe consistent with right MCA branch vessel territory infarction. There is an old right posterior frontal cortical and subcortical infarction. There are extensive chronic small vessel ischemic changes throughout the deep white matter. Late  subacute infarction in the left basal ganglia was acute on the previous study. No progressive change. No evidence of swelling or hemorrhage. Elsewhere, no evidence of hemorrhage, mass lesion, hydrocephalus or extra-axial collection. No calvarial abnormality. Sinuses, middle ears and mastoids are clear. There is atherosclerotic calcification of the major vessels at the base of the brain.  IMPRESSION: No acute insult. Old right temporal lobe and right posterior frontal lobe cortical and subcortical infarctions. Extensive chronic small vessel ischemic changes throughout the brain. Expected evolutionary changes of a late subacute left basal ganglia stroke. No sign of hemorrhage or mass effect. No new stroke.   Electronically Signed   By: Nelson Chimes M.D.   On: 12/27/2014 16:08    EKG:   Orders placed or performed during the hospital encounter of 12/27/14  . EKG 12-Lead  . EKG 12-Lead  . ED EKG  . ED EKG    IMPRESSION AND PLAN:   1. Altered mental status secondary to dehydration from persistent diarrhea Patient is pleasantly confused Will get neuro checks Provide IV fluids   2. Acute kidney injury 2/2 prerenal leading to ATN secondary to diarrhea Patient's baseline creatinine was at 1.69 on September 28 which is effective 5.1 today Will provide hydration with IV fluids Stat renal ultrasound Will Place  Foley catheter to monitor intake and output Will get nephrology consult if no improvement clinically Avoid nephrotoxins   3. Hyperkalemia 2/2 dehydration Will provide hydration with IV fluids and Kayexalate Repeat labs including potassium  4. Diarrhea  Will rule out C. difficile colitis Will get stool culture and stool for Hemoccult Enteric precaution Start patient on levofloxacin and Flagyl We will consider GI consult if there is no clinical improvement    All the records are reviewed and case discussed with ED provider. Management plans will be discussed with the  family once  they're available  CODE STATUS: Full code until CODE STATUS is determined   TOTAL TIME TAKING CARE OF THIS PATIENT: 45  minutes.    Nicholes Mango M.D on 12/27/2014 at 5:55 PM  Between 7am to 6pm - Pager - [phone]  After 6pm go to [email or web address] - password [facility]  Office  [phone]  CC: Primary care physician; Delia Chimes, NP

## 2014-12-27 NOTE — ED Notes (Signed)
Nurse and tech at bedside to clean pt. Liquid stool noted. MD informed. Stool collected

## 2014-12-27 NOTE — ED Provider Notes (Signed)
[facility] Emergency Department Provider Note     Time seen: ----------------------------------------- 3:15 PM on 12/27/2014 -----------------------------------------  L5 caveat: Review of systems and history cannot be obtained due to altered mental status  I have reviewed the triage vital signs and the nursing notes.   HISTORY  Chief Complaint Weakness    HPI Amanda Hogan is a 81 y.o. female brought the ER by assisted living for spasms in her left arm and decreased mental status. Patient also noted to be hypotensive with a blood pressure in the upper 28B systolic. Patient recently for stroke, prior to the last 2 weeks she was essentially normal. After her stroke she's had prolonged periods of somnolence and speaking incoherently. She presents exhibiting similar symptoms currently.   Past Medical History  Diagnosis Date  . Hypertension   . Arthritis   . High cholesterol   . Acid reflux   . TIA (transient ischemic attack)   . Restless leg syndrome   . Complication of anesthesia     "w/my back; gave me too much; thought I'd had stroke but didn't"  . Heart murmur   . Stroke [facility])     "said it looked like bullet holes; had a bunch"  . Angina   . Exertional dyspnea   . Sinus malignant neoplasm ([place])   . Fibromyalgia   . Memory disorder, possibly not taking home meds at times 07/22/2011  . Anxiety 02/23/2012  . Dementia 02/23/2012  . Kidney stone   . HTN (hypertension)   . Mild carotid artery disease ([place]) 08/05/11    carotid dopplers, mild disease  . Chest pain     negative lexiscan myoview 53/13, last echo 02/24/12-EF 15-176, mid systolic obliteration of the LV, cavity aortic sclerosis    Patient Active Problem List   Diagnosis Date Noted  . Acute CVA (cerebrovascular accident) ([place]) 12/16/2014  . Diastolic dysfunction-grade 2 10/29/2014  . Pulmonary hypertension-moderate 10/29/2014  . Troponin level elevated-suspect demand ischemia from AF  with RVR 10/28/2014  . Lymphoma history 10/28/2014  . Acute diastolic CHF (congestive heart failure) ([place]) 10/27/2014  . Atrial fibrillation with RVR (new)   . TIA (transient ischemic attack) 02/25/2012  . Encephalopathy acute 02/23/2012  . Anemia 02/23/2012  . Chronic back pain 02/23/2012  . Anxiety 02/23/2012  . Mild carotid artery disease ([place]) 08/05/2011  . Memory disorder, possibly not taking home meds at times 07/22/2011  . Hypertension, at times poorly controlled 07/21/2011  . Chest pain-low risk Myoview May 2013 07/21/2011  . Hyperlipidemia 07/21/2011  . Gastroesophageal reflux disease 07/21/2011  . History of TIA (transient ischemic attack) 07/21/2011  . Obesity 07/21/2011    Past Surgical History  Procedure Laterality Date  . Cholecystectomy  2005  . Vaginal hysterectomy    . Breast cyst excision      left  . Hernia repair  2007    "stomach"  . Lung surgery      "made 3 holes and pulled gel out"  . Knee cartilage surgery      right  . Lumbar spine surgery      "put box in my lower back"  . Elbow bursa surgery      left  . Cataract extraction w/ intraocular lens  implant, bilateral    . Eye surgery  1940    fixed my crossed eyes"  . Kidney stone surgery      "right side; cut it out"  . Carpal tunnel release  right    Allergies Iohexol and Contrast media  Social History Social History  Substance Use Topics  . Smoking status: Never Smoker   . Smokeless tobacco: Never Used  . Alcohol Use: No    Review of Systems Constitutional: Negative for fever. Gastrointestinal: Positive for gagging and vomiting Neurological: Positive for altered mental status  Review of systems otherwise unknown  ____________________________________________   PHYSICAL EXAM:  VITAL SIGNS: ED Triage Vitals  Enc Vitals Group     BP 12/27/14 1410 132/53 mmHg     Pulse Rate 12/27/14 1410 67     Resp 12/27/14 1410 18     Temp 12/27/14 1410 98.3 F (36.8 C)     Temp  Source 12/27/14 1410 Oral     SpO2 12/27/14 1410 93 %     Weight 12/27/14 1410 189 lb (85.73 kg)     Height 12/27/14 1410 5\' 5"  (1.651 m)     Head Cir --      Peak Flow --      Pain Score --      Pain Loc --      Pain Edu? --      Excl. in [place]? --     Constitutional: Patient appears very drowsy and is disoriented. Does not appear to be in any acute distress Eyes: Conjunctivae are normal. PERRL. Normal extraocular movements. ENT   Head: Normocephalic and atraumatic.   Nose: No congestion/rhinnorhea.   Mouth/Throat: Mucous membranes are moist.   Neck: No stridor. Cardiovascular: Normal rate, regular rhythm. Normal and symmetric distal pulses are present in all extremities. Slight systolic murmur. Respiratory: Normal respiratory effort without tachypnea nor retractions. Breath sounds are clear and equal bilaterally. No wheezes/rales/rhonchi. Gastrointestinal: Soft and nontender. No distention. No abdominal bruits.  Musculoskeletal: Nontender with normal range of motion in all extremities. No joint effusions.  No lower extremity tenderness nor edema. Neurologic:  Gross weakness, nothing focal. Patient is mumbling incoherently. Skin:  Skin is warm, dry and intact. No rash noted. ____________________________________________  EKG: Interpreted by me. Normal sinus rhythm with normal axis normal intervals. No evidence of hypertrophy or acute infarction. Rate is 67 bpm  CT head IMPRESSION: No acute insult. Old right temporal lobe and right posterior frontal lobe cortical and subcortical infarctions. Extensive chronic small vessel ischemic changes throughout the brain. Expected evolutionary changes of a late subacute left basal ganglia stroke. No sign of hemorrhage or mass effect. No new stroke. ____________________________________________  ED COURSE:  Pertinent labs & imaging results that were available during my care of the patient were reviewed by me and considered in my  medical decision making (see chart for details). Unclear etiology of her symptoms. Recheck brain imaging and basic labs. ____________________________________________    LABS (pertinent positives/negatives)  Labs Reviewed  COMPREHENSIVE METABOLIC PANEL - Abnormal; Notable for the following:    Potassium 5.3 (*)    Glucose, Bld 102 (*)    BUN 84 (*)    Creatinine, Ser 5.13 (*)    Total Bilirubin 1.3 (*)    GFR calc non Af Amer 7 (*)    GFR calc Af Amer 8 (*)    All other components within normal limits  CBC WITH DIFFERENTIAL/PLATELET - Abnormal; Notable for the following:    WBC 12.2 (*)    RBC 3.64 (*)    Hemoglobin 11.0 (*)    HCT 33.8 (*)    Neutro Abs 9.5 (*)    Monocytes Absolute 1.0 (*)    All other  components within normal limits  C DIFFICILE QUICK SCREEN W PCR REFLEX  STOOL CULTURE  TROPONIN I  URINALYSIS COMPLETEWITH MICROSCOPIC ([facility] ONLY)    RADIOLOGY Images were viewed by me  CT head Does not reveal any acute changes ____________________________________________  FINAL ASSESSMENT AND PLAN  Altered mental status, acute renal failure, diarrhea  Plan: Patient with labs and imaging as dictated above. Patient with acute renal failure, unclear etiology at this time. We'll start fluids.patient also found to be having diarrhea which we were not aware of. We'll send for stool culture and check for C. Difficile. She is currently not on any antibiotics. She will need admission at this point remains full code.   Earleen Newport, MD   Earleen Newport, MD 12/27/14 [phone]

## 2014-12-28 LAB — BASIC METABOLIC PANEL
ANION GAP: 9 (ref 5–15)
Anion gap: 7 (ref 5–15)
BUN: 83 mg/dL — ABNORMAL HIGH (ref 6–20)
BUN: 73 mg/dL — AB (ref 6–20)
CALCIUM: 8.2 mg/dL — AB (ref 8.9–10.3)
CHLORIDE: 110 mmol/L (ref 101–111)
CHLORIDE: 114 mmol/L — AB (ref 101–111)
CO2: 20 mmol/L — AB (ref 22–32)
CO2: 20 mmol/L — AB (ref 22–32)
CREATININE: 4.21 mg/dL — AB (ref 0.44–1.00)
Calcium: 8.4 mg/dL — ABNORMAL LOW (ref 8.9–10.3)
Creatinine, Ser: 5.51 mg/dL — ABNORMAL HIGH (ref 0.44–1.00)
GFR calc Af Amer: 10 mL/min — ABNORMAL LOW (ref >60)
GFR calc non Af Amer: 6 mL/min — ABNORMAL LOW (ref >60)
GFR calc non Af Amer: 9 mL/min — ABNORMAL LOW (ref >60)
GFR, EST AFRICAN AMERICAN: 7 mL/min — AB (ref >60)
Glucose, Bld: 129 mg/dL — ABNORMAL HIGH (ref 65–99)
Glucose, Bld: 98 mg/dL (ref 65–99)
POTASSIUM: 4.7 mmol/L (ref 3.5–5.1)
Potassium: 4.5 mmol/L (ref 3.5–5.1)
Sodium: 139 mmol/L (ref 135–145)
Sodium: 141 mmol/L (ref 135–145)

## 2014-12-28 LAB — CBC
HCT: 31.5 % — ABNORMAL LOW (ref 35.0–47.0)
HEMOGLOBIN: 10.3 g/dL — AB (ref 12.0–16.0)
MCH: 30.4 pg (ref 26.0–34.0)
MCHC: 32.8 g/dL (ref 32.0–36.0)
MCV: 92.7 fL (ref 80.0–100.0)
Platelets: 236 10*3/uL (ref 150–440)
RBC: 3.4 MIL/uL — AB (ref 3.80–5.20)
RDW: 14.1 % (ref 11.5–14.5)
WBC: 11.7 10*3/uL — ABNORMAL HIGH (ref 3.6–11.0)

## 2014-12-28 LAB — MRSA PCR SCREENING: MRSA BY PCR: POSITIVE — AB

## 2014-12-28 LAB — C DIFFICILE QUICK SCREEN W PCR REFLEX
C DIFFICILE (CDIFF) INTERP: NEGATIVE
C DIFFICILE (CDIFF) TOXIN: NEGATIVE
C Diff antigen: NEGATIVE

## 2014-12-28 LAB — OCCULT BLOOD X 1 CARD TO LAB, STOOL: Fecal Occult Bld: POSITIVE — AB

## 2014-12-28 MED ORDER — CIPROFLOXACIN IN D5W 400 MG/200ML IV SOLN
400.0000 mg | INTRAVENOUS | Status: DC
  Administered 2014-12-28: 400 mg via INTRAVENOUS
  Filled 2014-12-28 (×2): qty 200

## 2014-12-28 MED ORDER — ROPINIROLE HCL 1 MG PO TABS
2.0000 mg | ORAL_TABLET | Freq: Three times a day (TID) | ORAL | Status: DC
  Administered 2014-12-28 – 2014-12-31 (×9): 2 mg via ORAL
  Filled 2014-12-28 (×11): qty 2

## 2014-12-28 MED ORDER — SODIUM CHLORIDE 0.9 % IV SOLN
INTRAVENOUS | Status: AC
  Administered 2014-12-28 – 2014-12-29 (×3): via INTRAVENOUS

## 2014-12-28 MED ORDER — LEVOFLOXACIN IN D5W 500 MG/100ML IV SOLN: 500.0000 mg | INTRAVENOUS | Status: DC

## 2014-12-28 NOTE — Progress Notes (Signed)
[place] at [place] NAME: Amanda Hogan    MR#:  [id number]  DATE OF BIRTH:  August 24, 1928  SUBJECTIVE:  CHIEF COMPLAINT:   Chief Complaint  Patient presents with  . Weakness   Sleepy and disoriented. No complaints. No family at bedside  REVIEW OF SYSTEMS:   ROS unable to provide due to dementia  DRUG ALLERGIES:   Allergies  Allergen Reactions  . Iohexol Shortness Of Breath and Itching  . Codeine Sulfate Other (See Comments)    Unknown reaction.  . Contrast Media [Iodinated Diagnostic Agents] Other (See Comments)    Reaction:  Unknown     VITALS:  Blood pressure 112/52, pulse 64, temperature 97.3 F (36.3 C), temperature source Axillary, resp. rate 16, height 5\' 5"  (1.651 m), weight 85.73 kg (189 lb), SpO2 100 %.  PHYSICAL EXAMINATION:  GENERAL:  81 y.o.-year-old patient lying in the bed with no acute distress. Sleepy EYES: Pupils equal, round, reactive to light and accommodation. No scleral icterus. Extraocular muscles intact.  HEENT: Head atraumatic, normocephalic. Oropharynx and nasopharynx clear.  NECK:  Supple, no jugular venous distention. No thyroid enlargement, no tenderness.  LUNGS: Normal breath sounds bilaterally, no wheezing, rales,rhonchi or crepitation. No use of accessory muscles of respiration.  CARDIOVASCULAR: S1, S2 normal. No murmurs, rubs, or gallops.  ABDOMEN: Soft, nontender, distended. Bowel sounds present. No organomegaly or mass.  EXTREMITIES: No pedal edema, cyanosis, or clubbing.  NEUROLOGIC: Cranial nerves II through XII are intact. Muscle strength 5/5 in all extremities. Sensation intact. Gait not checked.  PSYCHIATRIC: The patient is alert and oriented x 3.  SKIN: No obvious rash, lesion, or ulcer.    LABORATORY PANEL:   CBC  Recent Labs Lab 12/28/14 0437  WBC 11.7*  HGB 10.3*  HCT 31.5*  PLT 236    ------------------------------------------------------------------------------------------------------------------  Chemistries   Recent Labs Lab 12/27/14 1436 12/28/14 0437  NA 137 139  K 5.3* 4.7  CL 103 110  CO2 23 20*  GLUCOSE 102* 129*  BUN 84* 83*  CREATININE 5.13* 5.51*  CALCIUM 9.3 8.4*  AST 22  --   ALT 14  --   ALKPHOS 82  --   BILITOT 1.3*  --    ------------------------------------------------------------------------------------------------------------------  Cardiac Enzymes  Recent Labs Lab 12/27/14 1436  TROPONINI <0.03   ------------------------------------------------------------------------------------------------------------------  RADIOLOGY:  Ct Head Wo Contrast  12/27/2014   CLINICAL DATA:  Generalized weakness, 1 week duration.  EXAM: CT HEAD WITHOUT CONTRAST  TECHNIQUE: Contiguous axial images were obtained from the base of the skull through the vertex without intravenous contrast.  COMPARISON:  12/16/2014 CT.  12/18/2014 MRI.  FINDINGS: No focal abnormality is seen affecting the brainstem. There are a few old small vessel cerebellar infarctions. There is old infarction in the right temporal lobe consistent with right MCA branch vessel territory infarction. There is an old right posterior frontal cortical and subcortical infarction. There are extensive chronic small vessel ischemic changes throughout the deep white matter. Late subacute infarction in the left basal ganglia was acute on the previous study. No progressive change. No evidence of swelling or hemorrhage. Elsewhere, no evidence of hemorrhage, mass lesion, hydrocephalus or extra-axial collection. No calvarial abnormality. Sinuses, middle ears and mastoids are clear. There is atherosclerotic calcification of the major vessels at the base of the brain.  IMPRESSION: No acute insult. Old right temporal lobe and right posterior frontal lobe cortical and subcortical infarctions. Extensive chronic small  vessel ischemic changes throughout the  brain. Expected evolutionary changes of a late subacute left basal ganglia stroke. No sign of hemorrhage or mass effect. No new stroke.   Electronically Signed   By: Nelson Chimes M.D.   On: 12/27/2014 16:08   US Renal  12/27/2014   CLINICAL DATA:  Acute onset of renal insufficiency. Initial encounter.  EXAM: RENAL / URINARY TRACT ULTRASOUND COMPLETE  COMPARISON:  CT of the abdomen and pelvis performed 11/30/2012  FINDINGS: Right Kidney:  Length: 10.3 cm. Echogenicity within normal limits. No mass or hydronephrosis visualized.  Left Kidney:  Length: 9.4 cm. Echogenicity within normal limits. No mass or hydronephrosis visualized.  Bladder:  Appears normal for degree of bladder distention.  IMPRESSION: Unremarkable renal ultrasound.   Electronically Signed   By: Garald Balding M.D.   On: 12/27/2014 19:08    EKG:   Orders placed or performed during the hospital encounter of 12/27/14  . EKG 12-Lead  . EKG 12-Lead  . ED EKG  . ED EKG    ASSESSMENT AND PLAN:   #1 Acute kidney failure on chronic kidney disease - Has worsened overnight, will continue IV fluids - Is likely due to ATN secondary to diarrhea, decreased by mouth intake and initial hypotension - Nephrology following  #2 altered mental status - Has dementia and is oriented to self at baseline - There has been some question of seizure activity due to left-sided spasticity, we'll consult neurology for EEG - Seems to be returning to baseline today  #3 hyperkalemia - Due to renal failure, hyperkalemia is improving, continue fluids  #4 diarrhea - C differential negative, stool culture pending Flagyl started, no further diarrhea - We'll discontinue Levaquin and start Cipro  CODE STATUS: Full   TOTAL TIME TAKING CARE OF THIS PATIENT: 25 minutes.  Greater than 50% of time spent in care coordination and counseling. POSSIBLE D/C IN 2-3 DAYS, DEPENDING ON CLINICAL CONDITION.   Myrtis Ser M.D  on 12/28/2014 at 2:08 PM  Between 7am to 6pm - Pager - [phone]  After 6pm go to [email or web address] - password [email or web address] [facility]  [place] [facility]  Office  [phone]  CC: Primary care physician; Delia Chimes, NP

## 2014-12-28 NOTE — Progress Notes (Signed)
Patient is admitted to room 255 with a diagnosis of Acute Kidney injury. Patient is alert to self. No acute distress noted.Contact precaution for MRSA in place. Skin assessment done with Cristopher Peru RN, no  Skin skin issues noted except a bruise on the L. Upper thigh. Patient is NPO.  5 %  Dextrose in NS running  At 125 ml/hr, no issues. Patient is still having diarrhea. Will continue to monitor.

## 2014-12-28 NOTE — Consult Note (Signed)
Reason for Consult: seizure Referring Physician: Dr. Henrene Pastor Amanda Hogan is an 81 y.o. female.  HPI: 81 yo RHD F presents to [facility] due to altered mental status.  Pt already has poor baseline which is known to me to be only a few words from her dementia.  She is also very anxious.  There is a concern for possible seizure as cause of increased confusion from primary.  There is mention of some L arm spascity as well.  Past Medical History  Diagnosis Date  . Hypertension   . Arthritis   . High cholesterol   . Acid reflux   . TIA (transient ischemic attack)   . Restless leg syndrome   . Complication of anesthesia     "w/my back; gave me too much; thought I'd had stroke but didn't"  . Heart murmur   . Stroke [facility])     "said it looked like bullet holes; had a bunch"  . Angina   . Exertional dyspnea   . Sinus malignant neoplasm ([place])   . Fibromyalgia   . Memory disorder, possibly not taking home meds at times 07/22/2011  . Anxiety 02/23/2012  . Dementia 02/23/2012  . Kidney stone   . HTN (hypertension)   . Mild carotid artery disease ([place]) 08/05/11    carotid dopplers, mild disease  . Chest pain     negative lexiscan myoview 53/13, last echo 02/24/12-EF 70-350, mid systolic obliteration of the LV, cavity aortic sclerosis    Past Surgical History  Procedure Laterality Date  . Cholecystectomy  2005  . Vaginal hysterectomy    . Breast cyst excision      left  . Hernia repair  2007    "stomach"  . Lung surgery      "made 3 holes and pulled gel out"  . Knee cartilage surgery      right  . Lumbar spine surgery      "put box in my lower back"  . Elbow bursa surgery      left  . Cataract extraction w/ intraocular lens  implant, bilateral    . Eye surgery  1940    fixed my crossed eyes"  . Kidney stone surgery      "right side; cut it out"  . Carpal tunnel release      right    Family History  Problem Relation Age of Onset  . Diabetes Neg Hx     Social History:  reports  that she has never smoked. She has never used smokeless tobacco. She reports that she does not drink alcohol or use illicit drugs.  Allergies:  Allergies  Allergen Reactions  . Iohexol Shortness Of Breath and Itching  . Codeine Sulfate Other (See Comments)    Unknown reaction.  . Contrast Media [Iodinated Diagnostic Agents] Other (See Comments)    Reaction:  Unknown     Medications: Reviewed by me as per chart  Results for orders placed or performed during the hospital encounter of 12/27/14 (from the past 48 hour(s))  Culture, blood (routine x 2)     Status: None (Preliminary result)   Collection Time: 12/27/14  2:19 PM  Result Value Ref Range   Specimen Description BLOOD LEFT ASSIST CONTROL    Special Requests BOTTLES DRAWN AEROBIC AND ANAEROBIC 4CC    Culture NO GROWTH < 12 HOURS    Report Status PENDING   Comprehensive metabolic panel     Status: Abnormal   Collection Time: 12/27/14  2:36  PM  Result Value Ref Range   Sodium 137 135 - 145 mmol/L   Potassium 5.3 (H) 3.5 - 5.1 mmol/L   Chloride 103 101 - 111 mmol/L   CO2 23 22 - 32 mmol/L   Glucose, Bld 102 (H) 65 - 99 mg/dL   BUN 84 (H) 6 - 20 mg/dL   Creatinine, Ser 5.13 (H) 0.44 - 1.00 mg/dL   Calcium 9.3 8.9 - 10.3 mg/dL   Total Protein 8.1 6.5 - 8.1 g/dL   Albumin 4.0 3.5 - 5.0 g/dL   AST 22 15 - 41 U/L   ALT 14 14 - 54 U/L   Alkaline Phosphatase 82 38 - 126 U/L   Total Bilirubin 1.3 (H) 0.3 - 1.2 mg/dL   GFR calc non Af Amer 7 (L) >60 mL/min   GFR calc Af Amer 8 (L) >60 mL/min    Comment: (NOTE) The eGFR has been calculated using the CKD EPI equation. This calculation has not been validated in all clinical situations. eGFR's persistently <60 mL/min signify possible Chronic Kidney Disease.    Anion gap 11 5 - 15  Troponin I     Status: None   Collection Time: 12/27/14  2:36 PM  Result Value Ref Range   Troponin I <0.03 <0.031 ng/mL    Comment:        NO INDICATION OF MYOCARDIAL INJURY.   CBC with  Differential     Status: Abnormal   Collection Time: 12/27/14  2:36 PM  Result Value Ref Range   WBC 12.2 (H) 3.6 - 11.0 K/uL   RBC 3.64 (L) 3.80 - 5.20 MIL/uL   Hemoglobin 11.0 (L) 12.0 - 16.0 g/dL   HCT 33.8 (L) 35.0 - 47.0 %   MCV 92.6 80.0 - 100.0 fL   MCH 30.2 26.0 - 34.0 pg   MCHC 32.7 32.0 - 36.0 g/dL   RDW 14.0 11.5 - 14.5 %   Platelets 271 150 - 440 K/uL   Neutrophils Relative % 78 %   Neutro Abs 9.5 (H) 1.4 - 6.5 K/uL   Lymphocytes Relative 11 %   Lymphs Abs 1.3 1.0 - 3.6 K/uL   Monocytes Relative 8 %   Monocytes Absolute 1.0 (H) 0.2 - 0.9 K/uL   Eosinophils Relative 3 %   Eosinophils Absolute 0.4 0 - 0.7 K/uL   Basophils Relative 0 %   Basophils Absolute 0.0 0 - 0.1 K/uL  Urinalysis complete, with microscopic     Status: Abnormal   Collection Time: 12/27/14  4:42 PM  Result Value Ref Range   Color, Urine YELLOW (A) YELLOW   APPearance HAZY (A) CLEAR   Glucose, UA NEGATIVE NEGATIVE mg/dL   Bilirubin Urine NEGATIVE NEGATIVE   Ketones, ur NEGATIVE NEGATIVE mg/dL   Specific Gravity, Urine 1.014 1.005 - 1.030   Hgb urine dipstick NEGATIVE NEGATIVE   pH 5.0 5.0 - 8.0   Protein, ur NEGATIVE NEGATIVE mg/dL   Nitrite NEGATIVE NEGATIVE   Leukocytes, UA TRACE (A) NEGATIVE   RBC / HPF 0-5 0 - 5 RBC/hpf   WBC, UA 0-5 0 - 5 WBC/hpf   Bacteria, UA MANY (A) NONE SEEN   Squamous Epithelial / LPF 0-5 (A) NONE SEEN   Amorphous Crystal PRESENT   C difficile quick scan w PCR reflex     Status: None   Collection Time: 12/27/14  4:42 PM  Result Value Ref Range   C Diff antigen NEGATIVE NEGATIVE   C Diff toxin NEGATIVE NEGATIVE  C Diff interpretation Negative for C. difficile   Stool culture     Status: None (Preliminary result)   Collection Time: 12/27/14  4:42 PM  Result Value Ref Range   Specimen Description STOOL    Special Requests Normal    Culture      NO SALMONELLA OR SHIGELLA ISOLATED NO CAMPYLOBACTER DETECTED No Pathogenic E. coli detected    Report Status  PENDING   Culture, blood (routine x 2)     Status: None (Preliminary result)   Collection Time: 12/27/14  7:03 PM  Result Value Ref Range   Specimen Description BLOOD RIGHT HAND    Special Requests BOTTLES DRAWN AEROBIC AND ANAEROBIC 4CC    Culture NO GROWTH < 12 HOURS    Report Status PENDING   Urine culture     Status: None (Preliminary result)   Collection Time: 12/27/14 11:40 PM  Result Value Ref Range   Specimen Description URINE, CATHETERIZED    Special Requests Immunocompromised    Culture NO GROWTH < 12 HOURS    Report Status PENDING   Basic metabolic panel     Status: Abnormal   Collection Time: 12/28/14  4:37 AM  Result Value Ref Range   Sodium 139 135 - 145 mmol/L   Potassium 4.7 3.5 - 5.1 mmol/L   Chloride 110 101 - 111 mmol/L   CO2 20 (L) 22 - 32 mmol/L   Glucose, Bld 129 (H) 65 - 99 mg/dL   BUN 83 (H) 6 - 20 mg/dL   Creatinine, Ser 5.51 (H) 0.44 - 1.00 mg/dL   Calcium 8.4 (L) 8.9 - 10.3 mg/dL   GFR calc non Af Amer 6 (L) >60 mL/min   GFR calc Af Amer 7 (L) >60 mL/min    Comment: (NOTE) The eGFR has been calculated using the CKD EPI equation. This calculation has not been validated in all clinical situations. eGFR's persistently <60 mL/min signify possible Chronic Kidney Disease.    Anion gap 9 5 - 15  CBC     Status: Abnormal   Collection Time: 12/28/14  4:37 AM  Result Value Ref Range   WBC 11.7 (H) 3.6 - 11.0 K/uL   RBC 3.40 (L) 3.80 - 5.20 MIL/uL   Hemoglobin 10.3 (L) 12.0 - 16.0 g/dL   HCT 31.5 (L) 35.0 - 47.0 %   MCV 92.7 80.0 - 100.0 fL   MCH 30.4 26.0 - 34.0 pg   MCHC 32.8 32.0 - 36.0 g/dL   RDW 14.1 11.5 - 14.5 %   Platelets 236 150 - 440 K/uL  MRSA PCR Screening     Status: Abnormal   Collection Time: 12/28/14  5:30 AM  Result Value Ref Range   MRSA by PCR POSITIVE (A) NEGATIVE    Comment:        The GeneXpert MRSA Assay (FDA approved for NASAL specimens only), is one component of a comprehensive MRSA colonization surveillance program. It  is not intended to diagnose MRSA infection nor to guide or monitor treatment for MRSA infections. CRITICAL RESULT CALLED TO, READ BACK BY AND VERIFIED WITH: TAMMY TODD,RN 12/28/2014 0856 BY JRS   Occult blood card to lab, stool RN will collect     Status: Abnormal   Collection Time: 12/28/14  6:35 AM  Result Value Ref Range   Fecal Occult Bld POSITIVE (A) NEGATIVE  C difficile quick scan w PCR reflex     Status: None   Collection Time: 12/28/14  6:35 AM  Result Value Ref  Range   C Diff antigen NEGATIVE NEGATIVE   C Diff toxin NEGATIVE NEGATIVE   C Diff interpretation Negative for C. difficile   Basic metabolic panel     Status: Abnormal   Collection Time: 12/28/14  2:16 PM  Result Value Ref Range   Sodium 141 135 - 145 mmol/L   Potassium 4.5 3.5 - 5.1 mmol/L   Chloride 114 (H) 101 - 111 mmol/L   CO2 20 (L) 22 - 32 mmol/L   Glucose, Bld 98 65 - 99 mg/dL   BUN 73 (H) 6 - 20 mg/dL   Creatinine, Ser 4.21 (H) 0.44 - 1.00 mg/dL   Calcium 8.2 (L) 8.9 - 10.3 mg/dL   GFR calc non Af Amer 9 (L) >60 mL/min   GFR calc Af Amer 10 (L) >60 mL/min    Comment: (NOTE) The eGFR has been calculated using the CKD EPI equation. This calculation has not been validated in all clinical situations. eGFR's persistently <60 mL/min signify possible Chronic Kidney Disease.    Anion gap 7 5 - 15    Ct Head Wo Contrast  12/27/2014   CLINICAL DATA:  Generalized weakness, 1 week duration.  EXAM: CT HEAD WITHOUT CONTRAST  TECHNIQUE: Contiguous axial images were obtained from the base of the skull through the vertex without intravenous contrast.  COMPARISON:  12/16/2014 CT.  12/18/2014 MRI.  FINDINGS: No focal abnormality is seen affecting the brainstem. There are a few old small vessel cerebellar infarctions. There is old infarction in the right temporal lobe consistent with right MCA branch vessel territory infarction. There is an old right posterior frontal cortical and subcortical infarction. There are  extensive chronic small vessel ischemic changes throughout the deep white matter. Late subacute infarction in the left basal ganglia was acute on the previous study. No progressive change. No evidence of swelling or hemorrhage. Elsewhere, no evidence of hemorrhage, mass lesion, hydrocephalus or extra-axial collection. No calvarial abnormality. Sinuses, middle ears and mastoids are clear. There is atherosclerotic calcification of the major vessels at the base of the brain.  IMPRESSION: No acute insult. Old right temporal lobe and right posterior frontal lobe cortical and subcortical infarctions. Extensive chronic small vessel ischemic changes throughout the brain. Expected evolutionary changes of a late subacute left basal ganglia stroke. No sign of hemorrhage or mass effect. No new stroke.   Electronically Signed   By: Nelson Chimes M.D.   On: 12/27/2014 16:08   US Renal  12/27/2014   CLINICAL DATA:  Acute onset of renal insufficiency. Initial encounter.  EXAM: RENAL / URINARY TRACT ULTRASOUND COMPLETE  COMPARISON:  CT of the abdomen and pelvis performed 11/30/2012  FINDINGS: Right Kidney:  Length: 10.3 cm. Echogenicity within normal limits. No mass or hydronephrosis visualized.  Left Kidney:  Length: 9.4 cm. Echogenicity within normal limits. No mass or hydronephrosis visualized.  Bladder:  Appears normal for degree of bladder distention.  IMPRESSION: Unremarkable renal ultrasound.   Electronically Signed   By: Garald Balding M.D.   On: 12/27/2014 19:08    Review of Systems  Unable to perform ROS: dementia   Blood pressure 112/49, pulse 72, temperature 97.9 F (36.6 C), temperature source Oral, resp. rate 18, height _0  (1.651 m), weight 85.73 kg (189 lb), SpO2 95 %. Physical Exam  Constitutional: She appears well-developed and well-nourished. No distress.  HENT:  Head: Normocephalic and atraumatic.  Right Ear: External ear normal.  Left Ear: External ear normal.  Nose: Nose normal.   Mouth/Throat: Oropharynx  is clear and moist.  Eyes: Conjunctivae and EOM are normal. Pupils are equal, round, and reactive to light.  Neck: Normal range of motion. Neck supple.  Cardiovascular: Normal rate, regular rhythm, normal heart sounds and intact distal pulses.   Respiratory: Effort normal and breath sounds normal.  GI: Soft. Bowel sounds are normal.  Musculoskeletal: Normal range of motion.  Neurological: She is alert.  Alert but oriented to person only, follows simple commands, no dysarthria PERRLA, EOMI, nl VF, face symmetric 5-/5 B, nl tone 0/4 B, mute plantars Intact to light touch  Skin: Skin is warm. She is not diaphoretic.  Psychiatric: Her mood appears anxious. She is withdrawn.   CT of head personally reviewed by me and shows R temporal encephalomalacia and severe white matter changes  Assessment/Plan: 1.  Encephalopathy-  This is cause of pts confusion that has been intermittent and likely due to acute renal failure and infection; no signs or concern for seizure;  Increased tone on L could be from old stroke but is not present on my exam;  Increased tone can be seen with pts who have dementia as well 2.  Old R temporal stroke-  Stable 3.  Dementia-  Stable, likely multi-infarct -  No need for EEG -  No need for anti-epileptics -  Correct underlying renal dysfunction -  Look for infections -  Continue ASA, plavix and statin -  Will sign off, please call with questions   Amilio Zehnder 12/28/2014, 8:52 PM

## 2014-12-28 NOTE — Consult Note (Signed)
ANTIBIOTIC CONSULT NOTE - INITAL  Pharmacy Consult for levofloxacin Indication: intra-abdominal infection  Allergies  Allergen Reactions  . Iohexol Shortness Of Breath and Itching  . Codeine Sulfate Other (See Comments)    Unknown reaction.  . Contrast Media [Iodinated Diagnostic Agents] Other (See Comments)    Reaction:  Unknown     Patient Measurements: Height: 5\' 5"  (165.1 cm) Weight: 189 lb (85.73 kg) IBW/kg (Calculated) : 57 Adjusted Body Weight:   Vital Signs: Temp: 97.4 F (36.3 C) (10/09 0441) Temp Source: Oral (10/09 0441) BP: 105/49 mmHg (10/09 0444) Pulse Rate: 62 (10/09 0444) Intake/Output from previous day: 10/08 0701 - 10/09 0700 In: -  Out: 1600 [Urine:1600] Intake/Output from this shift: Total I/O In: -  Out: 1600 [Urine:1600]  Labs:  Recent Labs  12/27/14 1436 12/28/14 0437  WBC 12.2* 11.7*  HGB 11.0* 10.3*  PLT 271 236  CREATININE 5.13* 5.51*   Estimated Creatinine Clearance: 8.4 mL/min (by C-G formula based on Cr of 5.51). No results for input(s): VANCOTROUGH, VANCOPEAK, VANCORANDOM, GENTTROUGH, GENTPEAK, GENTRANDOM, TOBRATROUGH, TOBRAPEAK, TOBRARND, AMIKACINPEAK, AMIKACINTROU, AMIKACIN in the last 72 hours.   Microbiology: Recent Results (from the past 720 hour(s))  Urine culture     Status: None   Collection Time: 12/16/14  6:31 PM  Result Value Ref Range Status   Specimen Description URINE, CLEAN CATCH  Final   Special Requests NONE  Final   Culture NO GROWTH 1 DAY  Final   Report Status 12/18/2014 FINAL  Final  MRSA PCR Screening     Status: Abnormal   Collection Time: 12/17/14 12:23 AM  Result Value Ref Range Status   MRSA by PCR POSITIVE (A) NEGATIVE Final    Comment: C/ CAROLYN SONBAY @0212  12/17/14.Marland KitchenMarland KitchenAJO        The GeneXpert MRSA Assay (FDA approved for NASAL specimens only), is one component of a comprehensive MRSA colonization surveillance program. It is not intended to diagnose MRSA infection nor to guide or monitor  treatment for MRSA infections.   C difficile quick scan w PCR reflex     Status: None   Collection Time: 12/27/14  4:42 PM  Result Value Ref Range Status   C Diff antigen NEGATIVE NEGATIVE Final   C Diff toxin NEGATIVE NEGATIVE Final   C Diff interpretation Negative for C. difficile  Final    Anti-infectives    Start     Dose/Rate Route Frequency Ordered Stop   12/27/14 1845  levofloxacin (LEVAQUIN) IVPB 750 mg     750 mg 100 mL/hr over 90 Minutes Intravenous  Once 12/27/14 1840 12/27/14 2052   12/27/14 1830  metroNIDAZOLE (FLAGYL) IVPB 500 mg     500 mg 100 mL/hr over 60 Minutes Intravenous Every 8 hours 12/27/14 1824        Assessment: Pt is a 81 year old female who presents with AKI, confusion, diarrhea. Pharmacy consulted to dose levofloxacin.   Goal of Therapy:  resolution of infection  Plan:  Follow up culture results Will give 750mg  once. due to patient unsable renal function (scr was 1.69 in sept) will recheck creatine in the AM after rehydration and will redose at that time.  10/9 AM CrCl ~8. 500 mg IV q 48 hours ordered as continuation. Will continue to monitor and adjust as needed.  Dwain Huhn S 12/28/2014,5:25 AM

## 2014-12-28 NOTE — Clinical Social Work Note (Signed)
Clinical Social Work Assessment  Patient Details  Name: Amanda Hogan MRN: [id number] Date of Birth: 02/20/1929  Date of referral:  12/28/14               Reason for consult:   (from [facility])                Permission sought to share information with:  Facility Sport and exercise psychologist, Family Supports Permission granted to share information::  Yes, Verbal Permission Granted  Name::      (son Dominica Severin [phone])  Agency::     Relationship::     Contact Information:     Housing/Transportation Living arrangements for the past 2 months:  [place], [place] of Information:  Adult Children Patient Interpreter Needed:  None Criminal Activity/Legal Involvement Pertinent to Current Situation/Hospitalization:  No - Comment as needed Significant Relationships:  Adult Children Lives with:  Self, Facility Resident (prior to SNF) Do you feel safe going back to the place where you live?  No (son states patient unable to live alone, ) Need for family participation in patient care:  Yes (Comment) (family is assisting with ALF placement)  Care giving concerns:  None at this time   Social Worker assessment / plan:  Patient is 81 year old female that has spent the last 10 days in [facility] for rehab.  Patient is alert to self, was lethargic and not engaged in any dialoged with CSW however ok for CSW to call son.  Note on board states to call Dominica Severin.  Call to Dominica Severin, patient's son [phone].  States patient lived alone prior to going to rehab several times.  They realize she can no longer live at home alone.  Family met with [facility] last week, the plan was for patient to transition from [facility] to [place] this week prior to her returning to the hospital.  Patient uses a rolling walker at baseline and if pleasantly confused.  Son will contact [place] on Monday to see if patient is able to transition to ALF from [facility].   CSW will place FL2 on chart in  anticipation of patient returning to SNF/  or to ALF  Employment status:    Insurance information:    PT Recommendations:  Not assessed at this time Information / Referral to community resources:   (none at this time)  Patient/Family's Response to care:  Son is in agreement with plan.  Patient/Family's Understanding of and Emotional Response to Diagnosis, Current Treatment, and Prognosis:  Son understands patient will remain in the hospital for continued medical workup will discharge to [facility] if possible or will return to [facility].  Emotional Assessment Appearance:  Appears stated age Attitude/Demeanor/Rapport:  Lethargic Affect (typically observed):  Unable to Assess Orientation:  Oriented to Self Alcohol / Substance use:  Never Used Psych involvement (Current and /or in the community):  No (Comment)  Discharge Needs  Concerns to be addressed:  No discharge needs identified Readmission within the last 30 days:  Yes Current discharge risk:  Chronically ill, Dependent with Mobility Barriers to Discharge:  No Barriers Identified   Maurine Cane, LCSW 12/28/2014, 4:34 PM Casimer Lanius. Latanya Presser, MSW Clinical Social Work Department Emergency Room [phone] 4:36 PM

## 2014-12-28 NOTE — Consult Note (Signed)
[facility]  CONSULT NOTE    Date: 12/28/2014                  Patient Name:  Amanda Hogan  MRN: [id number]  DOB: 1928-10-15  Age / Sex: 81 y.o., female         PCP: Delia Chimes, NP                 Service Requesting Consult: Dr. Margaretmary Eddy                 Reason for Consult: Acute renal failure            History of Present Illness: Amanda Hogan is a 81 y.o. white female with hypertension, osteoarthritis, hyperlipidemia, fibromyalgia, dementia who was admitted to [facility] on 12/27/2014 for AKI (acute kidney injury) ([place]) [N17.9] Acute renal failure, unspecified acute renal failure type ([place]) [N17.9] Altered mental status, unspecified altered mental status type [R41.82] Diarrhea, unspecified type [R19.7]  Patient's family is at bedside and give history. Patient is unable to give history. She is oriented to self only currently.  Patient was initially admitted on 9/27 to 9/30 for acute stroke - left basal ganglia. She was sent to [facility] for rehab. Patient's creatinine on discharge of 1.69, eGFR of 27. On admission, patient's creatinine of 5.13, trended up to 5.51.  Family witnessed spasms in her left arm that they were concerned for a seizure. She was found to be hypotensive. She reportedly has been having diarrhea.   Started on IV fluids, D5NS at 189m, urine output of 17566m   Medications: Outpatient medications: Prescriptions prior to admission  Medication Sig Dispense Refill Last Dose  . acetaminophen (TYLENOL) 500 MG tablet Take 1,000 mg by mouth every 6 (six) hours as needed for mild pain or headache.    prn  . ALPRAZolam (XANAX) 0.25 MG tablet Take 1 tablet (0.25 mg total) by mouth daily as needed for anxiety. 30 tablet 0 12/20/2014  . amLODipine (NORVASC) 10 MG tablet Take 10 mg by mouth daily.   12/26/2014 at Unknown time  . aspirin (ASPIRIN CHILDRENS) 81 MG chewable tablet Chew 81 mg by mouth every morning.   12/26/2014 at Unknown time  .  atorvastatin (LIPITOR) 80 MG tablet Take one tablet by mouth once daily for cholesterol   12/25/2014 at Unknown time  . celecoxib (CELEBREX) 200 MG capsule Take 200 mg by mouth daily.   12/26/2014 at Unknown time  . clopidogrel (PLAVIX) 75 MG tablet Take 75 mg by mouth daily.   12/26/2014 at Unknown time  . diclofenac (VOLTAREN) 50 MG EC tablet Take 50 mg by mouth 2 (two) times daily.   12/26/2014 at Unknown time  . furosemide (LASIX) 20 MG tablet Take 20 mg by mouth daily.   12/26/2014 at Unknown time  . gabapentin (NEURONTIN) 300 MG capsule Take 300 mg by mouth at bedtime.   12/25/2014  . memantine (NAMENDA XR) 28 MG CP24 24 hr capsule Take 28 mg by mouth daily.   12/26/2014 at Unknown time  . metoprolol succinate (TOPROL-XL) 25 MG 24 hr tablet Take 25 mg by mouth daily.   12/26/2014 at 0908  . nitroGLYCERIN (NITROSTAT) 0.4 MG SL tablet Place 0.4 mg under the tongue every 5 (five) minutes as needed for chest pain.    prn  . pantoprazole (PROTONIX) 40 MG tablet Take 40 mg by mouth 2 (two) times daily.    12/26/2014 at Unknown time  . QUEtiapine (  SEROQUEL) 25 MG tablet Take 1 tablet (25 mg total) by mouth at bedtime. (Patient taking differently: Take 25 mg by mouth every evening. )   12/25/2014  . rOPINIRole (REQUIP) 0.5 MG tablet Take 0.5 mg by mouth at bedtime.   12/25/2014  . rOPINIRole (REQUIP) 2 MG tablet Take 2 mg by mouth 3 (three) times daily.    12/26/2014 at Unknown time  . spironolactone (ALDACTONE) 25 MG tablet Take 25 mg by mouth daily.   12/26/2014 at Unknown time  . tolterodine (DETROL) 2 MG tablet Take 2 mg by mouth 2 (two) times daily.   12/26/2014 at Unknown time  . trimethoprim (TRIMPEX) 100 MG tablet Take 100 mg by mouth at bedtime.   12/25/2014 at Unknown time  . aspirin EC 81 MG EC tablet Take 1 tablet (81 mg total) by mouth daily. 30 tablet  Taking    Current medications: Current Facility-Administered Medications  Medication Dose Route Frequency Provider Last Rate Last Dose  .  acetaminophen (TYLENOL) tablet 650 mg  650 mg Oral Q6H PRN Nicholes Mango, MD   650 mg at 12/27/14 2115   Or  . acetaminophen (TYLENOL) suppository 650 mg  650 mg Rectal Q6H PRN Aruna Gouru, MD      . dextrose 5 %-0.9 % sodium chloride infusion   Intravenous Continuous Nicholes Mango, MD 125 mL/hr at 12/28/14 0552    . heparin injection 5,000 Units  5,000 Units Subcutaneous 3 times per day Nicholes Mango, MD   5,000 Units at 12/28/14 0500  . [START ON 12/29/2014] levofloxacin (LEVAQUIN) IVPB 500 mg  500 mg Intravenous Q48H Aruna Gouru, MD      . metroNIDAZOLE (FLAGYL) IVPB 500 mg  500 mg Intravenous Q8H Aruna Gouru, MD   500 mg at 12/28/14 1055  . ondansetron (ZOFRAN) tablet 4 mg  4 mg Oral Q6H PRN Nicholes Mango, MD       Or  . ondansetron (ZOFRAN) injection 4 mg  4 mg Intravenous Q6H PRN Aruna Gouru, MD      . sodium chloride 0.9 % injection 3 mL  3 mL Intravenous Q12H Nicholes Mango, MD   3 mL at 12/27/14 2126      Allergies: Allergies  Allergen Reactions  . Iohexol Shortness Of Breath and Itching  . Codeine Sulfate Other (See Comments)    Unknown reaction.  . Contrast Media [Iodinated Diagnostic Agents] Other (See Comments)    Reaction:  Unknown       Past Medical History: Past Medical History  Diagnosis Date  . Hypertension   . Arthritis   . High cholesterol   . Acid reflux   . TIA (transient ischemic attack)   . Restless leg syndrome   . Complication of anesthesia     "w/my back; gave me too much; thought I'd had stroke but didn't"  . Heart murmur   . Stroke [facility])     "said it looked like bullet holes; had a bunch"  . Angina   . Exertional dyspnea   . Sinus malignant neoplasm ([place])   . Fibromyalgia   . Memory disorder, possibly not taking home meds at times 07/22/2011  . Anxiety 02/23/2012  . Dementia 02/23/2012  . Kidney stone   . HTN (hypertension)   . Mild carotid artery disease ([place]) 08/05/11    carotid dopplers, mild disease  . Chest pain     negative lexiscan myoview 53/13,  last echo 02/24/12-EF 27-253, mid systolic obliteration of the LV, cavity aortic sclerosis  Past Surgical History: Past Surgical History  Procedure Laterality Date  . Cholecystectomy  2005  . Vaginal hysterectomy    . Breast cyst excision      left  . Hernia repair  2007    "stomach"  . Lung surgery      "made 3 holes and pulled gel out"  . Knee cartilage surgery      right  . Lumbar spine surgery      "put box in my lower back"  . Elbow bursa surgery      left  . Cataract extraction w/ intraocular lens  implant, bilateral    . Eye surgery  1940    fixed my crossed eyes"  . Kidney stone surgery      "right side; cut it out"  . Carpal tunnel release      right     Family History: Family History  Problem Relation Age of Onset  . Diabetes Neg Hx      Social History: Social History   Social History  . Marital Status: Widowed    Spouse Name: N/A  . Number of Children: N/A  . Years of Education: N/A   Occupational History  . Not on file.   Social History Main Topics  . Smoking status: Never Smoker   . Smokeless tobacco: Never Used  . Alcohol Use: No  . Drug Use: No  . Sexual Activity: No   Other Topics Concern  . Not on file   Social History Narrative     Review of Systems: Review of Systems  Unable to perform ROS: dementia    Vital Signs: Blood pressure 112/52, pulse 64, temperature 97.3 F (36.3 C), temperature source Axillary, resp. rate 16, height _0  (1.651 m), weight 85.73 kg (189 lb), SpO2 100 %.  Weight trends: Filed Weights   12/27/14 1410  Weight: 85.73 kg (189 lb)    Physical Exam: General: NAD, laying in bed  Head: Normocephalic, atraumatic. Dry oral mucosal membranes  Eyes: Anicteric, PERRL  Neck: Supple, trachea midline  Lungs:  Clear to auscultation  Heart: Regular rate and rhythm  Abdomen:  Soft, nontender,   Extremities: no peripheral edema.  Neurologic: Nonfocal, moving all four extremities  Skin: No lesions   GU: foley     Lab results: Basic Metabolic Panel:  Recent Labs Lab 12/27/14 1436 12/28/14 0437  NA 137 139  K 5.3* 4.7  CL 103 110  CO2 23 20*  GLUCOSE 102* 129*  BUN 84* 83*  CREATININE 5.13* 5.51*  CALCIUM 9.3 8.4*    Liver Function Tests:  Recent Labs Lab 12/27/14 1436  AST 22  ALT 14  ALKPHOS 82  BILITOT 1.3*  PROT 8.1  ALBUMIN 4.0   No results for input(s): LIPASE, AMYLASE in the last 168 hours. No results for input(s): AMMONIA in the last 168 hours.  CBC:  Recent Labs Lab 12/27/14 1436 12/28/14 0437  WBC 12.2* 11.7*  NEUTROABS 9.5*  --   HGB 11.0* 10.3*  HCT 33.8* 31.5*  MCV 92.6 92.7  PLT 271 236    Cardiac Enzymes:  Recent Labs Lab 12/27/14 1436  TROPONINI <0.03    BNP: Invalid input(s): POCBNP  CBG: No results for input(s): GLUCAP in the last 168 hours.  Microbiology: Results for orders placed or performed during the hospital encounter of 12/27/14  Culture, blood (routine x 2)     Status: None (Preliminary result)   Collection Time: 12/27/14  2:19 PM  Result Value  Ref Range Status   Specimen Description BLOOD LEFT ASSIST CONTROL  Final   Special Requests BOTTLES DRAWN AEROBIC AND ANAEROBIC 4CC  Final   Culture NO GROWTH < 12 HOURS  Final   Report Status PENDING  Incomplete  C difficile quick scan w PCR reflex     Status: None   Collection Time: 12/27/14  4:42 PM  Result Value Ref Range Status   C Diff antigen NEGATIVE NEGATIVE Final   C Diff toxin NEGATIVE NEGATIVE Final   C Diff interpretation Negative for C. difficile  Final  Stool culture     Status: None (Preliminary result)   Collection Time: 12/27/14  4:42 PM  Result Value Ref Range Status   Specimen Description STOOL  Final   Special Requests Normal  Final   Culture   Final    NO SALMONELLA OR SHIGELLA ISOLATED NO CAMPYLOBACTER DETECTED No Pathogenic E. coli detected    Report Status PENDING  Incomplete  Culture, blood (routine x 2)     Status: None  (Preliminary result)   Collection Time: 12/27/14  7:03 PM  Result Value Ref Range Status   Specimen Description BLOOD RIGHT HAND  Final   Special Requests BOTTLES DRAWN AEROBIC AND ANAEROBIC 4CC  Final   Culture NO GROWTH < 12 HOURS  Final   Report Status PENDING  Incomplete  Urine culture     Status: None (Preliminary result)   Collection Time: 12/27/14 11:40 PM  Result Value Ref Range Status   Specimen Description URINE, CATHETERIZED  Final   Special Requests Immunocompromised  Final   Culture NO GROWTH < 12 HOURS  Final   Report Status PENDING  Incomplete  MRSA PCR Screening     Status: Abnormal   Collection Time: 12/28/14  5:30 AM  Result Value Ref Range Status   MRSA by PCR POSITIVE (A) NEGATIVE Final    Comment:        The GeneXpert MRSA Assay (FDA approved for NASAL specimens only), is one component of a comprehensive MRSA colonization surveillance program. It is not intended to diagnose MRSA infection nor to guide or monitor treatment for MRSA infections. CRITICAL RESULT CALLED TO, READ BACK BY AND VERIFIED WITH: TAMMY TODD,RN 12/28/2014 0856 BY JRS   C difficile quick scan w PCR reflex     Status: None   Collection Time: 12/28/14  6:35 AM  Result Value Ref Range Status   C Diff antigen NEGATIVE NEGATIVE Final   C Diff toxin NEGATIVE NEGATIVE Final   C Diff interpretation Negative for C. difficile  Final    Coagulation Studies: No results for input(s): LABPROT, INR in the last 72 hours.  Urinalysis:  Recent Labs  12/27/14 1642  COLORURINE YELLOW*  LABSPEC 1.014  PHURINE 5.0  GLUCOSEU NEGATIVE  HGBUR NEGATIVE  BILIRUBINUR NEGATIVE  KETONESUR NEGATIVE  PROTEINUR NEGATIVE  NITRITE NEGATIVE  LEUKOCYTESUR TRACE*      Imaging: Ct Head Wo Contrast  12/27/2014   CLINICAL DATA:  Generalized weakness, 1 week duration.  EXAM: CT HEAD WITHOUT CONTRAST  TECHNIQUE: Contiguous axial images were obtained from the base of the skull through the vertex without  intravenous contrast.  COMPARISON:  12/16/2014 CT.  12/18/2014 MRI.  FINDINGS: No focal abnormality is seen affecting the brainstem. There are a few old small vessel cerebellar infarctions. There is old infarction in the right temporal lobe consistent with right MCA branch vessel territory infarction. There is an old right posterior frontal cortical and subcortical infarction. There  are extensive chronic small vessel ischemic changes throughout the deep white matter. Late subacute infarction in the left basal ganglia was acute on the previous study. No progressive change. No evidence of swelling or hemorrhage. Elsewhere, no evidence of hemorrhage, mass lesion, hydrocephalus or extra-axial collection. No calvarial abnormality. Sinuses, middle ears and mastoids are clear. There is atherosclerotic calcification of the major vessels at the base of the brain.  IMPRESSION: No acute insult. Old right temporal lobe and right posterior frontal lobe cortical and subcortical infarctions. Extensive chronic small vessel ischemic changes throughout the brain. Expected evolutionary changes of a late subacute left basal ganglia stroke. No sign of hemorrhage or mass effect. No new stroke.   Electronically Signed   By: Nelson Chimes M.D.   On: 12/27/2014 16:08   US Renal  12/27/2014   CLINICAL DATA:  Acute onset of renal insufficiency. Initial encounter.  EXAM: RENAL / URINARY TRACT ULTRASOUND COMPLETE  COMPARISON:  CT of the abdomen and pelvis performed 11/30/2012  FINDINGS: Right Kidney:  Length: 10.3 cm. Echogenicity within normal limits. No mass or hydronephrosis visualized.  Left Kidney:  Length: 9.4 cm. Echogenicity within normal limits. No mass or hydronephrosis visualized.  Bladder:  Appears normal for degree of bladder distention.  IMPRESSION: Unremarkable renal ultrasound.   Electronically Signed   By: Garald Balding M.D.   On: 12/27/2014 19:08      Assessment & Plan: Amanda Hogan is a 81 y.o. white female  with hypertension, osteoarthritis, hyperlipidemia, fibromyalgia, dementia who was admitted to [facility] on 12/27/2014 for acute renal failure  1. Acute renal failure on chronic kidney disease stage IV with hyperkalemia: seems to be secondary to prerenal azotemia. Poor po intake and diarrhea. Urine output is nonoliguric. Baseline creatinine of 1.61, eGFR of 27 on 9/28. [place] now improved Hypovolemic on examination - Creatinine slightly worsening. - Continue to monitor volume status, urine output, serum electrolytes and renal function. No acute indication for dialysis at this time - Hold NSAIDs.   2. Hypertension: hypotensive on examination.  - home regimen of spironolactone, metoprolol, amlodipine.    LOS: [place], [place] 10/9/201611:39 AM

## 2014-12-29 LAB — BASIC METABOLIC PANEL
Anion gap: 8 (ref 5–15)
BUN: 55 mg/dL — AB (ref 6–20)
CHLORIDE: 117 mmol/L — AB (ref 101–111)
CO2: 20 mmol/L — AB (ref 22–32)
Calcium: 8.7 mg/dL — ABNORMAL LOW (ref 8.9–10.3)
Creatinine, Ser: 3.01 mg/dL — ABNORMAL HIGH (ref 0.44–1.00)
GFR calc Af Amer: 15 mL/min — ABNORMAL LOW (ref >60)
GFR calc non Af Amer: 13 mL/min — ABNORMAL LOW (ref >60)
GLUCOSE: 91 mg/dL (ref 65–99)
POTASSIUM: 4.2 mmol/L (ref 3.5–5.1)
SODIUM: 145 mmol/L (ref 135–145)

## 2014-12-29 LAB — STOOL CULTURE: SPECIAL REQUESTS: NORMAL

## 2014-12-29 LAB — CBC
HEMATOCRIT: 32.2 % — AB (ref 35.0–47.0)
HEMOGLOBIN: 10.7 g/dL — AB (ref 12.0–16.0)
MCH: 31.2 pg (ref 26.0–34.0)
MCHC: 33.3 g/dL (ref 32.0–36.0)
MCV: 93.8 fL (ref 80.0–100.0)
Platelets: 232 10*3/uL (ref 150–440)
RBC: 3.44 MIL/uL — AB (ref 3.80–5.20)
RDW: 14.2 % (ref 11.5–14.5)
WBC: 9.4 10*3/uL (ref 3.6–11.0)

## 2014-12-29 LAB — URINE CULTURE

## 2014-12-29 MED ORDER — FUROSEMIDE 20 MG PO TABS
20.0000 mg | ORAL_TABLET | Freq: Every day | ORAL | Status: DC
  Administered 2014-12-29: 20 mg via ORAL
  Filled 2014-12-29: qty 1

## 2014-12-29 MED ORDER — CIPROFLOXACIN HCL 250 MG PO TABS
250.0000 mg | ORAL_TABLET | Freq: Two times a day (BID) | ORAL | Status: DC
  Administered 2014-12-30 (×3): 250 mg via ORAL
  Filled 2014-12-29 (×3): qty 1

## 2014-12-29 MED ORDER — SODIUM CHLORIDE 0.9 % IV SOLN
INTRAVENOUS | Status: DC
  Administered 2014-12-29 – 2014-12-31 (×3): via INTRAVENOUS

## 2014-12-29 MED ORDER — MEMANTINE HCL ER 14 MG PO CP24
28.0000 mg | ORAL_CAPSULE | Freq: Every day | ORAL | Status: DC
  Administered 2014-12-29 – 2014-12-30 (×2): 28 mg via ORAL
  Filled 2014-12-29 (×2): qty 2
  Filled 2014-12-29 (×2): qty 1
  Filled 2014-12-29: qty 2
  Filled 2014-12-29: qty 1

## 2014-12-29 MED ORDER — ASPIRIN 81 MG PO CHEW
81.0000 mg | CHEWABLE_TABLET | ORAL | Status: DC
  Administered 2014-12-30 – 2014-12-31 (×2): 81 mg via ORAL
  Filled 2014-12-29 (×2): qty 1

## 2014-12-29 MED ORDER — AMLODIPINE BESYLATE 10 MG PO TABS
10.0000 mg | ORAL_TABLET | Freq: Every day | ORAL | Status: DC
  Administered 2014-12-29 – 2014-12-31 (×3): 10 mg via ORAL
  Filled 2014-12-29 (×4): qty 1

## 2014-12-29 MED ORDER — ATORVASTATIN CALCIUM 20 MG PO TABS
20.0000 mg | ORAL_TABLET | Freq: Every day | ORAL | Status: DC
  Administered 2014-12-29 – 2014-12-31 (×3): 20 mg via ORAL
  Filled 2014-12-29 (×3): qty 1

## 2014-12-29 MED ORDER — PANTOPRAZOLE SODIUM 40 MG PO TBEC
40.0000 mg | DELAYED_RELEASE_TABLET | Freq: Two times a day (BID) | ORAL | Status: DC
  Administered 2014-12-29 – 2014-12-31 (×5): 40 mg via ORAL
  Filled 2014-12-29 (×7): qty 1

## 2014-12-29 MED ORDER — CLOPIDOGREL BISULFATE 75 MG PO TABS
75.0000 mg | ORAL_TABLET | Freq: Every day | ORAL | Status: DC
  Administered 2014-12-29 – 2014-12-31 (×3): 75 mg via ORAL
  Filled 2014-12-29 (×4): qty 1

## 2014-12-29 MED ORDER — QUETIAPINE FUMARATE 25 MG PO TABS
25.0000 mg | ORAL_TABLET | Freq: Every day | ORAL | Status: DC
  Administered 2014-12-30 (×2): 25 mg via ORAL
  Filled 2014-12-29 (×3): qty 1

## 2014-12-29 MED ORDER — METOPROLOL SUCCINATE ER 25 MG PO TB24
25.0000 mg | ORAL_TABLET | Freq: Every day | ORAL | Status: DC
Start: 2014-12-29 — End: 2015-01-01
  Administered 2014-12-29 – 2014-12-31 (×3): 25 mg via ORAL
  Filled 2014-12-29 (×4): qty 1

## 2014-12-29 MED ORDER — METRONIDAZOLE 500 MG PO TABS
500.0000 mg | ORAL_TABLET | Freq: Three times a day (TID) | ORAL | Status: DC
  Administered 2014-12-29 – 2014-12-31 (×5): 500 mg via ORAL
  Filled 2014-12-29 (×5): qty 1

## 2014-12-29 NOTE — Consult Note (Signed)
ANTIBIOTIC CONSULT NOTE - INITAL  Pharmacy Consult for levofloxacin Indication: intra-abdominal infection  Allergies  Allergen Reactions  . Iohexol Shortness Of Breath and Itching  . Codeine Sulfate Other (See Comments)    Unknown reaction.  . Contrast Media [Iodinated Diagnostic Agents] Other (See Comments)    Reaction:  Unknown     Patient Measurements: Height: 5\' 5"  (165.1 cm) Weight: 189 lb (85.73 kg) IBW/kg (Calculated) : 57 Adjusted Body Weight:   Vital Signs: Temp: 97.9 F (36.6 C) (10/09 2035) Temp Source: Oral (10/09 2035) BP: 112/49 mmHg (10/09 2035) Pulse Rate: 72 (10/09 2035) Intake/Output from previous day: 10/09 0701 - 10/10 0700 In: 877.5 [I.V.:377.5; IV Piggyback:500] Out: 500 [Urine:500] Intake/Output from this shift: Total I/O In: 500 [IV Piggyback:500] Out: -   Labs:  Recent Labs  12/27/14 1436 12/28/14 0437 12/28/14 1416 12/29/14 0413  WBC 12.2* 11.7*  --  9.4  HGB 11.0* 10.3*  --  10.7*  PLT 271 236  --  232  CREATININE 5.13* 5.51* 4.21* 3.01*   Estimated Creatinine Clearance: 15.3 mL/min (by C-G formula based on Cr of 3.01). No results for input(s): VANCOTROUGH, VANCOPEAK, VANCORANDOM, GENTTROUGH, GENTPEAK, GENTRANDOM, TOBRATROUGH, TOBRAPEAK, TOBRARND, AMIKACINPEAK, AMIKACINTROU, AMIKACIN in the last 72 hours.   Microbiology: Recent Results (from the past 720 hour(s))  Urine culture     Status: None   Collection Time: 12/16/14  6:31 PM  Result Value Ref Range Status   Specimen Description URINE, CLEAN CATCH  Final   Special Requests NONE  Final   Culture NO GROWTH 1 DAY  Final   Report Status 12/18/2014 FINAL  Final  MRSA PCR Screening     Status: Abnormal   Collection Time: 12/17/14 12:23 AM  Result Value Ref Range Status   MRSA by PCR POSITIVE (A) NEGATIVE Final    Comment: C/ CAROLYN SONBAY @0212  12/17/14.Marland KitchenMarland KitchenAJO        The GeneXpert MRSA Assay (FDA approved for NASAL specimens only), is one component of a comprehensive MRSA  colonization surveillance program. It is not intended to diagnose MRSA infection nor to guide or monitor treatment for MRSA infections.   Culture, blood (routine x 2)     Status: None (Preliminary result)   Collection Time: 12/27/14  2:19 PM  Result Value Ref Range Status   Specimen Description BLOOD LEFT ASSIST CONTROL  Final   Special Requests BOTTLES DRAWN AEROBIC AND ANAEROBIC 4CC  Final   Culture NO GROWTH < 12 HOURS  Final   Report Status PENDING  Incomplete  C difficile quick scan w PCR reflex     Status: None   Collection Time: 12/27/14  4:42 PM  Result Value Ref Range Status   C Diff antigen NEGATIVE NEGATIVE Final   C Diff toxin NEGATIVE NEGATIVE Final   C Diff interpretation Negative for C. difficile  Final  Stool culture     Status: None (Preliminary result)   Collection Time: 12/27/14  4:42 PM  Result Value Ref Range Status   Specimen Description STOOL  Final   Special Requests Normal  Final   Culture   Final    NO SALMONELLA OR SHIGELLA ISOLATED NO CAMPYLOBACTER DETECTED No Pathogenic E. coli detected    Report Status PENDING  Incomplete  Culture, blood (routine x 2)     Status: None (Preliminary result)   Collection Time: 12/27/14  7:03 PM  Result Value Ref Range Status   Specimen Description BLOOD RIGHT HAND  Final   Special Requests BOTTLES DRAWN  AEROBIC AND ANAEROBIC 4CC  Final   Culture NO GROWTH < 12 HOURS  Final   Report Status PENDING  Incomplete  Urine culture     Status: None (Preliminary result)   Collection Time: 12/27/14 11:40 PM  Result Value Ref Range Status   Specimen Description URINE, CATHETERIZED  Final   Special Requests Immunocompromised  Final   Culture NO GROWTH < 12 HOURS  Final   Report Status PENDING  Incomplete  MRSA PCR Screening     Status: Abnormal   Collection Time: 12/28/14  5:30 AM  Result Value Ref Range Status   MRSA by PCR POSITIVE (A) NEGATIVE Final    Comment:        The GeneXpert MRSA Assay (FDA approved for NASAL  specimens only), is one component of a comprehensive MRSA colonization surveillance program. It is not intended to diagnose MRSA infection nor to guide or monitor treatment for MRSA infections. CRITICAL RESULT CALLED TO, READ BACK BY AND VERIFIED WITH: TAMMY TODD,RN 12/28/2014 0856 BY JRS   C difficile quick scan w PCR reflex     Status: None   Collection Time: 12/28/14  6:35 AM  Result Value Ref Range Status   C Diff antigen NEGATIVE NEGATIVE Final   C Diff toxin NEGATIVE NEGATIVE Final   C Diff interpretation Negative for C. difficile  Final    Anti-infectives    Start     Dose/Rate Route Frequency Ordered Stop   12/29/14 1800  levofloxacin (LEVAQUIN) IVPB 500 mg  Status:  Discontinued     500 mg 100 mL/hr over 60 Minutes Intravenous Every 48 hours 12/28/14 0529 12/28/14 1415   12/28/14 1500  ciprofloxacin (CIPRO) IVPB 400 mg     400 mg 200 mL/hr over 60 Minutes Intravenous Every 24 hours 12/28/14 1415     12/27/14 1845  levofloxacin (LEVAQUIN) IVPB 750 mg     750 mg 100 mL/hr over 90 Minutes Intravenous  Once 12/27/14 1840 12/27/14 2052   12/27/14 1830  metroNIDAZOLE (FLAGYL) IVPB 500 mg     500 mg 100 mL/hr over 60 Minutes Intravenous Every 8 hours 12/27/14 1824        Assessment: Pt is a 81 year old female who presents with AKI, confusion, diarrhea. Pharmacy consulted to dose levofloxacin.   Goal of Therapy:  resolution of infection  Plan:  Follow up culture results Will give 750mg  once. due to patient unsable renal function (scr was 1.69 in sept) will recheck creatine in the AM after rehydration and will redose at that time.  10/9 AM CrCl ~8. 500 mg IV q 48 hours ordered as continuation. Will continue to monitor and adjust as needed.  10/10 AM SCr improved but CrCl still <20 mL/min. Continue current regimen  Lossie Kalp S 12/29/2014,5:22 AM

## 2014-12-29 NOTE — Progress Notes (Signed)
[facility] [place] NAME: Amanda Hogan    MR#:  [id number]  DATE OF BIRTH:  10-12-1928  SUBJECTIVE:  CHIEF COMPLAINT:   Chief Complaint  Patient presents with  . Weakness   Much more alert this morning. Sitting up. Would like to eat. No complaints. No diarrhea  REVIEW OF SYSTEMS:   Review of Systems  Constitutional: Negative for fever.  Respiratory: Negative for shortness of breath.   Cardiovascular: Negative for chest pain and palpitations.  Gastrointestinal: Negative for nausea, vomiting and abdominal pain.  Genitourinary: Negative for dysuria.    DRUG ALLERGIES:   Allergies  Allergen Reactions  . Iohexol Shortness Of Breath and Itching  . Codeine Sulfate Other (See Comments)    Unknown reaction.  . Contrast Media [Iodinated Diagnostic Agents] Other (See Comments)    Reaction:  Unknown     VITALS:  Blood pressure 152/58, pulse 87, temperature 98.5 F (36.9 C), temperature source Oral, resp. rate 18, height 5\' 5"  (1.651 m), weight 85.73 kg (189 lb), SpO2 98 %.  PHYSICAL EXAMINATION:  GENERAL:  81 y.o.-year-old patient lying in the bed with no acute distress.  EYES: Pupils equal, round, reactive to light and accommodation. No scleral icterus. Extraocular muscles intact.  HEENT: Head atraumatic, normocephalic. Oropharynx and nasopharynx clear.  NECK:  Supple, no jugular venous distention. No thyroid enlargement, no tenderness.  LUNGS: Normal breath sounds bilaterally, no wheezing, rales,rhonchi or crepitation. No use of accessory muscles of respiration.  CARDIOVASCULAR: S1, S2 normal. No murmurs, rubs, or gallops.  ABDOMEN: Soft, nontender, distended. Bowel sounds present. No organomegaly or mass.  EXTREMITIES: No pedal edema, cyanosis, or clubbing.  NEUROLOGIC: Cranial nerves II through XII are intact. Muscle strength 5/5 in all extremities. Sensation intact. Gait not checked.  PSYCHIATRIC: The patient is alert and  oriented to self only  SKIN: No obvious rash, lesion, or ulcer.    LABORATORY PANEL:   CBC  Recent Labs Lab 12/29/14 0413  WBC 9.4  HGB 10.7*  HCT 32.2*  PLT 232   ------------------------------------------------------------------------------------------------------------------  Chemistries   Recent Labs Lab 12/27/14 1436  12/29/14 0413  NA 137  < > 145  K 5.3*  < > 4.2  CL 103  < > 117*  CO2 23  < > 20*  GLUCOSE 102*  < > 91  BUN 84*  < > 55*  CREATININE 5.13*  < > 3.01*  CALCIUM 9.3  < > 8.7*  AST 22  --   --   ALT 14  --   --   ALKPHOS 82  --   --   BILITOT 1.3*  --   --   < > = values in this interval not displayed. ------------------------------------------------------------------------------------------------------------------  Cardiac Enzymes  Recent Labs Lab 12/27/14 1436  TROPONINI <0.03   ------------------------------------------------------------------------------------------------------------------  RADIOLOGY:  Ct Head Wo Contrast  12/27/2014   CLINICAL DATA:  Generalized weakness, 1 week duration.  EXAM: CT HEAD WITHOUT CONTRAST  TECHNIQUE: Contiguous axial images were obtained from the base of the skull through the vertex without intravenous contrast.  COMPARISON:  12/16/2014 CT.  12/18/2014 MRI.  FINDINGS: No focal abnormality is seen affecting the brainstem. There are a few old small vessel cerebellar infarctions. There is old infarction in the right temporal lobe consistent with right MCA branch vessel territory infarction. There is an old right posterior frontal cortical and subcortical infarction. There are extensive chronic small vessel ischemic changes throughout the deep white matter.  Late subacute infarction in the left basal ganglia was acute on the previous study. No progressive change. No evidence of swelling or hemorrhage. Elsewhere, no evidence of hemorrhage, mass lesion, hydrocephalus or extra-axial collection. No calvarial abnormality.  Sinuses, middle ears and mastoids are clear. There is atherosclerotic calcification of the major vessels at the base of the brain.  IMPRESSION: No acute insult. Old right temporal lobe and right posterior frontal lobe cortical and subcortical infarctions. Extensive chronic small vessel ischemic changes throughout the brain. Expected evolutionary changes of a late subacute left basal ganglia stroke. No sign of hemorrhage or mass effect. No new stroke.   Electronically Signed   By: Nelson Chimes M.D.   On: 12/27/2014 16:08   US Renal  12/27/2014   CLINICAL DATA:  Acute onset of renal insufficiency. Initial encounter.  EXAM: RENAL / URINARY TRACT ULTRASOUND COMPLETE  COMPARISON:  CT of the abdomen and pelvis performed 11/30/2012  FINDINGS: Right Kidney:  Length: 10.3 cm. Echogenicity within normal limits. No mass or hydronephrosis visualized.  Left Kidney:  Length: 9.4 cm. Echogenicity within normal limits. No mass or hydronephrosis visualized.  Bladder:  Appears normal for degree of bladder distention.  IMPRESSION: Unremarkable renal ultrasound.   Electronically Signed   By: Garald Balding M.D.   On: 12/27/2014 19:08    EKG:   Orders placed or performed during the hospital encounter of 12/27/14  . EKG 12-Lead  . EKG 12-Lead  . ED EKG  . ED EKG    ASSESSMENT AND PLAN:   #1 Acute kidney failure on chronic kidney disease - likely due to ATN secondary to diarrhea, decreased by mouth intake and initial hypotension - Nephrology following - improving with fluids  #2 altered mental status - improving - appreciate neurology consultation, no further seizure activity noted  #3 hyperkalemia: resolved - Due to renal failure, hyperkalemia is improving, continue fluids  #4 diarrhea - C differential negative, stool culture pending Flagyl started, no further diarrhea - continue Cipro  CODE STATUS: Full   TOTAL TIME TAKING CARE OF THIS PATIENT: 25 minutes.  Greater than 50% of time spent in care  coordination and counseling. POSSIBLE D/C IN 2-3 DAYS, DEPENDING ON CLINICAL CONDITION.   Myrtis Ser M.D on 12/29/2014 at 3:33 PM  Between 7am to 6pm - Pager - [phone]  After 6pm go to [email or web address] - password [email or web address] [facility]  [place] [facility]  Office  [phone]  CC: Primary care physician; Delia Chimes, NP

## 2014-12-29 NOTE — Progress Notes (Signed)
PT Cancellation Note  Patient Details Name: Amanda Hogan MRN: [id number] DOB: 02-20-1929   Cancelled Treatment:    Reason Eval/Treat Not Completed: Patient's level of consciousness. Chart reviewed and RN consulted. Attempted to evaluate pt. Upon arrival pt has pulled IV out of arm and RN is attempting to give patient medications. Pt is refusing to swallow her pills. Pt refusing to get up with therapist. AOx1 and cannot provide DOB. Will attempt evaluation on later date and time as pt is appropriate and able to participate with therapy.    Lyndel Safe Huprich PT, DPT   Huprich,Jason 12/29/2014, 3:22 PM

## 2014-12-29 NOTE — Progress Notes (Signed)
[facility]  ROUNDING NOTE   Subjective:  Pt confused this AM.  Cr down to 3.01.  BUN down to 55. Sitting up in chair.  Objective:  Vital signs in last 24 hours:  Temp:  [97.8 F (36.6 C)-98.5 F (36.9 C)] 98.5 F (36.9 C) (10/10 1234) Pulse Rate:  [67-87] 87 (10/10 1234) Resp:  [18-20] 18 (10/10 1234) BP: (112-152)/(49-64) 152/58 mmHg (10/10 1234) SpO2:  [94 %-99 %] 98 % (10/10 1234)  Weight change:  Filed Weights   12/27/14 1410  Weight: 85.73 kg (189 lb)    Intake/Output: I/O last 3 completed shifts: In: 3346.3 [I.V.:2496.3; Other:250; IV Piggyback:600] Out: 6063 [Urine:3850]   Intake/Output this shift:  Total I/O In: 100 [IV Piggyback:100] Out: 500 [Urine:500]  Physical Exam: General: NAD, elderly.  Head: Normocephalic, atraumatic. Moist oral mucosal membranes  Eyes: Anicteric  Neck: Supple, trachea midline  Lungs:  Mild basilar rales  Heart: S1S2 no rubs  Abdomen:  Soft, nontender, BS present   Extremities:  trace peripheral edema.  Neurologic: Nonfocal, moving all four extremities  Skin: No lesions       Basic Metabolic Panel:  Recent Labs Lab 12/27/14 1436 12/28/14 0437 12/28/14 1416 12/29/14 0413  NA 137 139 141 145  K 5.3* 4.7 4.5 4.2  CL 103 110 114* 117*  CO2 23 20* 20* 20*  GLUCOSE 102* 129* 98 91  BUN 84* 83* 73* 55*  CREATININE 5.13* 5.51* 4.21* 3.01*  CALCIUM 9.3 8.4* 8.2* 8.7*    Liver Function Tests:  Recent Labs Lab 12/27/14 1436  AST 22  ALT 14  ALKPHOS 82  BILITOT 1.3*  PROT 8.1  ALBUMIN 4.0   No results for input(s): LIPASE, AMYLASE in the last 168 hours. No results for input(s): AMMONIA in the last 168 hours.  CBC:  Recent Labs Lab 12/27/14 1436 12/28/14 0437 12/29/14 0413  WBC 12.2* 11.7* 9.4  NEUTROABS 9.5*  --   --   HGB 11.0* 10.3* 10.7*  HCT 33.8* 31.5* 32.2*  MCV 92.6 92.7 93.8  PLT 271 236 232    Cardiac Enzymes:  Recent Labs Lab 12/27/14 1436  TROPONINI <0.03     BNP: Invalid input(s): POCBNP  CBG: No results for input(s): GLUCAP in the last 168 hours.  Microbiology: Results for orders placed or performed during the hospital encounter of 12/27/14  Culture, blood (routine x 2)     Status: None (Preliminary result)   Collection Time: 12/27/14  2:19 PM  Result Value Ref Range Status   Specimen Description BLOOD LEFT ASSIST CONTROL  Final   Special Requests BOTTLES DRAWN AEROBIC AND ANAEROBIC 4CC  Final   Culture NO GROWTH 2 DAYS  Final   Report Status PENDING  Incomplete  C difficile quick scan w PCR reflex     Status: None   Collection Time: 12/27/14  4:42 PM  Result Value Ref Range Status   C Diff antigen NEGATIVE NEGATIVE Final   C Diff toxin NEGATIVE NEGATIVE Final   C Diff interpretation Negative for C. difficile  Final  Stool culture     Status: None   Collection Time: 12/27/14  4:42 PM  Result Value Ref Range Status   Specimen Description STOOL  Final   Special Requests Normal  Final   Culture   Final    NO SALMONELLA OR SHIGELLA ISOLATED NO CAMPYLOBACTER DETECTED No Pathogenic E. coli detected    Report Status 12/29/2014 FINAL  Final  Culture, blood (routine x 2)  Status: None (Preliminary result)   Collection Time: 12/27/14  7:03 PM  Result Value Ref Range Status   Specimen Description BLOOD RIGHT HAND  Final   Special Requests BOTTLES DRAWN AEROBIC AND ANAEROBIC 4CC  Final   Culture NO GROWTH 2 DAYS  Final   Report Status PENDING  Incomplete  Urine culture     Status: None   Collection Time: 12/27/14 11:40 PM  Result Value Ref Range Status   Specimen Description URINE, CATHETERIZED  Final   Special Requests Immunocompromised  Final   Culture MULTIPLE SPECIES PRESENT, SUGGEST RECOLLECTION  Final   Report Status 12/29/2014 FINAL  Final  MRSA PCR Screening     Status: Abnormal   Collection Time: 12/28/14  5:30 AM  Result Value Ref Range Status   MRSA by PCR POSITIVE (A) NEGATIVE Final    Comment:        The  GeneXpert MRSA Assay (FDA approved for NASAL specimens only), is one component of a comprehensive MRSA colonization surveillance program. It is not intended to diagnose MRSA infection nor to guide or monitor treatment for MRSA infections. CRITICAL RESULT CALLED TO, READ BACK BY AND VERIFIED WITH: TAMMY TODD,RN 12/28/2014 0856 BY JRS   C difficile quick scan w PCR reflex     Status: None   Collection Time: 12/28/14  6:35 AM  Result Value Ref Range Status   C Diff antigen NEGATIVE NEGATIVE Final   C Diff toxin NEGATIVE NEGATIVE Final   C Diff interpretation Negative for C. difficile  Final    Coagulation Studies: No results for input(s): LABPROT, INR in the last 72 hours.  Urinalysis:  Recent Labs  12/27/14 1642  COLORURINE YELLOW*  LABSPEC 1.014  PHURINE 5.0  GLUCOSEU NEGATIVE  HGBUR NEGATIVE  BILIRUBINUR NEGATIVE  KETONESUR NEGATIVE  PROTEINUR NEGATIVE  NITRITE NEGATIVE  LEUKOCYTESUR TRACE*      Imaging: Ct Head Wo Contrast  12/27/2014   CLINICAL DATA:  Generalized weakness, 1 week duration.  EXAM: CT HEAD WITHOUT CONTRAST  TECHNIQUE: Contiguous axial images were obtained from the base of the skull through the vertex without intravenous contrast.  COMPARISON:  12/16/2014 CT.  12/18/2014 MRI.  FINDINGS: No focal abnormality is seen affecting the brainstem. There are a few old small vessel cerebellar infarctions. There is old infarction in the right temporal lobe consistent with right MCA branch vessel territory infarction. There is an old right posterior frontal cortical and subcortical infarction. There are extensive chronic small vessel ischemic changes throughout the deep white matter. Late subacute infarction in the left basal ganglia was acute on the previous study. No progressive change. No evidence of swelling or hemorrhage. Elsewhere, no evidence of hemorrhage, mass lesion, hydrocephalus or extra-axial collection. No calvarial abnormality. Sinuses, middle ears and  mastoids are clear. There is atherosclerotic calcification of the major vessels at the base of the brain.  IMPRESSION: No acute insult. Old right temporal lobe and right posterior frontal lobe cortical and subcortical infarctions. Extensive chronic small vessel ischemic changes throughout the brain. Expected evolutionary changes of a late subacute left basal ganglia stroke. No sign of hemorrhage or mass effect. No new stroke.   Electronically Signed   By: Nelson Chimes M.D.   On: 12/27/2014 16:08   US Renal  12/27/2014   CLINICAL DATA:  Acute onset of renal insufficiency. Initial encounter.  EXAM: RENAL / URINARY TRACT ULTRASOUND COMPLETE  COMPARISON:  CT of the abdomen and pelvis performed 11/30/2012  FINDINGS: Right Kidney:  Length: 10.3  cm. Echogenicity within normal limits. No mass or hydronephrosis visualized.  Left Kidney:  Length: 9.4 cm. Echogenicity within normal limits. No mass or hydronephrosis visualized.  Bladder:  Appears normal for degree of bladder distention.  IMPRESSION: Unremarkable renal ultrasound.   Electronically Signed   By: Garald Balding M.D.   On: 12/27/2014 19:08     Medications:     . amLODipine  10 mg Oral Daily  . [START ON 12/30/2014] aspirin  81 mg Oral BH-q7a  . atorvastatin  20 mg Oral q1800  . ciprofloxacin  250 mg Oral BID  . clopidogrel  75 mg Oral Daily  . furosemide  20 mg Oral Daily  . heparin  5,000 Units Subcutaneous 3 times per day  . memantine  28 mg Oral Daily  . metoprolol succinate  25 mg Oral Daily  . metroNIDAZOLE  500 mg Oral 3 times per day  . pantoprazole  40 mg Oral BID  . QUEtiapine  25 mg Oral QHS  . rOPINIRole  2 mg Oral TID  . sodium chloride  3 mL Intravenous Q12H   acetaminophen **OR** acetaminophen, ondansetron **OR** ondansetron (ZOFRAN) IV  Assessment/ Plan:  81 y.o. female with hypertension, osteoarthritis, hyperlipidemia, fibromyalgia, dementia who was admitted to [facility] on 12/27/2014 for acute renal failure  1. Acute renal  failure on chronic kidney disease stage IV with hyperkalemia: seems to be secondary to prerenal azotemia. Poor po intake and diarrhea. Urine output is nonoliguric. Baseline creatinine of 1.61, eGFR of 27 on 9/28.  -Will start on gentle hydration with 0.9 NS at 50cc/hr for now.  Renal US unremarkable.  UA negative for proteinuria.   Hold lasix.   2. Hypertension: BP 152/58, continue amlodipine and metoprolol for now.  3.  Will follow progress closely.   LOS: 2 Amanda Hogan 10/10/20164:00 PM

## 2014-12-30 ENCOUNTER — Inpatient Hospital Stay: Payer: PPO

## 2014-12-30 LAB — BASIC METABOLIC PANEL
ANION GAP: 7 (ref 5–15)
BUN: 29 mg/dL — ABNORMAL HIGH (ref 6–20)
CALCIUM: 9 mg/dL (ref 8.9–10.3)
CHLORIDE: 115 mmol/L — AB (ref 101–111)
CO2: 24 mmol/L (ref 22–32)
Creatinine, Ser: 1.61 mg/dL — ABNORMAL HIGH (ref 0.44–1.00)
GFR calc non Af Amer: 28 mL/min — ABNORMAL LOW (ref >60)
GFR, EST AFRICAN AMERICAN: 33 mL/min — AB (ref >60)
Glucose, Bld: 110 mg/dL — ABNORMAL HIGH (ref 65–99)
POTASSIUM: 3.6 mmol/L (ref 3.5–5.1)
Sodium: 146 mmol/L — ABNORMAL HIGH (ref 135–145)

## 2014-12-30 LAB — CBC
HEMATOCRIT: 30.2 % — AB (ref 35.0–47.0)
HEMOGLOBIN: 10.2 g/dL — AB (ref 12.0–16.0)
MCH: 30.8 pg (ref 26.0–34.0)
MCHC: 33.7 g/dL (ref 32.0–36.0)
MCV: 91.6 fL (ref 80.0–100.0)
Platelets: 254 10*3/uL (ref 150–440)
RBC: 3.3 MIL/uL — AB (ref 3.80–5.20)
RDW: 14.3 % (ref 11.5–14.5)
WBC: 9.9 10*3/uL (ref 3.6–11.0)

## 2014-12-30 MED ORDER — MUPIROCIN 2 % EX OINT
1.0000 "application " | TOPICAL_OINTMENT | Freq: Two times a day (BID) | CUTANEOUS | Status: DC
  Administered 2014-12-30 – 2014-12-31 (×2): 1 via NASAL
  Filled 2014-12-30: qty 22

## 2014-12-30 MED ORDER — CHLORHEXIDINE GLUCONATE CLOTH 2 % EX PADS: 6.0000 | MEDICATED_PAD | Freq: Every day | CUTANEOUS | Status: DC

## 2014-12-30 NOTE — Progress Notes (Signed)
[facility]  ROUNDING NOTE   Subjective:  Pt seen at bedside.  Cr down to 1.61 this AM. Good UOP noted.  Objective:  Vital signs in last 24 hours:  Temp:  [98.5 F (36.9 C)-98.6 F (37 C)] 98.6 F (37 C) (10/11 0534) Pulse Rate:  [73-87] 73 (10/11 0534) Resp:  [18-20] 20 (10/11 0534) BP: (145-152)/(58-60) 145/60 mmHg (10/11 0534) SpO2:  [93 %-98 %] 95 % (10/11 0534)  Weight change:  Filed Weights   12/27/14 1410  Weight: 85.73 kg (189 lb)    Intake/Output: I/O last 3 completed shifts: In: 2456.7 [I.V.:1606.7; Other:250; IV [id number]:384] Out: 6659 [Urine:4625]   Intake/Output this shift:     Physical Exam: General: NAD, elderly.  Head: Normocephalic, atraumatic. Moist oral mucosal membranes  Eyes: Anicteric  Neck: Supple, trachea midline  Lungs:  CTAB normal effort  Heart: S1S2 no rubs  Abdomen:  Soft, nontender, BS present   Extremities:  trace peripheral edema.  Neurologic: Nonfocal, moving all four extremities  Skin: No lesions       Basic Metabolic Panel:  Recent Labs Lab 12/27/14 1436 12/28/14 0437 12/28/14 1416 12/29/14 0413 12/30/14 0450  NA 137 139 141 145 146*  K 5.3* 4.7 4.5 4.2 3.6  CL 103 110 114* 117* 115*  CO2 23 20* 20* 20* 24  GLUCOSE 102* 129* 98 91 110*  BUN 84* 83* 73* 55* 29*  CREATININE 5.13* 5.51* 4.21* 3.01* 1.61*  CALCIUM 9.3 8.4* 8.2* 8.7* 9.0    Liver Function Tests:  Recent Labs Lab 12/27/14 1436  AST 22  ALT 14  ALKPHOS 82  BILITOT 1.3*  PROT 8.1  ALBUMIN 4.0   No results for input(s): LIPASE, AMYLASE in the last 168 hours. No results for input(s): AMMONIA in the last 168 hours.  CBC:  Recent Labs Lab 12/27/14 1436 12/28/14 0437 12/29/14 0413 12/30/14 0450  WBC 12.2* 11.7* 9.4 9.9  NEUTROABS 9.5*  --   --   --   HGB 11.0* 10.3* 10.7* 10.2*  HCT 33.8* 31.5* 32.2* 30.2*  MCV 92.6 92.7 93.8 91.6  PLT 271 236 232 254    Cardiac Enzymes:  Recent Labs Lab 12/27/14 1436  TROPONINI  <0.03    BNP: Invalid input(s): POCBNP  CBG: No results for input(s): GLUCAP in the last 168 hours.  Microbiology: Results for orders placed or performed during the hospital encounter of 12/27/14  Culture, blood (routine x 2)     Status: None (Preliminary result)   Collection Time: 12/27/14  2:19 PM  Result Value Ref Range Status   Specimen Description BLOOD LEFT ASSIST CONTROL  Final   Special Requests BOTTLES DRAWN AEROBIC AND ANAEROBIC 4CC  Final   Culture NO GROWTH 2 DAYS  Final   Report Status PENDING  Incomplete  C difficile quick scan w PCR reflex     Status: None   Collection Time: 12/27/14  4:42 PM  Result Value Ref Range Status   C Diff antigen NEGATIVE NEGATIVE Final   C Diff toxin NEGATIVE NEGATIVE Final   C Diff interpretation Negative for C. difficile  Final  Stool culture     Status: None   Collection Time: 12/27/14  4:42 PM  Result Value Ref Range Status   Specimen Description STOOL  Final   Special Requests Normal  Final   Culture   Final    NO SALMONELLA OR SHIGELLA ISOLATED NO CAMPYLOBACTER DETECTED No Pathogenic E. coli detected    Report Status 12/29/2014 FINAL  Final  Culture, blood (routine x 2)     Status: None (Preliminary result)   Collection Time: 12/27/14  7:03 PM  Result Value Ref Range Status   Specimen Description BLOOD RIGHT HAND  Final   Special Requests BOTTLES DRAWN AEROBIC AND ANAEROBIC 4CC  Final   Culture NO GROWTH 2 DAYS  Final   Report Status PENDING  Incomplete  Urine culture     Status: None   Collection Time: 12/27/14 11:40 PM  Result Value Ref Range Status   Specimen Description URINE, CATHETERIZED  Final   Special Requests Immunocompromised  Final   Culture MULTIPLE SPECIES PRESENT, SUGGEST RECOLLECTION  Final   Report Status 12/29/2014 FINAL  Final  MRSA PCR Screening     Status: Abnormal   Collection Time: 12/28/14  5:30 AM  Result Value Ref Range Status   MRSA by PCR POSITIVE (A) NEGATIVE Final    Comment:         The GeneXpert MRSA Assay (FDA approved for NASAL specimens only), is one component of a comprehensive MRSA colonization surveillance program. It is not intended to diagnose MRSA infection nor to guide or monitor treatment for MRSA infections. CRITICAL RESULT CALLED TO, READ BACK BY AND VERIFIED WITH: TAMMY TODD,RN 12/28/2014 0856 BY JRS   C difficile quick scan w PCR reflex     Status: None   Collection Time: 12/28/14  6:35 AM  Result Value Ref Range Status   C Diff antigen NEGATIVE NEGATIVE Final   C Diff toxin NEGATIVE NEGATIVE Final   C Diff interpretation Negative for C. difficile  Final  Stool culture     Status: None (Preliminary result)   Collection Time: 12/29/14 11:00 AM  Result Value Ref Range Status   Specimen Description STOOL  Final   Special Requests Normal  Final   Culture   Final    HEAVY GROWTH UNIDENTIFIED ORGANISM IDENTIFICATION AND SUSCEPTIBILITIES TO FOLLOW NO CAMPYLOBACTER DETECTED NORMAL FLORA ABSENT    Report Status PENDING  Incomplete    Coagulation Studies: No results for input(s): LABPROT, INR in the last 72 hours.  Urinalysis:  Recent Labs  12/27/14 1642  COLORURINE YELLOW*  LABSPEC 1.014  PHURINE 5.0  GLUCOSEU NEGATIVE  HGBUR NEGATIVE  BILIRUBINUR NEGATIVE  KETONESUR NEGATIVE  PROTEINUR NEGATIVE  NITRITE NEGATIVE  LEUKOCYTESUR TRACE*      Imaging: No results found.   Medications:   . sodium chloride 50 mL/hr at 12/30/14 0535   . amLODipine  10 mg Oral Daily  . aspirin  81 mg Oral BH-q7a  . atorvastatin  20 mg Oral q1800  . ciprofloxacin  250 mg Oral BID  . clopidogrel  75 mg Oral Daily  . heparin  5,000 Units Subcutaneous 3 times per day  . memantine  28 mg Oral Daily  . metoprolol succinate  25 mg Oral Daily  . metroNIDAZOLE  500 mg Oral 3 times per day  . pantoprazole  40 mg Oral BID  . QUEtiapine  25 mg Oral QHS  . rOPINIRole  2 mg Oral TID  . sodium chloride  3 mL Intravenous Q12H   acetaminophen **OR**  acetaminophen, ondansetron **OR** ondansetron (ZOFRAN) IV  Assessment/ Plan:  81 y.o. female with hypertension, osteoarthritis, hyperlipidemia, fibromyalgia, dementia who was admitted to [facility] on 12/27/2014 for acute renal failure  1. Acute renal failure on chronic kidney disease stage IV with hyperkalemia: seems to be secondary to prerenal azotemia. Poor po intake and diarrhea. Urine output is nonoliguric. Baseline creatinine  of 1.61, eGFR of 27 on 9/28.  -Seems to be at baseline Cr now, would continue IVF hydration through today and stop tomorrow.  Follow Cr trend. Hyperkalemia resolved.   2. Hypertension: BP 145/60 this AM, continue amlodipine and metoprolol for now.  3.  Anemia of CKD: hgb 10.3, no indication for procrit at present.   LOS: 3 Stephanie Littman 10/11/201611:47 AM

## 2014-12-30 NOTE — Evaluation (Signed)
Physical Therapy Evaluation Patient Details Name: Amanda Hogan MRN: [id number] DOB: 1928/08/11 Today's Date: 12/30/2014   History of Present Illness  Patient is an 81 y/o female with a history of dementia and essential HTN presenting with acute renal failure. Patient found to have CVA manifesting in R sided weakness roughly 1 week prior to this encounter. She presents from [facility].  Clinical Impression  Patient is oriented to self only and cannot provide any history throughout this session. Her participation is somewhat limited as she states several times she is unable to complete a task (supine to sit, sit to stand, etc) and begins attempt then stops. Patient requires physical assistance to complete transfers and cuing for ambulation. No loss of balance noted, however minimal step lengths are noted throughout ambulation to chair. She demonstrates generalized weakness, it is unclear how much of this is due to deconditioning and how much is a result of poor participation. Patient would benefit from skilled PT services to address her mobility deficits.     Follow Up Recommendations SNF    Equipment Recommendations       Recommendations for Other Services       Precautions / Restrictions Precautions Precautions: Fall Precaution Comments: Contact precautions  Restrictions Weight Bearing Restrictions: No      Mobility  Bed Mobility Overal bed mobility: Needs Assistance Bed Mobility: Supine to Sit     Supine to sit: Min assist     General bed mobility comments: Patient appears physically capable to transfer to EOB, however she presents with poor motor planning and lethargy limiting her speed/trunk control.   Transfers Overall transfer level: Needs assistance Equipment used: Rolling walker (2 wheeled) Transfers: Sit to/from Stand Sit to Stand: Min assist;Mod assist         General transfer comment: Patient requires significant encouragement and physical  assistance to transfer sit to stand, she initially refuses then once in standing on first attempt states she cannot do any more and sits down. On second attempt agreed to transfer to chair.   Ambulation/Gait Ambulation/Gait assistance: Min assist Ambulation Distance (Feet): 3 Feet Assistive device: Rolling walker (2 wheeled) Gait Pattern/deviations: Shuffle;Decreased step length - left;Decreased step length - right   Gait velocity interpretation: Below normal speed for age/gender General Gait Details: Patient agreed to transfer to chair, during limited gait evaluation patient displays very short shuffling steps. Steps appeared to be symmetrical, she was able to back into the chair safely.   Stairs            Wheelchair Mobility    Modified Rankin (Stroke Patients Only)       Balance Overall balance assessment: Needs assistance Sitting-balance support: Feet supported Sitting balance-Leahy Scale: Fair Sitting balance - Comments: No balance deficits noted, deferred any kind of testing as patient appeared withdrawn.    Standing balance support: Bilateral upper extremity supported Standing balance-Leahy Scale: Fair Standing balance comment: Patient demonstrated no loss of balance in ambulation or standing with RW, she did sit of her own accord on first attempt with standing.                              Pertinent Vitals/Pain Pain Assessment: Faces Faces Pain Scale: Hurts little more Pain Intervention(s): Limited activity within patient's tolerance;Monitored during session;Repositioned    Home Living Family/patient expects to be discharged to:: Skilled nursing facility  Home Equipment: Gilford Rile - 2 wheels Additional Comments: Patient unable to provide information today. Patient has been rolling on rollator most recently per PT note from last week secondary to LLE pain. In a note on 8/12 patient noted to have ambulated 80', 15' last week with RW.     Prior Function Level of Independence: Independent with assistive device(s)         Comments: Per previous note she has been able to independently use 4WW.      Hand Dominance   Dominant Hand: Left    Extremity/Trunk Assessment   Upper Extremity Assessment:  (Able to perform anti-gravity movements to touch head bilaterally )           Lower Extremity Assessment:  (Able to hold at least a 4/5 Knee extension resistance, no buckling in gait)         Communication   Communication: Expressive difficulties  Cognition Arousal/Alertness: Awake/alert Behavior During Therapy: WFL for tasks assessed/performed;Flat affect (Withdrawn) Overall Cognitive Status: Impaired/Different from baseline (Patient has a history of cognitive deficits at baseline, appears to be declined somewhat from her baseline. Patient did not provide more than 2-3 word answers so difficult to assess.)                      General Comments      Exercises        Assessment/Plan    PT Assessment Patient needs continued PT services  PT Diagnosis Difficulty walking;Generalized weakness;Altered mental status;Abnormality of gait   PT Problem List Decreased strength;Decreased activity tolerance;Decreased mobility;Decreased cognition  PT Treatment Interventions DME instruction;Gait training;Functional mobility training;Therapeutic activities;Therapeutic exercise;Balance training;Patient/family education   PT Goals (Current goals can be found in the Care Plan section) Acute Rehab PT Goals PT Goal Formulation: Patient unable to participate in goal setting Time For Goal Achievement: 01/13/15 Potential to Achieve Goals: Fair    Frequency Min 2X/week   Barriers to discharge        Co-evaluation               End of Session Equipment Utilized During Treatment: Gait belt Activity Tolerance: Patient tolerated treatment well Patient left: in chair;with chair alarm set;with call bell/phone within  reach Nurse Communication: Mobility status         Time: 7681-1572 PT Time Calculation (min) (ACUTE ONLY): 16 min   Charges:   PT Evaluation $Initial PT Evaluation Tier I: 1 Procedure     PT G Codes:       Kerman Passey, PT, DPT    12/30/2014, 10:06 AM

## 2014-12-30 NOTE — Progress Notes (Signed)
CSW was notified by RN that Pt has been medically cleared for dc back to SNF. Family was hoping to transition Pt to ALF from hospital, but Pt is still too acute for ALF level of care. CSW discussed with Pt's daughter and family is in agreement with Pt's return to [facility]. CSW confirmed SNF bed ready at [facility].   Toma Copier, [place]

## 2014-12-30 NOTE — Progress Notes (Signed)
[facility] [place] NAME: Natallie Ravenscroft    MR#:  [id number]  DATE OF BIRTH:  1929-02-09  SUBJECTIVE:  CHIEF COMPLAINT:   Chief Complaint  Patient presents with  . Weakness   Alert, confused, nauseated. Attempted to eat but vomiting.  REVIEW OF SYSTEMS:   Review of Systems  Constitutional: Negative for fever.  Respiratory: Negative for shortness of breath.   Cardiovascular: Negative for chest pain and palpitations.  Gastrointestinal: Negative for nausea, vomiting and abdominal pain.  Genitourinary: Negative for dysuria.    DRUG ALLERGIES:   Allergies  Allergen Reactions  . Iohexol Shortness Of Breath and Itching  . Codeine Sulfate Other (See Comments)    Unknown reaction.  . Contrast Media [Iodinated Diagnostic Agents] Other (See Comments)    Reaction:  Unknown     VITALS:  Blood pressure 158/94, pulse 72, temperature 98 F (36.7 C), temperature source Oral, resp. rate 16, height 5\' 5"  (1.651 m), weight 85.73 kg (189 lb), SpO2 87 %.  PHYSICAL EXAMINATION:  GENERAL:  81 y.o.-year-old patient sitting up, uncomfortable. EYES: Pupils equal, round, reactive to light and accommodation. No scleral icterus. Extraocular muscles intact.  HEENT: Head atraumatic, normocephalic. Oropharynx and nasopharynx clear.  NECK:  Supple, no jugular venous distention. No thyroid enlargement, no tenderness.  LUNGS: Normal breath sounds bilaterally, no wheezing, rales,rhonchi or crepitation. No use of accessory muscles of respiration.  CARDIOVASCULAR: S1, S2 normal. No murmurs, rubs, or gallops.  ABDOMEN: Soft, nontender, distended. Bowel sounds present. No organomegaly or mass.  EXTREMITIES: No pedal edema, cyanosis, or clubbing.  NEUROLOGIC: Cranial nerves II through XII are intact. Muscle strength 5/5 in all extremities. Sensation intact. Gait not checked.  PSYCHIATRIC: The patient is alert and oriented to self only  SKIN: No obvious rash,  lesion, or ulcer.    LABORATORY PANEL:   CBC  Recent Labs Lab 12/30/14 0450  WBC 9.9  HGB 10.2*  HCT 30.2*  PLT 254   ------------------------------------------------------------------------------------------------------------------  Chemistries   Recent Labs Lab 12/27/14 1436  12/30/14 0450  NA 137  < > 146*  K 5.3*  < > 3.6  CL 103  < > 115*  CO2 23  < > 24  GLUCOSE 102*  < > 110*  BUN 84*  < > 29*  CREATININE 5.13*  < > 1.61*  CALCIUM 9.3  < > 9.0  AST 22  --   --   ALT 14  --   --   ALKPHOS 82  --   --   BILITOT 1.3*  --   --   < > = values in this interval not displayed. ------------------------------------------------------------------------------------------------------------------  Cardiac Enzymes  Recent Labs Lab 12/27/14 1436  TROPONINI <0.03   ------------------------------------------------------------------------------------------------------------------  RADIOLOGY:  No results found.  EKG:   Orders placed or performed during the hospital encounter of 12/27/14  . EKG 12-Lead  . EKG 12-Lead  . ED EKG  . ED EKG    ASSESSMENT AND PLAN:   #1 Acute kidney failure on chronic kidney disease: resolved - likely due to ATN secondary to diarrhea, decreased by mouth intake and initial hypotension - Nephrology following - improving with fluids  #2 altered mental status - improving, back to baseline - appreciate neurology consultation, no further seizure activity noted  #3 hyperkalemia: resolved - Due to renal failure, hyperkalemia is improving, continue fluids  #4 diarrhea - C differential negative, stool culture with heavy growth unidentified pathogen, continue cipro +  flagyl - no further diarrhea, but vomiting today, will get abd film  PT evaluation needed, she has refused so far  CODE STATUS: Full   TOTAL TIME TAKING CARE OF THIS PATIENT: 25 minutes.  Greater than 50% of time spent in care coordination and counseling. POSSIBLE D/C  IN 2-3 DAYS, DEPENDING ON CLINICAL CONDITION.   Myrtis Ser M.D on 12/30/2014 at 3:28 PM  Between 7am to 6pm - Pager - [phone]  After 6pm go to [email or web address] - password [email or web address] [facility]  [facility]  Office  [phone]  CC: Primary care physician; Delia Chimes, NP

## 2014-12-31 ENCOUNTER — Encounter: Payer: Self-pay | Admitting: *Deleted

## 2014-12-31 LAB — BASIC METABOLIC PANEL
ANION GAP: 7 (ref 5–15)
BUN: 17 mg/dL (ref 6–20)
CALCIUM: 8.9 mg/dL (ref 8.9–10.3)
CO2: 26 mmol/L (ref 22–32)
Chloride: 112 mmol/L — ABNORMAL HIGH (ref 101–111)
Creatinine, Ser: 1.3 mg/dL — ABNORMAL HIGH (ref 0.44–1.00)
GFR, EST AFRICAN AMERICAN: 43 mL/min — AB (ref >60)
GFR, EST NON AFRICAN AMERICAN: 37 mL/min — AB (ref >60)
GLUCOSE: 110 mg/dL — AB (ref 65–99)
POTASSIUM: 3.2 mmol/L — AB (ref 3.5–5.1)
Sodium: 145 mmol/L (ref 135–145)

## 2014-12-31 LAB — CBC
HEMATOCRIT: 31.7 % — AB (ref 35.0–47.0)
HEMOGLOBIN: 10.8 g/dL — AB (ref 12.0–16.0)
MCH: 31.2 pg (ref 26.0–34.0)
MCHC: 34.2 g/dL (ref 32.0–36.0)
MCV: 91.2 fL (ref 80.0–100.0)
Platelets: 240 10*3/uL (ref 150–440)
RBC: 3.48 MIL/uL — AB (ref 3.80–5.20)
RDW: 14.4 % (ref 11.5–14.5)
WBC: 8 10*3/uL (ref 3.6–11.0)

## 2014-12-31 LAB — STOOL CULTURE: Special Requests: NORMAL

## 2014-12-31 MED ORDER — CLINDAMYCIN HCL 150 MG PO CAPS
300.0000 mg | ORAL_CAPSULE | Freq: Three times a day (TID) | ORAL | Status: DC
  Administered 2014-12-31 (×2): 300 mg via ORAL
  Filled 2014-12-31 (×3): qty 2

## 2014-12-31 MED ORDER — POTASSIUM CHLORIDE CRYS ER 20 MEQ PO TBCR
40.0000 meq | EXTENDED_RELEASE_TABLET | Freq: Once | ORAL | Status: AC
  Administered 2014-12-31: 40 meq via ORAL
  Filled 2014-12-31: qty 2

## 2014-12-31 MED ORDER — DOXYCYCLINE HYCLATE 100 MG PO TABS: 100.0000 mg | ORAL_TABLET | Freq: Two times a day (BID) | ORAL | Status: DC

## 2014-12-31 MED ORDER — ENSURE ENLIVE PO LIQD
237.0000 mL | Freq: Three times a day (TID) | ORAL | Status: DC
  Administered 2014-12-31 – 2015-01-01 (×3): 237 mL via ORAL

## 2014-12-31 NOTE — Progress Notes (Signed)
Initial Nutrition Assessment   INTERVENTION:   Meals and Snacks: Cater to patient preferences. Will recommend dysphagia III, if pt with difficulty chewing Coordination of Care: if pt at risk for aspiration, recommend SLP evaulation. Medical Food Supplement Therapy: will recommend Ensure Enlive po TID, each supplement provides 350 kcal and 20 grams of protein   NUTRITION DIAGNOSIS:   Inadequate oral intake related to acute illness as evidenced by meal completion < 50%.  GOAL:   Patient will meet greater than or equal to 90% of their needs  MONITOR:    (Energy Intake, Electrolyte and Renal Profile, Anthropometrics, Digestive System)  REASON FOR ASSESSMENT:   Diagnosis    ASSESSMENT:   Pt admitted with weakness and acute kidney injury. Per MD note, pt with emesis yesterday. Pt on isolation for MRSA. Pt confused at times on visit. RD notes pt with h/o stroke 2 weeks ago.  Past Medical History  Diagnosis Date  . Hypertension   . Arthritis   . High cholesterol   . Acid reflux   . TIA (transient ischemic attack)   . Restless leg syndrome   . Complication of anesthesia     "w/my back; gave me too much; thought I'd had stroke but didn't"  . Heart murmur   . Stroke [facility])     "said it looked like bullet holes; had a bunch"  . Angina   . Exertional dyspnea   . Sinus malignant neoplasm ([place])   . Fibromyalgia   . Memory disorder, possibly not taking home meds at times 07/22/2011  . Anxiety 02/23/2012  . Dementia 02/23/2012  . Kidney stone   . HTN (hypertension)   . Mild carotid artery disease ([place]) 08/05/11    carotid dopplers, mild disease  . Chest pain     negative lexiscan myoview 53/13, last echo 02/24/12-EF 83-419, mid systolic obliteration of the LV, cavity aortic sclerosis    Diet Order:  Diet Heart Room service appropriate?: Yes; Fluid consistency:: Thin    Current Nutrition: Pt ate bites of lunch today, roast beef sandwich, soup, fruit, and potatoes. Per RN  [place] pt ate some chocolate that daughter brought in.  Food/Nutrition-Related History: Unable to clarify PTA. Since admission pt ate 45% of lunch and 0% of breakfast yesterday.    Medications: NS at 45mL/hr, Protonix  Electrolyte/Renal Profile and Glucose Profile:   Recent Labs Lab 12/29/14 0413 12/30/14 0450 12/31/14 0455  NA 145 146* 145  K 4.2 3.6 3.2*  CL 117* 115* 112*  CO2 20* 24 26  BUN 55* 29* 17  CREATININE 3.01* 1.61* 1.30*  CALCIUM 8.7* 9.0 8.9  GLUCOSE 91 110* 110*   Protein Profile:  Recent Labs Lab 12/27/14 1436  ALBUMIN 4.0    Gastrointestinal Profile: per chart, emesis yesterday, noe today per RN [place]. Last BM:  12/29/2014   Nutrition-Focused Physical Exam Findings:  Unable to complete Nutrition-Focused physical exam at this time.    Weight Change: Per CH[facility] weight has been relatively stable.   Skin:  Reviewed, no issues  Height:   Ht Readings from Last 1 Encounters:  12/27/14 5\' 5"  (1.651 m)    Weight:   Wt Readings from Last 1 Encounters:  12/27/14 189 lb (85.73 kg)    Wt Readings from Last 10 Encounters:  12/27/14 189 lb (85.73 kg)  12/24/14 190 lb (86.183 kg)  12/16/14 190 lb 8 oz (86.41 kg)  12/18/14 190 lb (86.183 kg)  12/17/14 190 lb (86.183 kg)  11/20/14 176 lb (79.833  kg)  11/03/14 191 lb (86.637 kg)  11/01/14 194 lb 3.6 oz (88.1 kg)  02/23/12 190 lb 11.2 oz (86.5 kg)  07/21/11 192 lb 10.9 oz (87.4 kg)    BMI:  Body mass index is 31.45 kg/(m^2).  Estimated Nutritional Needs:   Kcal:  BEE: 1312kcals, TEE: (IF 1.1-1.3)(AF 1.2) 1733-2040kcals  Protein:  69-86g protein (0.8-1.0g/kg)  Fluid:  2143-2577mL of fluid (25-33mL/kg)  EDUCATION NEEDS:   Education needs no appropriate at this time   [place], RD, LDN Pager [phone]

## 2014-12-31 NOTE — Progress Notes (Signed)
Patient ID: Amanda Hogan, female   DOB: 1928/11/06, 81 y.o.   MRN: [id number] [facility] PROGRESS NOTE  PCP: Delia Chimes, NP  HPI/Subjective: Patient did not offer any complaints. Answered some yes or no questions but did not elaborate. This morning when I saw her she still had breakfast that was not eaten. She did have vomiting yesterday. Patient states that she is not hungry.  Objective: Filed Vitals:   12/31/14 0930  BP: 158/69  Pulse: 72  Temp: 97.6 F (36.4 C)  Resp: 17    Filed Weights   12/27/14 1410  Weight: 85.73 kg (189 lb)    ROS: Review of Systems  Constitutional: Negative for fever and chills.  Eyes: Negative for blurred vision.  Respiratory: Negative for cough and shortness of breath.   Cardiovascular: Negative for chest pain.  Gastrointestinal: Negative for nausea, vomiting, abdominal pain, diarrhea and constipation.  Genitourinary: Negative for dysuria.  Musculoskeletal: Negative for joint pain.  Neurological: Negative for dizziness and headaches.   Exam: Physical Exam  HENT:  Nose: No mucosal edema.  Mouth/Throat: No oropharyngeal exudate or posterior oropharyngeal edema.  Eyes: Conjunctivae, EOM and lids are normal. Pupils are equal, round, and reactive to light.  Neck: No JVD present. Carotid bruit is not present. No edema present. No thyroid mass and no thyromegaly present.  Cardiovascular: S1 normal and S2 normal.  Exam reveals no gallop.   Murmur heard.  Systolic murmur is present with a grade of 3/6  Pulses:      Dorsalis pedis pulses are 2+ on the right side, and 2+ on the left side.  Respiratory: No respiratory distress. She has no wheezes. She has no rhonchi. She has no rales.  GI: Soft. Bowel sounds are normal. There is no tenderness.  Musculoskeletal:       Right ankle: She exhibits swelling.       Left ankle: She exhibits swelling.  Lymphadenopathy:    She has no cervical adenopathy.  Neurological: She is alert. No  cranial nerve deficit.  Skin: Skin is warm. No rash noted. Nails show no clubbing.  Psychiatric: She has a normal mood and affect.    Data Reviewed: Basic Metabolic Panel:  Recent Labs Lab 12/28/14 0437 12/28/14 1416 12/29/14 0413 12/30/14 0450 12/31/14 0455  NA 139 141 145 146* 145  K 4.7 4.5 4.2 3.6 3.2*  CL 110 114* 117* 115* 112*  CO2 20* 20* 20* 24 26  GLUCOSE 129* 98 91 110* 110*  BUN 83* 73* 55* 29* 17  CREATININE 5.51* 4.21* 3.01* 1.61* 1.30*  CALCIUM 8.4* 8.2* 8.7* 9.0 8.9   Liver Function Tests:  Recent Labs Lab 12/27/14 1436  AST 22  ALT 14  ALKPHOS 82  BILITOT 1.3*  PROT 8.1  ALBUMIN 4.0   CBC:  Recent Labs Lab 12/27/14 1436 12/28/14 0437 12/29/14 0413 12/30/14 0450 12/31/14 0455  WBC 12.2* 11.7* 9.4 9.9 8.0  NEUTROABS 9.5*  --   --   --   --   HGB 11.0* 10.3* 10.7* 10.2* 10.8*  HCT 33.8* 31.5* 32.2* 30.2* 31.7*  MCV 92.6 92.7 93.8 91.6 91.2  PLT 271 236 232 254 240   Cardiac Enzymes:  Recent Labs Lab 12/27/14 1436  TROPONINI <0.03   BNP (last 3 results)  Recent Labs  10/28/14 0440 10/30/14 0340  BNP 715.8* 603.2*     Recent Results (from the past 240 hour(s))  Culture, blood (routine x 2)     Status: None (Preliminary result)  Collection Time: 12/27/14  2:19 PM  Result Value Ref Range Status   Specimen Description BLOOD LEFT ASSIST CONTROL  Final   Special Requests BOTTLES DRAWN AEROBIC AND ANAEROBIC 4CC  Final   Culture NO GROWTH 4 DAYS  Final   Report Status PENDING  Incomplete  C difficile quick scan w PCR reflex     Status: None   Collection Time: 12/27/14  4:42 PM  Result Value Ref Range Status   C Diff antigen NEGATIVE NEGATIVE Final   C Diff toxin NEGATIVE NEGATIVE Final   C Diff interpretation Negative for C. difficile  Final  Stool culture     Status: None   Collection Time: 12/27/14  4:42 PM  Result Value Ref Range Status   Specimen Description STOOL  Final   Special Requests Normal  Final   Culture    Final    NO SALMONELLA OR SHIGELLA ISOLATED NO CAMPYLOBACTER DETECTED No Pathogenic E. coli detected    Report Status 12/29/2014 FINAL  Final  Culture, blood (routine x 2)     Status: None (Preliminary result)   Collection Time: 12/27/14  7:03 PM  Result Value Ref Range Status   Specimen Description BLOOD RIGHT HAND  Final   Special Requests BOTTLES DRAWN AEROBIC AND ANAEROBIC 4CC  Final   Culture NO GROWTH 4 DAYS  Final   Report Status PENDING  Incomplete  Urine culture     Status: None   Collection Time: 12/27/14 11:40 PM  Result Value Ref Range Status   Specimen Description URINE, CATHETERIZED  Final   Special Requests Immunocompromised  Final   Culture MULTIPLE SPECIES PRESENT, SUGGEST RECOLLECTION  Final   Report Status 12/29/2014 FINAL  Final  MRSA PCR Screening     Status: Abnormal   Collection Time: 12/28/14  5:30 AM  Result Value Ref Range Status   MRSA by PCR POSITIVE (A) NEGATIVE Final    Comment:        The GeneXpert MRSA Assay (FDA approved for NASAL specimens only), is one component of a comprehensive MRSA colonization surveillance program. It is not intended to diagnose MRSA infection nor to guide or monitor treatment for MRSA infections. CRITICAL RESULT CALLED TO, READ BACK BY AND VERIFIED WITH: TAMMY TODD,RN 12/28/2014 0856 BY JRS   C difficile quick scan w PCR reflex     Status: None   Collection Time: 12/28/14  6:35 AM  Result Value Ref Range Status   C Diff antigen NEGATIVE NEGATIVE Final   C Diff toxin NEGATIVE NEGATIVE Final   C Diff interpretation Negative for C. difficile  Final  Stool culture     Status: None   Collection Time: 12/29/14 11:00 AM  Result Value Ref Range Status   Specimen Description STOOL  Final   Special Requests Normal  Final   Culture   Final    HEAVY GROWTH METHICILLIN RESISTANT STAPHYLOCOCCUS AUREUS No Pathogenic E. coli detected NO CAMPYLOBACTER DETECTED NORMAL FLORA ABSENT CRITICAL RESULT CALLED TO, READ BACK BY AND  VERIFIED WITH: DR. Leslye Peer AT 0830 12/31/14 DLV    Report Status 12/31/2014 FINAL  Final   Organism ID, Bacteria METHICILLIN RESISTANT STAPHYLOCOCCUS AUREUS  Final      Susceptibility   Methicillin resistant staphylococcus aureus - MIC*    CIPROFLOXACIN >=8 RESISTANT Resistant     GENTAMICIN <=0.5 SENSITIVE Sensitive     OXACILLIN Value in next row Resistant      >=4 MODERATELY RESISTANT    VANCOMYCIN 1 SENSITIVE Sensitive  TRIMETH/SULFA >=320 RESISTANT Resistant     CEFOXITIN SCREEN Value in next row Resistant      POSITIVECEFOXITIN SCREEN - This test may be used to predict mecA-mediated oxacillin resistance, and it is based on the cefoxitin disk screen test.  The cefoxitin screen and oxacillin work in combination to determine the final interpretation reported for oxacillin.     Inducible Clindamycin Value in next row Sensitive      POSITIVECEFOXITIN SCREEN - This test may be used to predict mecA-mediated oxacillin resistance, and it is based on the cefoxitin disk screen test.  The cefoxitin screen and oxacillin work in combination to determine the final interpretation reported for oxacillin.     TETRACYCLINE Value in next row Resistant      RESISTANT>=16    ERYTHROMYCIN Value in next row Sensitive      SENSITIVE0.5    CLINDAMYCIN Value in next row Sensitive      SENSITIVE<=0.25    LINEZOLID Value in next row Sensitive      SENSITIVE2    LEVOFLOXACIN Value in next row Resistant      RESISTANT>=8    * HEAVY GROWTH METHICILLIN RESISTANT STAPHYLOCOCCUS AUREUS     Studies: Dg Abd 1 View  12/30/2014  CLINICAL DATA:  Vomiting, altered mental status, acute renal failure EXAM: ABDOMEN - 1 VIEW COMPARISON:  None. FINDINGS: No abnormally dilated loops of bowel. Air is seen into the rectum. Right upper quadrant cholecystectomy clips noted. L4-5 posterior laminectomy and fusion noted. No abnormal opacities over the abdomen. IMPRESSION: Pain with no acute abnormalities Electronically Signed    By: Skipper Cliche M.D.   On: 12/30/2014 16:30    Scheduled Meds: . amLODipine  10 mg Oral Daily  . aspirin  81 mg Oral BH-q7a  . atorvastatin  20 mg Oral q1800  . Chlorhexidine Gluconate Cloth  6 each Topical Q0600  . clindamycin  300 mg Oral 3 times per day  . clopidogrel  75 mg Oral Daily  . heparin  5,000 Units Subcutaneous 3 times per day  . memantine  28 mg Oral Daily  . metoprolol succinate  25 mg Oral Daily  . mupirocin ointment  1 application Nasal BID  . pantoprazole  40 mg Oral BID  . QUEtiapine  25 mg Oral QHS  . rOPINIRole  2 mg Oral TID  . sodium chloride  3 mL Intravenous Q12H   Continuous Infusions: . sodium chloride 50 mL/hr at 12/31/14 0400    Assessment/Plan:  1. Acute renal failure- improved with IV fluid hydration. Patient still had a Foley catheter and this morning. I will get rid of the Foley catheter and see if she is able to urinate. 2. Diarrhea with MRSA in the stool culture- DC Cipro and Flagyl and start clindamycin by mouth. Blood cultures were negative on admission. 3. Acute delirium- seems to have improved. 4. Hypokalemia- replace potassium orally 5. Recent CVA- on aspirin, Plavix and atorvastatin. Back to rehabilitation, likely tomorrow 6. Essential hypertension- blood pressure little borderline today continue to monitor on usual medications 7. Vomiting yesterday- unable to tell if she ate breakfast today. We'll see how lunch and dinner go today.  Code Status:     Code Status Orders        Start     Ordered   12/27/14 1959  Full code   Continuous     12/27/14 1958    Advance Directive Documentation        Most Recent Value  Type of Advance Directive  Healthcare Power of Attorney   Pre-existing out of facility DNR order (yellow form or pink MOST form)     "MOST" Form in Place?       Family Communication: Spoke with the daughter on the phone Disposition Plan: Likely to rehabilitation tomorrow  Time spent: 25 minutes  Loletha Grayer  [facility]

## 2014-12-31 NOTE — Care Management Important Message (Signed)
Important Message  Patient Details  Name: ALESE FURNISS MRN: [id number] Date of Birth: 1928-11-25   Medicare Important Message Given:  Yes-second notification given    Katrina Stack, RN 12/31/2014, 4:08 PM

## 2014-12-31 NOTE — Care Management (Signed)
It was thought that patient would be stable for discharge today but she had vomiting yesterday and has not taken adequate oral intake.  Patient is to discrage to skilled nursing.  Anticipate this will occur within next 24 hours if tolerates oral nutrition.  Started on oral  Clindamycin for positive  stool culture- MRSA.  Updated CSW

## 2015-01-01 LAB — CULTURE, BLOOD (ROUTINE X 2)
CULTURE: NO GROWTH
CULTURE: NO GROWTH

## 2015-01-01 MED ORDER — CLINDAMYCIN HCL 300 MG PO CAPS: 300.0000 mg | ORAL_CAPSULE | Freq: Three times a day (TID) | ORAL | Status: DC

## 2015-01-01 MED ORDER — ENSURE ENLIVE PO LIQD: 237.0000 mL | Freq: Three times a day (TID) | ORAL | Status: DC

## 2015-01-01 MED ORDER — METOPROLOL SUCCINATE ER 50 MG PO TB24: 50.0000 mg | ORAL_TABLET | Freq: Every day | ORAL | Status: DC

## 2015-01-01 MED ORDER — ALPRAZOLAM 0.25 MG PO TABS: 0.2500 mg | ORAL_TABLET | Freq: Every day | ORAL | Status: DC | PRN

## 2015-01-01 NOTE — Discharge Instructions (Signed)
Can restart Trimethoprim 100mg  po nightly after clindamycin course finished  Dysphagia 3 diet

## 2015-01-01 NOTE — Progress Notes (Signed)
CSW informed by Dr. Leslye Peer that patient is medically ready to discharge to SNF.  Call to patient's family spoke to daughter Onalee Hua she spoke to MD and is in a agreement with plan.  Call to SNF-- [facility] to confirm that patient's bed is ready. Provided patient's room number 908 and number to call for report [phone] .  Call to [facility] to obtain insurance auth.  # [id number]  CSW updated FL2 and faxed with Rx's.  Discharge packet completed.   RN will call report and patient will discharge to [facility] via EMS. No additional CSW services needed at this time  CSW signing off.  Casimer Lanius. Latanya Presser, MSW Clinical Social Work Department Emergency Room [phone] 12:49 PM

## 2015-01-01 NOTE — Progress Notes (Signed)
Pt is alert only to self, VSS, NSR on tele and refusing all meds. Order to bladder scan pt and 220ml resulted. Ok by MD and order to D/C pt back to [facility]. IV and tele removed. Report called to [place] at [facility]. Pt currently awaiting EMS for D/C.

## 2015-01-01 NOTE — Discharge Summary (Signed)
[place] [facility] [place] NAME: Amanda Hogan    MR#:  [id number]  DATE OF BIRTH:  May 21, 1928  DATE OF ADMISSION:  12/27/2014 ADMITTING PHYSICIAN: Nicholes Mango, MD  DATE OF DISCHARGE: 01/01/2015  PRIMARY CARE PHYSICIAN: Delia Chimes, NP    ADMISSION DIAGNOSIS:  AKI (acute kidney injury) ([place]) [N17.9] Acute renal failure, unspecified acute renal failure type ([place]) [N17.9] Altered mental status, unspecified altered mental status type [R41.82] Diarrhea, unspecified type [R19.7]  DISCHARGE DIAGNOSIS:  Active Problems:   Acute kidney injury ([place])   SECONDARY DIAGNOSIS:   Past Medical History  Diagnosis Date  . Hypertension   . Arthritis   . High cholesterol   . Acid reflux   . TIA (transient ischemic attack)   . Restless leg syndrome   . Complication of anesthesia     "w/my back; gave me too much; thought I'd had stroke but didn't"  . Heart murmur   . Stroke [facility])     "said it looked like bullet holes; had a bunch"  . Angina   . Exertional dyspnea   . Sinus malignant neoplasm ([place])   . Fibromyalgia   . Memory disorder, possibly not taking home meds at times 07/22/2011  . Anxiety 02/23/2012  . Dementia 02/23/2012  . Kidney stone   . HTN (hypertension)   . Mild carotid artery disease ([place]) 08/05/11    carotid dopplers, mild disease  . Chest pain     negative lexiscan myoview 53/13, last echo 02/24/12-EF 83-151, mid systolic obliteration of the LV, cavity aortic sclerosis    HOSPITAL COURSE:   1. Acute renal failure on chronic kidney disease stage III. Likely all prerenal from dehydration. Patient must eat and drink well. Lasix and spironolactone were held and patient was given IV fluid hydration. Creatinine improved from 3.01 down to 1.3. 2. Diarrhea- stool culture was positive for MRSA. I will treat that with clindamycin another 6 days. 3. Recent stroke- patient can go back to rehabilitation for physical therapy. Continue  aspirin and Plavix and statin. Patient is on dysphagia 3 diet 4. Hypertension essential- Toprol increased to 50 mg. Can go back on the spironolactone upon discharge 5. Restless leg syndrome on Requip 6. Hypokalemia- replaced orally while in the hospital. No replacement when going back on spironolactone. 7. I will check a bladder scan after patient urinates. The patient did have a Foley catheter in. I want to make sure this does not have to go back in prior to discharge. If it does need to go back and then the patient will need urology as outpatient and Foley care at the facility.  DISCHARGE CONDITIONS:   Satisfactory  CONSULTS OBTAINED:  Treatment Team:  Nicholes Mango, MD [name] Dana, MD  DRUG ALLERGIES:   Allergies  Allergen Reactions  . Iohexol Shortness Of Breath and Itching  . Codeine Sulfate Other (See Comments)    Unknown reaction.  . Contrast Media [Iodinated Diagnostic Agents] Other (See Comments)    Reaction:  Unknown     DISCHARGE MEDICATIONS:   Current Discharge Medication List    START taking these medications   Details  clindamycin (CLEOCIN) 300 MG capsule Take 1 capsule (300 mg total) by mouth every 8 (eight) hours. Qty: 18 capsule, Refills: 0    feeding supplement, ENSURE ENLIVE, (ENSURE ENLIVE) LIQD Take 237 mLs by mouth 3 (three) times daily with meals. Qty: 237 mL, Refills: 12      CONTINUE these medications which have CHANGED  Details  ALPRAZolam (XANAX) 0.25 MG tablet Take 1 tablet (0.25 mg total) by mouth daily as needed for anxiety. Qty: 30 tablet, Refills: 0    metoprolol succinate (TOPROL-XL) 50 MG 24 hr tablet Take 1 tablet (50 mg total) by mouth daily. Take with or immediately following a meal. Qty: 30 tablet, Refills: 0      CONTINUE these medications which have NOT CHANGED   Details  acetaminophen (TYLENOL) 500 MG tablet Take 1,000 mg by mouth every 6 (six) hours as needed for mild pain or headache.     amLODipine (NORVASC) 10 MG  tablet Take 10 mg by mouth daily.    aspirin (ASPIRIN CHILDRENS) 81 MG chewable tablet Chew 81 mg by mouth every morning.    atorvastatin (LIPITOR) 80 MG tablet Take one tablet by mouth once daily for cholesterol    clopidogrel (PLAVIX) 75 MG tablet Take 75 mg by mouth daily.    gabapentin (NEURONTIN) 300 MG capsule Take 300 mg by mouth at bedtime.    memantine (NAMENDA XR) 28 MG CP24 24 hr capsule Take 28 mg by mouth daily.    nitroGLYCERIN (NITROSTAT) 0.4 MG SL tablet Place 0.4 mg under the tongue every 5 (five) minutes as needed for chest pain.     pantoprazole (PROTONIX) 40 MG tablet Take 40 mg by mouth 2 (two) times daily.     QUEtiapine (SEROQUEL) 25 MG tablet Take 1 tablet (25 mg total) by mouth at bedtime.    !! rOPINIRole (REQUIP) 0.5 MG tablet Take 0.5 mg by mouth at bedtime.    !! rOPINIRole (REQUIP) 2 MG tablet Take 2 mg by mouth 3 (three) times daily.     spironolactone (ALDACTONE) 25 MG tablet Take 25 mg by mouth daily.     !! - Potential duplicate medications found. Please discuss with provider.    STOP taking these medications     celecoxib (CELEBREX) 200 MG capsule      diclofenac (VOLTAREN) 50 MG EC tablet      furosemide (LASIX) 20 MG tablet      tolterodine (DETROL) 2 MG tablet      trimethoprim (TRIMPEX) 100 MG tablet      aspirin EC 81 MG EC tablet          DISCHARGE INSTRUCTIONS:   Follow-up Dr. at rehabilitation and physical therapy.  If you experience worsening of your admission symptoms, develop shortness of breath, life threatening emergency, suicidal or homicidal thoughts you must seek medical attention immediately by calling 911 or calling your MD immediately  if symptoms less severe.  You Must read complete instructions/literature along with all the possible adverse reactions/side effects for all the Medicines you take and that have been prescribed to you. Take any new Medicines after you have completely understood and accept all the  possible adverse reactions/side effects.   Please note  You were cared for by a hospitalist during your hospital stay. If you have any questions about your discharge medications or the care you received while you were in the hospital after you are discharged, you can call the unit and asked to speak with the hospitalist on call if the hospitalist that took care of you is not available. Once you are discharged, your primary care physician will handle any further medical issues. Please note that NO REFILLS for any discharge medications will be authorized once you are discharged, as it is imperative that you return to your primary care physician (or establish a relationship with a  primary care physician if you do not have one) for your aftercare needs so that they can reassess your need for medications and monitor your lab values.    Today   CHIEF COMPLAINT:   Chief Complaint  Patient presents with  . Weakness    HISTORY OF PRESENT ILLNESS:  Amanda Hogan  is a 81 y.o. female with a known history of recent stroke presented back with weakness. Found to be in acute renal failure.   VITAL SIGNS:  Blood pressure 164/69, pulse 71, temperature 97.8 F (36.6 C), temperature source Oral, resp. rate 19, height 5\' 5"  (1.651 m), weight 85.73 kg (189 lb), SpO2 95 %.    PHYSICAL EXAMINATION:  GENERAL:  81 y.o.-year-old patient lying in the bed with no acute distress.  EYES: Pupils equal, round, reactive to light and accommodation. No scleral icterus. Extraocular muscles intact.  HEENT: Head atraumatic, normocephalic. Oropharynx and nasopharynx clear.  NECK:  Supple, no jugular venous distention. No thyroid enlargement, no tenderness.  LUNGS: Normal breath sounds bilaterally, no wheezing, rales,rhonchi or crepitation. No use of accessory muscles of respiration.  CARDIOVASCULAR: S1, S2 normal. No murmurs, rubs, or gallops.  ABDOMEN: Soft, non-tender, non-distended. Bowel sounds present. No  organomegaly or mass. Some fullness over the bladder.  EXTREMITIES: Trace pedal edema, no cyanosis, or clubbing.  NEUROLOGIC: Cranial nerves II through XII are intact. Muscle strength 4+/5 in all extremities. Sensation intact. Gait not checked.  PSYCHIATRIC: The patient is alert and answers yes or no questions appropriately.  SKIN: No obvious rash, lesion, or ulcer.   DATA REVIEW:   CBC  Recent Labs Lab 12/31/14 0455  WBC 8.0  HGB 10.8*  HCT 31.7*  PLT 240    Chemistries   Recent Labs Lab 12/27/14 1436  12/31/14 0455  NA 137  < > 145  K 5.3*  < > 3.2*  CL 103  < > 112*  CO2 23  < > 26  GLUCOSE 102*  < > 110*  BUN 84*  < > 17  CREATININE 5.13*  < > 1.30*  CALCIUM 9.3  < > 8.9  AST 22  --   --   ALT 14  --   --   ALKPHOS 82  --   --   BILITOT 1.3*  --   --   < > = values in this interval not displayed.  Cardiac Enzymes  Recent Labs Lab 12/27/14 1436  TROPONINI <0.03    Microbiology Results  Results for orders placed or performed during the hospital encounter of 12/27/14  Culture, blood (routine x 2)     Status: None (Preliminary result)   Collection Time: 12/27/14  2:19 PM  Result Value Ref Range Status   Specimen Description BLOOD LEFT ASSIST CONTROL  Final   Special Requests BOTTLES DRAWN AEROBIC AND ANAEROBIC 4CC  Final   Culture NO GROWTH 4 DAYS  Final   Report Status PENDING  Incomplete  C difficile quick scan w PCR reflex     Status: None   Collection Time: 12/27/14  4:42 PM  Result Value Ref Range Status   C Diff antigen NEGATIVE NEGATIVE Final   C Diff toxin NEGATIVE NEGATIVE Final   C Diff interpretation Negative for C. difficile  Final  Stool culture     Status: None   Collection Time: 12/27/14  4:42 PM  Result Value Ref Range Status   Specimen Description STOOL  Final   Special Requests Normal  Final   Culture  Final    NO SALMONELLA OR SHIGELLA ISOLATED NO CAMPYLOBACTER DETECTED No Pathogenic E. coli detected    Report Status  12/29/2014 FINAL  Final  Culture, blood (routine x 2)     Status: None (Preliminary result)   Collection Time: 12/27/14  7:03 PM  Result Value Ref Range Status   Specimen Description BLOOD RIGHT HAND  Final   Special Requests BOTTLES DRAWN AEROBIC AND ANAEROBIC 4CC  Final   Culture NO GROWTH 4 DAYS  Final   Report Status PENDING  Incomplete  Urine culture     Status: None   Collection Time: 12/27/14 11:40 PM  Result Value Ref Range Status   Specimen Description URINE, CATHETERIZED  Final   Special Requests Immunocompromised  Final   Culture MULTIPLE SPECIES PRESENT, SUGGEST RECOLLECTION  Final   Report Status 12/29/2014 FINAL  Final  MRSA PCR Screening     Status: Abnormal   Collection Time: 12/28/14  5:30 AM  Result Value Ref Range Status   MRSA by PCR POSITIVE (A) NEGATIVE Final    Comment:        The GeneXpert MRSA Assay (FDA approved for NASAL specimens only), is one component of a comprehensive MRSA colonization surveillance program. It is not intended to diagnose MRSA infection nor to guide or monitor treatment for MRSA infections. CRITICAL RESULT CALLED TO, READ BACK BY AND VERIFIED WITH: TAMMY TODD,RN 12/28/2014 0856 BY JRS   C difficile quick scan w PCR reflex     Status: None   Collection Time: 12/28/14  6:35 AM  Result Value Ref Range Status   C Diff antigen NEGATIVE NEGATIVE Final   C Diff toxin NEGATIVE NEGATIVE Final   C Diff interpretation Negative for C. difficile  Final  Stool culture     Status: None   Collection Time: 12/29/14 11:00 AM  Result Value Ref Range Status   Specimen Description STOOL  Final   Special Requests Normal  Final   Culture   Final    HEAVY GROWTH METHICILLIN RESISTANT STAPHYLOCOCCUS AUREUS No Pathogenic E. coli detected NO CAMPYLOBACTER DETECTED NORMAL FLORA ABSENT CRITICAL RESULT CALLED TO, READ BACK BY AND VERIFIED WITH: DR. Leslye Peer AT 0830 12/31/14 D[facility]    Report Status 12/31/2014 FINAL  Final   Organism ID, Bacteria  METHICILLIN RESISTANT STAPHYLOCOCCUS AUREUS  Final      Susceptibility   Methicillin resistant staphylococcus aureus - MIC*    CIPROFLOXACIN >=8 RESISTANT Resistant     GENTAMICIN <=0.5 SENSITIVE Sensitive     OXACILLIN Value in next row Resistant      >=4 MODERATELY RESISTANT    VANCOMYCIN 1 SENSITIVE Sensitive     TRIMETH/SULFA >=320 RESISTANT Resistant     CEFOXITIN SCREEN Value in next row Resistant      POSITIVECEFOXITIN SCREEN - This test may be used to predict mecA-mediated oxacillin resistance, and it is based on the cefoxitin disk screen test.  The cefoxitin screen and oxacillin work in combination to determine the final interpretation reported for oxacillin.     Inducible Clindamycin Value in next row Sensitive      POSITIVECEFOXITIN SCREEN - This test may be used to predict mecA-mediated oxacillin resistance, and it is based on the cefoxitin disk screen test.  The cefoxitin screen and oxacillin work in combination to determine the final interpretation reported for oxacillin.     TETRACYCLINE Value in next row Resistant      RESISTANT>=16    ERYTHROMYCIN Value in next row Sensitive  SENSITIVE0.5    CLINDAMYCIN Value in next row Sensitive      SENSITIVE<=0.25    LINEZOLID Value in next row Sensitive      SENSITIVE2    LEVOFLOXACIN Value in next row Resistant      RESISTANT>=8    * HEAVY GROWTH METHICILLIN RESISTANT STAPHYLOCOCCUS AUREUS    RADIOLOGY:  Dg Abd 1 View  12/30/2014  CLINICAL DATA:  Vomiting, altered mental status, acute renal failure EXAM: ABDOMEN - 1 VIEW COMPARISON:  None. FINDINGS: No abnormally dilated loops of bowel. Air is seen into the rectum. Right upper quadrant cholecystectomy clips noted. L4-5 posterior laminectomy and fusion noted. No abnormal opacities over the abdomen. IMPRESSION: Pain with no acute abnormalities Electronically Signed   By: Skipper Cliche M.D.   On: 12/30/2014 16:30   Management plans discussed with the patient, family and they  are in agreement.  CODE STATUS:     Code Status Orders        Start     Ordered   12/27/14 1959  Full code   Continuous     12/27/14 1958    Advance Directive Documentation        Most Recent Value   Type of Advance Directive  Healthcare Power of Attorney   Pre-existing out of facility DNR order (yellow form or pink MOST form)     "MOST" Form in Place?        TOTAL TIME TAKING CARE OF THIS PATIENT: 35 minutes.    Loletha Grayer M.D on 01/01/2015 at 8:29 AM  Between 7am to 6pm - Pager - [phone]  After 6pm go to [email or web address] - password [email or web address] [facility]  [facility]  Office  [phone]  CC: Primary care physician; Delia Chimes, NP

## 2015-01-06 ENCOUNTER — Encounter: Payer: Self-pay | Admitting: Internal Medicine

## 2015-01-06 ENCOUNTER — Non-Acute Institutional Stay (SKILLED_NURSING_FACILITY): Payer: PPO | Admitting: Internal Medicine

## 2015-01-06 DIAGNOSIS — K219 Gastro-esophageal reflux disease without esophagitis: Secondary | ICD-10-CM

## 2015-01-06 DIAGNOSIS — A4902 Methicillin resistant Staphylococcus aureus infection, unspecified site: Secondary | ICD-10-CM | POA: Diagnosis not present

## 2015-01-06 DIAGNOSIS — I5032 Chronic diastolic (congestive) heart failure: Secondary | ICD-10-CM | POA: Diagnosis not present

## 2015-01-06 DIAGNOSIS — G2581 Restless legs syndrome: Secondary | ICD-10-CM

## 2015-01-06 DIAGNOSIS — F0280 Dementia in other diseases classified elsewhere without behavioral disturbance: Secondary | ICD-10-CM

## 2015-01-06 DIAGNOSIS — I4891 Unspecified atrial fibrillation: Secondary | ICD-10-CM

## 2015-01-06 DIAGNOSIS — I639 Cerebral infarction, unspecified: Secondary | ICD-10-CM

## 2015-01-06 DIAGNOSIS — F0150 Vascular dementia without behavioral disturbance: Secondary | ICD-10-CM

## 2015-01-06 DIAGNOSIS — N179 Acute kidney failure, unspecified: Secondary | ICD-10-CM | POA: Diagnosis not present

## 2015-01-06 DIAGNOSIS — N189 Chronic kidney disease, unspecified: Secondary | ICD-10-CM

## 2015-01-06 DIAGNOSIS — N183 Chronic kidney disease, stage 3 unspecified: Secondary | ICD-10-CM

## 2015-01-06 DIAGNOSIS — R5381 Other malaise: Secondary | ICD-10-CM | POA: Diagnosis not present

## 2015-01-06 DIAGNOSIS — D631 Anemia in chronic kidney disease: Secondary | ICD-10-CM

## 2015-01-06 DIAGNOSIS — R531 Weakness: Secondary | ICD-10-CM | POA: Diagnosis not present

## 2015-01-06 NOTE — Progress Notes (Signed)
Patient ID: Amanda Hogan, female   DOB: 1928-07-10, 81 y.o.   MRN: [id number]     [facility]  PCP: Amanda Chimes, NP  Code Status: Full Code   Allergies  Allergen Reactions  . Iohexol Shortness Of Breath and Itching  . Codeine Sulfate Other (See Comments)    Unknown reaction.  . Contrast Media [Iodinated Diagnostic Agents] Other (See Comments)    Reaction:  Unknown     Chief Complaint  Patient presents with  . New Admit To SNF    New admission      HPI:  81 y.o. patient is here for short term rehabilitation post hospital admission from 12/27/14-01/01/15 with acute renal failure and diarrhea. She responded well to iv fluids. She was diagnosed to have MRSA in her stool, was started on contact precautions and clindamycin. Her lasix and spironolactone were held. She has PMH of hypertension, hyperlipidemia, TIA, dementia, recurrent urinary tract infections among others. She is seen in the room today. HPI and ROS limited with her dementia. Per staff, no further loose stool reported. She wanders around the facility and has difficult time following instruction/ direction.   Review of Systems:  Unable to obtain   Past Medical History  Diagnosis Date  . Hypertension   . Arthritis   . High cholesterol   . Acid reflux   . TIA (transient ischemic attack)   . Restless leg syndrome   . Complication of anesthesia     "w/my back; gave me too much; thought I'd had stroke but didn't"  . Heart murmur   . Stroke [facility])     "said it looked like bullet holes; had a bunch"  . Angina   . Exertional dyspnea   . Sinus malignant neoplasm ([place])   . Fibromyalgia   . Memory disorder, possibly not taking home meds at times 07/22/2011  . Anxiety 02/23/2012  . Dementia 02/23/2012  . Kidney stone   . HTN (hypertension)   . Mild carotid artery disease ([place]) 08/05/11    carotid dopplers, mild disease  . Chest pain     negative lexiscan myoview 53/13, last echo 02/24/12-EF 32-355,  mid systolic obliteration of the LV, cavity aortic sclerosis   Past Surgical History  Procedure Laterality Date  . Cholecystectomy  2005  . Vaginal hysterectomy    . Breast cyst excision      left  . Hernia repair  2007    "stomach"  . Lung surgery      "made 3 holes and pulled gel out"  . Knee cartilage surgery      right  . Lumbar spine surgery      "put box in my lower back"  . Elbow bursa surgery      left  . Cataract extraction w/ intraocular lens  implant, bilateral    . Eye surgery  1940    fixed my crossed eyes"  . Kidney stone surgery      "right side; cut it out"  . Carpal tunnel release      right   Social History:   reports that she has never smoked. She has never used smokeless tobacco. She reports that she does not drink alcohol or use illicit drugs.  Family History  Problem Relation Age of Onset  . Diabetes Neg Hx     Medications:   Medication List       This list is accurate as of: 01/06/15  3:32 PM.  Always use your  most recent med list.               acetaminophen 500 MG tablet  Commonly known as:  TYLENOL  Take 1,000 mg by mouth every 6 (six) hours as needed for mild pain or headache.     ALPRAZolam 0.25 MG tablet  Commonly known as:  XANAX  Take 1 tablet (0.25 mg total) by mouth daily as needed for anxiety.     amLODipine 10 MG tablet  Commonly known as:  NORVASC  Take 10 mg by mouth daily.     ASPIRIN CHILDRENS 81 MG chewable tablet  Generic drug:  aspirin  Chew 81 mg by mouth every morning.     atorvastatin 80 MG tablet  Commonly known as:  LIPITOR  Take one tablet by mouth once daily for cholesterol     clindamycin 300 MG capsule  Commonly known as:  CLEOCIN  Take 1 capsule (300 mg total) by mouth every 8 (eight) hours.     clopidogrel 75 MG tablet  Commonly known as:  PLAVIX  Take 75 mg by mouth daily.     gabapentin 300 MG capsule  Commonly known as:  NEURONTIN  Take 300 mg by mouth at bedtime.     metoprolol  succinate 50 MG 24 hr tablet  Commonly known as:  TOPROL-XL  Take 1 tablet (50 mg total) by mouth daily. Take with or immediately following a meal.     NAMENDA XR 28 MG Cp24 24 hr capsule  Generic drug:  memantine  Take 28 mg by mouth daily.     nitroGLYCERIN 0.4 MG SL tablet  Commonly known as:  NITROSTAT  Place 0.4 mg under the tongue every 5 (five) minutes as needed for chest pain.     pantoprazole 40 MG tablet  Commonly known as:  PROTONIX  Take 40 mg by mouth 2 (two) times daily.     QUEtiapine 25 MG tablet  Commonly known as:  SEROQUEL  Take 1 tablet (25 mg total) by mouth at bedtime.     rOPINIRole 2 MG tablet  Commonly known as:  REQUIP  Take 2 mg by mouth 3 (three) times daily.     rOPINIRole 0.5 MG tablet  Commonly known as:  REQUIP  Take 0.5 mg by mouth at bedtime.     spironolactone 25 MG tablet  Commonly known as:  ALDACTONE  Take 25 mg by mouth daily.     UNABLE TO FIND  Med Name: Med Pass 240 cc by mouth three times daily with meals         Physical Exam: Filed Vitals:   01/06/15 1516  BP: 129/67  Pulse: 88  Temp: 97.5 F (36.4 C)  TempSrc: Oral  Resp: 20  SpO2: 95%   General- elderly frail female, in no acute distress Head- normocephalic, atraumatic Throat- moist mucus membrane  Eyes- PERRLA, EOMI, no pallor, no icterus, no discharge, normal conjunctiva, normal sclera Neck- no cervical lymphadenopathy, no jugular vein distension Cardiovascular- normal s1,s2, no murmurs, trace leg edema Respiratory- bilateral clear to auscultation, no wheeze, no rhonchi, no crackles, no use of accessory muscles, on o2 Abdomen- bowel sounds present, soft, non tender Musculoskeletal- able to move all 4 extremities, generalized weakness, on wheelchair  Neurological- unable to assess with her not following command     Labs reviewed: Basic Metabolic Panel:  Recent Labs  10/28/14 1151  12/29/14 0413 12/30/14 0450 12/31/14 0455  NA  --   < > 145 146*  145   K  --   < > 4.2 3.6 3.2*  CL  --   < > 117* 115* 112*  CO2  --   < > 20* 24 26  GLUCOSE  --   < > 91 110* 110*  BUN  --   < > 55* 29* 17  CREATININE  --   < > 3.01* 1.61* 1.30*  CALCIUM  --   < > 8.7* 9.0 8.9  MG 1.8  --   --   --   --   < > = values in this interval not displayed. Liver Function Tests:  Recent Labs  10/27/14 1223 11/20/14 1003 12/27/14 1436  AST 20 29 22   ALT 22 13* 14  ALKPHOS 92 66 82  BILITOT 1.4* 1.1 1.3*  PROT 7.9 8.4* 8.1  ALBUMIN 3.5 4.3 4.0   No results for input(s): LIPASE, AMYLASE in the last 8760 hours. No results for input(s): AMMONIA in the last 8760 hours. CBC:  Recent Labs  10/27/14 1221  12/16/14 1541  12/27/14 1436  12/29/14 0413 12/30/14 0450 12/31/14 0455  WBC 11.0*  < > 7.4  < > 12.2*  < > 9.4 9.9 8.0  NEUTROABS 8.7*  --  5.1  --  9.5*  --   --   --   --   HGB 9.8*  < > 11.8*  < > 11.0*  < > 10.7* 10.2* 10.8*  HCT 30.4*  < > 35.4  < > 33.8*  < > 32.2* 30.2* 31.7*  MCV 94.1  < > 91.1  < > 92.6  < > 93.8 91.6 91.2  PLT 306  < > 323  < > 271  < > 232 254 240  < > = values in this interval not displayed. Cardiac Enzymes:  Recent Labs  10/28/14 1410 11/20/14 1003 12/27/14 1436  TROPONINI 0.18* <0.03 <0.03   BNP: Invalid input(s): POCBNP CBG: No results for input(s): GLUCAP in the last 8760 hours.  Radiological Exams: Ct Head Wo Contrast  12/27/2014  CLINICAL DATA:  Generalized weakness, 1 week duration. EXAM: CT HEAD WITHOUT CONTRAST TECHNIQUE: Contiguous axial images were obtained from the base of the skull through the vertex without intravenous contrast. COMPARISON:  12/16/2014 CT.  12/18/2014 MRI. FINDINGS: No focal abnormality is seen affecting the brainstem. There are a few old small vessel cerebellar infarctions. There is old infarction in the right temporal lobe consistent with right MCA branch vessel territory infarction. There is an old right posterior frontal cortical and subcortical infarction. There are extensive  chronic small vessel ischemic changes throughout the deep white matter. Late subacute infarction in the left basal ganglia was acute on the previous study. No progressive change. No evidence of swelling or hemorrhage. Elsewhere, no evidence of hemorrhage, mass lesion, hydrocephalus or extra-axial collection. No calvarial abnormality. Sinuses, middle ears and mastoids are clear. There is atherosclerotic calcification of the major vessels at the base of the brain. IMPRESSION: No acute insult. Old right temporal lobe and right posterior frontal lobe cortical and subcortical infarctions. Extensive chronic small vessel ischemic changes throughout the brain. Expected evolutionary changes of a late subacute left basal ganglia stroke. No sign of hemorrhage or mass effect. No new stroke. Electronically Signed   By: Nelson Hogan M.D.   On: 12/27/2014 16:08   US Renal  12/27/2014  CLINICAL DATA:  Acute onset of renal insufficiency. Initial encounter. EXAM: RENAL / URINARY TRACT ULTRASOUND COMPLETE COMPARISON:  CT of the abdomen and  pelvis performed 11/30/2012 FINDINGS: Right Kidney: Length: 10.3 cm. Echogenicity within normal limits. No mass or hydronephrosis visualized. Left Kidney: Length: 9.4 cm. Echogenicity within normal limits. No mass or hydronephrosis visualized. Bladder: Appears normal for degree of bladder distention. IMPRESSION: Unremarkable renal ultrasound. Electronically Signed   By: Garald Balding M.D.   On: 12/27/2014 19:08    Assessment/Plan  Physical deconditioning With recent hospital admission with infection and acute renal failure and recent CVA. Will have her work with physical therapy and occupational therapy team to help with gait training and muscle strengthening exercises.fall precautions. Skin care. Encourage to be out of bed. With her memory issue with recent CVA, poor prognosis for improvement with therapy. Monitor clinically, will need care plan meeting with family if no progress  noted  Generalized weakness Will have her work with physical therapy and occupational therapy team to help with gait training and muscle strengthening exercises.fall precautions. Skin care. Encourage to be out of bed.   MRSA stool Infectious diarrhea. Continue and comlete course of clindamycin, hydration to be encouraged. Contact precautions for now  Acute on chronic renal failure Has ckd stage 3. Currently off lasix, monitor bmp  Acute CVA Basal ganglia stroke. Continue ec aspirin 81 mg daily with plavix 75 mg daily and lipitor 80 mg daily.   Mixed Dementia No behavioral disturbance, continue namenda xr 28 mg and celexa 20 mg daily for depression, decline anticipated. Continue seroquel 25 mg daily  afib Rate currently controlled, continue toprol xl 50 mg daily  Chronic CHF euvolemic on exam. Currently on toprol xl 50 mg daily, aldactone 25 mg daily and o2  Anemia of chronic disease Monitor cbc  Hypertension Stable bp, continue norvasc 10 mg daily and toprol xl 50 mg daily, monitor bp  gerd continue protonix for now  RLS Continue requip and gabapentin for now   Goals of care: short term rehabilitation, possible long term care   Labs/tests ordered: cbc, cmp 01/12/15  Family/ staff Communication: reviewed care plan with patient and nursing supervisor    Blanchie Serve, MD  [facility] [phone] (Monday-Friday 8 am - 5 pm) [phone] (afterhours)

## 2015-01-12 ENCOUNTER — Non-Acute Institutional Stay (SKILLED_NURSING_FACILITY): Payer: PPO | Admitting: Nurse Practitioner

## 2015-01-12 ENCOUNTER — Encounter: Payer: Self-pay | Admitting: Nurse Practitioner

## 2015-01-12 DIAGNOSIS — A4902 Methicillin resistant Staphylococcus aureus infection, unspecified site: Secondary | ICD-10-CM | POA: Diagnosis not present

## 2015-01-12 DIAGNOSIS — N189 Chronic kidney disease, unspecified: Secondary | ICD-10-CM | POA: Diagnosis not present

## 2015-01-12 DIAGNOSIS — R5381 Other malaise: Secondary | ICD-10-CM | POA: Diagnosis not present

## 2015-01-12 DIAGNOSIS — N179 Acute kidney failure, unspecified: Secondary | ICD-10-CM

## 2015-01-12 DIAGNOSIS — N183 Chronic kidney disease, stage 3 unspecified: Secondary | ICD-10-CM

## 2015-01-12 DIAGNOSIS — I10 Essential (primary) hypertension: Secondary | ICD-10-CM | POA: Diagnosis not present

## 2015-01-12 DIAGNOSIS — I639 Cerebral infarction, unspecified: Secondary | ICD-10-CM | POA: Diagnosis not present

## 2015-01-12 DIAGNOSIS — F0150 Vascular dementia without behavioral disturbance: Secondary | ICD-10-CM

## 2015-01-12 DIAGNOSIS — I5032 Chronic diastolic (congestive) heart failure: Secondary | ICD-10-CM | POA: Diagnosis not present

## 2015-01-12 DIAGNOSIS — I4891 Unspecified atrial fibrillation: Secondary | ICD-10-CM

## 2015-01-12 DIAGNOSIS — F0280 Dementia in other diseases classified elsewhere without behavioral disturbance: Secondary | ICD-10-CM | POA: Diagnosis not present

## 2015-01-12 DIAGNOSIS — K219 Gastro-esophageal reflux disease without esophagitis: Secondary | ICD-10-CM

## 2015-01-12 DIAGNOSIS — D631 Anemia in chronic kidney disease: Secondary | ICD-10-CM

## 2015-01-12 NOTE — Progress Notes (Signed)
Nursing Home Location:  [place] of Service: SNF (31)  PCP: Delia Chimes, NP  Allergies  Allergen Reactions  . Iohexol Shortness Of Breath and Itching  . Codeine Sulfate Other (See Comments)    Unknown reaction.  . Contrast Media [Iodinated Diagnostic Agents] Other (See Comments)    Reaction:  Unknown     Chief Complaint  Patient presents with  . Discharge Note    HPI:  Patient is a 81 y.o. female seen today at [facility] for discharge to [place]. She has PMH of hypertension, hyperlipidemia, TIA, dementia, recurrent urinary tract infections. Pt at [facility] for short term rehabilitation post hospital admission from 12/27/14-01/01/15 with acute renal failure and MRSA diarrhea. She responded well to iv fluids and clindamycin. Patient currently doing well with therapy, now stable to discharge with home health.  Review of Systems:  Review of Systems  Unable to perform ROS: Dementia    Past Medical History  Diagnosis Date  . Hypertension   . Arthritis   . High cholesterol   . Acid reflux   . TIA (transient ischemic attack)   . Restless leg syndrome   . Complication of anesthesia     "w/my back; gave me too much; thought I'd had stroke but didn't"  . Heart murmur   . Stroke [facility])     "said it looked like bullet holes; had a bunch"  . Angina   . Exertional dyspnea   . Sinus malignant neoplasm ([place])   . Fibromyalgia   . Memory disorder, possibly not taking home meds at times 07/22/2011  . Anxiety 02/23/2012  . Dementia 02/23/2012  . Kidney stone   . HTN (hypertension)   . Mild carotid artery disease ([place]) 08/05/11    carotid dopplers, mild disease  . Chest pain     negative lexiscan myoview 53/13, last echo 02/24/12-EF 50-932, mid systolic obliteration of the LV, cavity aortic sclerosis   Past Surgical History  Procedure Laterality Date  . Cholecystectomy  2005  . Vaginal hysterectomy    . Breast cyst excision      left   . Hernia repair  2007    "stomach"  . Lung surgery      "made 3 holes and pulled gel out"  . Knee cartilage surgery      right  . Lumbar spine surgery      "put box in my lower back"  . Elbow bursa surgery      left  . Cataract extraction w/ intraocular lens  implant, bilateral    . Eye surgery  1940    fixed my crossed eyes"  . Kidney stone surgery      "right side; cut it out"  . Carpal tunnel release      right   Social History:   reports that she has never smoked. She has never used smokeless tobacco. She reports that she does not drink alcohol or use illicit drugs.  Family History  Problem Relation Age of Onset  . Diabetes Neg Hx     Medications: Patient's Medications  New Prescriptions   No medications on file  Previous Medications   ACETAMINOPHEN (TYLENOL) 500 MG TABLET    Take 1,000 mg by mouth every 6 (six) hours as needed for mild pain or headache.    ALPRAZOLAM (XANAX) 0.25 MG TABLET    Take 1 tablet (0.25 mg total) by mouth daily as needed for anxiety.  AMLODIPINE (NORVASC) 10 MG TABLET    Take 10 mg by mouth daily.   ASPIRIN (ASPIRIN CHILDRENS) 81 MG CHEWABLE TABLET    Chew 81 mg by mouth every morning.   ATORVASTATIN (LIPITOR) 80 MG TABLET    Take one tablet by mouth once daily for cholesterol   CLOPIDOGREL (PLAVIX) 75 MG TABLET    Take 75 mg by mouth daily.   GABAPENTIN (NEURONTIN) 300 MG CAPSULE    Take 300 mg by mouth at bedtime.   MEMANTINE (NAMENDA XR) 28 MG CP24 24 HR CAPSULE    Take 28 mg by mouth daily.   METOPROLOL SUCCINATE (TOPROL-XL) 50 MG 24 HR TABLET    Take 1 tablet (50 mg total) by mouth daily. Take with or immediately following a meal.   NITROGLYCERIN (NITROSTAT) 0.4 MG SL TABLET    Place 0.4 mg under the tongue every 5 (five) minutes as needed for chest pain.    PANTOPRAZOLE (PROTONIX) 40 MG TABLET    Take 40 mg by mouth 2 (two) times daily.    QUETIAPINE (SEROQUEL) 25 MG TABLET    Take 1 tablet (25 mg total) by mouth at bedtime.    ROPINIROLE (REQUIP) 0.5 MG TABLET    Take 0.5 mg by mouth at bedtime.   ROPINIROLE (REQUIP) 2 MG TABLET    Take 2 mg by mouth 3 (three) times daily.    SPIRONOLACTONE (ALDACTONE) 25 MG TABLET    Take 25 mg by mouth daily.   UNABLE TO FIND    Med Name: Med Pass 240 cc by mouth three times daily with meals  Modified Medications   No medications on file  Discontinued Medications   CLINDAMYCIN (CLEOCIN) 300 MG CAPSULE    Take 1 capsule (300 mg total) by mouth every 8 (eight) hours.     Physical Exam: Filed Vitals:   01/12/15 1528  BP: 135/69  Pulse: 71  Temp: 98.7 F (37.1 C)  Resp: 18  SpO2: 96%    Physical Exam  Constitutional: She appears well-developed and well-nourished. No distress.  HENT:  Head: Normocephalic and atraumatic.  Mouth/Throat: Oropharynx is clear and moist. No oropharyngeal exudate.  Eyes: Conjunctivae are normal. Pupils are equal, round, and reactive to light.  Neck: Normal range of motion. Neck supple.  Cardiovascular: Normal rate, regular rhythm and normal heart sounds.   Pulmonary/Chest: Effort normal and breath sounds normal.  Abdominal: Soft. Bowel sounds are normal.  Musculoskeletal: She exhibits no edema or tenderness.  Neurological: She is alert.  Skin: Skin is warm and dry. She is not diaphoretic.  Psychiatric: She has a normal mood and affect.    Labs reviewed: Basic Metabolic Panel:  Recent Labs  10/28/14 1151  12/29/14 0413 12/30/14 0450 12/31/14 0455  NA  --   < > 145 146* 145  K  --   < > 4.2 3.6 3.2*  CL  --   < > 117* 115* 112*  CO2  --   < > 20* 24 26  GLUCOSE  --   < > 91 110* 110*  BUN  --   < > 55* 29* 17  CREATININE  --   < > 3.01* 1.61* 1.30*  CALCIUM  --   < > 8.7* 9.0 8.9  MG 1.8  --   --   --   --   < > = values in this interval not displayed. Liver Function Tests:  Recent Labs  10/27/14 1223 11/20/14 1003 12/27/14 1436  AST  20 29 22   ALT 22 13* 14  ALKPHOS 92 66 82  BILITOT 1.4* 1.1 1.3*  PROT 7.9 8.4* 8.1    ALBUMIN 3.5 4.3 4.0   No results for input(s): LIPASE, AMYLASE in the last 8760 hours. No results for input(s): AMMONIA in the last 8760 hours. CBC:  Recent Labs  10/27/14 1221  12/16/14 1541  12/27/14 1436  12/29/14 0413 12/30/14 0450 12/31/14 0455  WBC 11.0*  < > 7.4  < > 12.2*  < > 9.4 9.9 8.0  NEUTROABS 8.7*  --  5.1  --  9.5*  --   --   --   --   HGB 9.8*  < > 11.8*  < > 11.0*  < > 10.7* 10.2* 10.8*  HCT 30.4*  < > 35.4  < > 33.8*  < > 32.2* 30.2* 31.7*  MCV 94.1  < > 91.1  < > 92.6  < > 93.8 91.6 91.2  PLT 306  < > 323  < > 271  < > 232 254 240  < > = values in this interval not displayed. TSH:  Recent Labs  10/28/14 1151  TSH 4.336   A1C: Lab Results  Component Value Date   HGBA1C 5.8 12/16/2014   Lipid Panel:  Recent Labs  12/16/14 1541  CHOL 114  HDL 34*  LDLCALC 39  TRIG 207*  CHOLHDL 3.4     Assessment/Plan 1. MRSA (methicillin resistant Staphylococcus aureus) infection Infectious diarrhea has now resolved, completed antibiotic course.   2. Acute renal failure superimposed on stage 3 chronic kidney disease (HCC) Cr stable on recent labs  3. Acute CVA (cerebrovascular accident) ([place]) Stable, Continue ASA 81 mg daily with plavix 75 mg daily and lipitor 80 mg daily  4. 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 Atrial fibrillation, unspecified type ([place]) Rate controlled on toprol xl 50 mg daily  6. Chronic diastolic congestive heart failure (HCC) euvolemic, off lasix due to acute renal failure, cont on toprol xl 50 mg daily, aldactone 25 mg daily and chronic o2  7. Anemia in chronic kidney disease -hgb stable  8. Essential hypertension, benign Stable, continue norvasc 10 mg daily and toprol xl 50 mg daily  9. Gastroesophageal reflux disease, esophagitis presence not specified conts on protonix daily   10. Physical  deconditioning With recent hospital admission with infection and acute renal failure and recent CVA. Improving with therapy. pt is stable for discharge to [facility]- will need PT/OT per home health. DME needed includes WC.  Rx written.  will need to follow up with PCP within 2 weeks.      Carlos American. Harle Battiest  [facility] [phone] 8 am - 5 pm) [phone] (after hours)

## 2015-01-22 ENCOUNTER — Encounter: Payer: PPO | Admitting: Internal Medicine

## 2015-01-22 ENCOUNTER — Encounter: Payer: Self-pay | Admitting: *Deleted

## 2015-01-22 NOTE — Progress Notes (Deleted)
ELECTROPHYSIOLOGY CONSULT NOTE  Patient ID: ARDENE REMLEY, MRN: [id number], DOB/AGE: March 13, 1929 81 y.o. Admit date: (Not on file) Date of Consult: 01/22/2015  Primary Physician: Delia Chimes, NP Primary Cardiologist: ***  Chief Complaint: ***   HPI KENLEIGH TOBACK is a 81 y.o. female    Sghe has hx of Afib and prior stroke Rx with ASA Plavix;  Echo 8/16>> normal LV function with mild LAE RAE and TR with Pulm HTN 54 mm;  Myoview 2013 neg for ischemia  8/16 Admitted [facility] Acute HFpEF in setting of fever UTI and Afib RVR, thought not candidate for anticoagulation as noncompliant with MEDS.   9/16 admitted [facility] with acute stroke with issue of thromboembolism raised by Nerurology. Discharged on plavix and ASA with which they had been admitted.    Recently admitted to [facility] with weakness and acute renal failure, pk Cr 5.13 >> 1.3 in context of diarrhea  Felt to be prerenal.      Past Medical History  Diagnosis Date  . Hypertension   . Arthritis   . High cholesterol   . Acid reflux   . TIA (transient ischemic attack)   . Restless leg syndrome   . Complication of anesthesia     "w/my back; gave me too much; thought I'd had stroke but didn't"  . Heart murmur   . Stroke [facility])     "said it looked like bullet holes; had a bunch"  . Angina   . Exertional dyspnea   . Sinus malignant neoplasm ([place])   . Fibromyalgia   . Memory disorder, possibly not taking home meds at times 07/22/2011  . Anxiety 02/23/2012  . Dementia 02/23/2012  . Kidney stone   . HTN (hypertension)   . Mild carotid artery disease ([place]) 08/05/11    carotid dopplers, mild disease  . Chest pain     negative lexiscan myoview 53/13, last echo 02/24/12-EF 97-673, mid systolic obliteration of the LV, cavity aortic sclerosis      Surgical History:  Past Surgical History  Procedure Laterality Date  . Cholecystectomy  2005  . Vaginal hysterectomy    . Breast cyst excision      left  . Hernia repair  2007   "stomach"  . Lung surgery      "made 3 holes and pulled gel out"  . Knee cartilage surgery      right  . Lumbar spine surgery      "put box in my lower back"  . Elbow bursa surgery      left  . Cataract extraction w/ intraocular lens  implant, bilateral    . Eye surgery  1940    fixed my crossed eyes"  . Kidney stone surgery      "right side; cut it out"  . Carpal tunnel release      right     Home Meds: Prior to Admission medications   Medication Sig Start Date End Date Taking? Authorizing Provider  acetaminophen (TYLENOL) 500 MG tablet Take 1,000 mg by mouth every 6 (six) hours as needed for mild pain or headache.     Historical Provider, MD  ALPRAZolam Duanne Moron) 0.25 MG tablet Take 1 tablet (0.25 mg total) by mouth daily as needed for anxiety. 01/01/15   Loletha Grayer, MD  amLODipine (NORVASC) 10 MG tablet Take 10 mg by mouth daily.    Historical Provider, MD  aspirin (ASPIRIN CHILDRENS) 81 MG chewable tablet Chew 81 mg by mouth every morning.  Historical Provider, MD  atorvastatin (LIPITOR) 80 MG tablet Take one tablet by mouth once daily for cholesterol    Historical Provider, MD  clopidogrel (PLAVIX) 75 MG tablet Take 75 mg by mouth daily.    Historical Provider, MD  gabapentin (NEURONTIN) 300 MG capsule Take 300 mg by mouth at bedtime.    Historical Provider, MD  memantine (NAMENDA XR) 28 MG CP24 24 hr capsule Take 28 mg by mouth daily.    Historical Provider, MD  metoprolol succinate (TOPROL-XL) 50 MG 24 hr tablet Take 1 tablet (50 mg total) by mouth daily. Take with or immediately following a meal. 01/01/15   Loletha Grayer, MD  nitroGLYCERIN (NITROSTAT) 0.4 MG SL tablet Place 0.4 mg under the tongue every 5 (five) minutes as needed for chest pain.     Historical Provider, MD  pantoprazole (PROTONIX) 40 MG tablet Take 40 mg by mouth 2 (two) times daily.     Historical Provider, MD  QUEtiapine (SEROQUEL) 25 MG tablet Take 1 tablet (25 mg total) by mouth at bedtime. Patient  taking differently: Take 25 mg by mouth every evening.  12/19/14   Henreitta Leber, MD  rOPINIRole (REQUIP) 0.5 MG tablet Take 0.5 mg by mouth at bedtime.    Historical Provider, MD  rOPINIRole (REQUIP) 2 MG tablet Take 2 mg by mouth 3 (three) times daily.     Historical Provider, MD  spironolactone (ALDACTONE) 25 MG tablet Take 25 mg by mouth daily.    Historical Provider, MD  UNABLE TO FIND Med Name: Med Pass 240 cc by mouth three times daily with meals    Historical Provider, MD    Allergies:  Allergies  Allergen Reactions  . Iohexol Shortness Of Breath and Itching  . Codeine Sulfate Other (See Comments)    Unknown reaction.  . Contrast Media [Iodinated Diagnostic Agents] Other (See Comments)    Reaction:  Unknown     Social History   Social History  . Marital Status: Widowed    Spouse Name: N/A  . Number of Children: N/A  . Years of Education: N/A   Occupational History  . Not on file.   Social History Main Topics  . Smoking status: Never Smoker   . Smokeless tobacco: Never Used  . Alcohol Use: No  . Drug Use: No  . Sexual Activity: No   Other Topics Concern  . Not on file   Social History Narrative     Family History  Problem Relation Age of Onset  . Diabetes Neg Hx      ROS:  Please see the history of present illness.   {ros [id number]}  All other systems reviewed and negative.    Physical Exam:*** There were no vitals taken for this visit. General: Well developed, well nourished female in no acute distress. Head: Normocephalic, atraumatic, sclera non-icteric, no xanthomas, nares are without discharge. EENT: normal  Lymph Nodes:  none Neck: Negative for carotid bruits. JVD not elevated. Back:without scoliosis kyphosis*** Lungs: Clear bilaterally to auscultation without wheezes, rales, or rhonchi. Breathing is unlabored. Heart: RRR with S1 S2. No *** ***/6 systolic*** murmur . No rubs, or gallops appreciated. Abdomen: Soft, non-tender, non-distended  with normoactive bowel sounds. No hepatomegaly. No rebound/guarding. No obvious abdominal masses. Msk:  Strength and tone appear normal for age. Extremities: No clubbing or cyanosis. No*** ***+*** edema.  Distal pedal pulses are 2+ and equal bilaterally. Skin: Warm and Dry Neuro: Alert and oriented X 3. CN III-XII intact Grossly normal sensory  and motor function . Psych:  Responds to questions appropriately with a normal affect.      Labs: Cardiac Enzymes No results for input(s): CKTOTAL, CKMB, TROPONINI in the last 72 hours. CBC Lab Results  Component Value Date   WBC 8.0 12/31/2014   HGB 10.8* 12/31/2014   HCT 31.7* 12/31/2014   MCV 91.2 12/31/2014   PLT 240 12/31/2014   PROTIME: No results for input(s): LABPROT, INR in the last 72 hours. Chemistry No results for input(s): NA, K, CL, CO2, BUN, CREATININE, CALCIUM, PROT, BILITOT, ALKPHOS, ALT, AST, GLUCOSE in the last 168 hours.  Invalid input(s): LABALBU Lipids Lab Results  Component Value Date   CHOL 114 12/16/2014   HDL 34* 12/16/2014   LDLCALC 39 12/16/2014   TRIG 207* 12/16/2014   BNP PRO B NATRIURETIC PEPTIDE (BNP)  Date/Time Value Ref Range Status  01/01/2010 12:15 PM 157.0* 0.0 - 100.0 pg/mL Final   Thyroid Function Tests: No results for input(s): TSH, T4TOTAL, T3FREE, THYROIDAB in the last 72 hours.  Invalid input(s): FREET3 Miscellaneous Lab Results  Component Value Date   DDIMER  01/28/2009    0.30        AT THE INHOUSE ESTABLISHED CUTOFF VALUE OF 0.48 ug/mL FEU, THIS ASSAY HAS BEEN DOCUMENTED IN THE LITERATURE TO HAVE A SENSITIVITY AND NEGATIVE PREDICTIVE VALUE OF AT LEAST 98 TO 99%.  THE TEST RESULT SHOULD BE CORRELATED WITH AN ASSESSMENT OF THE CLINICAL PROBABILITY OF DVT / VTE.    Radiology/Studies:  Dg Abd 1 View  12/30/2014  CLINICAL DATA:  Vomiting, altered mental status, acute renal failure EXAM: ABDOMEN - 1 VIEW COMPARISON:  None. FINDINGS: No abnormally dilated loops of bowel. Air  is seen into the rectum. Right upper quadrant cholecystectomy clips noted. L4-5 posterior laminectomy and fusion noted. No abnormal opacities over the abdomen. IMPRESSION: Pain with no acute abnormalities Electronically Signed   By: Skipper Cliche M.D.   On: 12/30/2014 16:30   Ct Head Wo Contrast  12/27/2014  CLINICAL DATA:  Generalized weakness, 1 week duration. EXAM: CT HEAD WITHOUT CONTRAST TECHNIQUE: Contiguous axial images were obtained from the base of the skull through the vertex without intravenous contrast. COMPARISON:  12/16/2014 CT.  12/18/2014 MRI. FINDINGS: No focal abnormality is seen affecting the brainstem. There are a few old small vessel cerebellar infarctions. There is old infarction in the right temporal lobe consistent with right MCA branch vessel territory infarction. There is an old right posterior frontal cortical and subcortical infarction. There are extensive chronic small vessel ischemic changes throughout the deep white matter. Late subacute infarction in the left basal ganglia was acute on the previous study. No progressive change. No evidence of swelling or hemorrhage. Elsewhere, no evidence of hemorrhage, mass lesion, hydrocephalus or extra-axial collection. No calvarial abnormality. Sinuses, middle ears and mastoids are clear. There is atherosclerotic calcification of the major vessels at the base of the brain. IMPRESSION: No acute insult. Old right temporal lobe and right posterior frontal lobe cortical and subcortical infarctions. Extensive chronic small vessel ischemic changes throughout the brain. Expected evolutionary changes of a late subacute left basal ganglia stroke. No sign of hemorrhage or mass effect. No new stroke. Electronically Signed   By: Nelson Chimes M.D.   On: 12/27/2014 16:08   US Renal  12/27/2014  CLINICAL DATA:  Acute onset of renal insufficiency. Initial encounter. EXAM: RENAL / URINARY TRACT ULTRASOUND COMPLETE COMPARISON:  CT of the abdomen and pelvis  performed 11/30/2012 FINDINGS: Right Kidney: Length: 10.3  cm. Echogenicity within normal limits. No mass or hydronephrosis visualized. Left Kidney: Length: 9.4 cm. Echogenicity within normal limits. No mass or hydronephrosis visualized. Bladder: Appears normal for degree of bladder distention. IMPRESSION: Unremarkable renal ultrasound. Electronically Signed   By: Garald Balding M.D.   On: 12/27/2014 19:08    EKG: ***   Assessment and Plan: *** Virl Axe

## 2015-01-28 NOTE — Patient Outreach (Signed)
[facility]) Care Management  01/28/2015  HENYA AGUALLO 07-06-1928 [id number]   Referral from HTA tier 4 list, assigned Mariann Laster, RN for patient outreach.  Jaelani Posa L. Lilliauna Van, [place] Care Management Assistant

## 2015-02-03 ENCOUNTER — Other Ambulatory Visit: Payer: Self-pay

## 2015-02-03 NOTE — Patient Outreach (Signed)
[facility]) Care Management  02/03/2015  TYSHARA CAZAREZ [birth year] [id number]   Telephone Screen  Referral Date: 01/28/15 Referral Source: HTA Referral Reason: CHF with 4 ED visits and 4 hospital admits  Outreach attempt #1 to patient. [id number] disconnected. RN CM attempted alternate number [phone] answer and unable to leave voicemail message.  Plan: RN CM will attempt outreach call to patient within a week.  Enzo Montgomery, RN,BSN,CCM [place] Management Telephonic Care Management Coordinator Direct Phone: [phone] Toll Free: [phone] Fax: [phone]

## 2015-02-17 ENCOUNTER — Other Ambulatory Visit: Payer: Self-pay

## 2015-02-17 NOTE — Patient Outreach (Signed)
[facility]) Care Management  02/17/2015  MALEY BIBLE November 04, 1928 [id number]  Telephone Screen  Referral Date: 01/28/15 Referral Source: HTA Referral Reason: CHF with 4 ED visits and 4 hospital admits  Outreach attempt #2 to patient. [id number] disconnected.  RN CM attempted alternate number [phone] number disconnected.   Plan: RN CM will attempt outreach call to patient within one week.  Mariann Laster, RN, BSN, [facility], CCM  [employer] Management Coordinator [phone] Direct [phone] Cell [phone] Office [phone] Fax

## 2015-02-25 ENCOUNTER — Other Ambulatory Visit: Payer: Self-pay

## 2015-02-25 NOTE — Patient Outreach (Signed)
[place] [facility]) Care Management  02/25/2015  Amanda Hogan Jan 03, 1929 [id number]   Telephone Screen  Referral Date: 01/28/15 Referral Source: HTA Referral Reason: CHF with 4 ED visits and 4 hospital admits per [place]:  [employer].   Outreach attempt #3 to patient's daughter/Powell,Hazel.   Onalee Hua states patient is in a nursing home:  [facility], [place], [place] for LTC.  States patient will not return back into the home setting.    Plan: [place] Management Assistant notified to close case:  SNF Primary care MD:  Dr. Toy Cookey notified of case closure.  RN CM advised daughter to contact Primary Care MD to make new referral to [facility] should patient need future care coordination services.    Mariann Laster, RN, BSN, [facility], CCM  [employer] Management Coordinator [phone] Direct [phone] Cell [phone] Office [phone] Fax

## 2015-04-09 DIAGNOSIS — Z7689 Persons encountering health services in other specified circumstances: Secondary | ICD-10-CM | POA: Diagnosis not present

## 2015-04-09 DIAGNOSIS — R079 Chest pain, unspecified: Secondary | ICD-10-CM | POA: Diagnosis not present

## 2015-04-16 DIAGNOSIS — M6281 Muscle weakness (generalized): Secondary | ICD-10-CM | POA: Diagnosis not present

## 2015-04-16 DIAGNOSIS — I639 Cerebral infarction, unspecified: Secondary | ICD-10-CM | POA: Diagnosis not present

## 2015-04-16 DIAGNOSIS — M797 Fibromyalgia: Secondary | ICD-10-CM | POA: Diagnosis not present

## 2015-04-16 DIAGNOSIS — I6789 Other cerebrovascular disease: Secondary | ICD-10-CM | POA: Diagnosis not present

## 2015-04-20 DIAGNOSIS — R5383 Other fatigue: Secondary | ICD-10-CM | POA: Diagnosis not present

## 2015-04-20 DIAGNOSIS — N390 Urinary tract infection, site not specified: Secondary | ICD-10-CM | POA: Diagnosis not present

## 2015-04-20 DIAGNOSIS — Z79899 Other long term (current) drug therapy: Secondary | ICD-10-CM | POA: Diagnosis not present

## 2015-04-20 DIAGNOSIS — I10 Essential (primary) hypertension: Secondary | ICD-10-CM | POA: Diagnosis not present

## 2015-04-20 DIAGNOSIS — G309 Alzheimer's disease, unspecified: Secondary | ICD-10-CM | POA: Diagnosis not present

## 2015-04-20 DIAGNOSIS — G2581 Restless legs syndrome: Secondary | ICD-10-CM | POA: Diagnosis not present

## 2015-04-20 DIAGNOSIS — R4182 Altered mental status, unspecified: Secondary | ICD-10-CM | POA: Diagnosis not present

## 2015-04-23 DIAGNOSIS — Z79899 Other long term (current) drug therapy: Secondary | ICD-10-CM | POA: Diagnosis not present

## 2015-04-27 DIAGNOSIS — I10 Essential (primary) hypertension: Secondary | ICD-10-CM | POA: Diagnosis not present

## 2015-04-27 DIAGNOSIS — M542 Cervicalgia: Secondary | ICD-10-CM | POA: Diagnosis not present

## 2015-04-27 DIAGNOSIS — G3184 Mild cognitive impairment, so stated: Secondary | ICD-10-CM | POA: Diagnosis not present

## 2015-04-27 DIAGNOSIS — N189 Chronic kidney disease, unspecified: Secondary | ICD-10-CM | POA: Diagnosis not present

## 2015-04-27 DIAGNOSIS — D500 Iron deficiency anemia secondary to blood loss (chronic): Secondary | ICD-10-CM | POA: Diagnosis not present

## 2015-04-27 DIAGNOSIS — R5381 Other malaise: Secondary | ICD-10-CM | POA: Diagnosis not present

## 2015-04-27 DIAGNOSIS — N390 Urinary tract infection, site not specified: Secondary | ICD-10-CM | POA: Diagnosis not present

## 2015-04-27 DIAGNOSIS — G309 Alzheimer's disease, unspecified: Secondary | ICD-10-CM | POA: Diagnosis not present

## 2015-05-11 DIAGNOSIS — R109 Unspecified abdominal pain: Secondary | ICD-10-CM | POA: Diagnosis not present

## 2015-05-11 DIAGNOSIS — K529 Noninfective gastroenteritis and colitis, unspecified: Secondary | ICD-10-CM | POA: Diagnosis not present

## 2015-05-11 DIAGNOSIS — R609 Edema, unspecified: Secondary | ICD-10-CM | POA: Diagnosis not present

## 2015-05-11 DIAGNOSIS — G2581 Restless legs syndrome: Secondary | ICD-10-CM | POA: Diagnosis not present

## 2015-05-11 DIAGNOSIS — Z79899 Other long term (current) drug therapy: Secondary | ICD-10-CM | POA: Diagnosis not present

## 2015-05-11 DIAGNOSIS — R4182 Altered mental status, unspecified: Secondary | ICD-10-CM | POA: Diagnosis not present

## 2015-05-11 DIAGNOSIS — F05 Delirium due to known physiological condition: Secondary | ICD-10-CM | POA: Diagnosis not present

## 2015-05-11 DIAGNOSIS — M6281 Muscle weakness (generalized): Secondary | ICD-10-CM | POA: Diagnosis not present

## 2015-05-11 DIAGNOSIS — R5381 Other malaise: Secondary | ICD-10-CM | POA: Diagnosis not present

## 2015-05-17 DIAGNOSIS — M6281 Muscle weakness (generalized): Secondary | ICD-10-CM | POA: Diagnosis not present

## 2015-05-17 DIAGNOSIS — I639 Cerebral infarction, unspecified: Secondary | ICD-10-CM | POA: Diagnosis not present

## 2015-05-17 DIAGNOSIS — I6789 Other cerebrovascular disease: Secondary | ICD-10-CM | POA: Diagnosis not present

## 2015-05-17 DIAGNOSIS — M797 Fibromyalgia: Secondary | ICD-10-CM | POA: Diagnosis not present

## 2015-05-18 DIAGNOSIS — I5030 Unspecified diastolic (congestive) heart failure: Secondary | ICD-10-CM | POA: Diagnosis not present

## 2015-05-18 DIAGNOSIS — M436 Torticollis: Secondary | ICD-10-CM | POA: Diagnosis not present

## 2015-05-18 DIAGNOSIS — R197 Diarrhea, unspecified: Secondary | ICD-10-CM | POA: Diagnosis not present

## 2015-05-18 DIAGNOSIS — R609 Edema, unspecified: Secondary | ICD-10-CM | POA: Diagnosis not present

## 2015-05-18 DIAGNOSIS — Z8673 Personal history of transient ischemic attack (TIA), and cerebral infarction without residual deficits: Secondary | ICD-10-CM | POA: Diagnosis not present

## 2015-05-18 DIAGNOSIS — N390 Urinary tract infection, site not specified: Secondary | ICD-10-CM | POA: Diagnosis not present

## 2015-05-18 DIAGNOSIS — R0609 Other forms of dyspnea: Secondary | ICD-10-CM | POA: Diagnosis not present

## 2015-05-18 DIAGNOSIS — M6281 Muscle weakness (generalized): Secondary | ICD-10-CM | POA: Diagnosis not present

## 2015-05-21 ENCOUNTER — Emergency Department
Admission: EM | Admit: 2015-05-21 | Discharge: 2015-05-21 | Disposition: A | Discharge status: Home / Self Care | Payer: PPO | Attending: Emergency Medicine | Admitting: Emergency Medicine

## 2015-05-21 ENCOUNTER — Encounter: Payer: Self-pay | Admitting: Emergency Medicine

## 2015-05-21 ENCOUNTER — Emergency Department: Payer: PPO

## 2015-05-21 DIAGNOSIS — I10 Essential (primary) hypertension: Secondary | ICD-10-CM | POA: Diagnosis not present

## 2015-05-21 DIAGNOSIS — Z7982 Long term (current) use of aspirin: Secondary | ICD-10-CM | POA: Diagnosis not present

## 2015-05-21 DIAGNOSIS — Z792 Long term (current) use of antibiotics: Secondary | ICD-10-CM | POA: Diagnosis not present

## 2015-05-21 DIAGNOSIS — R011 Cardiac murmur, unspecified: Secondary | ICD-10-CM | POA: Diagnosis not present

## 2015-05-21 DIAGNOSIS — R531 Weakness: Secondary | ICD-10-CM | POA: Diagnosis not present

## 2015-05-21 DIAGNOSIS — Z7902 Long term (current) use of antithrombotics/antiplatelets: Secondary | ICD-10-CM | POA: Insufficient documentation

## 2015-05-21 DIAGNOSIS — R51 Headache: Secondary | ICD-10-CM | POA: Diagnosis not present

## 2015-05-21 DIAGNOSIS — M542 Cervicalgia: Secondary | ICD-10-CM | POA: Insufficient documentation

## 2015-05-21 DIAGNOSIS — M6281 Muscle weakness (generalized): Secondary | ICD-10-CM | POA: Diagnosis not present

## 2015-05-21 DIAGNOSIS — Z79899 Other long term (current) drug therapy: Secondary | ICD-10-CM | POA: Insufficient documentation

## 2015-05-21 DIAGNOSIS — M546 Pain in thoracic spine: Secondary | ICD-10-CM | POA: Diagnosis not present

## 2015-05-21 DIAGNOSIS — M549 Dorsalgia, unspecified: Secondary | ICD-10-CM

## 2015-05-21 DIAGNOSIS — R4182 Altered mental status, unspecified: Secondary | ICD-10-CM | POA: Diagnosis not present

## 2015-05-21 LAB — CBC WITH DIFFERENTIAL/PLATELET
BASOS PCT: 1 %
Basophils Absolute: 0 10*3/uL (ref 0–0.1)
Eosinophils Absolute: 0.2 10*3/uL (ref 0–0.7)
Eosinophils Relative: 3 %
HCT: 35.1 % (ref 35.0–47.0)
Hemoglobin: 11.9 g/dL — ABNORMAL LOW (ref 12.0–16.0)
LYMPHS ABS: 1.3 10*3/uL (ref 1.0–3.6)
Lymphocytes Relative: 18 %
MCH: 31.3 pg (ref 26.0–34.0)
MCHC: 33.9 g/dL (ref 32.0–36.0)
MCV: 92.3 fL (ref 80.0–100.0)
MONO ABS: 0.5 10*3/uL (ref 0.2–0.9)
MONOS PCT: 7 %
NEUTROS ABS: 5 10*3/uL (ref 1.4–6.5)
Neutrophils Relative %: 71 %
Platelets: 253 10*3/uL (ref 150–440)
RBC: 3.8 MIL/uL (ref 3.80–5.20)
RDW: 13.8 % (ref 11.5–14.5)
WBC: 7.1 10*3/uL (ref 3.6–11.0)

## 2015-05-21 LAB — COMPREHENSIVE METABOLIC PANEL
ALBUMIN: 4 g/dL (ref 3.5–5.0)
ALK PHOS: 66 U/L (ref 38–126)
ALT: 15 U/L (ref 14–54)
AST: 24 U/L (ref 15–41)
Anion gap: 9 (ref 5–15)
BILIRUBIN TOTAL: 0.8 mg/dL (ref 0.3–1.2)
BUN: 20 mg/dL (ref 6–20)
CALCIUM: 9.4 mg/dL (ref 8.9–10.3)
CO2: 22 mmol/L (ref 22–32)
Chloride: 108 mmol/L (ref 101–111)
Creatinine, Ser: 1.29 mg/dL — ABNORMAL HIGH (ref 0.44–1.00)
GFR calc Af Amer: 43 mL/min — ABNORMAL LOW (ref >60)
GFR calc non Af Amer: 37 mL/min — ABNORMAL LOW (ref >60)
GLUCOSE: 143 mg/dL — AB (ref 65–99)
POTASSIUM: 4.2 mmol/L (ref 3.5–5.1)
Sodium: 139 mmol/L (ref 135–145)
TOTAL PROTEIN: 7.6 g/dL (ref 6.5–8.1)

## 2015-05-21 LAB — URINALYSIS COMPLETE WITH MICROSCOPIC (ARMC ONLY)
BACTERIA UA: NONE SEEN
Bilirubin Urine: NEGATIVE
Glucose, UA: NEGATIVE mg/dL
Hgb urine dipstick: NEGATIVE
Ketones, ur: NEGATIVE mg/dL
Leukocytes, UA: NEGATIVE
NITRITE: NEGATIVE
PH: 5 (ref 5.0–8.0)
PROTEIN: NEGATIVE mg/dL
Specific Gravity, Urine: 1.016 (ref 1.005–1.030)

## 2015-05-21 LAB — TROPONIN I: Troponin I: <0.03 ng/mL (ref <0.031)

## 2015-05-21 MED ORDER — SODIUM CHLORIDE 0.9 % IV BOLUS (SEPSIS)
500.0000 mL | Freq: Once | INTRAVENOUS | Status: AC
  Administered 2015-05-21: 500 mL via INTRAVENOUS

## 2015-05-21 MED ORDER — ACETAMINOPHEN 500 MG PO TABS
1000.0000 mg | ORAL_TABLET | Freq: Once | ORAL | Status: AC
  Administered 2015-05-21: 1000 mg via ORAL
  Filled 2015-05-21: qty 2

## 2015-05-21 NOTE — ED Notes (Signed)
Ambulated to bathroom with assistance   Tolerated well

## 2015-05-21 NOTE — ED Provider Notes (Signed)
[facility] Emergency Department Provider Note  ____________________________________________  Time seen: 9:50 AM  I have reviewed the triage vital signs and the nursing notes.   HISTORY  Chief Complaint Weakness    HPI Amanda Hogan is a 82 y.o. female who comes from nursing facility due to generalized weakness today since waking up.. No report of any new neurologic symptoms such as focal numbness tingling weakness. The patient does complain of some pain in the upper back as well as the back of her neck and back of her head. No pain with movement of the neck. No fevers chills nausea vomiting or vision changes.     Past Medical History  Diagnosis Date  . Hypertension   . Arthritis   . High cholesterol   . Acid reflux   . TIA (transient ischemic attack)   . Restless leg syndrome   . Complication of anesthesia     "w/my back; gave me too much; thought I'd had stroke but didn't"  . Heart murmur   . Stroke [facility])     "said it looked like bullet holes; had a bunch"  . Angina   . Exertional dyspnea   . Sinus malignant neoplasm ([place])   . Fibromyalgia   . Memory disorder, possibly not taking home meds at times 07/22/2011  . Anxiety 02/23/2012  . Dementia 02/23/2012  . Kidney stone   . HTN (hypertension)   . Mild carotid artery disease ([place]) 08/05/11    carotid dopplers, mild disease  . Chest pain     negative lexiscan myoview 53/13, last echo 02/24/12-EF 123456, mid systolic obliteration of the LV, cavity aortic sclerosis     Patient Active Problem List   Diagnosis Date Noted  . Acute kidney injury ([place]) 12/27/2014  . Acute CVA (cerebrovascular accident) ([place]) 12/16/2014  . Diastolic dysfunction-grade 2 10/29/2014  . Pulmonary hypertension-moderate 10/29/2014  . Troponin level elevated-suspect demand ischemia from AF with RVR 10/28/2014  . Lymphoma history 10/28/2014  . Acute diastolic CHF (congestive heart failure) ([place]) 10/27/2014  . Atrial  fibrillation with RVR (new)   . TIA (transient ischemic attack) 02/25/2012  . Encephalopathy acute 02/23/2012  . Anemia 02/23/2012  . Chronic back pain 02/23/2012  . Anxiety 02/23/2012  . Mild carotid artery disease ([place]) 08/05/2011  . Memory disorder, possibly not taking home meds at times 07/22/2011  . Hypertension, at times poorly controlled 07/21/2011  . Chest pain-low risk Myoview May 2013 07/21/2011  . Hyperlipidemia 07/21/2011  . Gastroesophageal reflux disease 07/21/2011  . History of TIA (transient ischemic attack) 07/21/2011  . Obesity 07/21/2011     Past Surgical History  Procedure Laterality Date  . Cholecystectomy  2005  . Vaginal hysterectomy    . Breast cyst excision      left  . Hernia repair  2007    "stomach"  . Lung surgery      "made 3 holes and pulled gel out"  . Knee cartilage surgery      right  . Lumbar spine surgery      "put box in my lower back"  . Elbow bursa surgery      left  . Cataract extraction w/ intraocular lens  implant, bilateral    . Eye surgery  1940    fixed my crossed eyes"  . Kidney stone surgery      "right side; cut it out"  . Carpal tunnel release      right     Current Outpatient  Rx  Name  Route  Sig  Dispense  Refill  . acetaminophen (TYLENOL) 500 MG tablet   Oral   Take 1,000 mg by mouth every 8 (eight) hours.          . ALPRAZolam (XANAX) 0.25 MG tablet   Oral   Take 1 tablet (0.25 mg total) by mouth daily as needed for anxiety.   30 tablet   0   . amLODipine (NORVASC) 10 MG tablet   Oral   Take 10 mg by mouth daily.         Marland Kitchen aspirin (ASPIRIN CHILDRENS) 81 MG chewable tablet   Oral   Chew 81 mg by mouth every morning.         Marland Kitchen atorvastatin (LIPITOR) 40 MG tablet   Oral   Take 1 tablet by mouth daily.         Marland Kitchen atorvastatin (LIPITOR) 80 MG tablet   Oral   Take 40 mg by mouth daily. Take one tablet by mouth once daily for cholesterol         . ciprofloxacin (CIPRO) 250 MG tablet   Oral    Take 250 mg by mouth 2 (two) times daily.         . clopidogrel (PLAVIX) 75 MG tablet   Oral   Take 75 mg by mouth daily.         Marland Kitchen gabapentin (NEURONTIN) 300 MG capsule   Oral   Take 300 mg by mouth at bedtime.         . memantine (NAMENDA XR) 28 MG CP24 24 hr capsule   Oral   Take 28 mg by mouth daily.         . metoprolol succinate (TOPROL-XL) 50 MG 24 hr tablet   Oral   Take 1 tablet (50 mg total) by mouth daily. Take with or immediately following a meal.   30 tablet   0   . pantoprazole (PROTONIX) 40 MG tablet   Oral   Take 40 mg by mouth 2 (two) times daily.          . QUEtiapine (SEROQUEL) 25 MG tablet   Oral   Take 1 tablet (25 mg total) by mouth at bedtime. Patient taking differently: Take 25 mg by mouth every evening.          Marland Kitchen rOPINIRole (REQUIP) 0.5 MG tablet   Oral   Take 0.5 mg by mouth at bedtime.         Marland Kitchen rOPINIRole (REQUIP) 2 MG tablet   Oral   Take 2 mg by mouth 3 (three) times daily.          Marland Kitchen spironolactone (ALDACTONE) 25 MG tablet   Oral   Take 25 mg by mouth daily.         . nitroGLYCERIN (NITROSTAT) 0.4 MG SL tablet   Sublingual   Place 0.4 mg under the tongue every 5 (five) minutes as needed for chest pain.          Marland Kitchen UNABLE TO FIND      Med Name: Med Pass 240 cc by mouth three times daily with meals            Allergies Iohexol; Codeine sulfate; and Contrast media   Family History  Problem Relation Age of Onset  . Diabetes Neg Hx     Social History Social History  Substance Use Topics  . Smoking status: Never Smoker   . Smokeless tobacco: Never Used  .  Alcohol Use: No    Review of Systems  Constitutional:   No fever or chills. No weight changes Eyes:   No blurry vision or double vision.  ENT:   No sore throat.  Cardiovascular:   No chest pain. Respiratory:   No dyspnea or cough. Gastrointestinal:   Negative for abdominal pain, vomiting and diarrhea.  No BRBPR or melena. Genitourinary:    Negative for dysuria or difficulty urinating. Musculoskeletal:   Positive upper back pain and neck pain. Skin:   Negative for rash. Neurological:   Positive occipital headache without focal weakness or numbness. Headache is gradual onset, nonradiating. Moderate intensity Psychiatric:  No anxiety or depression.   Endocrine:  Decreased energy.  10-point ROS otherwise negative.  ____________________________________________   PHYSICAL EXAM:  VITAL SIGNS: ED Triage Vitals  Enc Vitals Group     BP 05/21/15 1126 126/58 mmHg     Pulse Rate 05/21/15 1126 70     Resp 05/21/15 1126 11     Temp 05/21/15 1001 97.9 F (36.6 C)     Temp Source 05/21/15 1001 Oral     SpO2 05/21/15 1126 96 %     Weight 05/21/15 1003 189 lb (85.73 kg)     Height 05/21/15 1003 5\' 4"  ([id number] m)     Head Cir --      Peak Flow --      Pain Score 05/21/15 1126 2     Pain Loc --      Pain Edu? --      Excl. in [place]? --     Vital signs reviewed, nursing assessments reviewed.   Constitutional:   Alert and oriented. Well appearing and in no distress. Eyes:   No scleral icterus. No conjunctival pallor. PERRL. EOMI ENT   Head:   Normocephalic and atraumatic.   Nose:   No congestion/rhinnorhea. No septal hematoma   Mouth/Throat:   Dry mucous membranes, no pharyngeal erythema. No peritonsillar mass.    Neck:   No stridor. No SubQ emphysema. No meningismus. Hematological/Lymphatic/Immunilogical:   No cervical lymphadenopathy. Cardiovascular:   RRR. Symmetric bilateral radial and DP pulses.  Systolic murmur in her right upper sternum, nonradiating.  Respiratory:   Normal respiratory effort without tachypnea nor retractions. Breath sounds are clear and equal bilaterally. No wheezes/rales/rhonchi. Gastrointestinal:   Soft and nontender. Non distended. There is no CVA tenderness.  No rebound, rigidity, or guarding. Genitourinary:   deferred Musculoskeletal:   Nontender with normal range of motion in all  extremities. No joint effusions.  No lower extremity tenderness.  No edema. There is some tenderness just medial to the scapula on the right upper back, no midline spinal tenderness. No C-spine tenderness, full range of motion in the neck. Neurologic:   Normal speech and language.  CN 2-10 normal. Motor grossly intact. Ambulatory with assistance No gross focal neurologic deficits are appreciated.  Skin:    Skin is warm, dry and intact. No rash noted.  No petechiae, purpura, or bullae. Psychiatric:   Mood and affect are normal. ____________________________________________    LABS (pertinent positives/negatives) (all labs ordered are listed, but only abnormal results are displayed) Labs Reviewed  COMPREHENSIVE METABOLIC PANEL - Abnormal; Notable for the following:    Glucose, Bld 143 (*)    Creatinine, Ser 1.29 (*)    GFR calc non Af Amer 37 (*)    GFR calc Af Amer 43 (*)    All other components within normal limits  CBC WITH DIFFERENTIAL/PLATELET - Abnormal; Notable  for the following:    Hemoglobin 11.9 (*)    All other components within normal limits  URINALYSIS COMPLETEWITH MICROSCOPIC ([facility] ONLY) - Abnormal; Notable for the following:    Color, Urine YELLOW (*)    APPearance CLEAR (*)    Squamous Epithelial / LPF 0-5 (*)    All other components within normal limits  URINE CULTURE  TROPONIN I   ____________________________________________   EKG  Interpreted by me  Date: 05/21/2015  Rate: 64  Rhythm: normal sinus rhythm  QRS Axis: normal  Intervals: normal  ST/T Wave abnormalities: normal  Conduction Disutrbances: none  Narrative Interpretation: unremarkable      ____________________________________________    RADIOLOGY  CT head unremarkable Chest x-ray unremarkable  ____________________________________________   PROCEDURES   ____________________________________________   INITIAL IMPRESSION / ASSESSMENT AND PLAN / ED COURSE  Pertinent labs &  imaging results that were available during my care of the patient were reviewed by me and considered in my medical decision making (see chart for details).  Patient resents with generalized weakness. Some musculoskeletal complaints as well. Low suspicion for any significant cardiovascular or pulmonary pathology. Low suspicion for PE TAD or ACS. No evidence of meningitis encephalitis or seen a soft tissue infection. Low suspicion for sepsis. No evidence of any acute stroke or intracranial hemorrhage. Labs are all unremarkable. We'll discharge the patient home to follow up closely with primary care for monitoring of her symptoms. Tylenol for her headache.     ____________________________________________   FINAL CLINICAL IMPRESSION(S) / ED DIAGNOSES  Final diagnoses:  Generalized weakness  Upper back pain on right side      Carrie Mew, MD 05/21/15 1315

## 2015-05-21 NOTE — ED Notes (Signed)
Brought in via ems from [place]  Per EMS the staff she c/o'd of some weakness this am. On arrival to ED she is having some discomfort to neck /upper back  denies any injury

## 2015-05-21 NOTE — Discharge Instructions (Signed)
Back Pain, Adult °Back pain is very common in adults. The cause of back pain is rarely dangerous and the pain often gets better over time. The cause of your back pain may not be known. Some common causes of back pain include: °· Strain of the muscles or ligaments supporting the spine. °· Wear and tear (degeneration) of the spinal disks. °· Arthritis. °· Direct injury to the back. °For many people, back pain may return. Since back pain is rarely dangerous, most people can learn to manage this condition on their own. °HOME CARE INSTRUCTIONS °Watch your back pain for any changes. The following actions may help to lessen any discomfort you are feeling: °· Remain active. It is stressful on your back to sit or stand in one place for long periods of time. Do not sit, drive, or stand in one place for more than 30 minutes at a time. Take short walks on even surfaces as soon as you are able. Try to increase the length of time you walk each day. °· Exercise regularly as directed by your health care provider. Exercise helps your back heal faster. It also helps avoid future injury by keeping your muscles strong and flexible. °· Do not stay in bed. Resting more than 1-2 days can delay your recovery. °· Pay attention to your body when you bend and lift. The most comfortable positions are those that put less stress on your recovering back. Always use proper lifting techniques, including: °¨ Bending your knees. °¨ Keeping the load close to your body. °¨ Avoiding twisting. °· Find a comfortable position to sleep. Use a firm mattress and lie on your side with your knees slightly bent. If you lie on your back, put a pillow under your knees. °· Avoid feeling anxious or stressed. Stress increases muscle tension and can worsen back pain. It is important to recognize when you are anxious or stressed and learn ways to manage it, such as with exercise. °· Take medicines only as directed by your health care provider. Over-the-counter  medicines to reduce pain and inflammation are often the most helpful. Your health care provider may prescribe muscle relaxant drugs. These medicines help dull your pain so you can more quickly return to your normal activities and healthy exercise. °· Apply ice to the injured area: °¨ Put ice in a plastic bag. °¨ Place a towel between your skin and the bag. °¨ Leave the ice on for 20 minutes, 2-3 times a day for the first 2-3 days. After that, ice and heat may be alternated to reduce pain and spasms. °· Maintain a healthy weight. Excess weight puts extra stress on your back and makes it difficult to maintain good posture. °SEEK MEDICAL CARE IF: °· You have pain that is not relieved with rest or medicine. °· You have increasing pain going down into the legs or buttocks. °· You have pain that does not improve in one week. °· You have night pain. °· You lose weight. °· You have a fever or chills. °SEEK IMMEDIATE MEDICAL CARE IF:  °· You develop new bowel or bladder control problems. °· You have unusual weakness or numbness in your arms or legs. °· You develop nausea or vomiting. °· You develop abdominal pain. °· You feel faint. °  °This information is not intended to replace advice given to you by your health care provider. Make sure you discuss any questions you have with your health care provider. °  °Document Released: 03/07/2005 Document Revised: 03/28/2014 Document Reviewed: 07/09/2013 °Elsevier Interactive Patient Education ©2016 Elsevier   Inc.  Fatigue Fatigue is feeling tired all of the time, a lack of energy, or a lack of motivation. Occasional or mild fatigue is often a normal response to activity or life in general. However, long-lasting (chronic) or extreme fatigue may indicate an underlying medical condition. HOME CARE INSTRUCTIONS  Watch your fatigue for any changes. The following actions may help to lessen any discomfort you are feeling:  Talk to your health care provider about how much sleep you  need each night. Try to get the required amount every night.  Take medicines only as directed by your health care provider.  Eat a healthy and nutritious diet. Ask your health care provider if you need help changing your diet.  Drink enough fluid to keep your urine clear or pale yellow.  Practice ways of relaxing, such as yoga, meditation, massage therapy, or acupuncture.  Exercise regularly.   Change situations that cause you stress. Try to keep your work and personal routine reasonable.  Do not abuse illegal drugs.  Limit alcohol intake to no more than 1 drink per day for nonpregnant women and 2 drinks per day for men. One drink equals 12 ounces of beer, 5 ounces of wine, or 1 ounces of hard liquor.  Take a multivitamin, if directed by your health care provider. SEEK MEDICAL CARE IF:   Your fatigue does not get better.  You have a fever.   You have unintentional weight loss or gain.  You have headaches.   You have difficulty:   Falling asleep.  Sleeping throughout the night.  You feel angry, guilty, anxious, or sad.   You are unable to have a bowel movement (constipation).   You skin is dry.   Your legs or another part of your body is swollen.  SEEK IMMEDIATE MEDICAL CARE IF:   You feel confused.   Your vision is blurry.  You feel faint or pass out.   You have a severe headache.   You have severe abdominal, pelvic, or back pain.   You have chest pain, shortness of breath, or an irregular or fast heartbeat.   You are unable to urinate or you urinate less than normal.   You develop abnormal bleeding, such as bleeding from the rectum, vagina, nose, lungs, or nipples.  You vomit blood.   You have thoughts about harming yourself or committing suicide.   You are worried that you might harm someone else.    This information is not intended to replace advice given to you by your health care provider. Make sure you discuss any questions  you have with your health care provider.   Document Released: 01/02/2007 Document Revised: 03/28/2014 Document Reviewed: 07/09/2013 Elsevier Interactive Patient Education [employer].

## 2015-05-21 NOTE — ED Notes (Signed)
Generalized weakness this am   Presents via ems from [place]

## 2015-05-23 LAB — URINE CULTURE: Culture: NO GROWTH

## 2015-05-25 DIAGNOSIS — R197 Diarrhea, unspecified: Secondary | ICD-10-CM | POA: Diagnosis not present

## 2015-05-25 DIAGNOSIS — M6281 Muscle weakness (generalized): Secondary | ICD-10-CM | POA: Diagnosis not present

## 2015-05-25 DIAGNOSIS — R609 Edema, unspecified: Secondary | ICD-10-CM | POA: Diagnosis not present

## 2015-05-25 DIAGNOSIS — G459 Transient cerebral ischemic attack, unspecified: Secondary | ICD-10-CM | POA: Diagnosis not present

## 2015-05-25 DIAGNOSIS — N390 Urinary tract infection, site not specified: Secondary | ICD-10-CM | POA: Diagnosis not present

## 2015-05-25 DIAGNOSIS — F0150 Vascular dementia without behavioral disturbance: Secondary | ICD-10-CM | POA: Diagnosis not present

## 2015-05-25 DIAGNOSIS — I6930 Unspecified sequelae of cerebral infarction: Secondary | ICD-10-CM | POA: Diagnosis not present

## 2015-06-01 DIAGNOSIS — G459 Transient cerebral ischemic attack, unspecified: Secondary | ICD-10-CM | POA: Diagnosis not present

## 2015-06-01 DIAGNOSIS — F329 Major depressive disorder, single episode, unspecified: Secondary | ICD-10-CM | POA: Diagnosis not present

## 2015-06-01 DIAGNOSIS — I639 Cerebral infarction, unspecified: Secondary | ICD-10-CM | POA: Diagnosis not present

## 2015-06-01 DIAGNOSIS — F419 Anxiety disorder, unspecified: Secondary | ICD-10-CM | POA: Diagnosis not present

## 2015-06-01 DIAGNOSIS — R609 Edema, unspecified: Secondary | ICD-10-CM | POA: Diagnosis not present

## 2015-06-01 DIAGNOSIS — M6281 Muscle weakness (generalized): Secondary | ICD-10-CM | POA: Diagnosis not present

## 2015-06-04 DIAGNOSIS — R269 Unspecified abnormalities of gait and mobility: Secondary | ICD-10-CM | POA: Diagnosis not present

## 2015-06-04 DIAGNOSIS — I5032 Chronic diastolic (congestive) heart failure: Secondary | ICD-10-CM | POA: Diagnosis not present

## 2015-06-04 DIAGNOSIS — I4891 Unspecified atrial fibrillation: Secondary | ICD-10-CM | POA: Diagnosis not present

## 2015-06-04 DIAGNOSIS — M797 Fibromyalgia: Secondary | ICD-10-CM | POA: Diagnosis not present

## 2015-06-04 DIAGNOSIS — M1990 Unspecified osteoarthritis, unspecified site: Secondary | ICD-10-CM | POA: Diagnosis not present

## 2015-06-04 DIAGNOSIS — R296 Repeated falls: Secondary | ICD-10-CM | POA: Diagnosis not present

## 2015-06-12 DIAGNOSIS — I4891 Unspecified atrial fibrillation: Secondary | ICD-10-CM | POA: Diagnosis not present

## 2015-06-12 DIAGNOSIS — I5022 Chronic systolic (congestive) heart failure: Secondary | ICD-10-CM | POA: Diagnosis not present

## 2015-06-12 DIAGNOSIS — I480 Paroxysmal atrial fibrillation: Secondary | ICD-10-CM | POA: Diagnosis not present

## 2015-06-12 DIAGNOSIS — R600 Localized edema: Secondary | ICD-10-CM | POA: Diagnosis not present

## 2015-06-12 DIAGNOSIS — I10 Essential (primary) hypertension: Secondary | ICD-10-CM | POA: Diagnosis not present

## 2015-06-14 DIAGNOSIS — I6789 Other cerebrovascular disease: Secondary | ICD-10-CM | POA: Diagnosis not present

## 2015-06-14 DIAGNOSIS — M6281 Muscle weakness (generalized): Secondary | ICD-10-CM | POA: Diagnosis not present

## 2015-06-14 DIAGNOSIS — I639 Cerebral infarction, unspecified: Secondary | ICD-10-CM | POA: Diagnosis not present

## 2015-06-14 DIAGNOSIS — M797 Fibromyalgia: Secondary | ICD-10-CM | POA: Diagnosis not present

## 2015-06-19 ENCOUNTER — Emergency Department: Payer: PPO

## 2015-06-19 ENCOUNTER — Emergency Department
Admission: EM | Admit: 2015-06-19 | Discharge: 2015-06-19 | Disposition: A | Discharge status: Home / Self Care | Payer: PPO | Attending: Student | Admitting: Student

## 2015-06-19 DIAGNOSIS — Z7982 Long term (current) use of aspirin: Secondary | ICD-10-CM | POA: Diagnosis not present

## 2015-06-19 DIAGNOSIS — Z79899 Other long term (current) drug therapy: Secondary | ICD-10-CM | POA: Diagnosis not present

## 2015-06-19 DIAGNOSIS — S99922A Unspecified injury of left foot, initial encounter: Secondary | ICD-10-CM | POA: Insufficient documentation

## 2015-06-19 DIAGNOSIS — S161XXA Strain of muscle, fascia and tendon at neck level, initial encounter: Secondary | ICD-10-CM | POA: Diagnosis not present

## 2015-06-19 DIAGNOSIS — W01198A Fall on same level from slipping, tripping and stumbling with subsequent striking against other object, initial encounter: Secondary | ICD-10-CM | POA: Diagnosis not present

## 2015-06-19 DIAGNOSIS — S3992XA Unspecified injury of lower back, initial encounter: Secondary | ICD-10-CM | POA: Insufficient documentation

## 2015-06-19 DIAGNOSIS — Z792 Long term (current) use of antibiotics: Secondary | ICD-10-CM | POA: Insufficient documentation

## 2015-06-19 DIAGNOSIS — I10 Essential (primary) hypertension: Secondary | ICD-10-CM | POA: Insufficient documentation

## 2015-06-19 DIAGNOSIS — M542 Cervicalgia: Secondary | ICD-10-CM | POA: Diagnosis not present

## 2015-06-19 DIAGNOSIS — S098XXA Other specified injuries of head, initial encounter: Secondary | ICD-10-CM | POA: Diagnosis not present

## 2015-06-19 DIAGNOSIS — Y9301 Activity, walking, marching and hiking: Secondary | ICD-10-CM | POA: Insufficient documentation

## 2015-06-19 DIAGNOSIS — M6281 Muscle weakness (generalized): Secondary | ICD-10-CM | POA: Diagnosis not present

## 2015-06-19 DIAGNOSIS — Z7409 Other reduced mobility: Secondary | ICD-10-CM | POA: Diagnosis not present

## 2015-06-19 DIAGNOSIS — W19XXXA Unspecified fall, initial encounter: Secondary | ICD-10-CM | POA: Diagnosis not present

## 2015-06-19 DIAGNOSIS — Y9289 Other specified places as the place of occurrence of the external cause: Secondary | ICD-10-CM | POA: Diagnosis not present

## 2015-06-19 DIAGNOSIS — S4992XA Unspecified injury of left shoulder and upper arm, initial encounter: Secondary | ICD-10-CM | POA: Insufficient documentation

## 2015-06-19 DIAGNOSIS — Y998 Other external cause status: Secondary | ICD-10-CM | POA: Diagnosis not present

## 2015-06-19 DIAGNOSIS — R51 Headache: Secondary | ICD-10-CM | POA: Diagnosis not present

## 2015-06-19 DIAGNOSIS — S0990XA Unspecified injury of head, initial encounter: Secondary | ICD-10-CM | POA: Diagnosis not present

## 2015-06-19 DIAGNOSIS — M79672 Pain in left foot: Secondary | ICD-10-CM | POA: Diagnosis not present

## 2015-06-19 DIAGNOSIS — Z7902 Long term (current) use of antithrombotics/antiplatelets: Secondary | ICD-10-CM | POA: Insufficient documentation

## 2015-06-19 DIAGNOSIS — M545 Low back pain: Secondary | ICD-10-CM | POA: Diagnosis not present

## 2015-06-19 DIAGNOSIS — S199XXA Unspecified injury of neck, initial encounter: Secondary | ICD-10-CM | POA: Diagnosis not present

## 2015-06-19 MED ORDER — ACETAMINOPHEN 500 MG PO TABS
1000.0000 mg | ORAL_TABLET | Freq: Once | ORAL | Status: AC
  Administered 2015-06-19: 1000 mg via ORAL
  Filled 2015-06-19: qty 2

## 2015-06-19 NOTE — ED Notes (Addendum)
Pt from [facility]; EMS reports pt getting up to BR tonight, fell hitting head on refrigerator; denies LOC or dizziness; used life alert to call for assistance; pt c/o back to back of head but no injuries noted, also st mid back and neck pain; EMS st no cervical or spinal tenderness with palpation; pt able to ambulate from EMS stretcher to room stretcher with stand-by assist only

## 2015-06-19 NOTE — ED Notes (Signed)
Assisted patient to use restroom. Moderate assist needed. Patient complaining of neck pain with movement. RN made aware.

## 2015-06-19 NOTE — ED Notes (Signed)
Report given to don at [facility]. Amanda Hogan said to transport pt via EMS

## 2015-06-19 NOTE — ED Provider Notes (Addendum)
[facility] Emergency Department Provider Note  ____________________________________________  Time seen: Approximately 7:16 AM  I have reviewed the triage vital signs and the nursing notes.   HISTORY  Chief Complaint No chief complaint on file.    HPI Amanda Hogan is a 82 y.o. female with history of hypertension, hyperlipidemia, A. fib on Plavix who presents for evaluation of pain in the head, left neck, back and left foot after mechanical fall which occurred earlier this morning, pain has been constant since onset, is moderate and is worse with movement. The patient reports that she got up earlier this morning to use the bathroom. She attempted to walk to the bathroom without her walker and she lost her balance and fell. She reports she laid on the floor for about an hour before she realized that she could use her med alert necklace. She pressed the button and EMS came to her home. She did not lose consciousness. No chest pain or difficulty breathing. No recent illness including no vomiting, diarrhea, fevers or chills.   Past Medical History  Diagnosis Date  . Hypertension   . Arthritis   . High cholesterol   . Acid reflux   . TIA (transient ischemic attack)   . Restless leg syndrome   . Complication of anesthesia     "w/my back; gave me too much; thought I'd had stroke but didn't"  . Heart murmur   . Stroke [facility])     "said it looked like bullet holes; had a bunch"  . Angina   . Exertional dyspnea   . Sinus malignant neoplasm ([place])   . Fibromyalgia   . Memory disorder, possibly not taking home meds at times 07/22/2011  . Anxiety 02/23/2012  . Dementia 02/23/2012  . Kidney stone   . HTN (hypertension)   . Mild carotid artery disease ([place]) 08/05/11    carotid dopplers, mild disease  . Chest pain     negative lexiscan myoview 53/13, last echo 02/24/12-EF [id number], mid systolic obliteration of the LV, cavity aortic sclerosis    Patient Active Problem  List   Diagnosis Date Noted  . Acute kidney injury ([place]) 12/27/2014  . Acute CVA (cerebrovascular accident) ([place]) 12/16/2014  . Diastolic dysfunction-grade 2 10/29/2014  . Pulmonary hypertension-moderate 10/29/2014  . Troponin level elevated-suspect demand ischemia from AF with RVR 10/28/2014  . Lymphoma history 10/28/2014  . Acute diastolic CHF (congestive heart failure) ([place]) 10/27/2014  . Atrial fibrillation with RVR (new)   . TIA (transient ischemic attack) 02/25/2012  . Encephalopathy acute 02/23/2012  . Anemia 02/23/2012  . Chronic back pain 02/23/2012  . Anxiety 02/23/2012  . Mild carotid artery disease ([place]) 08/05/2011  . Memory disorder, possibly not taking home meds at times 07/22/2011  . Hypertension, at times poorly controlled 07/21/2011  . Chest pain-low risk Myoview May 2013 07/21/2011  . Hyperlipidemia 07/21/2011  . Gastroesophageal reflux disease 07/21/2011  . History of TIA (transient ischemic attack) 07/21/2011  . Obesity 07/21/2011    Past Surgical History  Procedure Laterality Date  . Cholecystectomy  2005  . Vaginal hysterectomy    . Breast cyst excision      left  . Hernia repair  2007    "stomach"  . Lung surgery      "made 3 holes and pulled gel out"  . Knee cartilage surgery      right  . Lumbar spine surgery      "put box in my lower back"  . Elbow  bursa surgery      left  . Cataract extraction w/ intraocular lens  implant, bilateral    . Eye surgery  1940    fixed my crossed eyes"  . Kidney stone surgery      "right side; cut it out"  . Carpal tunnel release      right    Current Outpatient Rx  Name  Route  Sig  Dispense  Refill  . acetaminophen (TYLENOL) 500 MG tablet   Oral   Take 1,000 mg by mouth every 8 (eight) hours.          . ALPRAZolam (XANAX) 0.25 MG tablet   Oral   Take 1 tablet (0.25 mg total) by mouth daily as needed for anxiety.   30 tablet   0   . amLODipine (NORVASC) 10 MG tablet   Oral   Take 10 mg by mouth  daily.         Marland Kitchen aspirin (ASPIRIN CHILDRENS) 81 MG chewable tablet   Oral   Chew 81 mg by mouth every morning.         Marland Kitchen atorvastatin (LIPITOR) 40 MG tablet   Oral   Take 1 tablet by mouth daily.         Marland Kitchen atorvastatin (LIPITOR) 80 MG tablet   Oral   Take 40 mg by mouth daily. Take one tablet by mouth once daily for cholesterol         . ciprofloxacin (CIPRO) 250 MG tablet   Oral   Take 250 mg by mouth 2 (two) times daily.         . clopidogrel (PLAVIX) 75 MG tablet   Oral   Take 75 mg by mouth daily.         Marland Kitchen gabapentin (NEURONTIN) 300 MG capsule   Oral   Take 300 mg by mouth at bedtime.         . memantine (NAMENDA XR) 28 MG CP24 24 hr capsule   Oral   Take 28 mg by mouth daily.         . metoprolol succinate (TOPROL-XL) 50 MG 24 hr tablet   Oral   Take 1 tablet (50 mg total) by mouth daily. Take with or immediately following a meal.   30 tablet   0   . nitroGLYCERIN (NITROSTAT) 0.4 MG SL tablet   Sublingual   Place 0.4 mg under the tongue every 5 (five) minutes as needed for chest pain.          . pantoprazole (PROTONIX) 40 MG tablet   Oral   Take 40 mg by mouth 2 (two) times daily.          . QUEtiapine (SEROQUEL) 25 MG tablet   Oral   Take 1 tablet (25 mg total) by mouth at bedtime. Patient taking differently: Take 25 mg by mouth every evening.          Marland Kitchen rOPINIRole (REQUIP) 0.5 MG tablet   Oral   Take 0.5 mg by mouth at bedtime.         Marland Kitchen rOPINIRole (REQUIP) 2 MG tablet   Oral   Take 2 mg by mouth 3 (three) times daily.          Marland Kitchen spironolactone (ALDACTONE) 25 MG tablet   Oral   Take 25 mg by mouth daily.         Marland Kitchen UNABLE TO FIND      Med Name: Med Pass 240 cc by mouth  three times daily with meals           Allergies Iohexol; Codeine sulfate; and Contrast media  Family History  Problem Relation Age of Onset  . Diabetes Neg Hx     Social History Social History  Substance Use Topics  . Smoking status:  Never Smoker   . Smokeless tobacco: Never Used  . Alcohol Use: No    Review of Systems Constitutional: No fever/chills Eyes: No visual changes. ENT: No sore throat. Cardiovascular: Denies chest pain. Respiratory: Denies shortness of breath. Gastrointestinal: No abdominal pain.  No nausea, no vomiting.  No diarrhea.  No constipation. Genitourinary: Negative for dysuria. Musculoskeletal: Negative for back pain. Skin: Negative for rash. Neurological: Positive for headaches, no focal weakness or numbness.  10-point ROS otherwise negative.  ____________________________________________   PHYSICAL EXAM:  VITAL SIGNS: ED Triage Vitals  Enc Vitals Group     BP 06/19/15 0600 158/63 mmHg     Pulse Rate 06/19/15 0600 73     Resp 06/19/15 0600 18     Temp 06/19/15 0600 97.8 F (36.6 C)     Temp Source 06/19/15 0600 Oral     SpO2 06/19/15 0600 100 %     Weight 06/19/15 0600 189 lb (85.73 kg)     Height 06/19/15 0600 5\' 4"  (1.626 m)     Head Cir --      Peak Flow --      Pain Score --      Pain Loc --      Pain Edu? --      Excl. in [place]? --     Constitutional: Alert and oriented. Well appearing and in no acute distress. Eyes: Conjunctivae are normal. PERRL. EOMI. Head: Atraumatic. Nose: No congestion/rhinnorhea. Mouth/Throat: Mucous membranes are moist.  Oropharynx non-erythematous. Neck: No stridor. No cervical spine tenderness to palpation. Tenderness throughout the left paraspinal muscles of the C-spine as well as the left trapezius muscles. Cardiovascular: Normal rate, regular rhythm. Grossly normal heart sounds.  Good peripheral circulation. Respiratory: Normal respiratory effort.  No retractions. Lungs CTAB. Gastrointestinal: Soft and nontender. No distention.  No CVA tenderness. Genitourinary: deferred Musculoskeletal: No lower extremity tenderness nor edema.  No joint effusions. No midline T-spine or L-spine tenderness to palpation. Pelvis is stable to rock and  compression, full active painless range of motion of bilateral hip joints. Mild tenderness to palpation in the lateral aspect of the dorsum of the left foot. Neurologic:  Normal speech and language. No gross focal neurologic deficits are appreciated. 5 out of 5 strength bilateral upper and lower extremities, sensation intact to light touch throughout. Skin:  Skin is warm, dry and intact. No rash noted. Psychiatric: Mood and affect are normal. Speech and behavior are normal.  ____________________________________________   LABS (all labs ordered are listed, but only abnormal results are displayed)  Labs Reviewed - No data to display ____________________________________________  EKG  none ____________________________________________  RADIOLOGY  CT head and c-spine IMPRESSION: 1. No evidence of traumatic intracranial injury or fracture. 2. No evidence of acute fracture or subluxation along the cervical spine. 3. Mild to moderate cortical volume loss and scattered small vessel ischemic microangiopathy. 4. Chronic infarct at the right temporal, frontal and parietal lobes. Chronic lacunar infarct at the left basal ganglia. 5. Mild diffuse degenerative change along the cervical spine.  Xray lumbar spine IMPRESSION: 1. No evidence of fracture or subluxation along the lumbar spine. 2. Status post lumbar spinal fusion at L4-L5. Mild degenerative change at  the upper lumbar spine.  Xray left foot IMPRESSION: Old trauma medial malleolus. Prior bunionectomy with postoperative defect. Narrowing multiple distal joints. No acute fracture or dislocation. Posterior calcaneal spur present. ____________________________________________   PROCEDURES  Procedure(s) performed: None  Critical Care performed: No  ____________________________________________   INITIAL IMPRESSION / ASSESSMENT AND PLAN / ED COURSE  Pertinent labs & imaging results that were available during my care of the  patient were reviewed by me and considered in my medical decision making (see chart for details).  RAZIAH GRELLE is a 82 y.o. female with history of hypertension, hyperlipidemia, A. fib on Plavix who presents for evaluation of pain in the head, left neck, back and left foot after mechanical fall which occurred earlier this morning. On exam, she is very well-appearing and in no acute distress. Vital signs are stable, she is afebrile. She is alert and oriented 4. CT head, C-spine and plain films of the lumbar spine are negative for any acute traumatic pathology. Suspect strain/muscular skeletal pain in the left neck. She is also complaining of pain in the left foot which we will x-ray to rule out fracture to have lower suspicion for this given that she is able to walk well with some assistance which is her baseline (typically she ambulates with a walker). We'll treat her pain and anticipate discharge.  ----------------------------------------- 8:25 AM on 06/19/2015 ----------------------------------------- X-rays negative for any acute traumatic pathology in the left foot. Will treat as contusion. Pain improving. We'll discharge with return precautions and close PCP follow-up. Patient is comfortable with the discharge plan. ____________________________________________   FINAL CLINICAL IMPRESSION(S) / ED DIAGNOSES  Final diagnoses:  Fall, initial encounter  Closed head injury, initial encounter  Neck strain, initial encounter      Joanne Gavel, MD 06/19/15 [id number]  Joanne Gavel, MD 06/19/15 [phone]

## 2015-06-22 DIAGNOSIS — M797 Fibromyalgia: Secondary | ICD-10-CM | POA: Diagnosis not present

## 2015-06-22 DIAGNOSIS — I4891 Unspecified atrial fibrillation: Secondary | ICD-10-CM | POA: Diagnosis not present

## 2015-06-22 DIAGNOSIS — M1990 Unspecified osteoarthritis, unspecified site: Secondary | ICD-10-CM | POA: Diagnosis not present

## 2015-06-22 DIAGNOSIS — R269 Unspecified abnormalities of gait and mobility: Secondary | ICD-10-CM | POA: Diagnosis not present

## 2015-06-22 DIAGNOSIS — M542 Cervicalgia: Secondary | ICD-10-CM | POA: Diagnosis not present

## 2015-06-22 DIAGNOSIS — I5032 Chronic diastolic (congestive) heart failure: Secondary | ICD-10-CM | POA: Diagnosis not present

## 2015-06-22 DIAGNOSIS — M79672 Pain in left foot: Secondary | ICD-10-CM | POA: Diagnosis not present

## 2015-06-22 DIAGNOSIS — M6281 Muscle weakness (generalized): Secondary | ICD-10-CM | POA: Diagnosis not present

## 2015-06-22 DIAGNOSIS — Z9181 History of falling: Secondary | ICD-10-CM | POA: Diagnosis not present

## 2015-06-22 DIAGNOSIS — I10 Essential (primary) hypertension: Secondary | ICD-10-CM | POA: Diagnosis not present

## 2015-06-22 DIAGNOSIS — R609 Edema, unspecified: Secondary | ICD-10-CM | POA: Diagnosis not present

## 2015-06-22 DIAGNOSIS — R296 Repeated falls: Secondary | ICD-10-CM | POA: Diagnosis not present

## 2015-06-22 DIAGNOSIS — I5030 Unspecified diastolic (congestive) heart failure: Secondary | ICD-10-CM | POA: Diagnosis not present

## 2015-06-23 DIAGNOSIS — I5022 Chronic systolic (congestive) heart failure: Secondary | ICD-10-CM | POA: Diagnosis not present

## 2015-06-23 DIAGNOSIS — R600 Localized edema: Secondary | ICD-10-CM | POA: Diagnosis not present

## 2015-06-24 DIAGNOSIS — I480 Paroxysmal atrial fibrillation: Secondary | ICD-10-CM | POA: Diagnosis not present

## 2015-07-03 DIAGNOSIS — Z79899 Other long term (current) drug therapy: Secondary | ICD-10-CM | POA: Diagnosis not present

## 2015-07-06 ENCOUNTER — Other Ambulatory Visit: Payer: Self-pay

## 2015-07-06 NOTE — Patient Outreach (Addendum)
[facility]) Care Management  07/06/2015  SHAUNTEA KADY Jan 27, 1929 [id number]    Telephone Screen  Referral Date: 07/02/15 Referral Source: [facility] tier 4 list Referral Reason: "0 admissions, 4 ER visits, CHF diagnosis"   Outreach attempt #1 to patient. Number listed for patient is not in service. No alternate numbers for patient. Contacted emergency contact/dtr-[facility] [place]. No PHI given. Dtr freely reported that patient is considered a "level two resident" at [facility]. She is seen by facility MD and care is coordinated and managed that way. Dtr unsure of MD name at facility. She voices no RN CM needs and states patient would not be a good candidate of services due to cognitive status.   Plan: RN CM will notify [facility] administrative assistant of case closure. RN CM unable to send MD case closure as MD not known/not in system.   Enzo Montgomery, RN,BSN,CCM [facility] Telephonic Care Management Coordinator Direct Phone: [phone] Toll Free: [phone] Fax: [phone]

## 2015-07-08 DIAGNOSIS — I10 Essential (primary) hypertension: Secondary | ICD-10-CM | POA: Diagnosis not present

## 2015-07-08 DIAGNOSIS — I480 Paroxysmal atrial fibrillation: Secondary | ICD-10-CM | POA: Diagnosis not present

## 2015-07-08 DIAGNOSIS — I5022 Chronic systolic (congestive) heart failure: Secondary | ICD-10-CM | POA: Diagnosis not present

## 2015-07-15 DIAGNOSIS — M797 Fibromyalgia: Secondary | ICD-10-CM | POA: Diagnosis not present

## 2015-07-15 DIAGNOSIS — I6789 Other cerebrovascular disease: Secondary | ICD-10-CM | POA: Diagnosis not present

## 2015-07-15 DIAGNOSIS — M6281 Muscle weakness (generalized): Secondary | ICD-10-CM | POA: Diagnosis not present

## 2015-07-15 DIAGNOSIS — I639 Cerebral infarction, unspecified: Secondary | ICD-10-CM | POA: Diagnosis not present

## 2015-07-21 DIAGNOSIS — I5032 Chronic diastolic (congestive) heart failure: Secondary | ICD-10-CM | POA: Diagnosis not present

## 2015-07-21 DIAGNOSIS — M797 Fibromyalgia: Secondary | ICD-10-CM | POA: Diagnosis not present

## 2015-07-21 DIAGNOSIS — R269 Unspecified abnormalities of gait and mobility: Secondary | ICD-10-CM | POA: Diagnosis not present

## 2015-07-21 DIAGNOSIS — M1990 Unspecified osteoarthritis, unspecified site: Secondary | ICD-10-CM | POA: Diagnosis not present

## 2015-07-21 DIAGNOSIS — I4891 Unspecified atrial fibrillation: Secondary | ICD-10-CM | POA: Diagnosis not present

## 2015-07-21 DIAGNOSIS — R296 Repeated falls: Secondary | ICD-10-CM | POA: Diagnosis not present

## 2015-08-14 DIAGNOSIS — I639 Cerebral infarction, unspecified: Secondary | ICD-10-CM | POA: Diagnosis not present

## 2015-08-14 DIAGNOSIS — M797 Fibromyalgia: Secondary | ICD-10-CM | POA: Diagnosis not present

## 2015-08-14 DIAGNOSIS — M6281 Muscle weakness (generalized): Secondary | ICD-10-CM | POA: Diagnosis not present

## 2015-08-14 DIAGNOSIS — I6789 Other cerebrovascular disease: Secondary | ICD-10-CM | POA: Diagnosis not present

## 2015-08-18 DIAGNOSIS — F05 Delirium due to known physiological condition: Secondary | ICD-10-CM | POA: Diagnosis not present

## 2015-08-18 DIAGNOSIS — Z8673 Personal history of transient ischemic attack (TIA), and cerebral infarction without residual deficits: Secondary | ICD-10-CM | POA: Diagnosis not present

## 2015-08-18 DIAGNOSIS — M6281 Muscle weakness (generalized): Secondary | ICD-10-CM | POA: Diagnosis not present

## 2015-08-18 DIAGNOSIS — I10 Essential (primary) hypertension: Secondary | ICD-10-CM | POA: Diagnosis not present

## 2015-08-18 DIAGNOSIS — R609 Edema, unspecified: Secondary | ICD-10-CM | POA: Diagnosis not present

## 2015-08-18 DIAGNOSIS — I5030 Unspecified diastolic (congestive) heart failure: Secondary | ICD-10-CM | POA: Diagnosis not present

## 2015-08-18 DIAGNOSIS — K219 Gastro-esophageal reflux disease without esophagitis: Secondary | ICD-10-CM | POA: Diagnosis not present

## 2015-08-18 DIAGNOSIS — G309 Alzheimer's disease, unspecified: Secondary | ICD-10-CM | POA: Diagnosis not present

## 2015-08-21 DIAGNOSIS — Z79899 Other long term (current) drug therapy: Secondary | ICD-10-CM | POA: Diagnosis not present

## 2015-09-07 DIAGNOSIS — M6281 Muscle weakness (generalized): Secondary | ICD-10-CM | POA: Diagnosis not present

## 2015-09-07 DIAGNOSIS — R197 Diarrhea, unspecified: Secondary | ICD-10-CM | POA: Diagnosis not present

## 2015-09-07 DIAGNOSIS — R609 Edema, unspecified: Secondary | ICD-10-CM | POA: Diagnosis not present

## 2015-09-07 DIAGNOSIS — K589 Irritable bowel syndrome without diarrhea: Secondary | ICD-10-CM | POA: Diagnosis not present

## 2015-09-07 DIAGNOSIS — F329 Major depressive disorder, single episode, unspecified: Secondary | ICD-10-CM | POA: Diagnosis not present

## 2015-09-07 DIAGNOSIS — I10 Essential (primary) hypertension: Secondary | ICD-10-CM | POA: Diagnosis not present

## 2015-09-07 DIAGNOSIS — G309 Alzheimer's disease, unspecified: Secondary | ICD-10-CM | POA: Diagnosis not present

## 2015-09-07 DIAGNOSIS — G2581 Restless legs syndrome: Secondary | ICD-10-CM | POA: Diagnosis not present

## 2015-09-14 DIAGNOSIS — Z79899 Other long term (current) drug therapy: Secondary | ICD-10-CM | POA: Diagnosis not present

## 2015-09-14 DIAGNOSIS — M797 Fibromyalgia: Secondary | ICD-10-CM | POA: Diagnosis not present

## 2015-09-14 DIAGNOSIS — I639 Cerebral infarction, unspecified: Secondary | ICD-10-CM | POA: Diagnosis not present

## 2015-09-14 DIAGNOSIS — M6281 Muscle weakness (generalized): Secondary | ICD-10-CM | POA: Diagnosis not present

## 2015-09-14 DIAGNOSIS — I6789 Other cerebrovascular disease: Secondary | ICD-10-CM | POA: Diagnosis not present

## 2015-09-21 DIAGNOSIS — M6281 Muscle weakness (generalized): Secondary | ICD-10-CM | POA: Diagnosis not present

## 2015-09-21 DIAGNOSIS — F329 Major depressive disorder, single episode, unspecified: Secondary | ICD-10-CM | POA: Diagnosis not present

## 2015-09-21 DIAGNOSIS — K589 Irritable bowel syndrome without diarrhea: Secondary | ICD-10-CM | POA: Diagnosis not present

## 2015-09-21 DIAGNOSIS — R269 Unspecified abnormalities of gait and mobility: Secondary | ICD-10-CM | POA: Diagnosis not present

## 2015-09-21 DIAGNOSIS — R600 Localized edema: Secondary | ICD-10-CM | POA: Diagnosis not present

## 2015-09-21 DIAGNOSIS — G309 Alzheimer's disease, unspecified: Secondary | ICD-10-CM | POA: Diagnosis not present

## 2015-10-05 DIAGNOSIS — R609 Edema, unspecified: Secondary | ICD-10-CM | POA: Diagnosis not present

## 2015-10-05 DIAGNOSIS — I10 Essential (primary) hypertension: Secondary | ICD-10-CM | POA: Diagnosis not present

## 2015-10-05 DIAGNOSIS — G309 Alzheimer's disease, unspecified: Secondary | ICD-10-CM | POA: Diagnosis not present

## 2015-10-05 DIAGNOSIS — F329 Major depressive disorder, single episode, unspecified: Secondary | ICD-10-CM | POA: Diagnosis not present

## 2015-10-14 DIAGNOSIS — I639 Cerebral infarction, unspecified: Secondary | ICD-10-CM | POA: Diagnosis not present

## 2015-10-14 DIAGNOSIS — M797 Fibromyalgia: Secondary | ICD-10-CM | POA: Diagnosis not present

## 2015-10-14 DIAGNOSIS — I6789 Other cerebrovascular disease: Secondary | ICD-10-CM | POA: Diagnosis not present

## 2015-10-14 DIAGNOSIS — M6281 Muscle weakness (generalized): Secondary | ICD-10-CM | POA: Diagnosis not present

## 2015-10-19 DIAGNOSIS — Z79899 Other long term (current) drug therapy: Secondary | ICD-10-CM | POA: Diagnosis not present

## 2015-10-26 DIAGNOSIS — I6930 Unspecified sequelae of cerebral infarction: Secondary | ICD-10-CM | POA: Diagnosis not present

## 2015-10-26 DIAGNOSIS — I10 Essential (primary) hypertension: Secondary | ICD-10-CM | POA: Diagnosis not present

## 2015-10-26 DIAGNOSIS — M6281 Muscle weakness (generalized): Secondary | ICD-10-CM | POA: Diagnosis not present

## 2015-10-26 DIAGNOSIS — R609 Edema, unspecified: Secondary | ICD-10-CM | POA: Diagnosis not present

## 2015-10-26 DIAGNOSIS — K5900 Constipation, unspecified: Secondary | ICD-10-CM | POA: Diagnosis not present

## 2015-10-26 DIAGNOSIS — N183 Chronic kidney disease, stage 3 (moderate): Secondary | ICD-10-CM | POA: Diagnosis not present

## 2015-10-26 DIAGNOSIS — I679 Cerebrovascular disease, unspecified: Secondary | ICD-10-CM | POA: Diagnosis not present

## 2015-10-26 DIAGNOSIS — E785 Hyperlipidemia, unspecified: Secondary | ICD-10-CM | POA: Diagnosis not present

## 2015-10-28 DIAGNOSIS — Z79899 Other long term (current) drug therapy: Secondary | ICD-10-CM | POA: Diagnosis not present

## 2015-11-14 DIAGNOSIS — M797 Fibromyalgia: Secondary | ICD-10-CM | POA: Diagnosis not present

## 2015-11-14 DIAGNOSIS — I6789 Other cerebrovascular disease: Secondary | ICD-10-CM | POA: Diagnosis not present

## 2015-11-14 DIAGNOSIS — I639 Cerebral infarction, unspecified: Secondary | ICD-10-CM | POA: Diagnosis not present

## 2015-11-14 DIAGNOSIS — M6281 Muscle weakness (generalized): Secondary | ICD-10-CM | POA: Diagnosis not present

## 2015-11-16 DIAGNOSIS — R609 Edema, unspecified: Secondary | ICD-10-CM | POA: Diagnosis not present

## 2015-11-16 DIAGNOSIS — G2581 Restless legs syndrome: Secondary | ICD-10-CM | POA: Diagnosis not present

## 2015-11-16 DIAGNOSIS — K219 Gastro-esophageal reflux disease without esophagitis: Secondary | ICD-10-CM | POA: Diagnosis not present

## 2015-11-16 DIAGNOSIS — I872 Venous insufficiency (chronic) (peripheral): Secondary | ICD-10-CM | POA: Diagnosis not present

## 2015-11-16 DIAGNOSIS — R32 Unspecified urinary incontinence: Secondary | ICD-10-CM | POA: Diagnosis not present

## 2015-11-16 DIAGNOSIS — N390 Urinary tract infection, site not specified: Secondary | ICD-10-CM | POA: Diagnosis not present

## 2015-11-16 DIAGNOSIS — M6281 Muscle weakness (generalized): Secondary | ICD-10-CM | POA: Diagnosis not present

## 2015-11-16 DIAGNOSIS — N952 Postmenopausal atrophic vaginitis: Secondary | ICD-10-CM | POA: Diagnosis not present

## 2015-11-30 DIAGNOSIS — G629 Polyneuropathy, unspecified: Secondary | ICD-10-CM | POA: Diagnosis not present

## 2015-11-30 DIAGNOSIS — J069 Acute upper respiratory infection, unspecified: Secondary | ICD-10-CM | POA: Diagnosis not present

## 2015-11-30 DIAGNOSIS — K219 Gastro-esophageal reflux disease without esophagitis: Secondary | ICD-10-CM | POA: Diagnosis not present

## 2015-11-30 DIAGNOSIS — K589 Irritable bowel syndrome without diarrhea: Secondary | ICD-10-CM | POA: Diagnosis not present

## 2015-11-30 DIAGNOSIS — R197 Diarrhea, unspecified: Secondary | ICD-10-CM | POA: Diagnosis not present

## 2015-11-30 DIAGNOSIS — G2581 Restless legs syndrome: Secondary | ICD-10-CM | POA: Diagnosis not present

## 2015-11-30 DIAGNOSIS — R05 Cough: Secondary | ICD-10-CM | POA: Diagnosis not present

## 2015-11-30 DIAGNOSIS — R5383 Other fatigue: Secondary | ICD-10-CM | POA: Diagnosis not present

## 2015-12-10 NOTE — Progress Notes (Signed)
This encounter was created in error - please disregard.

## 2015-12-14 DIAGNOSIS — I10 Essential (primary) hypertension: Secondary | ICD-10-CM | POA: Diagnosis not present

## 2015-12-14 DIAGNOSIS — K589 Irritable bowel syndrome without diarrhea: Secondary | ICD-10-CM | POA: Diagnosis not present

## 2015-12-14 DIAGNOSIS — Z87440 Personal history of urinary (tract) infections: Secondary | ICD-10-CM | POA: Diagnosis not present

## 2015-12-14 DIAGNOSIS — R600 Localized edema: Secondary | ICD-10-CM | POA: Diagnosis not present

## 2015-12-14 DIAGNOSIS — G2581 Restless legs syndrome: Secondary | ICD-10-CM | POA: Diagnosis not present

## 2015-12-14 DIAGNOSIS — R197 Diarrhea, unspecified: Secondary | ICD-10-CM | POA: Diagnosis not present

## 2015-12-14 DIAGNOSIS — M6281 Muscle weakness (generalized): Secondary | ICD-10-CM | POA: Diagnosis not present

## 2015-12-15 DIAGNOSIS — I6789 Other cerebrovascular disease: Secondary | ICD-10-CM | POA: Diagnosis not present

## 2015-12-15 DIAGNOSIS — I639 Cerebral infarction, unspecified: Secondary | ICD-10-CM | POA: Diagnosis not present

## 2015-12-15 DIAGNOSIS — M6281 Muscle weakness (generalized): Secondary | ICD-10-CM | POA: Diagnosis not present

## 2015-12-15 DIAGNOSIS — M797 Fibromyalgia: Secondary | ICD-10-CM | POA: Diagnosis not present

## 2015-12-21 DIAGNOSIS — J069 Acute upper respiratory infection, unspecified: Secondary | ICD-10-CM | POA: Diagnosis not present

## 2015-12-21 DIAGNOSIS — R05 Cough: Secondary | ICD-10-CM | POA: Diagnosis not present

## 2016-01-13 DIAGNOSIS — Z79899 Other long term (current) drug therapy: Secondary | ICD-10-CM | POA: Diagnosis not present

## 2016-01-18 DIAGNOSIS — R600 Localized edema: Secondary | ICD-10-CM | POA: Diagnosis not present

## 2016-01-18 DIAGNOSIS — G309 Alzheimer's disease, unspecified: Secondary | ICD-10-CM | POA: Diagnosis not present

## 2016-01-18 DIAGNOSIS — R05 Cough: Secondary | ICD-10-CM | POA: Diagnosis not present

## 2016-01-18 DIAGNOSIS — R4182 Altered mental status, unspecified: Secondary | ICD-10-CM | POA: Diagnosis not present

## 2016-01-18 DIAGNOSIS — N189 Chronic kidney disease, unspecified: Secondary | ICD-10-CM | POA: Diagnosis not present

## 2016-01-18 DIAGNOSIS — J069 Acute upper respiratory infection, unspecified: Secondary | ICD-10-CM | POA: Diagnosis not present

## 2016-01-18 DIAGNOSIS — R5383 Other fatigue: Secondary | ICD-10-CM | POA: Diagnosis not present

## 2016-01-18 DIAGNOSIS — R531 Weakness: Secondary | ICD-10-CM | POA: Diagnosis not present

## 2016-01-19 DIAGNOSIS — Z79899 Other long term (current) drug therapy: Secondary | ICD-10-CM | POA: Diagnosis not present

## 2016-02-01 DIAGNOSIS — N189 Chronic kidney disease, unspecified: Secondary | ICD-10-CM | POA: Diagnosis not present

## 2016-02-01 DIAGNOSIS — J069 Acute upper respiratory infection, unspecified: Secondary | ICD-10-CM | POA: Diagnosis not present

## 2016-02-01 DIAGNOSIS — I10 Essential (primary) hypertension: Secondary | ICD-10-CM | POA: Diagnosis not present

## 2016-02-01 DIAGNOSIS — R4182 Altered mental status, unspecified: Secondary | ICD-10-CM | POA: Diagnosis not present

## 2016-02-01 DIAGNOSIS — E860 Dehydration: Secondary | ICD-10-CM | POA: Diagnosis not present

## 2016-02-01 DIAGNOSIS — R600 Localized edema: Secondary | ICD-10-CM | POA: Diagnosis not present

## 2016-02-03 DIAGNOSIS — Z79899 Other long term (current) drug therapy: Secondary | ICD-10-CM | POA: Diagnosis not present

## 2016-03-22 DIAGNOSIS — M79604 Pain in right leg: Secondary | ICD-10-CM | POA: Diagnosis not present

## 2016-03-22 DIAGNOSIS — I10 Essential (primary) hypertension: Secondary | ICD-10-CM | POA: Diagnosis not present

## 2016-03-22 DIAGNOSIS — B379 Candidiasis, unspecified: Secondary | ICD-10-CM | POA: Diagnosis not present

## 2016-03-22 DIAGNOSIS — M79605 Pain in left leg: Secondary | ICD-10-CM | POA: Diagnosis not present

## 2016-03-22 DIAGNOSIS — R600 Localized edema: Secondary | ICD-10-CM | POA: Diagnosis not present

## 2016-03-22 DIAGNOSIS — N189 Chronic kidney disease, unspecified: Secondary | ICD-10-CM | POA: Diagnosis not present

## 2016-03-25 DIAGNOSIS — Z79899 Other long term (current) drug therapy: Secondary | ICD-10-CM | POA: Diagnosis not present

## 2016-03-28 DIAGNOSIS — R197 Diarrhea, unspecified: Secondary | ICD-10-CM | POA: Diagnosis not present

## 2016-03-28 DIAGNOSIS — M79605 Pain in left leg: Secondary | ICD-10-CM | POA: Diagnosis not present

## 2016-03-28 DIAGNOSIS — N189 Chronic kidney disease, unspecified: Secondary | ICD-10-CM | POA: Diagnosis not present

## 2016-03-28 DIAGNOSIS — M79604 Pain in right leg: Secondary | ICD-10-CM | POA: Diagnosis not present

## 2016-03-28 DIAGNOSIS — M6281 Muscle weakness (generalized): Secondary | ICD-10-CM | POA: Diagnosis not present

## 2016-03-28 DIAGNOSIS — R600 Localized edema: Secondary | ICD-10-CM | POA: Diagnosis not present

## 2016-04-05 DIAGNOSIS — N189 Chronic kidney disease, unspecified: Secondary | ICD-10-CM | POA: Diagnosis not present

## 2016-04-12 DIAGNOSIS — E785 Hyperlipidemia, unspecified: Secondary | ICD-10-CM | POA: Diagnosis not present

## 2016-04-12 DIAGNOSIS — N182 Chronic kidney disease, stage 2 (mild): Secondary | ICD-10-CM | POA: Diagnosis not present

## 2016-04-12 DIAGNOSIS — I10 Essential (primary) hypertension: Secondary | ICD-10-CM | POA: Diagnosis not present

## 2016-04-12 DIAGNOSIS — F411 Generalized anxiety disorder: Secondary | ICD-10-CM | POA: Diagnosis not present

## 2016-04-12 DIAGNOSIS — G309 Alzheimer's disease, unspecified: Secondary | ICD-10-CM | POA: Diagnosis not present

## 2016-04-12 DIAGNOSIS — K219 Gastro-esophageal reflux disease without esophagitis: Secondary | ICD-10-CM | POA: Diagnosis not present

## 2016-04-12 DIAGNOSIS — F419 Anxiety disorder, unspecified: Secondary | ICD-10-CM | POA: Diagnosis not present

## 2016-04-25 DIAGNOSIS — R269 Unspecified abnormalities of gait and mobility: Secondary | ICD-10-CM | POA: Diagnosis not present

## 2016-04-25 DIAGNOSIS — J069 Acute upper respiratory infection, unspecified: Secondary | ICD-10-CM | POA: Diagnosis not present

## 2016-04-25 DIAGNOSIS — R51 Headache: Secondary | ICD-10-CM | POA: Diagnosis not present

## 2016-04-25 DIAGNOSIS — M545 Low back pain: Secondary | ICD-10-CM | POA: Diagnosis not present

## 2016-04-25 DIAGNOSIS — R4182 Altered mental status, unspecified: Secondary | ICD-10-CM | POA: Diagnosis not present

## 2016-04-25 DIAGNOSIS — G9009 Other idiopathic peripheral autonomic neuropathy: Secondary | ICD-10-CM | POA: Diagnosis not present

## 2016-04-25 DIAGNOSIS — G309 Alzheimer's disease, unspecified: Secondary | ICD-10-CM | POA: Diagnosis not present

## 2016-04-25 DIAGNOSIS — R609 Edema, unspecified: Secondary | ICD-10-CM | POA: Diagnosis not present

## 2016-04-27 ENCOUNTER — Other Ambulatory Visit: Payer: Self-pay | Admitting: Physician Assistant

## 2016-04-27 ENCOUNTER — Other Ambulatory Visit: Payer: Self-pay | Admitting: Pharmacist

## 2016-04-27 DIAGNOSIS — Z79899 Other long term (current) drug therapy: Secondary | ICD-10-CM | POA: Diagnosis not present

## 2016-04-28 DIAGNOSIS — E785 Hyperlipidemia, unspecified: Secondary | ICD-10-CM | POA: Diagnosis not present

## 2016-04-28 DIAGNOSIS — Z9181 History of falling: Secondary | ICD-10-CM | POA: Diagnosis not present

## 2016-04-28 DIAGNOSIS — M1991 Primary osteoarthritis, unspecified site: Secondary | ICD-10-CM | POA: Diagnosis not present

## 2016-04-28 DIAGNOSIS — Z8673 Personal history of transient ischemic attack (TIA), and cerebral infarction without residual deficits: Secondary | ICD-10-CM | POA: Diagnosis not present

## 2016-04-28 DIAGNOSIS — M797 Fibromyalgia: Secondary | ICD-10-CM | POA: Diagnosis not present

## 2016-04-28 DIAGNOSIS — I110 Hypertensive heart disease with heart failure: Secondary | ICD-10-CM | POA: Diagnosis not present

## 2016-04-28 DIAGNOSIS — I4891 Unspecified atrial fibrillation: Secondary | ICD-10-CM | POA: Diagnosis not present

## 2016-04-28 DIAGNOSIS — I5032 Chronic diastolic (congestive) heart failure: Secondary | ICD-10-CM | POA: Diagnosis not present

## 2016-04-28 DIAGNOSIS — Z7982 Long term (current) use of aspirin: Secondary | ICD-10-CM | POA: Diagnosis not present

## 2016-04-28 DIAGNOSIS — R262 Difficulty in walking, not elsewhere classified: Secondary | ICD-10-CM | POA: Diagnosis not present

## 2016-04-28 DIAGNOSIS — G2581 Restless legs syndrome: Secondary | ICD-10-CM | POA: Diagnosis not present

## 2016-04-28 DIAGNOSIS — Z7902 Long term (current) use of antithrombotics/antiplatelets: Secondary | ICD-10-CM | POA: Diagnosis not present

## 2016-04-29 ENCOUNTER — Other Ambulatory Visit: Payer: Self-pay | Admitting: Physician Assistant

## 2016-04-29 DIAGNOSIS — W01198A Fall on same level from slipping, tripping and stumbling with subsequent striking against other object, initial encounter: Secondary | ICD-10-CM

## 2016-05-13 ENCOUNTER — Ambulatory Visit
Admission: RE | Admit: 2016-05-13 | Discharge: 2016-05-13 | Disposition: A | Discharge status: Home / Self Care | Payer: PPO | Source: Ambulatory Visit | Attending: Physician Assistant | Admitting: Physician Assistant

## 2016-05-13 DIAGNOSIS — W01198A Fall on same level from slipping, tripping and stumbling with subsequent striking against other object, initial encounter: Secondary | ICD-10-CM | POA: Insufficient documentation

## 2016-05-13 DIAGNOSIS — R2689 Other abnormalities of gait and mobility: Secondary | ICD-10-CM | POA: Diagnosis not present

## 2016-05-13 DIAGNOSIS — I6782 Cerebral ischemia: Secondary | ICD-10-CM | POA: Diagnosis not present

## 2016-05-13 DIAGNOSIS — S0990XA Unspecified injury of head, initial encounter: Secondary | ICD-10-CM | POA: Diagnosis not present

## 2016-05-13 DIAGNOSIS — F04 Amnestic disorder due to known physiological condition: Secondary | ICD-10-CM | POA: Diagnosis not present

## 2016-05-16 DIAGNOSIS — M25561 Pain in right knee: Secondary | ICD-10-CM | POA: Diagnosis not present

## 2016-05-16 DIAGNOSIS — R609 Edema, unspecified: Secondary | ICD-10-CM | POA: Diagnosis not present

## 2016-05-16 DIAGNOSIS — R52 Pain, unspecified: Secondary | ICD-10-CM | POA: Diagnosis not present

## 2016-05-16 DIAGNOSIS — R269 Unspecified abnormalities of gait and mobility: Secondary | ICD-10-CM | POA: Diagnosis not present

## 2016-05-16 DIAGNOSIS — I5030 Unspecified diastolic (congestive) heart failure: Secondary | ICD-10-CM | POA: Diagnosis not present

## 2016-05-16 DIAGNOSIS — R54 Age-related physical debility: Secondary | ICD-10-CM | POA: Diagnosis not present

## 2016-05-16 DIAGNOSIS — Z79899 Other long term (current) drug therapy: Secondary | ICD-10-CM | POA: Diagnosis not present

## 2016-05-16 DIAGNOSIS — M6281 Muscle weakness (generalized): Secondary | ICD-10-CM | POA: Diagnosis not present

## 2016-05-18 DIAGNOSIS — K219 Gastro-esophageal reflux disease without esophagitis: Secondary | ICD-10-CM | POA: Diagnosis not present

## 2016-05-18 DIAGNOSIS — I10 Essential (primary) hypertension: Secondary | ICD-10-CM | POA: Diagnosis not present

## 2016-05-18 DIAGNOSIS — F419 Anxiety disorder, unspecified: Secondary | ICD-10-CM | POA: Diagnosis not present

## 2016-05-18 DIAGNOSIS — N182 Chronic kidney disease, stage 2 (mild): Secondary | ICD-10-CM | POA: Diagnosis not present

## 2016-05-18 DIAGNOSIS — I480 Paroxysmal atrial fibrillation: Secondary | ICD-10-CM | POA: Diagnosis not present

## 2016-05-18 DIAGNOSIS — F411 Generalized anxiety disorder: Secondary | ICD-10-CM | POA: Diagnosis not present

## 2016-05-18 DIAGNOSIS — G309 Alzheimer's disease, unspecified: Secondary | ICD-10-CM | POA: Diagnosis not present

## 2016-05-18 DIAGNOSIS — R4182 Altered mental status, unspecified: Secondary | ICD-10-CM | POA: Diagnosis not present

## 2016-05-26 DIAGNOSIS — Z79899 Other long term (current) drug therapy: Secondary | ICD-10-CM | POA: Diagnosis not present

## 2016-05-30 DIAGNOSIS — D649 Anemia, unspecified: Secondary | ICD-10-CM | POA: Diagnosis not present

## 2016-05-30 DIAGNOSIS — M6281 Muscle weakness (generalized): Secondary | ICD-10-CM | POA: Diagnosis not present

## 2016-05-30 DIAGNOSIS — R609 Edema, unspecified: Secondary | ICD-10-CM | POA: Diagnosis not present

## 2016-05-30 DIAGNOSIS — N189 Chronic kidney disease, unspecified: Secondary | ICD-10-CM | POA: Diagnosis not present

## 2016-05-30 DIAGNOSIS — E875 Hyperkalemia: Secondary | ICD-10-CM | POA: Diagnosis not present

## 2016-05-30 DIAGNOSIS — R05 Cough: Secondary | ICD-10-CM | POA: Diagnosis not present

## 2016-05-30 DIAGNOSIS — I5030 Unspecified diastolic (congestive) heart failure: Secondary | ICD-10-CM | POA: Diagnosis not present

## 2016-05-31 DIAGNOSIS — E875 Hyperkalemia: Secondary | ICD-10-CM | POA: Diagnosis not present

## 2016-05-31 DIAGNOSIS — Z79899 Other long term (current) drug therapy: Secondary | ICD-10-CM | POA: Diagnosis not present

## 2016-06-06 DIAGNOSIS — J069 Acute upper respiratory infection, unspecified: Secondary | ICD-10-CM | POA: Diagnosis not present

## 2016-06-06 DIAGNOSIS — E875 Hyperkalemia: Secondary | ICD-10-CM | POA: Diagnosis not present

## 2016-06-06 DIAGNOSIS — R600 Localized edema: Secondary | ICD-10-CM | POA: Diagnosis not present

## 2016-06-06 DIAGNOSIS — N189 Chronic kidney disease, unspecified: Secondary | ICD-10-CM | POA: Diagnosis not present

## 2016-06-13 DIAGNOSIS — D649 Anemia, unspecified: Secondary | ICD-10-CM | POA: Diagnosis not present

## 2016-06-13 DIAGNOSIS — Z79899 Other long term (current) drug therapy: Secondary | ICD-10-CM | POA: Diagnosis not present

## 2016-06-27 DIAGNOSIS — I5030 Unspecified diastolic (congestive) heart failure: Secondary | ICD-10-CM | POA: Diagnosis not present

## 2016-06-27 DIAGNOSIS — D631 Anemia in chronic kidney disease: Secondary | ICD-10-CM | POA: Diagnosis not present

## 2016-06-27 DIAGNOSIS — I10 Essential (primary) hypertension: Secondary | ICD-10-CM | POA: Diagnosis not present

## 2016-06-27 DIAGNOSIS — M79606 Pain in leg, unspecified: Secondary | ICD-10-CM | POA: Diagnosis not present

## 2016-06-27 DIAGNOSIS — G309 Alzheimer's disease, unspecified: Secondary | ICD-10-CM | POA: Diagnosis not present

## 2016-06-27 DIAGNOSIS — M6281 Muscle weakness (generalized): Secondary | ICD-10-CM | POA: Diagnosis not present

## 2016-06-27 DIAGNOSIS — G2581 Restless legs syndrome: Secondary | ICD-10-CM | POA: Diagnosis not present

## 2016-06-27 DIAGNOSIS — R600 Localized edema: Secondary | ICD-10-CM | POA: Diagnosis not present

## 2016-06-30 DIAGNOSIS — B351 Tinea unguium: Secondary | ICD-10-CM | POA: Diagnosis not present

## 2016-06-30 DIAGNOSIS — I7389 Other specified peripheral vascular diseases: Secondary | ICD-10-CM | POA: Diagnosis not present

## 2016-06-30 DIAGNOSIS — R600 Localized edema: Secondary | ICD-10-CM | POA: Diagnosis not present

## 2016-06-30 DIAGNOSIS — L84 Corns and callosities: Secondary | ICD-10-CM | POA: Diagnosis not present

## 2016-07-28 DIAGNOSIS — R2689 Other abnormalities of gait and mobility: Secondary | ICD-10-CM | POA: Diagnosis not present

## 2016-07-28 DIAGNOSIS — R2681 Unsteadiness on feet: Secondary | ICD-10-CM | POA: Diagnosis not present

## 2016-07-28 DIAGNOSIS — M25562 Pain in left knee: Secondary | ICD-10-CM | POA: Diagnosis not present

## 2016-07-28 DIAGNOSIS — I6930 Unspecified sequelae of cerebral infarction: Secondary | ICD-10-CM | POA: Diagnosis not present

## 2016-07-28 DIAGNOSIS — M25561 Pain in right knee: Secondary | ICD-10-CM | POA: Diagnosis not present

## 2016-07-28 DIAGNOSIS — M797 Fibromyalgia: Secondary | ICD-10-CM | POA: Diagnosis not present

## 2016-07-28 DIAGNOSIS — M150 Primary generalized (osteo)arthritis: Secondary | ICD-10-CM | POA: Diagnosis not present

## 2016-07-28 DIAGNOSIS — I509 Heart failure, unspecified: Secondary | ICD-10-CM | POA: Diagnosis not present

## 2016-07-28 DIAGNOSIS — M6281 Muscle weakness (generalized): Secondary | ICD-10-CM | POA: Diagnosis not present

## 2016-08-22 DIAGNOSIS — Z79899 Other long term (current) drug therapy: Secondary | ICD-10-CM | POA: Diagnosis not present

## 2016-08-22 DIAGNOSIS — G309 Alzheimer's disease, unspecified: Secondary | ICD-10-CM | POA: Diagnosis not present

## 2016-08-22 DIAGNOSIS — F419 Anxiety disorder, unspecified: Secondary | ICD-10-CM | POA: Diagnosis not present

## 2016-08-22 DIAGNOSIS — F329 Major depressive disorder, single episode, unspecified: Secondary | ICD-10-CM | POA: Diagnosis not present

## 2016-08-22 DIAGNOSIS — R600 Localized edema: Secondary | ICD-10-CM | POA: Diagnosis not present

## 2016-08-22 DIAGNOSIS — M6281 Muscle weakness (generalized): Secondary | ICD-10-CM | POA: Diagnosis not present

## 2016-08-22 DIAGNOSIS — R0781 Pleurodynia: Secondary | ICD-10-CM | POA: Diagnosis not present

## 2016-08-22 DIAGNOSIS — I5030 Unspecified diastolic (congestive) heart failure: Secondary | ICD-10-CM | POA: Diagnosis not present

## 2016-08-22 DIAGNOSIS — M545 Low back pain: Secondary | ICD-10-CM | POA: Diagnosis not present

## 2016-08-22 DIAGNOSIS — R079 Chest pain, unspecified: Secondary | ICD-10-CM | POA: Diagnosis not present

## 2016-08-23 DIAGNOSIS — R2681 Unsteadiness on feet: Secondary | ICD-10-CM | POA: Diagnosis not present

## 2016-08-23 DIAGNOSIS — M797 Fibromyalgia: Secondary | ICD-10-CM | POA: Diagnosis not present

## 2016-08-23 DIAGNOSIS — I6930 Unspecified sequelae of cerebral infarction: Secondary | ICD-10-CM | POA: Diagnosis not present

## 2016-08-23 DIAGNOSIS — M25561 Pain in right knee: Secondary | ICD-10-CM | POA: Diagnosis not present

## 2016-08-23 DIAGNOSIS — M6281 Muscle weakness (generalized): Secondary | ICD-10-CM | POA: Diagnosis not present

## 2016-08-23 DIAGNOSIS — I509 Heart failure, unspecified: Secondary | ICD-10-CM | POA: Diagnosis not present

## 2016-08-23 DIAGNOSIS — Z79899 Other long term (current) drug therapy: Secondary | ICD-10-CM | POA: Diagnosis not present

## 2016-08-23 DIAGNOSIS — M150 Primary generalized (osteo)arthritis: Secondary | ICD-10-CM | POA: Diagnosis not present

## 2016-08-23 DIAGNOSIS — R2689 Other abnormalities of gait and mobility: Secondary | ICD-10-CM | POA: Diagnosis not present

## 2016-08-23 DIAGNOSIS — M25562 Pain in left knee: Secondary | ICD-10-CM | POA: Diagnosis not present

## 2016-08-29 DIAGNOSIS — M6281 Muscle weakness (generalized): Secondary | ICD-10-CM | POA: Diagnosis not present

## 2016-08-29 DIAGNOSIS — R600 Localized edema: Secondary | ICD-10-CM | POA: Diagnosis not present

## 2016-08-29 DIAGNOSIS — R079 Chest pain, unspecified: Secondary | ICD-10-CM | POA: Diagnosis not present

## 2016-08-29 DIAGNOSIS — E860 Dehydration: Secondary | ICD-10-CM | POA: Diagnosis not present

## 2016-08-29 DIAGNOSIS — F05 Delirium due to known physiological condition: Secondary | ICD-10-CM | POA: Diagnosis not present

## 2016-08-29 DIAGNOSIS — J181 Lobar pneumonia, unspecified organism: Secondary | ICD-10-CM | POA: Diagnosis not present

## 2016-09-01 DIAGNOSIS — H35111 Retinopathy of prematurity, stage 0, right eye: Secondary | ICD-10-CM | POA: Diagnosis not present

## 2016-09-01 DIAGNOSIS — R609 Edema, unspecified: Secondary | ICD-10-CM | POA: Diagnosis not present

## 2016-09-01 DIAGNOSIS — Z79899 Other long term (current) drug therapy: Secondary | ICD-10-CM | POA: Diagnosis not present

## 2016-09-01 DIAGNOSIS — Z961 Presence of intraocular lens: Secondary | ICD-10-CM | POA: Diagnosis not present

## 2016-09-01 DIAGNOSIS — N182 Chronic kidney disease, stage 2 (mild): Secondary | ICD-10-CM | POA: Diagnosis not present

## 2016-09-01 DIAGNOSIS — N179 Acute kidney failure, unspecified: Secondary | ICD-10-CM | POA: Diagnosis not present

## 2016-09-04 DIAGNOSIS — R350 Frequency of micturition: Secondary | ICD-10-CM | POA: Diagnosis not present

## 2016-09-04 DIAGNOSIS — R3915 Urgency of urination: Secondary | ICD-10-CM | POA: Diagnosis not present

## 2016-09-05 DIAGNOSIS — G9009 Other idiopathic peripheral autonomic neuropathy: Secondary | ICD-10-CM | POA: Diagnosis not present

## 2016-09-05 DIAGNOSIS — R609 Edema, unspecified: Secondary | ICD-10-CM | POA: Diagnosis not present

## 2016-09-05 DIAGNOSIS — N189 Chronic kidney disease, unspecified: Secondary | ICD-10-CM | POA: Diagnosis not present

## 2016-09-05 DIAGNOSIS — E860 Dehydration: Secondary | ICD-10-CM | POA: Diagnosis not present

## 2016-09-05 DIAGNOSIS — M79605 Pain in left leg: Secondary | ICD-10-CM | POA: Diagnosis not present

## 2016-09-05 DIAGNOSIS — N179 Acute kidney failure, unspecified: Secondary | ICD-10-CM | POA: Diagnosis not present

## 2016-09-05 DIAGNOSIS — G2581 Restless legs syndrome: Secondary | ICD-10-CM | POA: Diagnosis not present

## 2016-09-05 DIAGNOSIS — M79604 Pain in right leg: Secondary | ICD-10-CM | POA: Diagnosis not present

## 2016-09-12 DIAGNOSIS — N179 Acute kidney failure, unspecified: Secondary | ICD-10-CM | POA: Diagnosis not present

## 2016-09-12 DIAGNOSIS — R600 Localized edema: Secondary | ICD-10-CM | POA: Diagnosis not present

## 2016-09-12 DIAGNOSIS — N189 Chronic kidney disease, unspecified: Secondary | ICD-10-CM | POA: Diagnosis not present

## 2016-09-12 DIAGNOSIS — G629 Polyneuropathy, unspecified: Secondary | ICD-10-CM | POA: Diagnosis not present

## 2016-09-12 DIAGNOSIS — K219 Gastro-esophageal reflux disease without esophagitis: Secondary | ICD-10-CM | POA: Diagnosis not present

## 2016-09-15 DIAGNOSIS — Z79899 Other long term (current) drug therapy: Secondary | ICD-10-CM | POA: Diagnosis not present

## 2016-09-19 ENCOUNTER — Emergency Department: Payer: PPO

## 2016-09-19 ENCOUNTER — Emergency Department
Admission: EM | Admit: 2016-09-19 | Discharge: 2016-09-19 | Disposition: A | Discharge status: Home / Self Care | Payer: PPO | Attending: Emergency Medicine | Admitting: Emergency Medicine

## 2016-09-19 ENCOUNTER — Encounter: Payer: Self-pay | Admitting: Emergency Medicine

## 2016-09-19 DIAGNOSIS — Z8673 Personal history of transient ischemic attack (TIA), and cerebral infarction without residual deficits: Secondary | ICD-10-CM | POA: Diagnosis not present

## 2016-09-19 DIAGNOSIS — I2510 Atherosclerotic heart disease of native coronary artery without angina pectoris: Secondary | ICD-10-CM | POA: Diagnosis not present

## 2016-09-19 DIAGNOSIS — F0390 Unspecified dementia without behavioral disturbance: Secondary | ICD-10-CM | POA: Insufficient documentation

## 2016-09-19 DIAGNOSIS — I5031 Acute diastolic (congestive) heart failure: Secondary | ICD-10-CM | POA: Insufficient documentation

## 2016-09-19 DIAGNOSIS — Z7901 Long term (current) use of anticoagulants: Secondary | ICD-10-CM | POA: Insufficient documentation

## 2016-09-19 DIAGNOSIS — Z79810 Long term (current) use of selective estrogen receptor modulators (SERMs): Secondary | ICD-10-CM | POA: Insufficient documentation

## 2016-09-19 DIAGNOSIS — R1013 Epigastric pain: Secondary | ICD-10-CM | POA: Diagnosis not present

## 2016-09-19 DIAGNOSIS — R079 Chest pain, unspecified: Secondary | ICD-10-CM

## 2016-09-19 DIAGNOSIS — I110 Hypertensive heart disease with heart failure: Secondary | ICD-10-CM | POA: Insufficient documentation

## 2016-09-19 DIAGNOSIS — I272 Pulmonary hypertension, unspecified: Secondary | ICD-10-CM | POA: Insufficient documentation

## 2016-09-19 DIAGNOSIS — R0789 Other chest pain: Secondary | ICD-10-CM | POA: Diagnosis not present

## 2016-09-19 LAB — URINALYSIS, COMPLETE (UACMP) WITH MICROSCOPIC
BILIRUBIN URINE: NEGATIVE
GLUCOSE, UA: NEGATIVE mg/dL
Hgb urine dipstick: NEGATIVE
KETONES UR: NEGATIVE mg/dL
LEUKOCYTES UA: NEGATIVE
NITRITE: NEGATIVE
PH: 6 (ref 5.0–8.0)
Protein, ur: NEGATIVE mg/dL
RBC / HPF: NONE SEEN RBC/hpf (ref 0–5)
Specific Gravity, Urine: 1.01 (ref 1.005–1.030)

## 2016-09-19 LAB — HEPATIC FUNCTION PANEL
ALBUMIN: 4.6 g/dL (ref 3.5–5.0)
ALT: 36 U/L (ref 14–54)
AST: 29 U/L (ref 15–41)
Alkaline Phosphatase: 92 U/L (ref 38–126)
Bilirubin, Direct: <0.1 mg/dL — ABNORMAL LOW (ref 0.1–0.5)
TOTAL PROTEIN: 8.6 g/dL — AB (ref 6.5–8.1)
Total Bilirubin: 1.1 mg/dL (ref 0.3–1.2)

## 2016-09-19 LAB — BASIC METABOLIC PANEL
Anion gap: 11 (ref 5–15)
BUN: 32 mg/dL — ABNORMAL HIGH (ref 6–20)
CALCIUM: 9.7 mg/dL (ref 8.9–10.3)
CO2: 24 mmol/L (ref 22–32)
Chloride: 101 mmol/L (ref 101–111)
Creatinine, Ser: 1.12 mg/dL — ABNORMAL HIGH (ref 0.44–1.00)
GFR, EST AFRICAN AMERICAN: 50 mL/min — AB (ref >60)
GFR, EST NON AFRICAN AMERICAN: 44 mL/min — AB (ref >60)
Glucose, Bld: 118 mg/dL — ABNORMAL HIGH (ref 65–99)
POTASSIUM: 4.5 mmol/L (ref 3.5–5.1)
Sodium: 136 mmol/L (ref 135–145)

## 2016-09-19 LAB — CBC WITH DIFFERENTIAL/PLATELET
BASOS ABS: 0.1 10*3/uL (ref 0–0.1)
Basophils Relative: 1 %
Eosinophils Absolute: 0.2 10*3/uL (ref 0–0.7)
Eosinophils Relative: 2 %
HCT: 35.4 % (ref 35.0–47.0)
HEMOGLOBIN: 12.2 g/dL (ref 12.0–16.0)
LYMPHS PCT: 20 %
Lymphs Abs: 1.8 10*3/uL (ref 1.0–3.6)
MCH: 31.5 pg (ref 26.0–34.0)
MCHC: 34.6 g/dL (ref 32.0–36.0)
MCV: 91.3 fL (ref 80.0–100.0)
Monocytes Absolute: 0.9 10*3/uL (ref 0.2–0.9)
Monocytes Relative: 9 %
NEUTROS ABS: 6.3 10*3/uL (ref 1.4–6.5)
NEUTROS PCT: 68 %
PLATELETS: 285 10*3/uL (ref 150–440)
RBC: 3.88 MIL/uL (ref 3.80–5.20)
RDW: 14.3 % (ref 11.5–14.5)
WBC: 9.3 10*3/uL (ref 3.6–11.0)

## 2016-09-19 LAB — TROPONIN I: Troponin I: <0.03 ng/mL (ref <0.03)

## 2016-09-19 LAB — LIPASE, BLOOD: Lipase: 33 U/L (ref 11–51)

## 2016-09-19 LAB — BRAIN NATRIURETIC PEPTIDE: B Natriuretic Peptide: 473 pg/mL — ABNORMAL HIGH (ref 0.0–100.0)

## 2016-09-19 NOTE — Discharge Instructions (Signed)
Please return if pain returns. Please call Dr. Ubaldo Glassing, your cardiologist to arrange follow-up later this week.

## 2016-09-19 NOTE — ED Notes (Signed)
Spoke with patient's daughter to ask about transportation back to facility. Daughter states that she just got home and that she thought the ED would provide transportation. Daughter informed that patient does not meet EMS criteria and would be charged for use of ambulance. Told daughter that we would contact [facility] to see if they were able to provide transportation for patient. Daughter verbalized understanding.

## 2016-09-19 NOTE — ED Notes (Signed)
Patient assisted to use restroom. Positioned back in bed. No further needs expressed at this time.

## 2016-09-19 NOTE — ED Notes (Signed)
Spoke with [facility] about transporting patient. States they will have a staff member come to ED to pick patient up in 20-30 minutes.

## 2016-09-19 NOTE — ED Notes (Signed)
Patient assisted to restroom. Bed linen changed. External catheter placed. No other needs expressed at this time.

## 2016-09-19 NOTE — ED Notes (Signed)
Signature pad in room not working. Patient's daughter gave verbal consent for discharge and transportation back to [facility].

## 2016-09-19 NOTE — ED Provider Notes (Signed)
[facility] Emergency Department Provider Note   ____________________________________________   First MD Initiated Contact with Patient 09/19/16 1110     (approximate)  I have reviewed the triage vital signs and the nursing notes.   HISTORY  Chief Complaint Chest Pain    HPI Amanda Hogan is a 83 y.o. female history provided by patient and family. Patient was sitting outside and developed pain in her center of her chest that was achy and then moved down to the epigastric area and patient became flushed. Patient is uncertain of how long the pain lasted. Patient did come into the emergency room afterwards. Patient has no pain now. Patient does complain of a mild headache however. Patient thinks she might of been short of breath with the pain. She is not short of breath now. Pain was mild to moderate in nature and achy. Nothing seemed to make it better or worse. She's not sure what the pain felt like as far as any of her previous symptoms.   Past Medical History:  Diagnosis Date  . Acid reflux   . Angina   . Anxiety 02/23/2012  . Arthritis   . Chest pain    negative lexiscan myoview 53/13, last echo 02/24/12-EF 69-629, mid systolic obliteration of the LV, cavity aortic sclerosis  . Complication of anesthesia    "w/my back; gave me too much; thought I'd had stroke but didn't"  . Dementia 02/23/2012  . Exertional dyspnea   . Fibromyalgia   . Heart murmur   . High cholesterol   . HTN (hypertension)   . Hypertension   . Kidney stone   . Memory disorder, possibly not taking home meds at times 07/22/2011  . Mild carotid artery disease ([place]) 08/05/11   carotid dopplers, mild disease  . Restless leg syndrome   . Sinus malignant neoplasm ([place])   . Stroke [facility])    "said it looked like bullet holes; had a bunch"  . TIA (transient ischemic attack)     Patient Active Problem List   Diagnosis Date Noted  . Acute kidney injury ([place]) 12/27/2014  . Acute CVA  (cerebrovascular accident) ([place]) 12/16/2014  . Diastolic dysfunction-grade 2 10/29/2014  . Pulmonary hypertension-moderate 10/29/2014  . Troponin level elevated-suspect demand ischemia from AF with RVR 10/28/2014  . Lymphoma history 10/28/2014  . Acute diastolic CHF (congestive heart failure) ([place]) 10/27/2014  . Atrial fibrillation with RVR (new)   . TIA (transient ischemic attack) 02/25/2012  . Encephalopathy acute 02/23/2012  . Anemia 02/23/2012  . Chronic back pain 02/23/2012  . Anxiety 02/23/2012  . Mild carotid artery disease ([place]) 08/05/2011  . Memory disorder, possibly not taking home meds at times 07/22/2011  . Hypertension, at times poorly controlled 07/21/2011  . Chest pain-low risk Myoview May 2013 07/21/2011  . Hyperlipidemia 07/21/2011  . Gastroesophageal reflux disease 07/21/2011  . History of TIA (transient ischemic attack) 07/21/2011  . Obesity 07/21/2011    Past Surgical History:  Procedure Laterality Date  . BREAST CYST EXCISION     left  . CARPAL TUNNEL RELEASE     right  . CATARACT EXTRACTION W/ INTRAOCULAR LENS  IMPLANT, BILATERAL    . CHOLECYSTECTOMY  2005  . ELBOW BURSA SURGERY     left  . EYE SURGERY  1940   fixed my crossed eyes"  . HERNIA REPAIR  2007   "stomach"  . KIDNEY STONE SURGERY     "right side; cut it out"  . KNEE CARTILAGE SURGERY  right  . LUMBAR SPINE SURGERY     "put box in my lower back"  . LUNG SURGERY     "made 3 holes and pulled gel out"  . VAGINAL HYSTERECTOMY      Prior to Admission medications   Medication Sig Start Date End Date Taking? Authorizing Provider  acetaminophen (TYLENOL) 500 MG tablet Take 1,000 mg by mouth every 8 (eight) hours.    Yes [provider]  ALPRAZolam (XANAX) 0.25 MG tablet Take 1 tablet (0.25 mg total) by mouth daily as needed for anxiety. 01/01/15  Yes Wieting, Richard, MD  amLODipine (NORVASC) 10 MG tablet Take 5 mg by mouth daily.    Yes [provider]  aspirin (ASPIRIN  CHILDRENS) 81 MG chewable tablet Chew 81 mg by mouth every morning.   Yes [provider]  atorvastatin (LIPITOR) 40 MG tablet Take 1 tablet by mouth daily. 04/25/15  Yes [provider]  citalopram (CELEXA) 10 MG tablet Take 10 mg by mouth daily.   Yes [provider]  clopidogrel (PLAVIX) 75 MG tablet Take 75 mg by mouth daily.   Yes [provider]  cyanocobalamin 500 MCG tablet Take 500 mcg by mouth daily.   Yes [provider]  estradiol (ESTRACE) 0.1 MG/GM vaginal cream Place 1 g vaginally once a week.   Yes [provider]  furosemide (LASIX) 20 MG tablet Take 20 mg by mouth daily.   Yes [provider]  guaifenesin (ROBITUSSIN) 100 MG/5ML syrup Take 200 mg by mouth 3 (three) times daily as needed for cough or congestion.   Yes [provider]  loperamide (IMODIUM) 2 MG capsule Take 2 mg by mouth as needed for diarrhea or loose stools. Up to 6 doses in 24 hr as needed   Yes [provider]  memantine (NAMENDA) 10 MG tablet Take 10 mg by mouth at bedtime.   Yes [provider]  metoprolol succinate (TOPROL-XL) 50 MG 24 hr tablet Take 1 tablet (50 mg total) by mouth daily. Take with or immediately following a meal. 01/01/15  Yes Wieting, Richard, MD  ondansetron (ZOFRAN) 4 MG tablet Take 4 mg by mouth every 6 (six) hours as needed for nausea or vomiting.   Yes [provider]  QUEtiapine (SEROQUEL) 25 MG tablet Take 1 tablet (25 mg total) by mouth at bedtime. Patient taking differently: Take 12.5 mg by mouth 2 (two) times daily. Patient is taking at 8am and 6pm 12/19/14  Yes Sainani, Belia Heman, MD  ranitidine (ZANTAC) 150 MG tablet Take 150 mg by mouth daily.   Yes [provider]  rOPINIRole (REQUIP) 0.5 MG tablet Take 0.5 mg by mouth at bedtime.   Yes [provider]  rOPINIRole (REQUIP) 2 MG tablet Take 2 mg by mouth 3 (three) times daily.    Yes [provider]    sennosides-docusate sodium (SENOKOT-S) 8.6-50 MG tablet Take 2 tablets by mouth daily as needed for constipation.   Yes [provider]  spironolactone (ALDACTONE) 25 MG tablet Take 25 mg by mouth daily.   Yes [provider]  atorvastatin (LIPITOR) 80 MG tablet Take 40 mg by mouth daily. Take one tablet by mouth once daily for cholesterol    [provider]  ciprofloxacin (CIPRO) 250 MG tablet Take 250 mg by mouth 2 (two) times daily.    [provider]  gabapentin (NEURONTIN) 300 MG capsule Take 200 mg by mouth 3 (three) times daily.  [provider]  memantine (NAMENDA XR) 28 MG CP24 24 hr capsule Take 28 mg by mouth daily.    [provider]  nitroGLYCERIN (NITROSTAT) 0.4 MG SL tablet Place 0.4 mg under the tongue every 5 (five) minutes as needed for chest pain.     [provider]  pantoprazole (PROTONIX) 40 MG tablet Take 40 mg by mouth 2 (two) times daily.     [provider]  UNABLE TO FIND Med Name: Med Pass 240 cc by mouth three times daily with meals    [provider]    Allergies Iohexol; Codeine sulfate; and Contrast media [iodinated diagnostic agents]  Family History  Problem Relation Age of Onset  . Diabetes Neg Hx     Social History Social History  Substance Use Topics  . Smoking status: Never Smoker  . Smokeless tobacco: Never Used  . Alcohol use No    Review of Systems  Constitutional: No fever/chills Eyes: No visual changes. ENT: No sore throat. Cardiovascular: Denies chest pain At present. Respiratory: Denies shortness of breath at present. Gastrointestinal: No abdominal pain.  No nausea, no vomiting.  No diarrhea.  No constipation. Genitourinary: Negative for dysuria. Musculoskeletal: Negative for back pain. Skin: Negative for rash. Neurological: Negative for focal weakness or numbness.  ____________________________________________   PHYSICAL EXAM:  VITAL SIGNS: ED  Triage Vitals  Enc Vitals Group     BP 09/19/16 1041 (!) 157/66     Pulse Rate 09/19/16 1041 73     Resp 09/19/16 1041 16     Temp 09/19/16 1041 98.1 F (36.7 C)     Temp Source 09/19/16 1041 Oral     SpO2 09/19/16 1041 99 %     Weight 09/19/16 1038 200 lb (90.7 kg)     Height 09/19/16 1038 5\' 4"  (1.626 m)     Head Circumference --      Peak Flow --      Pain Score 09/19/16 1037 4     Pain Loc --      Pain Edu? --      Excl. in [place]? --     Constitutional: Alert and oriented. Well appearing and in no acute distress. Eyes: Conjunctivae are normal. PER.  Head: Atraumatic. Nose: No congestion/rhinnorhea. Mouth/Throat: Mucous membranes are moist.   Neck: No stridor.  Cardiovascular: Normal rate, regular rhythm. Grossly normal heart sounds.  Good peripheral circulation. Respiratory: Normal respiratory effort.  No retractions. Lungs CTAB. Gastrointestinal: Soft and nontender. No distention. No abdominal bruits. No CVA tenderness. Musculoskeletal: No lower extremity tenderness trace edema.  No joint effusions. Neurologic:  Normal speech and language. No gross focal neurologic deficits are appreciated. Skin:  Skin is warm, dry and intact. No rash noted.   ____________________________________________   LABS (all labs ordered are listed, but only abnormal results are displayed)  Labs Reviewed  BASIC METABOLIC PANEL - Abnormal; Notable for the following:       Result Value   Glucose, Bld 118 (*)    BUN 32 (*)    Creatinine, Ser 1.12 (*)    GFR calc non Af Amer 44 (*)    GFR calc Af Amer 50 (*)    All other components within normal limits  HEPATIC FUNCTION PANEL - Abnormal; Notable for the following:    Total Protein 8.6 (*)    Bilirubin, Direct <0.1 (*)    All other components within normal limits  BRAIN NATRIURETIC PEPTIDE - Abnormal; Notable for the following:  B Natriuretic Peptide 473.0 (*)    All other components within normal limits  URINALYSIS, COMPLETE (UACMP) WITH  MICROSCOPIC - Abnormal; Notable for the following:    Color, Urine YELLOW (*)    APPearance CLEAR (*)    Bacteria, UA MANY (*)    Squamous Epithelial / LPF 0-5 (*)    All other components within normal limits  TROPONIN I  CBC WITH DIFFERENTIAL/PLATELET  LIPASE, BLOOD  TROPONIN I   ____________________________________________  EKG  EKG read and interpreted by me shows normal sinus rhythm rate of 73 normal axis essentially normal EKG and the same as the EKG done by EMS. ____________________________________________  RADIOLOGY IMPRESSION: Chronic lung changes but no acute pulmonary findings.   ____________________________________________   PROCEDURES  Procedure(s) performed:   Procedures  Critical Care performed:   ____________________________________________   INITIAL IMPRESSION / ASSESSMENT AND PLAN / ED COURSE  Pertinent labs & imaging results that were available during my care of the patient were reviewed by me and considered in my medical decision making (see chart for details).        ____________________________________________   FINAL CLINICAL IMPRESSION(S) / ED DIAGNOSES  Final diagnoses:  Nonspecific chest pain      NEW MEDICATIONS STARTED DURING THIS VISIT:  Discharge Medication List as of 09/19/2016  5:07 PM       Note:  This document was prepared using Dragon voice recognition software and may include unintentional dictation errors.    Nena Polio, MD 09/19/16 2056

## 2016-09-19 NOTE — ED Triage Notes (Signed)
ARrives from assisted living facility with c/o epigastric discomfort, onset of symptoms this morning.  Denies N/V/D

## 2016-09-19 NOTE — ED Notes (Signed)
Assisted pt to toilet in pt's room

## 2016-09-22 DIAGNOSIS — M25562 Pain in left knee: Secondary | ICD-10-CM | POA: Diagnosis not present

## 2016-09-22 DIAGNOSIS — M6281 Muscle weakness (generalized): Secondary | ICD-10-CM | POA: Diagnosis not present

## 2016-09-22 DIAGNOSIS — R2681 Unsteadiness on feet: Secondary | ICD-10-CM | POA: Diagnosis not present

## 2016-09-22 DIAGNOSIS — I509 Heart failure, unspecified: Secondary | ICD-10-CM | POA: Diagnosis not present

## 2016-09-22 DIAGNOSIS — I6930 Unspecified sequelae of cerebral infarction: Secondary | ICD-10-CM | POA: Diagnosis not present

## 2016-09-22 DIAGNOSIS — M150 Primary generalized (osteo)arthritis: Secondary | ICD-10-CM | POA: Diagnosis not present

## 2016-09-22 DIAGNOSIS — R309 Painful micturition, unspecified: Secondary | ICD-10-CM | POA: Diagnosis not present

## 2016-09-22 DIAGNOSIS — R2689 Other abnormalities of gait and mobility: Secondary | ICD-10-CM | POA: Diagnosis not present

## 2016-09-22 DIAGNOSIS — M25561 Pain in right knee: Secondary | ICD-10-CM | POA: Diagnosis not present

## 2016-09-22 DIAGNOSIS — M797 Fibromyalgia: Secondary | ICD-10-CM | POA: Diagnosis not present

## 2016-09-26 DIAGNOSIS — I10 Essential (primary) hypertension: Secondary | ICD-10-CM | POA: Diagnosis not present

## 2016-09-26 DIAGNOSIS — S37009S Unspecified injury of unspecified kidney, sequela: Secondary | ICD-10-CM | POA: Diagnosis not present

## 2016-09-26 DIAGNOSIS — R609 Edema, unspecified: Secondary | ICD-10-CM | POA: Diagnosis not present

## 2016-09-26 DIAGNOSIS — R4182 Altered mental status, unspecified: Secondary | ICD-10-CM | POA: Diagnosis not present

## 2016-09-26 DIAGNOSIS — G309 Alzheimer's disease, unspecified: Secondary | ICD-10-CM | POA: Diagnosis not present

## 2016-09-26 DIAGNOSIS — N189 Chronic kidney disease, unspecified: Secondary | ICD-10-CM | POA: Diagnosis not present

## 2016-09-26 DIAGNOSIS — K219 Gastro-esophageal reflux disease without esophagitis: Secondary | ICD-10-CM | POA: Diagnosis not present

## 2016-09-26 DIAGNOSIS — I2510 Atherosclerotic heart disease of native coronary artery without angina pectoris: Secondary | ICD-10-CM | POA: Diagnosis not present

## 2016-10-03 DIAGNOSIS — G629 Polyneuropathy, unspecified: Secondary | ICD-10-CM | POA: Diagnosis not present

## 2016-10-03 DIAGNOSIS — N189 Chronic kidney disease, unspecified: Secondary | ICD-10-CM | POA: Diagnosis not present

## 2016-10-03 DIAGNOSIS — R600 Localized edema: Secondary | ICD-10-CM | POA: Diagnosis not present

## 2016-10-03 DIAGNOSIS — G2581 Restless legs syndrome: Secondary | ICD-10-CM | POA: Diagnosis not present

## 2016-10-03 DIAGNOSIS — M79606 Pain in leg, unspecified: Secondary | ICD-10-CM | POA: Diagnosis not present

## 2016-10-06 DIAGNOSIS — N3946 Mixed incontinence: Secondary | ICD-10-CM | POA: Diagnosis not present

## 2016-10-06 DIAGNOSIS — M6281 Muscle weakness (generalized): Secondary | ICD-10-CM | POA: Diagnosis not present

## 2016-10-06 DIAGNOSIS — Z8673 Personal history of transient ischemic attack (TIA), and cerebral infarction without residual deficits: Secondary | ICD-10-CM | POA: Diagnosis not present

## 2016-10-08 DIAGNOSIS — Z8673 Personal history of transient ischemic attack (TIA), and cerebral infarction without residual deficits: Secondary | ICD-10-CM | POA: Diagnosis not present

## 2016-10-08 DIAGNOSIS — N3946 Mixed incontinence: Secondary | ICD-10-CM | POA: Diagnosis not present

## 2016-10-08 DIAGNOSIS — M6281 Muscle weakness (generalized): Secondary | ICD-10-CM | POA: Diagnosis not present

## 2016-10-10 DIAGNOSIS — N189 Chronic kidney disease, unspecified: Secondary | ICD-10-CM | POA: Diagnosis not present

## 2016-10-10 DIAGNOSIS — R600 Localized edema: Secondary | ICD-10-CM | POA: Diagnosis not present

## 2016-10-10 DIAGNOSIS — G9009 Other idiopathic peripheral autonomic neuropathy: Secondary | ICD-10-CM | POA: Diagnosis not present

## 2016-10-10 DIAGNOSIS — I10 Essential (primary) hypertension: Secondary | ICD-10-CM | POA: Diagnosis not present

## 2016-10-10 DIAGNOSIS — G2581 Restless legs syndrome: Secondary | ICD-10-CM | POA: Diagnosis not present

## 2016-10-10 DIAGNOSIS — K219 Gastro-esophageal reflux disease without esophagitis: Secondary | ICD-10-CM | POA: Diagnosis not present

## 2016-10-10 DIAGNOSIS — Z8673 Personal history of transient ischemic attack (TIA), and cerebral infarction without residual deficits: Secondary | ICD-10-CM | POA: Diagnosis not present

## 2016-10-10 DIAGNOSIS — M6281 Muscle weakness (generalized): Secondary | ICD-10-CM | POA: Diagnosis not present

## 2016-10-10 DIAGNOSIS — N3946 Mixed incontinence: Secondary | ICD-10-CM | POA: Diagnosis not present

## 2016-10-12 DIAGNOSIS — M6281 Muscle weakness (generalized): Secondary | ICD-10-CM | POA: Diagnosis not present

## 2016-10-12 DIAGNOSIS — Z8673 Personal history of transient ischemic attack (TIA), and cerebral infarction without residual deficits: Secondary | ICD-10-CM | POA: Diagnosis not present

## 2016-10-12 DIAGNOSIS — N3946 Mixed incontinence: Secondary | ICD-10-CM | POA: Diagnosis not present

## 2016-10-17 DIAGNOSIS — N3946 Mixed incontinence: Secondary | ICD-10-CM | POA: Diagnosis not present

## 2016-10-17 DIAGNOSIS — Z8673 Personal history of transient ischemic attack (TIA), and cerebral infarction without residual deficits: Secondary | ICD-10-CM | POA: Diagnosis not present

## 2016-10-17 DIAGNOSIS — M6281 Muscle weakness (generalized): Secondary | ICD-10-CM | POA: Diagnosis not present

## 2016-10-18 DIAGNOSIS — M6281 Muscle weakness (generalized): Secondary | ICD-10-CM | POA: Diagnosis not present

## 2016-10-18 DIAGNOSIS — Z8673 Personal history of transient ischemic attack (TIA), and cerebral infarction without residual deficits: Secondary | ICD-10-CM | POA: Diagnosis not present

## 2016-10-18 DIAGNOSIS — N3946 Mixed incontinence: Secondary | ICD-10-CM | POA: Diagnosis not present

## 2016-10-21 ENCOUNTER — Encounter: Payer: Self-pay | Admitting: Emergency Medicine

## 2016-10-21 ENCOUNTER — Emergency Department: Payer: PPO

## 2016-10-21 ENCOUNTER — Emergency Department
Admission: EM | Admit: 2016-10-21 | Discharge: 2016-10-21 | Disposition: A | Discharge status: Home / Self Care | Payer: PPO | Attending: Emergency Medicine | Admitting: Emergency Medicine

## 2016-10-21 DIAGNOSIS — I10 Essential (primary) hypertension: Secondary | ICD-10-CM | POA: Insufficient documentation

## 2016-10-21 DIAGNOSIS — R112 Nausea with vomiting, unspecified: Secondary | ICD-10-CM | POA: Diagnosis not present

## 2016-10-21 DIAGNOSIS — R51 Headache: Secondary | ICD-10-CM | POA: Insufficient documentation

## 2016-10-21 DIAGNOSIS — R110 Nausea: Secondary | ICD-10-CM | POA: Diagnosis not present

## 2016-10-21 DIAGNOSIS — Z79899 Other long term (current) drug therapy: Secondary | ICD-10-CM | POA: Diagnosis not present

## 2016-10-21 DIAGNOSIS — N189 Chronic kidney disease, unspecified: Secondary | ICD-10-CM | POA: Diagnosis not present

## 2016-10-21 DIAGNOSIS — Z7982 Long term (current) use of aspirin: Secondary | ICD-10-CM | POA: Diagnosis not present

## 2016-10-21 DIAGNOSIS — Z7902 Long term (current) use of antithrombotics/antiplatelets: Secondary | ICD-10-CM | POA: Diagnosis not present

## 2016-10-21 DIAGNOSIS — R519 Headache, unspecified: Secondary | ICD-10-CM

## 2016-10-21 LAB — TROPONIN I

## 2016-10-21 LAB — CBC
HEMATOCRIT: 34.9 % — AB (ref 35.0–47.0)
Hemoglobin: 11.8 g/dL — ABNORMAL LOW (ref 12.0–16.0)
MCH: 31.4 pg (ref 26.0–34.0)
MCHC: 33.8 g/dL (ref 32.0–36.0)
MCV: 93.1 fL (ref 80.0–100.0)
Platelets: 291 10*3/uL (ref 150–440)
RBC: 3.75 MIL/uL — ABNORMAL LOW (ref 3.80–5.20)
RDW: 14.8 % — AB (ref 11.5–14.5)
WBC: 8.9 10*3/uL (ref 3.6–11.0)

## 2016-10-21 LAB — URINALYSIS, COMPLETE (UACMP) WITH MICROSCOPIC
Bilirubin Urine: NEGATIVE
Glucose, UA: NEGATIVE mg/dL
HGB URINE DIPSTICK: NEGATIVE
Ketones, ur: NEGATIVE mg/dL
Leukocytes, UA: NEGATIVE
NITRITE: POSITIVE — AB
Protein, ur: NEGATIVE mg/dL
SPECIFIC GRAVITY, URINE: 1.005 (ref 1.005–1.030)
pH: 5 (ref 5.0–8.0)

## 2016-10-21 LAB — COMPREHENSIVE METABOLIC PANEL
ALT: 16 U/L (ref 14–54)
ANION GAP: 11 (ref 5–15)
AST: 23 U/L (ref 15–41)
Albumin: 4.2 g/dL (ref 3.5–5.0)
Alkaline Phosphatase: 71 U/L (ref 38–126)
BUN: 19 mg/dL (ref 6–20)
CALCIUM: 9.4 mg/dL (ref 8.9–10.3)
CHLORIDE: 101 mmol/L (ref 101–111)
CO2: 26 mmol/L (ref 22–32)
Creatinine, Ser: 1.11 mg/dL — ABNORMAL HIGH (ref 0.44–1.00)
GFR, EST AFRICAN AMERICAN: 51 mL/min — AB (ref >60)
GFR, EST NON AFRICAN AMERICAN: 44 mL/min — AB (ref >60)
GLUCOSE: 110 mg/dL — AB (ref 65–99)
POTASSIUM: 4.3 mmol/L (ref 3.5–5.1)
SODIUM: 138 mmol/L (ref 135–145)
TOTAL PROTEIN: 8.1 g/dL (ref 6.5–8.1)
Total Bilirubin: 1.1 mg/dL (ref 0.3–1.2)

## 2016-10-21 LAB — LIPASE, BLOOD: LIPASE: 26 U/L (ref 11–51)

## 2016-10-21 MED ORDER — ACETAMINOPHEN 500 MG PO TABS
1000.0000 mg | ORAL_TABLET | Freq: Once | ORAL | Status: AC
  Administered 2016-10-21: 1000 mg via ORAL
  Filled 2016-10-21: qty 2

## 2016-10-21 MED ORDER — ONDANSETRON HCL 4 MG/2ML IJ SOLN
4.0000 mg | Freq: Once | INTRAMUSCULAR | Status: AC
  Administered 2016-10-21: 4 mg via INTRAVENOUS
  Filled 2016-10-21: qty 2

## 2016-10-21 NOTE — ED Notes (Signed)
Called son and left message to call back to see if he is able to transport patient

## 2016-10-21 NOTE — ED Notes (Signed)
Pt off the floor in CT 

## 2016-10-21 NOTE — ED Notes (Signed)
Patients son called and stated that he is out of town right now and is unable to come get patient. He is going to call and see if he can get someone to come pick patient up and call us back.

## 2016-10-21 NOTE — ED Triage Notes (Signed)
Pt brought in by [facility] from [place] for nausea, headache, and "not feeling well." Pt denies any pain at this time just states that she feels nauseated. Pt does not appear to be in any distress at this time.

## 2016-10-21 NOTE — ED Notes (Signed)
Pt resting in room, breathing is equal and unlabored

## 2016-10-21 NOTE — ED Notes (Signed)
Called [facility], they do not have transportation to come get patient.

## 2016-10-21 NOTE — Discharge Instructions (Signed)
Please seek medical attention for any high fevers, chest pain, shortness of breath, change in behavior, persistent vomiting, bloody stool or any other new or concerning symptoms.  

## 2016-10-21 NOTE — ED Provider Notes (Signed)
[facility] Emergency Department Provider Note  ____________________________________________  Time seen: Approximately 1:54 PM  I have reviewed the triage vital signs and the nursing notes.   HISTORY  Chief Complaint Headache and Nausea  Level 5 caveat:  Portions of the history and physical were unable to be obtained due to dementia   HPI Amanda Hogan is a 83 y.o. female with a history of dementia, fibromyalgia, hypertension, CVA on Plavix who presents for evaluation of headache. Patient is a poor historian due to her dementia. She is complaining of a headache located behind her left ear. She doesn't do so felt nauseated and vomited one time. She is unable to describe the quality or intensity of the pain. She also is unable to tell me if she has ever had similar pain before. She denies chest pain, shortness of breath, abdominal pain.  According to the nurse from patient's skilled nursing facility patient today was complaining of a headache, she was more confused than her baseline and had one episode of vomiting which prompted them to call EMS.  Past Medical History:  Diagnosis Date  . Acid reflux   . Angina   . Anxiety 02/23/2012  . Arthritis   . Chest pain    negative lexiscan myoview 53/13, last echo 02/24/12-EF 78-938, mid systolic obliteration of the LV, cavity aortic sclerosis  . Complication of anesthesia    "w/my back; gave me too much; thought I'd had stroke but didn't"  . Dementia 02/23/2012  . Exertional dyspnea   . Fibromyalgia   . Heart murmur   . High cholesterol   . HTN (hypertension)   . Hypertension   . Kidney stone   . Memory disorder, possibly not taking home meds at times 07/22/2011  . Mild carotid artery disease ([place]) 08/05/11   carotid dopplers, mild disease  . Restless leg syndrome   . Sinus malignant neoplasm ([place])   . Stroke [facility])    "said it looked like bullet holes; had a bunch"  . TIA (transient ischemic attack)      Patient Active Problem List   Diagnosis Date Noted  . Acute kidney injury ([place]) 12/27/2014  . Acute CVA (cerebrovascular accident) ([place]) 12/16/2014  . Diastolic dysfunction-grade 2 10/29/2014  . Pulmonary hypertension-moderate 10/29/2014  . Troponin level elevated-suspect demand ischemia from AF with RVR 10/28/2014  . Lymphoma history 10/28/2014  . Acute diastolic CHF (congestive heart failure) ([place]) 10/27/2014  . Atrial fibrillation with RVR (new)   . TIA (transient ischemic attack) 02/25/2012  . Encephalopathy acute 02/23/2012  . Anemia 02/23/2012  . Chronic back pain 02/23/2012  . Anxiety 02/23/2012  . Mild carotid artery disease ([place]) 08/05/2011  . Memory disorder, possibly not taking home meds at times 07/22/2011  . Hypertension, at times poorly controlled 07/21/2011  . Chest pain-low risk Myoview May 2013 07/21/2011  . Hyperlipidemia 07/21/2011  . Gastroesophageal reflux disease 07/21/2011  . History of TIA (transient ischemic attack) 07/21/2011  . Obesity 07/21/2011    Past Surgical History:  Procedure Laterality Date  . BREAST CYST EXCISION     left  . CARPAL TUNNEL RELEASE     right  . CATARACT EXTRACTION W/ INTRAOCULAR LENS  IMPLANT, BILATERAL    . CHOLECYSTECTOMY  2005  . ELBOW BURSA SURGERY     left  . EYE SURGERY  1940   fixed my crossed eyes"  . HERNIA REPAIR  2007   "stomach"  . KIDNEY STONE SURGERY     "  right side; cut it out"  . KNEE CARTILAGE SURGERY     right  . LUMBAR SPINE SURGERY     "put box in my lower back"  . LUNG SURGERY     "made 3 holes and pulled gel out"  . VAGINAL HYSTERECTOMY      Prior to Admission medications   Medication Sig Start Date End Date Taking? Authorizing Provider  acetaminophen (TYLENOL) 500 MG tablet Take 1,000 mg by mouth every 8 (eight) hours.     [provider]  ALPRAZolam Duanne Moron) 0.25 MG tablet Take 1 tablet (0.25 mg total) by mouth daily as needed for anxiety. 01/01/15   Loletha Grayer, MD   amLODipine (NORVASC) 10 MG tablet Take 5 mg by mouth daily.     [provider]  aspirin (ASPIRIN CHILDRENS) 81 MG chewable tablet Chew 81 mg by mouth every morning.    [provider]  atorvastatin (LIPITOR) 40 MG tablet Take 1 tablet by mouth daily. 04/25/15   [provider]  atorvastatin (LIPITOR) 80 MG tablet Take 40 mg by mouth daily. Take one tablet by mouth once daily for cholesterol    [provider]  ciprofloxacin (CIPRO) 250 MG tablet Take 250 mg by mouth 2 (two) times daily.    [provider]  citalopram (CELEXA) 10 MG tablet Take 10 mg by mouth daily.    [provider]  clopidogrel (PLAVIX) 75 MG tablet Take 75 mg by mouth daily.    [provider]  cyanocobalamin 500 MCG tablet Take 500 mcg by mouth daily.    [provider]  estradiol (ESTRACE) 0.1 MG/GM vaginal cream Place 1 g vaginally once a week.    [provider]  furosemide (LASIX) 20 MG tablet Take 20 mg by mouth daily.    [provider]  gabapentin (NEURONTIN) 300 MG capsule Take 200 mg by mouth 3 (three) times daily.     [provider]  guaifenesin (ROBITUSSIN) 100 MG/5ML syrup Take 200 mg by mouth 3 (three) times daily as needed for cough or congestion.    [provider]  loperamide (IMODIUM) 2 MG capsule Take 2 mg by mouth as needed for diarrhea or loose stools. Up to 6 doses in 24 hr as needed    [provider]  memantine (NAMENDA XR) 28 MG CP24 24 hr capsule Take 28 mg by mouth daily.    [provider]  memantine (NAMENDA) 10 MG tablet Take 10 mg by mouth at bedtime.    [provider]  metoprolol succinate (TOPROL-XL) 50 MG 24 hr tablet Take 1 tablet (50 mg total) by mouth daily. Take with or immediately following a meal. 01/01/15   Loletha Grayer, MD  nitroGLYCERIN (NITROSTAT) 0.4 MG SL tablet Place 0.4 mg under the tongue every 5 (five) minutes as needed for chest pain.      [provider]  ondansetron (ZOFRAN) 4 MG tablet Take 4 mg by mouth every 6 (six) hours as needed for nausea or vomiting.    [provider]  pantoprazole (PROTONIX) 40 MG tablet Take 40 mg by mouth 2 (two) times daily.     [provider]  QUEtiapine (SEROQUEL) 25 MG tablet Take 1 tablet (25 mg total) by mouth at bedtime. Patient taking differently: Take 12.5 mg by mouth 2 (two) times daily. Patient is taking at 8am and 6pm 12/19/14   Henreitta Leber, MD  ranitidine (ZANTAC) 150 MG tablet Take 150 mg by mouth  daily.    [provider]  rOPINIRole (REQUIP) 0.5 MG tablet Take 0.5 mg by mouth at bedtime.    [provider]  rOPINIRole (REQUIP) 2 MG tablet Take 2 mg by mouth 3 (three) times daily.     [provider]  sennosides-docusate sodium (SENOKOT-S) 8.6-50 MG tablet Take 2 tablets by mouth daily as needed for constipation.    [provider]  spironolactone (ALDACTONE) 25 MG tablet Take 25 mg by mouth daily.    [provider]  UNABLE TO FIND Med Name: Med Pass 240 cc by mouth three times daily with meals    [provider]    Allergies Iohexol; Codeine sulfate; and Contrast media [iodinated diagnostic agents]  Family History  Problem Relation Age of Onset  . Diabetes Neg Hx     Social History Social History  Substance Use Topics  . Smoking status: Never Smoker  . Smokeless tobacco: Never Used  . Alcohol use No    Review of Systems  Constitutional: Negative for fever. Eyes: Negative for visual changes. ENT: Negative for sore throat. Neck: No neck pain  Cardiovascular: Negative for chest pain. Respiratory: Negative for shortness of breath. Gastrointestinal: Negative for abdominal pain, diarrhea. + N/V Genitourinary: Negative for dysuria. Musculoskeletal: Negative for back pain. Skin: Negative for rash. Neurological: Negative for weakness or numbness. + HA Psych: No SI or  HI  ____________________________________________   PHYSICAL EXAM:  VITAL SIGNS: ED Triage Vitals [10/21/16 1342]  Enc Vitals Group     BP (!) 154/71     Pulse Rate 86     Resp 16     Temp 97.9 F (36.6 C)     Temp Source Oral     SpO2 97 %     Weight      Height      Head Circumference      Peak Flow      Pain Score      Pain Loc      Pain Edu?      Excl. in [place]?     Constitutional: Alert and oriented to self only. Well appearing and in no apparent distress. HEENT:      Head: Normocephalic and atraumatic.         Eyes: Conjunctivae are normal. Sclera is non-icteric.       Mouth/Throat: Mucous membranes are moist.       Ear: TMs visualized and clear      Neck: Supple with no signs of meningismus. Cardiovascular: Regular rate and rhythm. V/VI systolic murmur at the RUSB. 2+ symmetrical distal pulses are present in all extremities. No JVD. Respiratory: Normal respiratory effort. Lungs are clear to auscultation bilaterally. No wheezes, crackles, or rhonchi.  Gastrointestinal: Soft, non tender, and non distended with positive bowel sounds. No rebound or guarding. Musculoskeletal: Nontender with normal range of motion in all extremities. No edema, cyanosis, or erythema of extremities. Neurologic: Normal speech and language. Face is symmetric. Moving all extremities. No gross focal neurologic deficits are appreciated. Skin: Skin is warm, dry and intact. No rash noted.  ____________________________________________   LABS (all labs ordered are listed, but only abnormal results are displayed)  Labs Reviewed  COMPREHENSIVE METABOLIC PANEL - Abnormal; Notable for the following:       Result Value   Glucose, Bld 110 (*)    Creatinine, Ser 1.11 (*)    GFR calc non Af Amer 44 (*)    GFR calc Af Amer 51 (*)  All other components within normal limits  CBC - Abnormal; Notable for the following:    RBC 3.75 (*)    Hemoglobin 11.8 (*)    HCT 34.9 (*)    RDW 14.8 (*)    All  other components within normal limits  LIPASE, BLOOD  TROPONIN I  URINALYSIS, COMPLETE (UACMP) WITH MICROSCOPIC   ____________________________________________  EKG  ED ECG REPORT I, Rudene Re, the attending physician, personally viewed and interpreted this ECG.  Normal sinus rhythm, rate of 80, normal intervals, normal axis, no ST elevations or depressions.  ____________________________________________  RADIOLOGY  Head CT: Negative  ____________________________________________   PROCEDURES  Procedure(s) performed: None Procedures Critical Care performed:  None ____________________________________________   INITIAL IMPRESSION / ASSESSMENT AND PLAN / ED COURSE  83 y.o. female with a history of dementia, fibromyalgia, hypertension, CVA on Plavix who presents for evaluation of headache, nausea, and vomiting. History limited due to dementia. Patient is well appearing and alert, following commands, no neuro deficits. Vitals WNL. Will get head CT to rule out bleed since patient is on Plavix. Will check labs, EKG, UA to evaluate for dehydration, electrolyte abnormalities, ischemia, or infection. Will give zofran, IVF, tylenol.  Clinical Course as of Oct 21 1557  Fri Oct 21, 2016  1558 Labs and head CT with no acute findings. UA pending. Care transferred to Dr. Archie Balboa  [CV]    Clinical Course User Index [CV] Alfred Levins [place], MD    Pertinent labs & imaging results that were available during my care of the patient were reviewed by me and considered in my medical decision making (see chart for details).    ____________________________________________   FINAL CLINICAL IMPRESSION(S) / ED DIAGNOSES  Final diagnoses:  Acute nonintractable headache, unspecified headache type  Non-intractable vomiting with nausea, unspecified vomiting type      NEW MEDICATIONS STARTED DURING THIS VISIT:  New Prescriptions   No medications on file     Note:  This document was  prepared using Dragon voice recognition software and may include unintentional dictation errors.    Alfred Levins, [place], MD 10/21/16 [phone]

## 2016-10-21 NOTE — ED Provider Notes (Signed)
-----------------------------------------   5:24 PM on 10/21/2016 -----------------------------------------  Patient urine is positive for nitrite. No WBCs, Luekocytes. Patient denies any dysuria, bad odor or change in frequency of urination to me. She does state that her headache is gone. Will plan on sending urine for culture prior to initiation of antibiotics. Patient was agreeable with plan.   Nance Pear, MD 10/21/16 [phone]

## 2016-10-23 LAB — URINE CULTURE

## 2016-10-24 DIAGNOSIS — M6281 Muscle weakness (generalized): Secondary | ICD-10-CM | POA: Diagnosis not present

## 2016-10-24 DIAGNOSIS — N3946 Mixed incontinence: Secondary | ICD-10-CM | POA: Diagnosis not present

## 2016-10-24 DIAGNOSIS — K219 Gastro-esophageal reflux disease without esophagitis: Secondary | ICD-10-CM | POA: Diagnosis not present

## 2016-10-24 DIAGNOSIS — R51 Headache: Secondary | ICD-10-CM | POA: Diagnosis not present

## 2016-10-24 DIAGNOSIS — I10 Essential (primary) hypertension: Secondary | ICD-10-CM | POA: Diagnosis not present

## 2016-10-24 DIAGNOSIS — R609 Edema, unspecified: Secondary | ICD-10-CM | POA: Diagnosis not present

## 2016-10-24 DIAGNOSIS — Z8673 Personal history of transient ischemic attack (TIA), and cerebral infarction without residual deficits: Secondary | ICD-10-CM | POA: Diagnosis not present

## 2016-10-24 DIAGNOSIS — R8290 Unspecified abnormal findings in urine: Secondary | ICD-10-CM | POA: Diagnosis not present

## 2016-10-25 DIAGNOSIS — Z79899 Other long term (current) drug therapy: Secondary | ICD-10-CM | POA: Diagnosis not present

## 2016-10-25 NOTE — Progress Notes (Signed)
PHARMACY CONSULT NOTE - INITIAL   Pharmacy Consult for ED Culture Results  Allergies  Allergen Reactions  . Iohexol Shortness Of Breath and Itching  . Codeine Sulfate Other (See Comments)    Unknown reaction.  . Contrast Media [Iodinated Diagnostic Agents] Other (See Comments)    Reaction:  Unknown      Microbiology: Recent Results (from the past 720 hour(s))  Urine Culture     Status: Abnormal   Collection Time: 10/21/16  4:23 PM  Result Value Ref Range Status   Specimen Description URINE, RANDOM  Final   Special Requests NONE  Final   Culture >=100,000 COLONIES/mL KLEBSIELLA PNEUMONIAE (A)  Final   Report Status 10/23/2016 FINAL  Final   Organism ID, Bacteria KLEBSIELLA PNEUMONIAE (A)  Final      Susceptibility   Klebsiella pneumoniae - MIC*    AMPICILLIN RESISTANT Resistant     CEFAZOLIN <=4 SENSITIVE Sensitive     CEFTRIAXONE <=1 SENSITIVE Sensitive     CIPROFLOXACIN <=0.25 SENSITIVE Sensitive     GENTAMICIN <=1 SENSITIVE Sensitive     IMIPENEM <=0.25 SENSITIVE Sensitive     NITROFURANTOIN 64 INTERMEDIATE Intermediate     TRIMETH/SULFA <=20 SENSITIVE Sensitive     AMPICILLIN/SULBACTAM <=2 SENSITIVE Sensitive     PIP/TAZO <=4 SENSITIVE Sensitive     Extended ESBL NEGATIVE Sensitive     * >=100,000 COLONIES/mL KLEBSIELLA PNEUMONIAE     Assessment: 83 yo female seen in ED with headache and N/V. Urine analysis was done and sent for culture and sensitivity testing. Patient was not discharged on any antibiotics.  UCx now showing >100,000 Klebsiella pneumoniae.   Plan:  8/6 After discussion with Dr. Kerman Passey, patient will received Keflex 500mg  TID x 7 days. Patient is a resident at [facility]. Multiple attempts were made to reach nursing staff at [facility] on 8/6 without any success.   8/7 Spoke with nurse Threasa Beards at [facility] at [place] on 8/7.  UCx results and antibiotic plan were discussed and nurse verbalized understanding. Per Melaine's  request -orders were faxed to [place] at [phone] for Ms. Bermea.   Fax confirmation was received.    Pernell Dupre, PharmD, BCPS Clinical Pharmacist 10/25/2016 1:48 PM

## 2016-10-31 DIAGNOSIS — I10 Essential (primary) hypertension: Secondary | ICD-10-CM | POA: Diagnosis not present

## 2016-10-31 DIAGNOSIS — M542 Cervicalgia: Secondary | ICD-10-CM | POA: Diagnosis not present

## 2016-10-31 DIAGNOSIS — N390 Urinary tract infection, site not specified: Secondary | ICD-10-CM | POA: Diagnosis not present

## 2016-10-31 DIAGNOSIS — R51 Headache: Secondary | ICD-10-CM | POA: Diagnosis not present

## 2016-10-31 DIAGNOSIS — K210 Gastro-esophageal reflux disease with esophagitis: Secondary | ICD-10-CM | POA: Diagnosis not present

## 2016-11-22 DIAGNOSIS — M6281 Muscle weakness (generalized): Secondary | ICD-10-CM | POA: Diagnosis not present

## 2016-11-22 DIAGNOSIS — Z8673 Personal history of transient ischemic attack (TIA), and cerebral infarction without residual deficits: Secondary | ICD-10-CM | POA: Diagnosis not present

## 2016-11-22 DIAGNOSIS — N3946 Mixed incontinence: Secondary | ICD-10-CM | POA: Diagnosis not present

## 2016-11-23 DIAGNOSIS — Z79899 Other long term (current) drug therapy: Secondary | ICD-10-CM | POA: Diagnosis not present

## 2016-11-23 DIAGNOSIS — M50323 Other cervical disc degeneration at C6-C7 level: Secondary | ICD-10-CM | POA: Diagnosis not present

## 2016-11-23 DIAGNOSIS — B3789 Other sites of candidiasis: Secondary | ICD-10-CM | POA: Diagnosis not present

## 2016-11-23 DIAGNOSIS — M6281 Muscle weakness (generalized): Secondary | ICD-10-CM | POA: Diagnosis not present

## 2016-11-23 DIAGNOSIS — K219 Gastro-esophageal reflux disease without esophagitis: Secondary | ICD-10-CM | POA: Diagnosis not present

## 2016-11-23 DIAGNOSIS — S20111D Abrasion of breast, right breast, subsequent encounter: Secondary | ICD-10-CM | POA: Diagnosis not present

## 2016-11-23 DIAGNOSIS — M50322 Other cervical disc degeneration at C5-C6 level: Secondary | ICD-10-CM | POA: Diagnosis not present

## 2016-11-23 DIAGNOSIS — R51 Headache: Secondary | ICD-10-CM | POA: Diagnosis not present

## 2016-12-05 DIAGNOSIS — I10 Essential (primary) hypertension: Secondary | ICD-10-CM | POA: Diagnosis not present

## 2016-12-05 DIAGNOSIS — R4182 Altered mental status, unspecified: Secondary | ICD-10-CM | POA: Diagnosis not present

## 2016-12-05 DIAGNOSIS — G308 Other Alzheimer's disease: Secondary | ICD-10-CM | POA: Diagnosis not present

## 2016-12-05 DIAGNOSIS — G2581 Restless legs syndrome: Secondary | ICD-10-CM | POA: Diagnosis not present

## 2016-12-05 DIAGNOSIS — G4709 Other insomnia: Secondary | ICD-10-CM | POA: Diagnosis not present

## 2016-12-09 ENCOUNTER — Emergency Department: Payer: PPO

## 2016-12-09 ENCOUNTER — Emergency Department
Admission: EM | Admit: 2016-12-09 | Discharge: 2016-12-09 | Disposition: A | Discharge status: Nursing Home (Includes ICF) | Payer: PPO | Attending: Emergency Medicine | Admitting: Emergency Medicine

## 2016-12-09 DIAGNOSIS — S0990XA Unspecified injury of head, initial encounter: Secondary | ICD-10-CM | POA: Diagnosis not present

## 2016-12-09 DIAGNOSIS — I480 Paroxysmal atrial fibrillation: Secondary | ICD-10-CM | POA: Diagnosis not present

## 2016-12-09 DIAGNOSIS — Z7982 Long term (current) use of aspirin: Secondary | ICD-10-CM | POA: Diagnosis not present

## 2016-12-09 DIAGNOSIS — S199XXA Unspecified injury of neck, initial encounter: Secondary | ICD-10-CM | POA: Diagnosis not present

## 2016-12-09 DIAGNOSIS — Z8673 Personal history of transient ischemic attack (TIA), and cerebral infarction without residual deficits: Secondary | ICD-10-CM | POA: Insufficient documentation

## 2016-12-09 DIAGNOSIS — R51 Headache: Secondary | ICD-10-CM | POA: Diagnosis not present

## 2016-12-09 DIAGNOSIS — E860 Dehydration: Secondary | ICD-10-CM | POA: Diagnosis not present

## 2016-12-09 DIAGNOSIS — N390 Urinary tract infection, site not specified: Secondary | ICD-10-CM | POA: Diagnosis not present

## 2016-12-09 DIAGNOSIS — I130 Hypertensive heart and chronic kidney disease with heart failure and stage 1 through stage 4 chronic kidney disease, or unspecified chronic kidney disease: Secondary | ICD-10-CM | POA: Insufficient documentation

## 2016-12-09 DIAGNOSIS — N189 Chronic kidney disease, unspecified: Secondary | ICD-10-CM | POA: Diagnosis not present

## 2016-12-09 DIAGNOSIS — R531 Weakness: Secondary | ICD-10-CM | POA: Insufficient documentation

## 2016-12-09 DIAGNOSIS — R4182 Altered mental status, unspecified: Secondary | ICD-10-CM | POA: Diagnosis present

## 2016-12-09 DIAGNOSIS — Z7902 Long term (current) use of antithrombotics/antiplatelets: Secondary | ICD-10-CM | POA: Diagnosis not present

## 2016-12-09 DIAGNOSIS — I2510 Atherosclerotic heart disease of native coronary artery without angina pectoris: Secondary | ICD-10-CM | POA: Insufficient documentation

## 2016-12-09 DIAGNOSIS — I5031 Acute diastolic (congestive) heart failure: Secondary | ICD-10-CM | POA: Insufficient documentation

## 2016-12-09 DIAGNOSIS — I129 Hypertensive chronic kidney disease with stage 1 through stage 4 chronic kidney disease, or unspecified chronic kidney disease: Secondary | ICD-10-CM | POA: Diagnosis not present

## 2016-12-09 DIAGNOSIS — Z79899 Other long term (current) drug therapy: Secondary | ICD-10-CM | POA: Insufficient documentation

## 2016-12-09 DIAGNOSIS — M542 Cervicalgia: Secondary | ICD-10-CM | POA: Diagnosis not present

## 2016-12-09 LAB — CBC
HCT: 32.5 % — ABNORMAL LOW (ref 35.0–47.0)
HEMOGLOBIN: 11.1 g/dL — AB (ref 12.0–16.0)
MCH: 32 pg (ref 26.0–34.0)
MCHC: 34.1 g/dL (ref 32.0–36.0)
MCV: 94.1 fL (ref 80.0–100.0)
Platelets: 263 10*3/uL (ref 150–440)
RBC: 3.45 MIL/uL — AB (ref 3.80–5.20)
RDW: 14.3 % (ref 11.5–14.5)
WBC: 6.6 10*3/uL (ref 3.6–11.0)

## 2016-12-09 LAB — COMPREHENSIVE METABOLIC PANEL
ALT: 27 U/L (ref 14–54)
ANION GAP: 8 (ref 5–15)
AST: 36 U/L (ref 15–41)
Albumin: 3.9 g/dL (ref 3.5–5.0)
Alkaline Phosphatase: 77 U/L (ref 38–126)
BUN: 40 mg/dL — ABNORMAL HIGH (ref 6–20)
CHLORIDE: 105 mmol/L (ref 101–111)
CO2: 24 mmol/L (ref 22–32)
Calcium: 8.8 mg/dL — ABNORMAL LOW (ref 8.9–10.3)
Creatinine, Ser: 1.53 mg/dL — ABNORMAL HIGH (ref 0.44–1.00)
GFR calc non Af Amer: 30 mL/min — ABNORMAL LOW (ref >60)
GFR, EST AFRICAN AMERICAN: 35 mL/min — AB (ref >60)
Glucose, Bld: 136 mg/dL — ABNORMAL HIGH (ref 65–99)
POTASSIUM: 4.3 mmol/L (ref 3.5–5.1)
SODIUM: 137 mmol/L (ref 135–145)
Total Bilirubin: 0.8 mg/dL (ref 0.3–1.2)
Total Protein: 7.5 g/dL (ref 6.5–8.1)

## 2016-12-09 LAB — URINALYSIS, COMPLETE (UACMP) WITH MICROSCOPIC
Bilirubin Urine: NEGATIVE
GLUCOSE, UA: NEGATIVE mg/dL
Ketones, ur: NEGATIVE mg/dL
LEUKOCYTES UA: NEGATIVE
NITRITE: NEGATIVE
PH: 5 (ref 5.0–8.0)
Protein, ur: NEGATIVE mg/dL
SPECIFIC GRAVITY, URINE: 1.013 (ref 1.005–1.030)

## 2016-12-09 MED ORDER — CEPHALEXIN 500 MG PO CAPS: 500.0000 mg | ORAL_CAPSULE | Freq: Two times a day (BID) | ORAL | 0 refills | Status: DC

## 2016-12-09 MED ORDER — CEFTRIAXONE SODIUM IN DEXTROSE 20 MG/ML IV SOLN
1.0000 g | Freq: Once | INTRAVENOUS | Status: AC
Start: 2016-12-09 — End: 2016-12-09
  Administered 2016-12-09: 1 g via INTRAVENOUS
  Filled 2016-12-09: qty 50

## 2016-12-09 MED ORDER — SODIUM CHLORIDE 0.9 % IV BOLUS (SEPSIS)
500.0000 mL | Freq: Once | INTRAVENOUS | Status: AC
Start: 2016-12-09 — End: 2016-12-09
  Administered 2016-12-09: 500 mL via INTRAVENOUS

## 2016-12-09 NOTE — ED Notes (Signed)
Called and Spoke with Jackson at [facility], they will send someone to come pick her up.

## 2016-12-09 NOTE — ED Notes (Signed)
Patient not able to sign for discharge due to mental status. Report Called to [place] at [facility].

## 2016-12-09 NOTE — ED Triage Notes (Signed)
Patient had unwitnessed fall at facility. Pt on both plavix and aspirin. Pt is altered per facility. Patient confused at baseline, however she is worse than normal. [facility].

## 2016-12-09 NOTE — ED Notes (Signed)
Pt transported to CT ?

## 2016-12-09 NOTE — ED Provider Notes (Signed)
[facility] Emergency Department Provider Note  ____________________________________________  Time seen: Approximately 10:09 AM  I have reviewed the triage vital signs and the nursing notes.   HISTORY  Chief Complaint Altered Mental Status and Fall  Level 5 Caveat: Portions of the History and Physical were unable to be obtained due to altered mental status related to chronic dementia.   HPI Amanda Hogan is a 83 y.o. female sent to the ED due to unwitnessed fall. She is on aspirin and Plavix, she has baseline confusion and dementia, but her nursing facility felt that she is more confused than normal. No other reported symptoms. Patient denies any complaints.     Past Medical History:  Diagnosis Date  . Acid reflux   . Angina   . Anxiety 02/23/2012  . Arthritis   . Chest pain    negative lexiscan myoview 53/13, last echo 02/24/12-EF 30-865, mid systolic obliteration of the LV, cavity aortic sclerosis  . Complication of anesthesia    "w/my back; gave me too much; thought I'd had stroke but didn't"  . Dementia 02/23/2012  . Exertional dyspnea   . Fibromyalgia   . Heart murmur   . High cholesterol   . HTN (hypertension)   . Hypertension   . Kidney stone   . Memory disorder, possibly not taking home meds at times 07/22/2011  . Mild carotid artery disease ([place]) 08/05/11   carotid dopplers, mild disease  . Restless leg syndrome   . 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 Stroke [facility])    "said it looked like bullet holes; had a bunch"  . TIA (transient ischemic attack)      Patient Active Problem List   Diagnosis Date Noted  . Acute kidney injury ([place]) 12/27/2014  . Acute CVA (cerebrovascular accident) ([place]) 12/16/2014  . Diastolic dysfunction-grade 2 10/29/2014  . Pulmonary hypertension-moderate 10/29/2014  . Troponin level elevated-suspect demand ischemia from AF with RVR 10/28/2014  . Lymphoma history 10/28/2014  . Acute diastolic CHF (congestive  heart failure) ([place]) 10/27/2014  . Atrial fibrillation with RVR (new)   . TIA (transient ischemic attack) 02/25/2012  . Encephalopathy acute 02/23/2012  . Anemia 02/23/2012  . Chronic back pain 02/23/2012  . Anxiety 02/23/2012  . Mild carotid artery disease ([place]) 08/05/2011  . Memory disorder, possibly not taking home meds at times 07/22/2011  . Hypertension, at times poorly controlled 07/21/2011  . Chest pain-low risk Myoview May 2013 07/21/2011  . Hyperlipidemia 07/21/2011  . Gastroesophageal reflux disease 07/21/2011  . History of TIA (transient ischemic attack) 07/21/2011  . Obesity 07/21/2011     Past Surgical History:  Procedure Laterality Date  . BREAST CYST EXCISION     left  . CARPAL TUNNEL RELEASE     right  . CATARACT EXTRACTION W/ INTRAOCULAR LENS  IMPLANT, BILATERAL    . CHOLECYSTECTOMY  2005  . ELBOW BURSA SURGERY     left  . EYE SURGERY  1940   fixed my crossed eyes"  . HERNIA REPAIR  2007   "stomach"  . KIDNEY STONE SURGERY     "right side; cut it out"  . KNEE CARTILAGE SURGERY     right  . LUMBAR SPINE SURGERY     "put box in my lower back"  . LUNG SURGERY     "made 3 holes and pulled gel out"  . VAGINAL HYSTERECTOMY       Prior to Admission medications   Medication Sig Start Date End Date  Taking? Authorizing Provider  acetaminophen (TYLENOL) 500 MG tablet Take 1,000 mg by mouth every 8 (eight) hours.     [provider]  ALPRAZolam Duanne Moron) 0.25 MG tablet Take 1 tablet (0.25 mg total) by mouth daily as needed for anxiety. 01/01/15   Loletha Grayer, MD  aspirin (ASPIRIN CHILDRENS) 81 MG chewable tablet Chew 81 mg by mouth every morning.    [provider]  atorvastatin (LIPITOR) 40 MG tablet Take 1 tablet by mouth daily. 04/25/15   [provider]  Calcium Carbonate Antacid (TUMS PO) Take 1 tablet by mouth as needed.    [provider]  cephALEXin (KEFLEX) 500 MG capsule Take 1 capsule (500 mg total) by mouth 2  (two) times daily. 12/09/16   Carrie Mew, MD  citalopram (CELEXA) 10 MG tablet Take 10 mg by mouth daily.    [provider]  clopidogrel (PLAVIX) 75 MG tablet Take 75 mg by mouth daily.    [provider]  cyanocobalamin 500 MCG tablet Take 1,000 mcg by mouth daily.     [provider]  estradiol (ESTRACE) 0.1 MG/GM vaginal cream Place 1 g vaginally once a week.    [provider]  furosemide (LASIX) 20 MG tablet Take 20 mg by mouth daily.    [provider]  gabapentin (NEURONTIN) 100 MG capsule Take 200 mg by mouth 3 (three) times daily.     [provider]  guaifenesin (ROBITUSSIN) 100 MG/5ML syrup Take 200 mg by mouth 3 (three) times daily as needed for cough or congestion.    [provider]  loperamide (IMODIUM) 2 MG capsule Take 2 mg by mouth as needed for diarrhea or loose stools. Up to 6 doses in 24 hr as needed    [provider]  memantine (NAMENDA) 10 MG tablet Take 10 mg by mouth at bedtime.    [provider]  Menthol-Methyl Salicylate (ICY HOT EXTRA STRENGTH) 10-30 % CREA Apply 1 application topically 3 (three) times daily as needed.    [provider]  metoprolol succinate (TOPROL-XL) 50 MG 24 hr tablet Take 1 tablet (50 mg total) by mouth daily. Take with or immediately following a meal. 01/01/15   Loletha Grayer, MD  nitroGLYCERIN (NITROSTAT) 0.4 MG SL tablet Place 0.4 mg under the tongue every 5 (five) minutes as needed for chest pain.     [provider]  nystatin (NYSTATIN) powder Apply 1 Bottle topically 4 (four) times daily.    [provider]  ondansetron (ZOFRAN) 4 MG tablet Take 4 mg by mouth every 6 (six) hours as needed for nausea or vomiting.    [provider]  QUEtiapine (SEROQUEL) 25 MG tablet Take 1 tablet (25 mg total) by mouth at bedtime. Patient taking differently: Take 12.5 mg by mouth 2 (two) times daily. Patient is taking at 8am and 6pm  12/19/14   Henreitta Leber, MD  ranitidine (ZANTAC) 150 MG tablet Take 150 mg by mouth 2 (two) times daily.     [provider]  rOPINIRole (REQUIP) 0.5 MG tablet Take 0.5 mg by mouth at bedtime.    [provider]  rOPINIRole (REQUIP) 2 MG tablet Take 2 mg by mouth 3 (three) times daily.     [provider]  sennosides-docusate sodium (SENOKOT-S) 8.6-50 MG tablet Take 2 tablets by mouth daily as needed for constipation.    [provider]  spironolactone (ALDACTONE) 25 MG tablet Take 25 mg by mouth daily.  [provider]     Allergies Iohexol; Codeine sulfate; and Contrast media [iodinated diagnostic agents]   Family History  Problem Relation Age of Onset  . Diabetes Neg Hx     Social History Social History  Substance Use Topics  . Smoking status: Never Smoker  . Smokeless tobacco: Never Used  . Alcohol use No    Review of Systems Unable to reliably obtained due to altered mental status. ____________________________________________   PHYSICAL EXAM:  VITAL SIGNS: ED Triage Vitals  Enc Vitals Group     BP 12/09/16 0655 (!) 129/53     Pulse Rate 12/09/16 0655 61     Resp 12/09/16 0655 18     Temp 12/09/16 0655 98.7 F (37.1 C)     Temp Source 12/09/16 0655 Oral     SpO2 12/09/16 0655 94 %     Weight 12/09/16 0656 200 lb (90.7 kg)     Height --      Head Circumference --      Peak Flow --      Pain Score --      Pain Loc --      Pain Edu? --      Excl. in [place]? --     Vital signs reviewed, nursing assessments reviewed.   Constitutional:   Alert and oriented To self. Well appearing and in no distress. Eyes:   No scleral icterus.  EOMI. No nystagmus. No conjunctival pallor. PERRL. ENT   Head:   Normocephalic and atraumatic.   Nose:   No congestion/rhinnorhea.    Mouth/Throat:   Dry mucous membranes, no pharyngeal erythema. No peritonsillar mass.    Neck:   No meningismus. Full  ROM Hematological/Lymphatic/Immunilogical:   No cervical lymphadenopathy. Cardiovascular:   RRR. Symmetric bilateral radial and DP pulses.  No murmurs.  Respiratory:   Normal respiratory effort without tachypnea/retractions. Breath sounds are clear and equal bilaterally. No wheezes/rales/rhonchi. Gastrointestinal:   Soft and nontender. Non distended. There is no CVA tenderness.  No rebound, rigidity, or guarding. Genitourinary:   deferred Musculoskeletal:   Normal range of motion in all extremities. No joint effusions.  No lower extremity tenderness.  No edema. Neurologic:   Normal speech.  Motor grossly intact. No gross focal neurologic deficits are appreciated.  Skin:    Skin is warm, dry and intact. No rash noted.  No petechiae, purpura, or bullae.  ____________________________________________    LABS (pertinent positives/negatives) (all labs ordered are listed, but only abnormal results are displayed) Labs Reviewed  COMPREHENSIVE METABOLIC PANEL - Abnormal; Notable for the following:       Result Value   Glucose, Bld 136 (*)    BUN 40 (*)    Creatinine, Ser 1.53 (*)    Calcium 8.8 (*)    GFR calc non Af Amer 30 (*)    GFR calc Af Amer 35 (*)    All other components within normal limits  CBC - Abnormal; Notable for the following:    RBC 3.45 (*)    Hemoglobin 11.1 (*)    HCT 32.5 (*)    All other components within normal limits  URINALYSIS, COMPLETE (UACMP) WITH MICROSCOPIC - Abnormal; Notable for the following:    Color, Urine YELLOW (*)    APPearance HAZY (*)    Hgb urine dipstick LARGE (*)    Bacteria, UA MANY (*)    Squamous Epithelial / LPF 0-5 (*)    All other components within normal limits  URINE CULTURE  ____________________________________________   EKG  Interpreted by me  Date: 12/09/2016  Rate: 62  Rhythm: normal sinus rhythm  QRS Axis: normal  Intervals: normal  ST/T Wave abnormalities: normal  Conduction Disutrbances: none  Narrative  Interpretation: unremarkable      ____________________________________________    RADIOLOGY  Ct Head Wo Contrast  Result Date: 12/09/2016 CLINICAL DATA:  Pain following fall EXAM: CT HEAD WITHOUT CONTRAST CT CERVICAL SPINE WITHOUT CONTRAST TECHNIQUE: Multidetector CT imaging of the head and cervical spine was performed following the standard protocol without intravenous contrast. Multiplanar CT image reconstructions of the cervical spine were also generated. COMPARISON:  Head CT October 21, 2016; cervical spine CT June 19, 2015 FINDINGS: CT HEAD Brain: There is mild to moderate diffuse atrophy. There is no intracranial mass, hemorrhage, extra-axial fluid collection, or midline shift. There are prior infarcts at the right frontal- temporal lobe as well as in the mid to posterior right temporal lobe, stable. There is small vessel disease throughout the centra semiovale bilaterally. There is evidence of prior small infarcts in the posterior limb and genu of the left internal capsule and in the left anterior lentiform nucleus. Small vessel disease in the midbrain regions is stable. No new gray-white compartment lesion is evident. No acute infarct apparent. Vascular: No hyperdense vessel. There is calcification in both carotid siphon regions. There is also calcification in the distal vertebral artery on the left. Skull: There is frontal hyperostosis bilaterally. Bony calvarium appears intact. Sinuses/Orbits: There is mucosal thickening in multiple ethmoid air cells bilaterally. There is mild mucosal thickening in the lateral right sphenoid sinus. Other visualized paranasal sinuses are clear. Frontal sinuses are hypoplastic. Visualized orbits appear symmetric bilaterally. Other: Mastoid air cells are clear. CT CERVICAL SPINE FINDINGS Alignment: There is 2 mm of anterolisthesis of C3 on C4. There is 1 mm of anterolisthesis of C5 on C6. There is 1 mm of anterolisthesis of C7 on T1. Skull base and vertebrae:  Skull base and craniocervical junction regions appear normal. There is pannus posterior to the odontoid which is not causing appreciable impression on the craniocervical junction, stable. Several small areas of calcification noted superior to the odontoid are stable. There is no evident fracture. There are no blastic or lytic bone lesions. Soft tissues and spinal canal: Prevertebral soft tissues and predental space regions are normal. There is no paraspinous lesion. There is no evident cord or canal hematoma. Disc levels: There is moderately severe disc space narrowing at C5-6 and C6-7. There is moderately severe disc space narrowing at several upper thoracic levels as well. There is facet hypertrophy at multiple levels bilaterally. There is exit foraminal narrowing due to bony hypertrophy, severe, at C3-4 bilaterally, at C4-5 on the right, at C5-6 on the right, and to a lesser extent at C6-7 on the right. There is impression on exiting nerve roots at these levels, most severe at C3-4 bilaterally and at C4-5 on the right. There is no frank disc extrusion or high-grade stenosis. Upper chest: Visualized upper lung zones are clear. There is aortic atherosclerosis. Other: There are multiple small nodular lesions in the thyroid. There are foci of carotid artery calcification bilaterally. IMPRESSION: CT head: Stable atrophy with prior infarcts involving portions of the right frontal and temporal lobes as well as small vessel disease at multiple sites in the supratentorial white matter. Lacunar infarcts in the left basal ganglia region particular appear stable. There is also small vessel disease in the midbrain regions. No acute infarct evident. No intracranial mass,  hemorrhage, or extra-axial fluid collection. Foci of arterial vascular calcification noted. Areas of paranasal sinus disease noted. CT cervical spine: 1. No fracture. Areas of mild spondylolisthesis are felt to be due to underlying spondylosis. 2. Multilevel  osteoarthritic change. Note that there is impression on exiting nerve roots at several levels due to bony hypertrophy. No frank disc extrusion or stenosis evident. 3. Aortic atherosclerosis. Foci of carotid artery calcification bilaterally. 4.  Multinodular goiter without dominant thyroid mass. Aortic Atherosclerosis (ICD10-I70.0). Electronically Signed   By: Lowella Grip III M.D.   On: 12/09/2016 07:53   Ct Cervical Spine Wo Contrast  Result Date: 12/09/2016 CLINICAL DATA:  Pain following fall EXAM: CT HEAD WITHOUT CONTRAST CT CERVICAL SPINE WITHOUT CONTRAST TECHNIQUE: Multidetector CT imaging of the head and cervical spine was performed following the standard protocol without intravenous contrast. Multiplanar CT image reconstructions of the cervical spine were also generated. COMPARISON:  Head CT October 21, 2016; cervical spine CT June 19, 2015 FINDINGS: CT HEAD Brain: There is mild to moderate diffuse atrophy. There is no intracranial mass, hemorrhage, extra-axial fluid collection, or midline shift. There are prior infarcts at the right frontal- temporal lobe as well as in the mid to posterior right temporal lobe, stable. There is small vessel disease throughout the centra semiovale bilaterally. There is evidence of prior small infarcts in the posterior limb and genu of the left internal capsule and in the left anterior lentiform nucleus. Small vessel disease in the midbrain regions is stable. No new gray-white compartment lesion is evident. No acute infarct apparent. Vascular: No hyperdense vessel. There is calcification in both carotid siphon regions. There is also calcification in the distal vertebral artery on the left. Skull: There is frontal hyperostosis bilaterally. Bony calvarium appears intact. Sinuses/Orbits: There is mucosal thickening in multiple ethmoid air cells bilaterally. There is mild mucosal thickening in the lateral right sphenoid sinus. Other visualized paranasal sinuses are clear.  Frontal sinuses are hypoplastic. Visualized orbits appear symmetric bilaterally. Other: Mastoid air cells are clear. CT CERVICAL SPINE FINDINGS Alignment: There is 2 mm of anterolisthesis of C3 on C4. There is 1 mm of anterolisthesis of C5 on C6. There is 1 mm of anterolisthesis of C7 on T1. Skull base and vertebrae: Skull base and craniocervical junction regions appear normal. There is pannus posterior to the odontoid which is not causing appreciable impression on the craniocervical junction, stable. Several small areas of calcification noted superior to the odontoid are stable. There is no evident fracture. There are no blastic or lytic bone lesions. Soft tissues and spinal canal: Prevertebral soft tissues and predental space regions are normal. There is no paraspinous lesion. There is no evident cord or canal hematoma. Disc levels: There is moderately severe disc space narrowing at C5-6 and C6-7. There is moderately severe disc space narrowing at several upper thoracic levels as well. There is facet hypertrophy at multiple levels bilaterally. There is exit foraminal narrowing due to bony hypertrophy, severe, at C3-4 bilaterally, at C4-5 on the right, at C5-6 on the right, and to a lesser extent at C6-7 on the right. There is impression on exiting nerve roots at these levels, most severe at C3-4 bilaterally and at C4-5 on the right. There is no frank disc extrusion or high-grade stenosis. Upper chest: Visualized upper lung zones are clear. There is aortic atherosclerosis. Other: There are multiple small nodular lesions in the thyroid. There are foci of carotid artery calcification bilaterally. IMPRESSION: CT head: Stable atrophy with prior infarcts involving  portions of the right frontal and temporal lobes as well as small vessel disease at multiple sites in the supratentorial white matter. Lacunar infarcts in the left basal ganglia region particular appear stable. There is also small vessel disease in the  midbrain regions. No acute infarct evident. No intracranial mass, hemorrhage, or extra-axial fluid collection. Foci of arterial vascular calcification noted. Areas of paranasal sinus disease noted. CT cervical spine: 1. No fracture. Areas of mild spondylolisthesis are felt to be due to underlying spondylosis. 2. Multilevel osteoarthritic change. Note that there is impression on exiting nerve roots at several levels due to bony hypertrophy. No frank disc extrusion or stenosis evident. 3. Aortic atherosclerosis. Foci of carotid artery calcification bilaterally. 4.  Multinodular goiter without dominant thyroid mass. Aortic Atherosclerosis (ICD10-I70.0). Electronically Signed   By: Lowella Grip III M.D.   On: 12/09/2016 07:53    ____________________________________________   PROCEDURES Procedures  ____________________________________________   INITIAL IMPRESSION / ASSESSMENT AND PLAN / ED COURSE  Pertinent labs & imaging results that were available during my care of the patient were reviewed by me and considered in my medical decision making (see chart for details).  Patient well appearing no acute distress, sent for generalized weakness/worsening of her chronic confusion. CT head and neck is negative for any significant trauma. Labs reveal a urinary tract infection. Urine culture sent. IV ceftriaxone given, all continue the patient on oral Keflex. She did have a previously positive urine culture which showed Klebsiella which was broadly sensitive to beta lactams. Doubt meningitis encephalitis ACS PE dissection AAA cholecystitis and appendicitis without perforation or obstruction. No evidence of soft tissue infection.      ____________________________________________   FINAL CLINICAL IMPRESSION(S) / ED DIAGNOSES  Final diagnoses:  Generalized weakness  Lower urinary tract infectious disease  Dehydration  Chronic kidney disease, unspecified CKD stage      New Prescriptions    CEPHALEXIN (KEFLEX) 500 MG CAPSULE    Take 1 capsule (500 mg total) by mouth 2 (two) times daily.     Portions of this note were generated with dragon dictation software. Dictation errors may occur despite best attempts at proofreading.    Carrie Mew, MD 12/09/16 1011

## 2016-12-09 NOTE — ED Notes (Signed)
Pt. Trying to get out of bed, placed bed alarm

## 2016-12-09 NOTE — Discharge Instructions (Signed)
Your tests today reveal a urinary tract infection. We gave you a dose of antibiotics in the ER. Continue taking Keflex as prescribed to treat this infection and follow up with your doctor in the next few days for continued monitoring of your symptoms.

## 2016-12-09 NOTE — ED Notes (Signed)
Brain from [place] came to pick patient up. Pt transported out to [facility] in wheelchair by Estill Bamberg, Therapist, sports.

## 2016-12-11 LAB — URINE CULTURE: Culture: >=100000 — AB

## 2016-12-12 DIAGNOSIS — G4709 Other insomnia: Secondary | ICD-10-CM | POA: Diagnosis not present

## 2016-12-12 DIAGNOSIS — F05 Delirium due to known physiological condition: Secondary | ICD-10-CM | POA: Diagnosis not present

## 2016-12-12 DIAGNOSIS — N189 Chronic kidney disease, unspecified: Secondary | ICD-10-CM | POA: Diagnosis not present

## 2016-12-12 DIAGNOSIS — E860 Dehydration: Secondary | ICD-10-CM | POA: Diagnosis not present

## 2016-12-12 DIAGNOSIS — N178 Other acute kidney failure: Secondary | ICD-10-CM | POA: Diagnosis not present

## 2016-12-12 DIAGNOSIS — N390 Urinary tract infection, site not specified: Secondary | ICD-10-CM | POA: Diagnosis not present

## 2016-12-12 DIAGNOSIS — R2689 Other abnormalities of gait and mobility: Secondary | ICD-10-CM | POA: Diagnosis not present

## 2016-12-12 DIAGNOSIS — F0281 Dementia in other diseases classified elsewhere with behavioral disturbance: Secondary | ICD-10-CM | POA: Diagnosis not present

## 2016-12-19 DIAGNOSIS — G308 Other Alzheimer's disease: Secondary | ICD-10-CM | POA: Diagnosis not present

## 2016-12-19 DIAGNOSIS — N189 Chronic kidney disease, unspecified: Secondary | ICD-10-CM | POA: Diagnosis not present

## 2016-12-19 DIAGNOSIS — R2689 Other abnormalities of gait and mobility: Secondary | ICD-10-CM | POA: Diagnosis not present

## 2016-12-19 DIAGNOSIS — N390 Urinary tract infection, site not specified: Secondary | ICD-10-CM | POA: Diagnosis not present

## 2016-12-22 DIAGNOSIS — Z79899 Other long term (current) drug therapy: Secondary | ICD-10-CM | POA: Diagnosis not present

## 2017-01-02 DIAGNOSIS — Z87440 Personal history of urinary (tract) infections: Secondary | ICD-10-CM | POA: Diagnosis not present

## 2017-01-02 DIAGNOSIS — R531 Weakness: Secondary | ICD-10-CM | POA: Diagnosis not present

## 2017-01-02 DIAGNOSIS — R2689 Other abnormalities of gait and mobility: Secondary | ICD-10-CM | POA: Diagnosis not present

## 2017-01-02 DIAGNOSIS — R410 Disorientation, unspecified: Secondary | ICD-10-CM | POA: Diagnosis not present

## 2017-01-02 DIAGNOSIS — Z79899 Other long term (current) drug therapy: Secondary | ICD-10-CM | POA: Diagnosis not present

## 2017-01-02 DIAGNOSIS — G308 Other Alzheimer's disease: Secondary | ICD-10-CM | POA: Diagnosis not present

## 2017-01-02 DIAGNOSIS — N189 Chronic kidney disease, unspecified: Secondary | ICD-10-CM | POA: Diagnosis not present

## 2017-01-02 DIAGNOSIS — E875 Hyperkalemia: Secondary | ICD-10-CM | POA: Diagnosis not present

## 2017-01-09 DIAGNOSIS — Z79899 Other long term (current) drug therapy: Secondary | ICD-10-CM | POA: Diagnosis not present

## 2017-01-09 DIAGNOSIS — R451 Restlessness and agitation: Secondary | ICD-10-CM | POA: Diagnosis not present

## 2017-01-09 DIAGNOSIS — E875 Hyperkalemia: Secondary | ICD-10-CM | POA: Diagnosis not present

## 2017-01-09 DIAGNOSIS — F0281 Dementia in other diseases classified elsewhere with behavioral disturbance: Secondary | ICD-10-CM | POA: Diagnosis not present

## 2017-01-09 DIAGNOSIS — N189 Chronic kidney disease, unspecified: Secondary | ICD-10-CM | POA: Diagnosis not present

## 2017-01-09 DIAGNOSIS — D518 Other vitamin B12 deficiency anemias: Secondary | ICD-10-CM | POA: Diagnosis not present

## 2017-01-23 DIAGNOSIS — F418 Other specified anxiety disorders: Secondary | ICD-10-CM | POA: Diagnosis not present

## 2017-01-23 DIAGNOSIS — F3289 Other specified depressive episodes: Secondary | ICD-10-CM | POA: Diagnosis not present

## 2017-01-23 DIAGNOSIS — Z79899 Other long term (current) drug therapy: Secondary | ICD-10-CM | POA: Diagnosis not present

## 2017-01-23 DIAGNOSIS — G308 Other Alzheimer's disease: Secondary | ICD-10-CM | POA: Diagnosis not present

## 2017-02-13 DIAGNOSIS — M542 Cervicalgia: Secondary | ICD-10-CM | POA: Diagnosis not present

## 2017-02-13 DIAGNOSIS — G9009 Other idiopathic peripheral autonomic neuropathy: Secondary | ICD-10-CM | POA: Diagnosis not present

## 2017-02-13 DIAGNOSIS — M6281 Muscle weakness (generalized): Secondary | ICD-10-CM | POA: Diagnosis not present

## 2017-02-13 DIAGNOSIS — I5089 Other heart failure: Secondary | ICD-10-CM | POA: Diagnosis not present

## 2017-02-13 DIAGNOSIS — M5030 Other cervical disc degeneration, unspecified cervical region: Secondary | ICD-10-CM | POA: Diagnosis not present

## 2017-02-13 DIAGNOSIS — N390 Urinary tract infection, site not specified: Secondary | ICD-10-CM | POA: Diagnosis not present

## 2017-02-13 DIAGNOSIS — I10 Essential (primary) hypertension: Secondary | ICD-10-CM | POA: Diagnosis not present

## 2017-03-06 DIAGNOSIS — I10 Essential (primary) hypertension: Secondary | ICD-10-CM | POA: Diagnosis not present

## 2017-03-06 DIAGNOSIS — N189 Chronic kidney disease, unspecified: Secondary | ICD-10-CM | POA: Diagnosis not present

## 2017-03-06 DIAGNOSIS — R062 Wheezing: Secondary | ICD-10-CM | POA: Diagnosis not present

## 2017-03-06 DIAGNOSIS — I5089 Other heart failure: Secondary | ICD-10-CM | POA: Diagnosis not present

## 2017-03-06 DIAGNOSIS — R2689 Other abnormalities of gait and mobility: Secondary | ICD-10-CM | POA: Diagnosis not present

## 2017-03-06 DIAGNOSIS — Z79899 Other long term (current) drug therapy: Secondary | ICD-10-CM | POA: Diagnosis not present

## 2017-03-06 DIAGNOSIS — R54 Age-related physical debility: Secondary | ICD-10-CM | POA: Diagnosis not present

## 2017-03-20 DIAGNOSIS — R2689 Other abnormalities of gait and mobility: Secondary | ICD-10-CM | POA: Diagnosis not present

## 2017-03-20 DIAGNOSIS — I5089 Other heart failure: Secondary | ICD-10-CM | POA: Diagnosis not present

## 2017-03-20 DIAGNOSIS — K529 Noninfective gastroenteritis and colitis, unspecified: Secondary | ICD-10-CM | POA: Diagnosis not present

## 2017-03-20 DIAGNOSIS — Z79899 Other long term (current) drug therapy: Secondary | ICD-10-CM | POA: Diagnosis not present

## 2017-03-20 DIAGNOSIS — R635 Abnormal weight gain: Secondary | ICD-10-CM | POA: Diagnosis not present

## 2017-03-20 DIAGNOSIS — R54 Age-related physical debility: Secondary | ICD-10-CM | POA: Diagnosis not present

## 2017-03-30 DIAGNOSIS — R531 Weakness: Secondary | ICD-10-CM | POA: Diagnosis not present

## 2017-03-30 DIAGNOSIS — N390 Urinary tract infection, site not specified: Secondary | ICD-10-CM | POA: Diagnosis not present

## 2017-03-30 DIAGNOSIS — K529 Noninfective gastroenteritis and colitis, unspecified: Secondary | ICD-10-CM | POA: Diagnosis not present

## 2017-03-30 DIAGNOSIS — R627 Adult failure to thrive: Secondary | ICD-10-CM | POA: Diagnosis not present

## 2017-03-30 DIAGNOSIS — I10 Essential (primary) hypertension: Secondary | ICD-10-CM | POA: Diagnosis not present

## 2017-03-30 DIAGNOSIS — R2689 Other abnormalities of gait and mobility: Secondary | ICD-10-CM | POA: Diagnosis not present

## 2017-03-30 DIAGNOSIS — B3789 Other sites of candidiasis: Secondary | ICD-10-CM | POA: Diagnosis not present

## 2017-03-30 DIAGNOSIS — R54 Age-related physical debility: Secondary | ICD-10-CM | POA: Diagnosis not present

## 2017-04-11 DIAGNOSIS — F0391 Unspecified dementia with behavioral disturbance: Secondary | ICD-10-CM | POA: Diagnosis not present

## 2017-04-11 DIAGNOSIS — F39 Unspecified mood [affective] disorder: Secondary | ICD-10-CM | POA: Diagnosis not present

## 2017-04-11 DIAGNOSIS — I10 Essential (primary) hypertension: Secondary | ICD-10-CM | POA: Diagnosis not present

## 2017-04-11 DIAGNOSIS — I6920 Unspecified sequelae of other nontraumatic intracranial hemorrhage: Secondary | ICD-10-CM | POA: Diagnosis not present

## 2017-04-13 DIAGNOSIS — F419 Anxiety disorder, unspecified: Secondary | ICD-10-CM | POA: Diagnosis not present

## 2017-04-13 DIAGNOSIS — I10 Essential (primary) hypertension: Secondary | ICD-10-CM | POA: Diagnosis not present

## 2017-04-13 DIAGNOSIS — G309 Alzheimer's disease, unspecified: Secondary | ICD-10-CM | POA: Diagnosis not present

## 2017-04-13 DIAGNOSIS — N182 Chronic kidney disease, stage 2 (mild): Secondary | ICD-10-CM | POA: Diagnosis not present

## 2017-04-13 DIAGNOSIS — R4182 Altered mental status, unspecified: Secondary | ICD-10-CM | POA: Diagnosis not present

## 2017-04-25 DIAGNOSIS — Z7409 Other reduced mobility: Secondary | ICD-10-CM | POA: Diagnosis not present

## 2017-04-25 DIAGNOSIS — I10 Essential (primary) hypertension: Secondary | ICD-10-CM | POA: Diagnosis not present

## 2017-04-25 DIAGNOSIS — I872 Venous insufficiency (chronic) (peripheral): Secondary | ICD-10-CM | POA: Diagnosis not present

## 2017-04-25 DIAGNOSIS — R269 Unspecified abnormalities of gait and mobility: Secondary | ICD-10-CM | POA: Diagnosis not present

## 2017-05-16 DIAGNOSIS — N189 Chronic kidney disease, unspecified: Secondary | ICD-10-CM | POA: Diagnosis not present

## 2017-05-16 DIAGNOSIS — R54 Age-related physical debility: Secondary | ICD-10-CM | POA: Diagnosis not present

## 2017-05-16 DIAGNOSIS — G309 Alzheimer's disease, unspecified: Secondary | ICD-10-CM | POA: Diagnosis not present

## 2017-05-16 DIAGNOSIS — F419 Anxiety disorder, unspecified: Secondary | ICD-10-CM | POA: Diagnosis not present

## 2017-05-16 DIAGNOSIS — R4182 Altered mental status, unspecified: Secondary | ICD-10-CM | POA: Diagnosis not present

## 2017-05-18 DIAGNOSIS — R269 Unspecified abnormalities of gait and mobility: Secondary | ICD-10-CM | POA: Diagnosis not present

## 2017-05-25 DIAGNOSIS — I10 Essential (primary) hypertension: Secondary | ICD-10-CM | POA: Diagnosis not present

## 2017-05-25 DIAGNOSIS — F0281 Dementia in other diseases classified elsewhere with behavioral disturbance: Secondary | ICD-10-CM | POA: Diagnosis not present

## 2017-05-25 DIAGNOSIS — I5089 Other heart failure: Secondary | ICD-10-CM | POA: Diagnosis not present

## 2017-05-26 ENCOUNTER — Emergency Department: Payer: PPO

## 2017-05-26 ENCOUNTER — Emergency Department
Admission: EM | Admit: 2017-05-26 | Discharge: 2017-05-26 | Disposition: A | Discharge status: Home / Self Care | Payer: PPO | Attending: Emergency Medicine | Admitting: Emergency Medicine

## 2017-05-26 DIAGNOSIS — I5030 Unspecified diastolic (congestive) heart failure: Secondary | ICD-10-CM | POA: Diagnosis not present

## 2017-05-26 DIAGNOSIS — R079 Chest pain, unspecified: Secondary | ICD-10-CM

## 2017-05-26 DIAGNOSIS — M79602 Pain in left arm: Secondary | ICD-10-CM | POA: Diagnosis not present

## 2017-05-26 DIAGNOSIS — Z7982 Long term (current) use of aspirin: Secondary | ICD-10-CM | POA: Insufficient documentation

## 2017-05-26 DIAGNOSIS — R0789 Other chest pain: Secondary | ICD-10-CM | POA: Diagnosis not present

## 2017-05-26 DIAGNOSIS — Z7902 Long term (current) use of antithrombotics/antiplatelets: Secondary | ICD-10-CM | POA: Insufficient documentation

## 2017-05-26 DIAGNOSIS — Z79899 Other long term (current) drug therapy: Secondary | ICD-10-CM | POA: Insufficient documentation

## 2017-05-26 DIAGNOSIS — M62838 Other muscle spasm: Secondary | ICD-10-CM | POA: Diagnosis not present

## 2017-05-26 DIAGNOSIS — Z8673 Personal history of transient ischemic attack (TIA), and cerebral infarction without residual deficits: Secondary | ICD-10-CM | POA: Insufficient documentation

## 2017-05-26 DIAGNOSIS — F0390 Unspecified dementia without behavioral disturbance: Secondary | ICD-10-CM | POA: Diagnosis not present

## 2017-05-26 DIAGNOSIS — I110 Hypertensive heart disease with heart failure: Secondary | ICD-10-CM | POA: Insufficient documentation

## 2017-05-26 LAB — BASIC METABOLIC PANEL
Anion gap: 11 (ref 5–15)
BUN: 11 mg/dL (ref 6–20)
CHLORIDE: 104 mmol/L (ref 101–111)
CO2: 24 mmol/L (ref 22–32)
CREATININE: 1.18 mg/dL — AB (ref 0.44–1.00)
Calcium: 9.2 mg/dL (ref 8.9–10.3)
GFR calc Af Amer: 47 mL/min — ABNORMAL LOW (ref >60)
GFR calc non Af Amer: 41 mL/min — ABNORMAL LOW (ref >60)
GLUCOSE: 120 mg/dL — AB (ref 65–99)
Potassium: 4 mmol/L (ref 3.5–5.1)
SODIUM: 139 mmol/L (ref 135–145)

## 2017-05-26 LAB — CBC
HCT: 37.7 % (ref 35.0–47.0)
Hemoglobin: 12.9 g/dL (ref 12.0–16.0)
MCH: 32.4 pg (ref 26.0–34.0)
MCHC: 34.2 g/dL (ref 32.0–36.0)
MCV: 94.6 fL (ref 80.0–100.0)
Platelets: 296 10*3/uL (ref 150–440)
RBC: 3.98 MIL/uL (ref 3.80–5.20)
RDW: 13.9 % (ref 11.5–14.5)
WBC: 9 10*3/uL (ref 3.6–11.0)

## 2017-05-26 LAB — TROPONIN I
Troponin I: <0.03 ng/mL (ref <0.03)
Troponin I: <0.03 ng/mL (ref <0.03)

## 2017-05-26 NOTE — Discharge Instructions (Signed)
You are evaluated for left arm pain, and although no certain cause was found, you do have a muscle spasm of the trapezius which could be contributing.  Your exam and evaluation was reassuring as far as the heart is concerned.  Return to emerge department immediately for any worsening condition including chest pain, palpitations, dizziness passing out, arm pain, any numbness or tingling, passing out, or any other symptoms concerning to you.

## 2017-05-26 NOTE — ED Triage Notes (Signed)
Pt presents today via [facility] for chest pain (left shoulder pain). Pt is alert to self only. EDP at bedside.

## 2017-05-26 NOTE — ED Provider Notes (Signed)
[facility] Provider Note ____________________________________________   I have reviewed the triage vital signs and the triage nursing note.  HISTORY  Chief Complaint Chest Pain   Historian Level 5 Caveat History Limited by patient poor historian History per report from EMS taken from nursing home, [facility]  HPI Amanda Hogan is a 84 y.o. female brought in by EMS from her nursing home, report was that she had called out due to chest pain and then when staff evaluated her she was actually having left arm pain that seemed to be worse with movement of the left arm.  There is no reported history of trauma.  Patient herself states that she was not having any chest pain that she was having arm pain and that it is now basically better.  Although she is a bit of a poor historian.  She cannot tell me when the arm pain started.  She cannot tell me anything that made it worse or better.  As best as I can tell symptoms were mild to moderate, and is nearly gone now.   Past Medical History:  Diagnosis Date  . Acid reflux   . Angina   . Anxiety 02/23/2012  . Arthritis   . Chest pain    negative lexiscan myoview 53/13, last echo 02/24/12-EF 32-202, mid systolic obliteration of the LV, cavity aortic sclerosis  . Complication of anesthesia    "w/my back; gave me too much; thought I'd had stroke but didn't"  . Dementia 02/23/2012  . Exertional dyspnea   . Fibromyalgia   . Heart murmur   . High cholesterol   . HTN (hypertension)   . Hypertension   . Kidney stone   . Memory disorder, possibly not taking home meds at times 07/22/2011  . Mild carotid artery disease ([place]) 08/05/11   carotid dopplers, mild disease  . Restless leg syndrome   . Sinus malignant neoplasm ([place])   . Stroke [facility])    "said it looked like bullet holes; had a bunch"  . TIA (transient ischemic attack)     Patient Active Problem List   Diagnosis Date Noted  . Acute  kidney injury ([place]) 12/27/2014  . Acute CVA (cerebrovascular accident) ([place]) 12/16/2014  . Diastolic dysfunction-grade 2 10/29/2014  . Pulmonary hypertension-moderate 10/29/2014  . Troponin level elevated-suspect demand ischemia from AF with RVR 10/28/2014  . Lymphoma history 10/28/2014  . Acute diastolic CHF (congestive heart failure) ([place]) 10/27/2014  . Atrial fibrillation with RVR (new)   . TIA (transient ischemic attack) 02/25/2012  . Encephalopathy acute 02/23/2012  . Anemia 02/23/2012  . Chronic back pain 02/23/2012  . Anxiety 02/23/2012  . Mild carotid artery disease ([place]) 08/05/2011  . Memory disorder, possibly not taking home meds at times 07/22/2011  . Hypertension, at times poorly controlled 07/21/2011  . Chest pain-low risk Myoview May 2013 07/21/2011  . Hyperlipidemia 07/21/2011  . Gastroesophageal reflux disease 07/21/2011  . History of TIA (transient ischemic attack) 07/21/2011  . Obesity 07/21/2011    Past Surgical History:  Procedure Laterality Date  . BREAST CYST EXCISION     left  . CARPAL TUNNEL RELEASE     right  . CATARACT EXTRACTION W/ INTRAOCULAR LENS  IMPLANT, BILATERAL    . CHOLECYSTECTOMY  2005  . ELBOW BURSA SURGERY     left  . EYE SURGERY  1940   fixed my crossed eyes"  . HERNIA REPAIR  2007   "stomach"  . KIDNEY STONE SURGERY     "  right side; cut it out"  . KNEE CARTILAGE SURGERY     right  . LUMBAR SPINE SURGERY     "put box in my lower back"  . LUNG SURGERY     "made 3 holes and pulled gel out"  . VAGINAL HYSTERECTOMY      Prior to Admission medications   Medication Sig Start Date End Date Taking? Authorizing Provider  acetaminophen (TYLENOL) 500 MG tablet Take 1,000 mg by mouth every 8 (eight) hours.    Yes [provider]  aspirin (ASPIRIN CHILDRENS) 81 MG chewable tablet Chew 81 mg by mouth every morning.   Yes [provider]  atorvastatin (LIPITOR) 40 MG tablet Take 1 tablet by mouth daily. 04/25/15  Yes  [provider]  clopidogrel (PLAVIX) 75 MG tablet Take 75 mg by mouth daily.   Yes [provider]  escitalopram (LEXAPRO) 5 MG tablet Take 5 mg by mouth daily.   Yes [provider]  furosemide (LASIX) 20 MG tablet Take 20 mg by mouth daily.   Yes [provider]  LORazepam (ATIVAN) 0.5 MG tablet Take 0.5 mg by mouth 2 (two) times daily as needed for anxiety.   Yes [provider]  memantine (NAMENDA) 5 MG tablet Take 5 mg by mouth at bedtime.    Yes [provider]  metoprolol succinate (TOPROL-XL) 100 MG 24 hr tablet Take 100 mg by mouth daily. Take with or immediately following a meal.   Yes [provider]  ondansetron (ZOFRAN) 4 MG tablet Take 4 mg by mouth every 6 (six) hours as needed for nausea or vomiting.   Yes [provider]  QUEtiapine (SEROQUEL) 25 MG tablet Take 12.5 mg by mouth 2 (two) times daily as needed.   Yes [provider]  ranitidine (ZANTAC) 150 MG tablet Take 150 mg by mouth 2 (two) times daily.    Yes [provider]  rOPINIRole (REQUIP) 1 MG tablet Take 1 mg by mouth 3 (three) times daily.    Yes [provider]  spironolactone (ALDACTONE) 25 MG tablet Take 25 mg by mouth daily.   Yes [provider]  ALPRAZolam (XANAX) 0.25 MG tablet Take 1 tablet (0.25 mg total) by mouth daily as needed for anxiety. Patient not taking: Reported on 05/26/2017 01/01/15   Loletha Grayer, MD  metoprolol succinate (TOPROL-XL) 50 MG 24 hr tablet Take 1 tablet (50 mg total) by mouth daily. Take with or immediately following a meal. Patient not taking: Reported on 05/26/2017 01/01/15   Loletha Grayer, MD  QUEtiapine (SEROQUEL) 25 MG tablet Take 1 tablet (25 mg total) by mouth at bedtime. Patient not taking: Reported on 05/26/2017 12/19/14   Henreitta Leber, MD    Allergies  Allergen Reactions  . Iohexol Shortness Of Breath and Itching  . Codeine Sulfate Other (See Comments)     Unknown reaction.  . Contrast Media [Iodinated Diagnostic Agents] Other (See Comments)    Reaction:  Unknown     Family History  Problem Relation Age of Onset  . Diabetes Neg Hx     Social History Social History   Tobacco Use  . Smoking status: Never Smoker  . Smokeless tobacco: Never Used  Substance Use Topics  . Alcohol use: No  . Drug use: No    Review of Systems  Limited, level 5 caveat due to patient poor historian, however these are her answers  Constitutional: Ports some sinus congestion. Eyes: Negative for visual changes. ENT: Negative  for sore throat. Cardiovascular: Negative for chest pain. Respiratory: Negative for shortness of breath. Gastrointestinal: Negative for abdominal pain. Genitourinary: Negative for dysuria. Musculoskeletal: Negative for back pain. Skin: Negative for rash. Neurological: Negative for headache.  ____________________________________________   PHYSICAL EXAM:  VITAL SIGNS: ED Triage Vitals [05/26/17 0924]  Enc Vitals Group     BP (!) 182/76     Pulse      Resp 19     Temp      Temp src      SpO2 98 %     Weight 200 lb (90.7 kg)     Height 5\' 9"  (1.753 m)     Head Circumference      Peak Flow      Pain Score 5     Pain Loc      Pain Edu?      Excl. in [place]?      Constitutional: Alert and cooperative, poor historian, oriented to self. Well appearing and in no distress. HEENT   Head: Normocephalic and atraumatic.      Eyes: Conjunctivae are normal. Pupils equal and round.       Ears:         Nose: No congestion/rhinnorhea.   Mouth/Throat: Mucous membranes are moist.   Neck: No stridor. Cardiovascular/Chest: Normal rate, regular rhythm.  No murmurs, rubs, or gallops. Respiratory: Normal respiratory effort without tachypnea nor retractions. Breath sounds are clear and equal bilaterally. No wheezes/rales/rhonchi. Gastrointestinal: Soft. No distention, no guarding, no rebound. Nontender.   Obese Genitourinary/rectal:Deferred Musculoskeletal: Nontender to palpation over the chest wall.  Some soreness over the 10th trapezius muscle over the top of her shoulder.  No bony point tenderness or tenderness with range of motion of the arm or upper extremity or any upper extremity edema.  Nontender with normal range of motion in all extremities. No joint effusions.  No lower extremity tenderness.  She does have 2+ lower extremity pitting edema equally lower extremities. Neurologic: No facial droop.  Oriented to self only.  Normal speech and language. No gross or focal neurologic deficits are appreciated. Skin:  Skin is warm, dry and intact. No rash noted. Psychiatric: No agitation.   ____________________________________________  LABS (pertinent positives/negatives) I, Lisa Roca, MD the attending physician have reviewed the labs noted below.  Labs Reviewed  BASIC METABOLIC PANEL - Abnormal; Notable for the following components:      Result Value   Glucose, Bld 120 (*)    Creatinine, Ser 1.18 (*)    GFR calc non Af Amer 41 (*)    GFR calc Af Amer 47 (*)    All other components within normal limits  CBC  TROPONIN I  TROPONIN I    ____________________________________________    EKG I, Lisa Roca, MD, the attending physician have personally viewed and interpreted all ECGs.  82 bpm.  Normal sinus rhythm.  Narrow QRS.  Normal axis.  Occasional PVC.  Normal ST and T wave. ____________________________________________  RADIOLOGY   Chest x-ray two-view reviewed and interpreted by me: No focal infiltrate or edema or bony ab normality. Radiologist interpretation:  IMPRESSION: No acute abnormality seen. __________________________________________  PROCEDURES  Procedure(s) performed: None  Critical Care performed: None   ____________________________________________  ED COURSE / ASSESSMENT AND PLAN  Pertinent labs & imaging results that were available during my care of  the patient were reviewed by me and considered in my medical decision making (see chart for details).   Patient is overall well-appearing, she was sent  in out of concern for complaint of chest pain which sounds like was really followed up by left arm pain which is now essentially resolved.  Vital signs are reassuring.  Her physical exam is reassuring.  Her EKG is reassuring.  ACS seems unlikely, but given she is a poor historian, we will go ahead and check cardiac evaluation including repeat troponin given undetermined onset, etc.  In terms of the complaint of left arm pain, does not only there is any trauma, she is neurovascular intact.  There is no skin sialitis.  There is no swelling for concern for something like DVT.  She does not appear to be suffering with symptoms of radiculopathy.  She does have a tender muscle spasm, question whether or not that could given her some discomfort.  In any case symptoms are essentially resolved now.   Repeat troponin also negative.  I am most suspicious of musculoskeletal left arm pain with muscle spasm of the trapezius.  In any case I am recommending close follow-up with primary care doctor.  DIFFERENTIAL DIAGNOSIS: Differential diagnosis includes, but is not limited to, ACS, aortic dissection, pulmonary embolism, cardiac tamponade, pneumothorax, pneumonia, pericarditis, myocarditis, GI-related causes including esophagitis/gastritis, and musculoskeletal chest wall pain.     CONSULTATIONS:   None   Patient / Family / Caregiver informed of clinical course, medical decision-making process, and agree with plan.   I discussed return precautions, follow-up instructions, and discharge instructions with patient and/or family.  Discharge Instructions : You are evaluated for left arm pain, and although no certain cause was found, you do have a muscle spasm of the trapezius which could be contributing.  Your exam and evaluation was reassuring as far as the  heart is concerned.  Return to emerge department immediately for any worsening condition including chest pain, palpitations, dizziness passing out, arm pain, any numbness or tingling, passing out, or any other symptoms concerning to you.    ___________________________________________   FINAL CLINICAL IMPRESSION(S) / ED DIAGNOSES   Final diagnoses:  Nonspecific chest pain  Left arm pain  Trapezius muscle spasm      ___________________________________________        Note: This dictation was prepared with Dragon dictation. Any transcriptional errors that result from this process are unintentional    Lisa Roca, MD 05/26/17 1411

## 2017-06-01 DIAGNOSIS — F0281 Dementia in other diseases classified elsewhere with behavioral disturbance: Secondary | ICD-10-CM | POA: Diagnosis not present

## 2017-06-01 DIAGNOSIS — I5089 Other heart failure: Secondary | ICD-10-CM | POA: Diagnosis not present

## 2017-06-01 DIAGNOSIS — I10 Essential (primary) hypertension: Secondary | ICD-10-CM | POA: Diagnosis not present

## 2017-06-01 DIAGNOSIS — N189 Chronic kidney disease, unspecified: Secondary | ICD-10-CM | POA: Diagnosis not present

## 2017-06-15 DIAGNOSIS — R269 Unspecified abnormalities of gait and mobility: Secondary | ICD-10-CM | POA: Diagnosis not present

## 2017-08-29 DIAGNOSIS — I672 Cerebral atherosclerosis: Secondary | ICD-10-CM | POA: Diagnosis not present

## 2017-08-29 DIAGNOSIS — R0789 Other chest pain: Secondary | ICD-10-CM | POA: Diagnosis not present

## 2017-08-29 DIAGNOSIS — I10 Essential (primary) hypertension: Secondary | ICD-10-CM | POA: Diagnosis not present

## 2017-08-29 DIAGNOSIS — K219 Gastro-esophageal reflux disease without esophagitis: Secondary | ICD-10-CM | POA: Diagnosis not present

## 2017-08-29 DIAGNOSIS — F0391 Unspecified dementia with behavioral disturbance: Secondary | ICD-10-CM | POA: Diagnosis not present

## 2017-08-31 DIAGNOSIS — I10 Essential (primary) hypertension: Secondary | ICD-10-CM | POA: Diagnosis not present

## 2017-08-31 DIAGNOSIS — Z7409 Other reduced mobility: Secondary | ICD-10-CM | POA: Diagnosis not present

## 2017-08-31 DIAGNOSIS — R609 Edema, unspecified: Secondary | ICD-10-CM | POA: Diagnosis not present

## 2017-08-31 DIAGNOSIS — R54 Age-related physical debility: Secondary | ICD-10-CM | POA: Diagnosis not present

## 2017-08-31 DIAGNOSIS — G308 Other Alzheimer's disease: Secondary | ICD-10-CM | POA: Diagnosis not present

## 2017-08-31 IMAGING — CR DG CHEST 1V PORT
1 series · 1 of 1 positions shown · non-contrast
Comparison: 10/30/2014

CLINICAL DATA: Centralized intermittent chest pain since last
night. Nausea and vomiting. Shortness of breath.

EXAM:
PORTABLE CHEST - 1 VIEW

[ap]
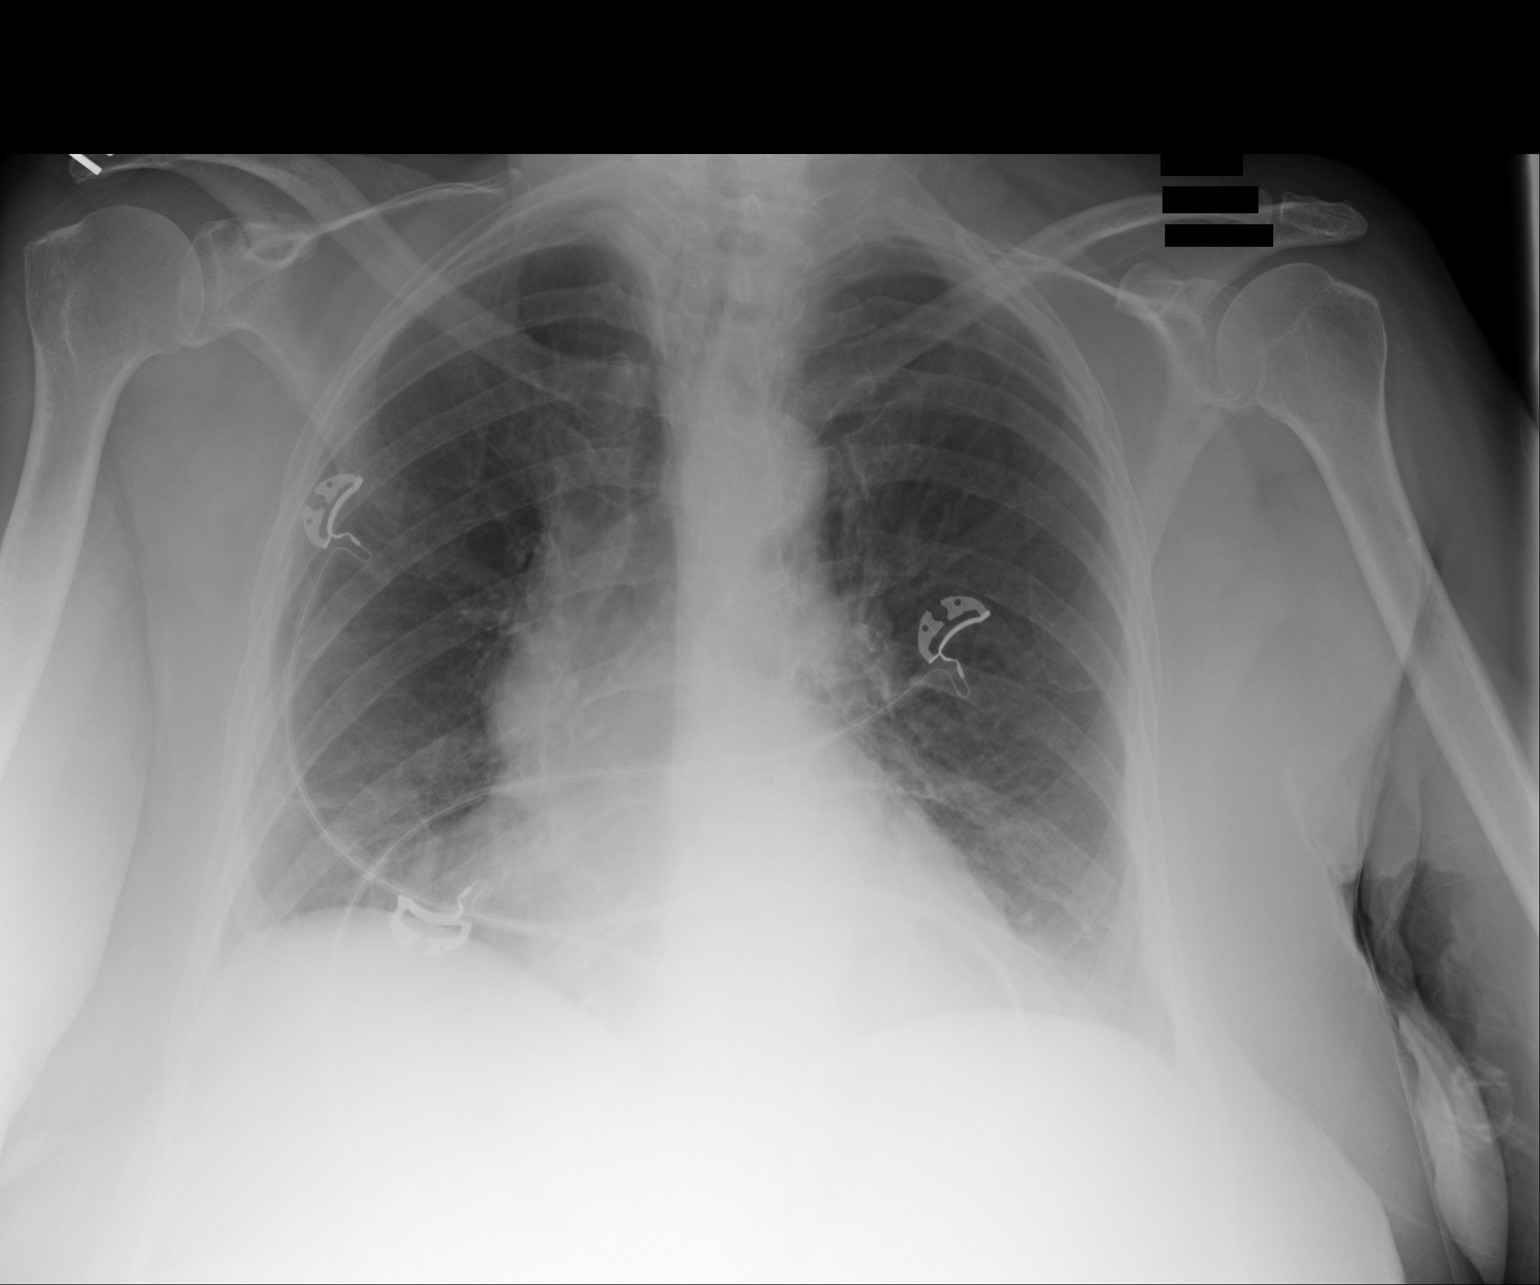

[1 of 1 positions shown; findings below may reference images not displayed]

FINDINGS: Patient rotated minimally right. Midline trachea. Mild cardiomegaly
with transverse aortic atherosclerosis. Right hemidiaphragm
elevation is mild. No pleural effusion or pneumothorax. No
congestive failure. Patchy bibasilar atelectasis.
IMPRESSION: Cardiomegaly without congestive failure.

Low lung volumes with mild bibasilar atelectasis.

## 2017-10-16 ENCOUNTER — Emergency Department: Payer: PPO

## 2017-10-16 ENCOUNTER — Other Ambulatory Visit: Payer: Self-pay

## 2017-10-16 ENCOUNTER — Emergency Department
Admission: EM | Admit: 2017-10-16 | Discharge: 2017-10-16 | Disposition: A | Discharge status: Home / Self Care | Payer: PPO | Attending: Emergency Medicine | Admitting: Emergency Medicine

## 2017-10-16 DIAGNOSIS — R102 Pelvic and perineal pain: Secondary | ICD-10-CM | POA: Insufficient documentation

## 2017-10-16 DIAGNOSIS — Z7901 Long term (current) use of anticoagulants: Secondary | ICD-10-CM | POA: Diagnosis not present

## 2017-10-16 DIAGNOSIS — I110 Hypertensive heart disease with heart failure: Secondary | ICD-10-CM | POA: Insufficient documentation

## 2017-10-16 DIAGNOSIS — Z7982 Long term (current) use of aspirin: Secondary | ICD-10-CM | POA: Diagnosis not present

## 2017-10-16 DIAGNOSIS — Z8673 Personal history of transient ischemic attack (TIA), and cerebral infarction without residual deficits: Secondary | ICD-10-CM | POA: Insufficient documentation

## 2017-10-16 DIAGNOSIS — M549 Dorsalgia, unspecified: Secondary | ICD-10-CM | POA: Diagnosis not present

## 2017-10-16 DIAGNOSIS — M545 Low back pain: Secondary | ICD-10-CM | POA: Diagnosis not present

## 2017-10-16 DIAGNOSIS — K439 Ventral hernia without obstruction or gangrene: Secondary | ICD-10-CM | POA: Diagnosis not present

## 2017-10-16 DIAGNOSIS — G8929 Other chronic pain: Secondary | ICD-10-CM | POA: Diagnosis not present

## 2017-10-16 DIAGNOSIS — Z79899 Other long term (current) drug therapy: Secondary | ICD-10-CM | POA: Diagnosis not present

## 2017-10-16 DIAGNOSIS — I5031 Acute diastolic (congestive) heart failure: Secondary | ICD-10-CM | POA: Diagnosis not present

## 2017-10-16 DIAGNOSIS — R109 Unspecified abdominal pain: Secondary | ICD-10-CM | POA: Diagnosis not present

## 2017-10-16 LAB — COMPREHENSIVE METABOLIC PANEL
ALK PHOS: 70 U/L (ref 38–126)
ALT: 14 U/L (ref 0–44)
AST: 18 U/L (ref 15–41)
Albumin: 4.5 g/dL (ref 3.5–5.0)
Anion gap: 9 (ref 5–15)
BILIRUBIN TOTAL: 0.9 mg/dL (ref 0.3–1.2)
BUN: 22 mg/dL (ref 8–23)
CALCIUM: 9.3 mg/dL (ref 8.9–10.3)
CO2: 26 mmol/L (ref 22–32)
Chloride: 106 mmol/L (ref 98–111)
Creatinine, Ser: 1.16 mg/dL — ABNORMAL HIGH (ref 0.44–1.00)
GFR calc non Af Amer: 41 mL/min — ABNORMAL LOW (ref >60)
GFR, EST AFRICAN AMERICAN: 48 mL/min — AB (ref >60)
Glucose, Bld: 111 mg/dL — ABNORMAL HIGH (ref 70–99)
Potassium: 4.6 mmol/L (ref 3.5–5.1)
Sodium: 141 mmol/L (ref 135–145)
TOTAL PROTEIN: 8.1 g/dL (ref 6.5–8.1)

## 2017-10-16 LAB — CBC
HCT: 35.8 % (ref 35.0–47.0)
Hemoglobin: 12.1 g/dL (ref 12.0–16.0)
MCH: 31.9 pg (ref 26.0–34.0)
MCHC: 33.7 g/dL (ref 32.0–36.0)
MCV: 94.6 fL (ref 80.0–100.0)
PLATELETS: 283 10*3/uL (ref 150–440)
RBC: 3.79 MIL/uL — AB (ref 3.80–5.20)
RDW: 13.8 % (ref 11.5–14.5)
WBC: 10.7 10*3/uL (ref 3.6–11.0)

## 2017-10-16 LAB — URINALYSIS, COMPLETE (UACMP) WITH MICROSCOPIC
BILIRUBIN URINE: NEGATIVE
GLUCOSE, UA: NEGATIVE mg/dL
Hgb urine dipstick: NEGATIVE
Ketones, ur: NEGATIVE mg/dL
LEUKOCYTES UA: NEGATIVE
NITRITE: NEGATIVE
PH: 5 (ref 5.0–8.0)
Protein, ur: NEGATIVE mg/dL
SPECIFIC GRAVITY, URINE: 1.025 (ref 1.005–1.030)

## 2017-10-16 MED ORDER — ONDANSETRON HCL 4 MG/2ML IJ SOLN
4.0000 mg | Freq: Once | INTRAMUSCULAR | Status: AC
  Administered 2017-10-16: 4 mg via INTRAVENOUS
  Filled 2017-10-16: qty 2

## 2017-10-16 MED ORDER — TRAMADOL HCL 50 MG PO TABS: 50.0000 mg | ORAL_TABLET | Freq: Four times a day (QID) | ORAL | 0 refills | Status: AC | PRN

## 2017-10-16 MED ORDER — MORPHINE SULFATE (PF) 2 MG/ML IV SOLN
2.0000 mg | Freq: Once | INTRAVENOUS | Status: AC
  Administered 2017-10-16: 2 mg via INTRAVENOUS
  Filled 2017-10-16: qty 1

## 2017-10-16 NOTE — ED Triage Notes (Signed)
Pt complains of low back pain, bilateral lower quadrant pain since yesterday. Ems states strong urine odor noted in pt's room. Pt from [facility]. Pt states she did have diarrhea yesterday but it is gone now.

## 2017-10-16 NOTE — ED Notes (Signed)
Pt given snack and drink 

## 2017-10-16 NOTE — ED Provider Notes (Signed)
[facility] Emergency Department Provider Note    First MD Initiated Contact with Patient 10/16/17 0234     (approximate)  I have reviewed the triage vital signs and the nursing notes.   HISTORY  Chief Complaint Abdominal Pain and Back Pain    HPI Amanda Hogan is a 84 y.o. female with below list of chronic medical conditions including chronic back pain presents to the emergency department from [place] via EMS with low back pain since yesterday.  Patient denies any fever no lower extremity weakness or numbness.  Patient does admit to diarrhea yesterday but states that it was one episode and now resolved.  Patient denies any nausea or vomiting.  Patient does admit to abdominal discomfort earlier but stating that her pain is not in her abdomen but in her back.  Patient denies any urinary symptoms.  EMS states that there was a strong odor of urine the patient's room on their arrival.   Past Medical History:  Diagnosis Date  . Acid reflux   . Angina   . Anxiety 02/23/2012  . Arthritis   . Chest pain    negative lexiscan myoview 53/13, last echo 02/24/12-EF 64-403, mid systolic obliteration of the LV, cavity aortic sclerosis  . Complication of anesthesia    "w/my back; gave me too much; thought I'd had stroke but didn't"  . Dementia 02/23/2012  . Exertional dyspnea   . Fibromyalgia   . Heart murmur   . High cholesterol   . HTN (hypertension)   . Hypertension   . Kidney stone   . Memory disorder, possibly not taking home meds at times 07/22/2011  . Mild carotid artery disease ([place]) 08/05/11   carotid dopplers, mild disease  . Restless leg syndrome   . Sinus malignant neoplasm ([place])   . Stroke [facility])    "said it looked like bullet holes; had a bunch"  . TIA (transient ischemic attack)     Patient Active Problem List   Diagnosis Date Noted  . Acute kidney injury ([place]) 12/27/2014  . Acute CVA (cerebrovascular accident) ([place]) 12/16/2014  .  Diastolic dysfunction-grade 2 10/29/2014  . Pulmonary hypertension-moderate 10/29/2014  . Troponin level elevated-suspect demand ischemia from AF with RVR 10/28/2014  . Lymphoma history 10/28/2014  . Acute diastolic CHF (congestive heart failure) ([place]) 10/27/2014  . Atrial fibrillation with RVR (new)   . TIA (transient ischemic attack) 02/25/2012  . Encephalopathy acute 02/23/2012  . Anemia 02/23/2012  . Chronic back pain 02/23/2012  . Anxiety 02/23/2012  . Mild carotid artery disease ([place]) 08/05/2011  . Memory disorder, possibly not taking home meds at times 07/22/2011  . Hypertension, at times poorly controlled 07/21/2011  . Chest pain-low risk Myoview May 2013 07/21/2011  . Hyperlipidemia 07/21/2011  . Gastroesophageal reflux disease 07/21/2011  . History of TIA (transient ischemic attack) 07/21/2011  . Obesity 07/21/2011    Past Surgical History:  Procedure Laterality Date  . BREAST CYST EXCISION     left  . CARPAL TUNNEL RELEASE     right  . CATARACT EXTRACTION W/ INTRAOCULAR LENS  IMPLANT, BILATERAL    . CHOLECYSTECTOMY  2005  . ELBOW BURSA SURGERY     left  . EYE SURGERY  1940   fixed my crossed eyes"  . HERNIA REPAIR  2007   "stomach"  . KIDNEY STONE SURGERY     "right side; cut it out"  . KNEE CARTILAGE SURGERY     right  . LUMBAR  SPINE SURGERY     "put box in my lower back"  . LUNG SURGERY     "made 3 holes and pulled gel out"  . VAGINAL HYSTERECTOMY      Prior to Admission medications   Medication Sig Start Date End Date Taking? Authorizing Provider  acetaminophen (TYLENOL) 500 MG tablet Take 1,000 mg by mouth every 8 (eight) hours.     [provider]  ALPRAZolam Duanne Moron) 0.25 MG tablet Take 1 tablet (0.25 mg total) by mouth daily as needed for anxiety. Patient not taking: Reported on 05/26/2017 01/01/15   Loletha Grayer, MD  aspirin (ASPIRIN CHILDRENS) 81 MG chewable tablet Chew 81 mg by mouth every morning.    [provider]    atorvastatin (LIPITOR) 40 MG tablet Take 1 tablet by mouth daily. 04/25/15   [provider]  clopidogrel (PLAVIX) 75 MG tablet Take 75 mg by mouth daily.    [provider]  escitalopram (LEXAPRO) 5 MG tablet Take 5 mg by mouth daily.    [provider]  furosemide (LASIX) 20 MG tablet Take 20 mg by mouth daily.    [provider]  LORazepam (ATIVAN) 0.5 MG tablet Take 0.5 mg by mouth 2 (two) times daily as needed for anxiety.    [provider]  memantine (NAMENDA) 5 MG tablet Take 5 mg by mouth at bedtime.     [provider]  metoprolol succinate (TOPROL-XL) 100 MG 24 hr tablet Take 100 mg by mouth daily. Take with or immediately following a meal.    [provider]  metoprolol succinate (TOPROL-XL) 50 MG 24 hr tablet Take 1 tablet (50 mg total) by mouth daily. Take with or immediately following a meal. Patient not taking: Reported on 05/26/2017 01/01/15   Loletha Grayer, MD  ondansetron (ZOFRAN) 4 MG tablet Take 4 mg by mouth every 6 (six) hours as needed for nausea or vomiting.    [provider]  QUEtiapine (SEROQUEL) 25 MG tablet Take 1 tablet (25 mg total) by mouth at bedtime. Patient not taking: Reported on 05/26/2017 12/19/14   Henreitta Leber, MD  QUEtiapine (SEROQUEL) 25 MG tablet Take 12.5 mg by mouth 2 (two) times daily as needed.    [provider]  ranitidine (ZANTAC) 150 MG tablet Take 150 mg by mouth 2 (two) times daily.     [provider]  rOPINIRole (REQUIP) 1 MG tablet Take 1 mg by mouth 3 (three) times daily.     [provider]  spironolactone (ALDACTONE) 25 MG tablet Take 25 mg by mouth daily.    [provider]    Allergies Iohexol; Codeine sulfate; and Contrast media [iodinated diagnostic agents]  Family History  Problem Relation Age of Onset  . Diabetes Neg Hx     Social History Social History   Tobacco Use  . Smoking status: Never Smoker  . Smokeless  tobacco: Never Used  Substance Use Topics  . Alcohol use: No  . Drug use: No    Review of Systems Constitutional: No fever/chills Eyes: No visual changes. ENT: No sore throat. Cardiovascular: Denies chest pain. Respiratory: Denies shortness of breath. Gastrointestinal: No abdominal pain.  No nausea, no vomiting.  No diarrhea.  No constipation. Genitourinary: Negative for dysuria. Musculoskeletal: Negative for neck pain.  Negative for back pain. Integumentary: Negative for rash. Neurological: Negative for headaches, focal weakness or numbness. __________________   PHYSICAL EXAM:  VITAL SIGNS: ED Triage Vitals  Enc Vitals Group  BP 10/16/17 0228 (!) 184/70     Pulse Rate 10/16/17 0228 80     Resp 10/16/17 0228 20     Temp 10/16/17 0228 98.1 F (36.7 C)     Temp Source 10/16/17 0228 Oral     SpO2 10/16/17 0228 98 %     Weight 10/16/17 0229 90.7 kg (200 lb)     Height 10/16/17 0229 1.626 m (5\' 4" )     Head Circumference --      Peak Flow --      Pain Score 10/16/17 0229 6     Pain Loc --      Pain Edu? --      Excl. in [place]? --     Constitutional: Alert .  Apparent discomfort  eyes: Conjunctivae are normal.  Head: Atraumatic. Mouth/Throat: Mucous membranes are moist.  Oropharynx non-erythematous. Neck: No stridor.   Cardiovascular: Normal rate, regular rhythm. Good peripheral circulation. Grossly normal heart sounds. Respiratory: Normal respiratory effort.  No retractions. Lungs CTAB. Gastrointestinal: Soft and nontender. No distention.  Musculoskeletal: No lower extremity tenderness nor edema. No gross deformities of extremities. Neurologic:  Normal speech and language. No gross focal neurologic deficits are appreciated.  Skin:  Skin is warm, dry and intact. No rash noted.  ____________________________________________   LABS (all labs ordered are listed, but only abnormal results are displayed)  Labs Reviewed  COMPREHENSIVE METABOLIC PANEL - Abnormal; Notable  for the following components:      Result Value   Glucose, Bld 111 (*)    Creatinine, Ser 1.16 (*)    GFR calc non Af Amer 41 (*)    GFR calc Af Amer 48 (*)    All other components within normal limits  CBC - Abnormal; Notable for the following components:   RBC 3.79 (*)    All other components within normal limits  URINALYSIS, COMPLETE (UACMP) WITH MICROSCOPIC - Abnormal; Notable for the following components:   Color, Urine YELLOW (*)    APPearance HAZY (*)    Bacteria, UA RARE (*)    All other components within normal limits   ________________________________________  RADIOLOGY I, [place] N Shayleigh Bouldin, personally viewed and evaluated these images (plain radiographs) as part of my medical decision making, as well as reviewing the written report by the radiologist.  ED MD interpretation: No acute abdominal or pelvic pathology noted on CT scan per radiologist  Official radiology report(s): Ct Abdomen Pelvis Wo Contrast  Result Date: 10/16/2017 CLINICAL DATA:  Low back, pelvic pain since yesterday. Diarrhea yesterday. History of hernia repair, cholecystectomy, hysterectomy, lumbar spine surgery. EXAM: CT ABDOMEN AND PELVIS WITHOUT CONTRAST TECHNIQUE: Multidetector CT imaging of the abdomen and pelvis was performed following the standard protocol without IV contrast. COMPARISON:  Lumbar spine radiographs June 19, 2015. FINDINGS: LOWER CHEST: Bibasilar scarring. Included heart size upper limits of normal. No pericardial effusions. HEPATOBILIARY: Status post cholecystectomy. Negative non-contrast CT liver. PANCREAS: Normal. SPLEEN: Normal. ADRENALS/URINARY TRACT: Kidneys are orthotopic, demonstrating normal size and morphology. No nephrolithiasis, hydronephrosis; limited assessment for renal masses by nonenhanced CT. The unopacified ureters are normal in course and caliber. Urinary bladder is decompressed and unremarkable. Normal adrenal glands. STOMACH/BOWEL: Small hiatal hernia. The stomach,  small and large bowel are normal in course and caliber without inflammatory changes, sensitivity decreased by lack of enteric contrast. Small duodenal diverticulum with air-fluid level. Moderate descending and sigmoid colonic diverticulosis. A few additional scattered colonic diverticula present. Normal appendix. VASCULAR/LYMPHATIC: Aortoiliac vessels are normal in course and caliber.  Moderate calcific atherosclerosis. No lymphadenopathy by CT size criteria. REPRODUCTIVE: Status post hysterectomy. OTHER: No intraperitoneal free fluid or free air. MUSCULOSKELETAL: Non-acute. Narrow necked moderate fat containing umbilical hernia at the inferior umbilical herniorrhaphy with mesh. Anterior abdominal wall scarring. Status post L4-5 PLIF with arthrodesis. Severe L1-2 degenerative disc. No fracture or malalignment. Mild dextroscoliosis may be positional. IMPRESSION: 1. No nephrolithiasis, hydronephrosis or acute intra-abdominal/pelvic process. 2. Colonic diverticulosis. 3. Moderate fat containing ventral hernia, status post ventral herniorrhaphy. Aortic Atherosclerosis (ICD10-I70.0). Electronically Signed   By: Elon Alas M.D.   On: 10/16/2017 03:15    ____________________________________________ \ Procedures   ____________________________________________   INITIAL IMPRESSION / ASSESSMENT AND PLAN / ED COURSE  As part of my medical decision making, I reviewed the following data within the electronic MEDICAL RECORD NUMBER    84 year old female presenting with above-stated history and physical exam of back pain with reported abdominal pain none to this physician.  Considered possibility of pyelonephritis AAA diverticulitis versus other potential intra-abdominal pathology.  Also considered possibility that this could be secondary to patient's known chronic back pain.  Laboratory data including urinalysis unremarkable CT scan of the abdomen pelvis was performed which revealed no acute intra-abdominal or  pelvic pathology.  Patient was given IV morphine and on my reevaluation patient had no pain raising possibility of this being secondary to the patient's known chronic back pain.  Patient will be discharged back to the facility with recommendation for outpatient follow-up ____________________________________________  FINAL CLINICAL IMPRESSION(S) / ED DIAGNOSES  Final diagnoses:  Chronic bilateral low back pain without sciatica     MEDICATIONS GIVEN DURING THIS VISIT:  Medications  morphine 2 MG/ML injection 2 mg (2 mg Intravenous Given 10/16/17 0432)  ondansetron (ZOFRAN) injection 4 mg (4 mg Intravenous Given 10/16/17 0432)     ED Discharge Orders    None       Note:  This document was prepared using Dragon voice recognition software and may include unintentional dictation errors.    Gregor Hams, MD 10/16/17 2146

## 2017-10-16 NOTE — ED Notes (Signed)
Called  [facility] for transport to [place]

## 2017-10-18 DIAGNOSIS — F0390 Unspecified dementia without behavioral disturbance: Secondary | ICD-10-CM | POA: Diagnosis not present

## 2017-10-18 DIAGNOSIS — E782 Mixed hyperlipidemia: Secondary | ICD-10-CM | POA: Diagnosis not present

## 2017-10-18 DIAGNOSIS — M545 Low back pain: Secondary | ICD-10-CM | POA: Diagnosis not present

## 2017-10-18 DIAGNOSIS — I10 Essential (primary) hypertension: Secondary | ICD-10-CM | POA: Diagnosis not present

## 2017-11-01 DIAGNOSIS — Z79899 Other long term (current) drug therapy: Secondary | ICD-10-CM | POA: Diagnosis not present

## 2017-11-01 DIAGNOSIS — F0390 Unspecified dementia without behavioral disturbance: Secondary | ICD-10-CM | POA: Diagnosis not present

## 2017-11-01 DIAGNOSIS — E782 Mixed hyperlipidemia: Secondary | ICD-10-CM | POA: Diagnosis not present

## 2017-11-01 DIAGNOSIS — M545 Low back pain: Secondary | ICD-10-CM | POA: Diagnosis not present

## 2017-11-01 DIAGNOSIS — I10 Essential (primary) hypertension: Secondary | ICD-10-CM | POA: Diagnosis not present

## 2017-12-05 ENCOUNTER — Encounter: Payer: Self-pay | Admitting: Emergency Medicine

## 2017-12-05 ENCOUNTER — Emergency Department
Admission: EM | Admit: 2017-12-05 | Discharge: 2017-12-05 | Disposition: A | Discharge status: Home / Self Care | Payer: PPO | Attending: Emergency Medicine | Admitting: Emergency Medicine

## 2017-12-05 ENCOUNTER — Other Ambulatory Visit: Payer: Self-pay

## 2017-12-05 ENCOUNTER — Emergency Department: Payer: PPO

## 2017-12-05 DIAGNOSIS — Z7901 Long term (current) use of anticoagulants: Secondary | ICD-10-CM | POA: Insufficient documentation

## 2017-12-05 DIAGNOSIS — F0390 Unspecified dementia without behavioral disturbance: Secondary | ICD-10-CM | POA: Insufficient documentation

## 2017-12-05 DIAGNOSIS — S0093XA Contusion of unspecified part of head, initial encounter: Secondary | ICD-10-CM

## 2017-12-05 DIAGNOSIS — Y9289 Other specified places as the place of occurrence of the external cause: Secondary | ICD-10-CM | POA: Insufficient documentation

## 2017-12-05 DIAGNOSIS — I110 Hypertensive heart disease with heart failure: Secondary | ICD-10-CM | POA: Diagnosis not present

## 2017-12-05 DIAGNOSIS — I5030 Unspecified diastolic (congestive) heart failure: Secondary | ICD-10-CM | POA: Insufficient documentation

## 2017-12-05 DIAGNOSIS — Y939 Activity, unspecified: Secondary | ICD-10-CM | POA: Insufficient documentation

## 2017-12-05 DIAGNOSIS — Z79899 Other long term (current) drug therapy: Secondary | ICD-10-CM | POA: Diagnosis not present

## 2017-12-05 DIAGNOSIS — Y999 Unspecified external cause status: Secondary | ICD-10-CM | POA: Insufficient documentation

## 2017-12-05 DIAGNOSIS — S0003XA Contusion of scalp, initial encounter: Secondary | ICD-10-CM | POA: Diagnosis not present

## 2017-12-05 DIAGNOSIS — W51XXXA Accidental striking against or bumped into by another person, initial encounter: Secondary | ICD-10-CM | POA: Diagnosis not present

## 2017-12-05 DIAGNOSIS — S0990XA Unspecified injury of head, initial encounter: Secondary | ICD-10-CM | POA: Diagnosis not present
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
## 2017-12-05 NOTE — ED Notes (Signed)
Pt requested RN to contact children. Message left for daughter. Son, Audry Pili, made aware of mother's presence in the ED

## 2017-12-05 NOTE — ED Notes (Signed)
Pt return from CT.
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
## 2017-12-06 DIAGNOSIS — M545 Low back pain: Secondary | ICD-10-CM | POA: Diagnosis not present

## 2017-12-06 DIAGNOSIS — Z79899 Other long term (current) drug therapy: Secondary | ICD-10-CM | POA: Diagnosis not present

## 2017-12-06 DIAGNOSIS — I10 Essential (primary) hypertension: Secondary | ICD-10-CM | POA: Diagnosis not present

## 2017-12-06 DIAGNOSIS — S0000XA Unspecified superficial injury of scalp, initial encounter: Secondary | ICD-10-CM | POA: Diagnosis not present

## 2017-12-06 DIAGNOSIS — F0390 Unspecified dementia without behavioral disturbance: Secondary | ICD-10-CM | POA: Diagnosis not present

## 2018-01-24 DIAGNOSIS — M545 Low back pain: Secondary | ICD-10-CM | POA: Diagnosis not present

## 2018-01-24 DIAGNOSIS — F0390 Unspecified dementia without behavioral disturbance: Secondary | ICD-10-CM | POA: Diagnosis not present

## 2018-01-24 DIAGNOSIS — R109 Unspecified abdominal pain: Secondary | ICD-10-CM | POA: Diagnosis not present

## 2018-01-24 DIAGNOSIS — K5909 Other constipation: Secondary | ICD-10-CM | POA: Diagnosis not present

## 2018-01-25 DIAGNOSIS — R1084 Generalized abdominal pain: Secondary | ICD-10-CM | POA: Diagnosis not present

## 2018-01-31 DIAGNOSIS — M545 Low back pain: Secondary | ICD-10-CM | POA: Diagnosis not present

## 2018-01-31 DIAGNOSIS — I10 Essential (primary) hypertension: Secondary | ICD-10-CM | POA: Diagnosis not present

## 2018-01-31 DIAGNOSIS — F0390 Unspecified dementia without behavioral disturbance: Secondary | ICD-10-CM | POA: Diagnosis not present

## 2018-01-31 DIAGNOSIS — K5909 Other constipation: Secondary | ICD-10-CM | POA: Diagnosis not present

## 2018-02-08 DIAGNOSIS — Z79899 Other long term (current) drug therapy: Secondary | ICD-10-CM | POA: Diagnosis not present

## 2018-03-01 IMAGING — CT CT HEAD W/O CM
1 series · 15 of 30 positions shown, 19 images · non-contrast
Comparison: December 27, 2014

CLINICAL DATA: Headache and transient aphasia. Altered mental
status

EXAM:
CT HEAD WITHOUT CONTRAST
TECHNIQUE: Contiguous axial images were obtained from the base of the skull
through the vertex without intravenous contrast.

[Series 2: head wo · axial · 0.40mm/px · z∈[+228,+354]mm · 15 of 32 slices shown, 19 images]
[im 2/32  brain]
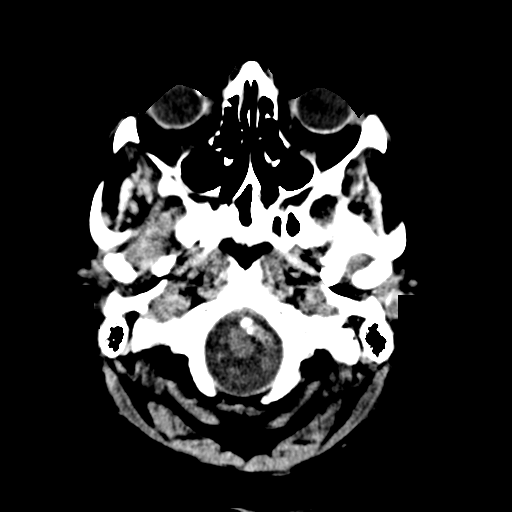
[im 2/32  bone]
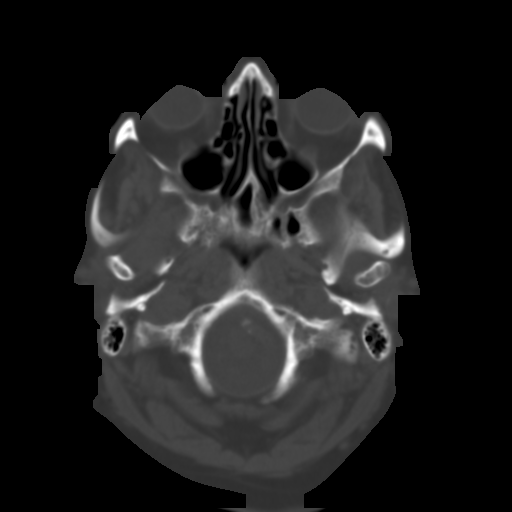
[im 4/32  brain]
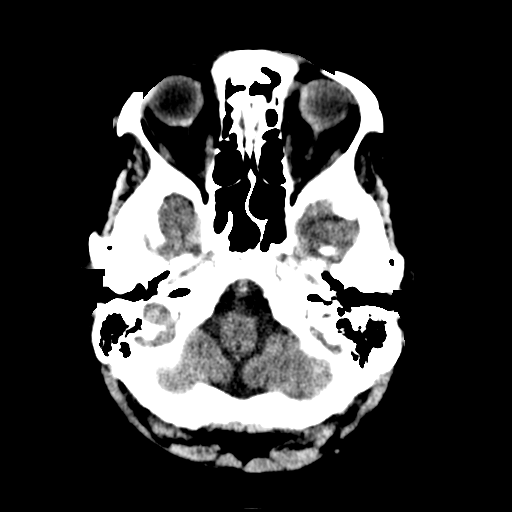
[im 6/32  brain]
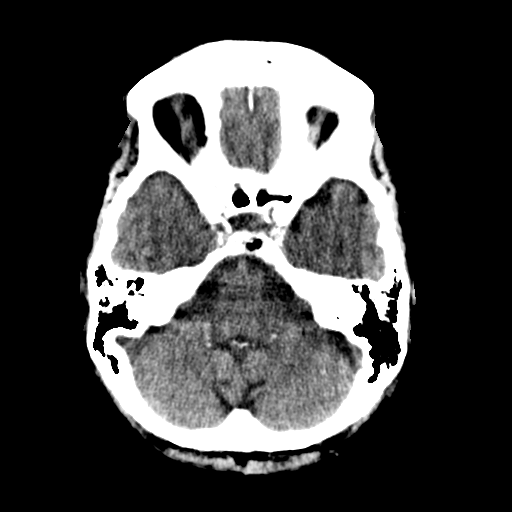
[im 8/32  brain]
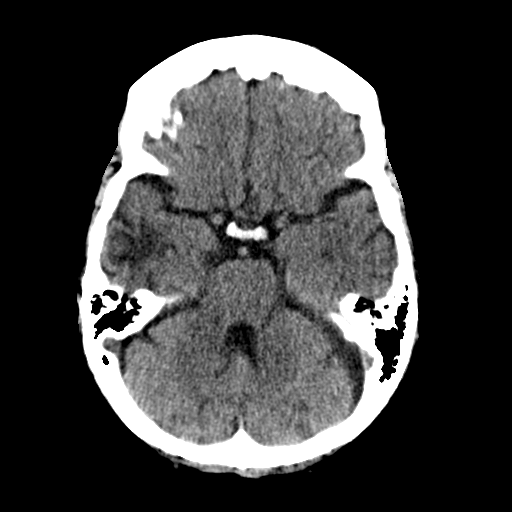
[im 10/32  brain]
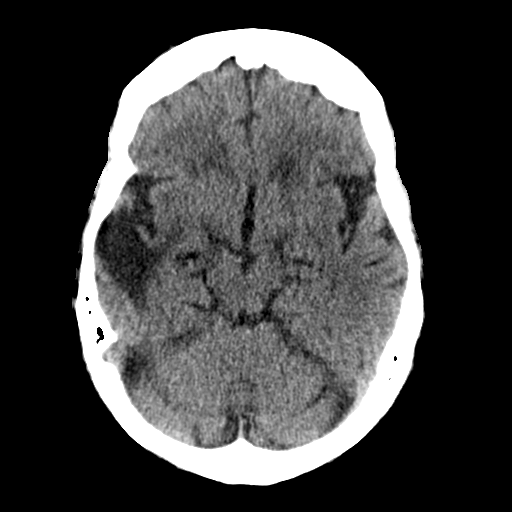
[im 10/32  bone]
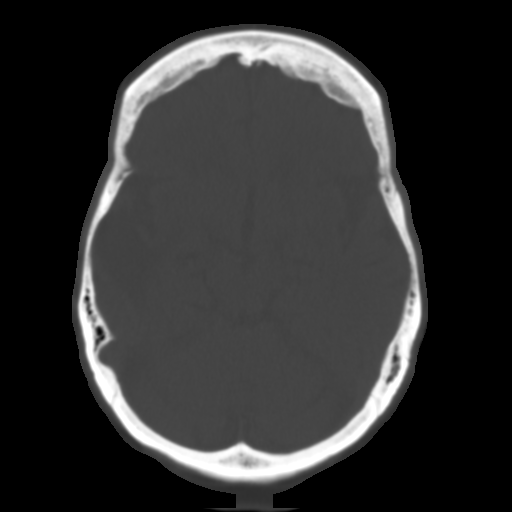
[im 12/32  brain]
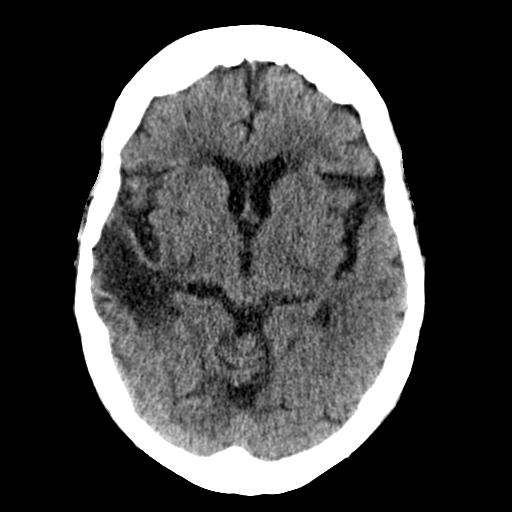
[im 14/32  brain]
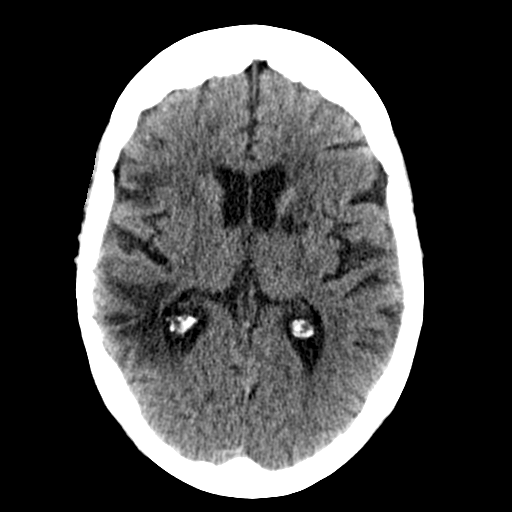
[im 17/32  brain]
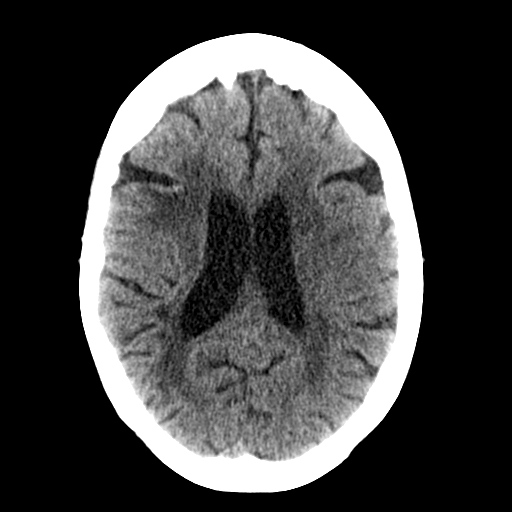
[im 18/32  brain]
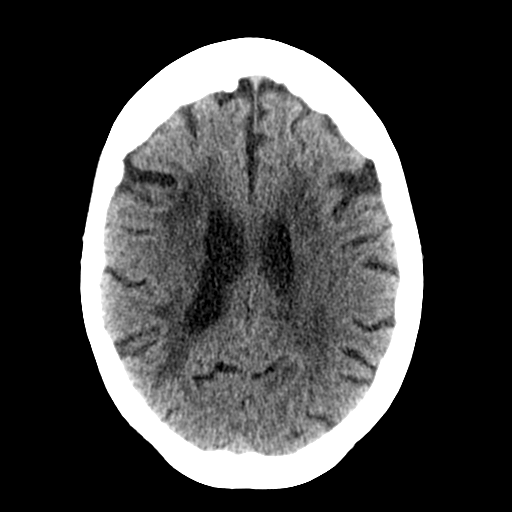
[im 18/32  bone]
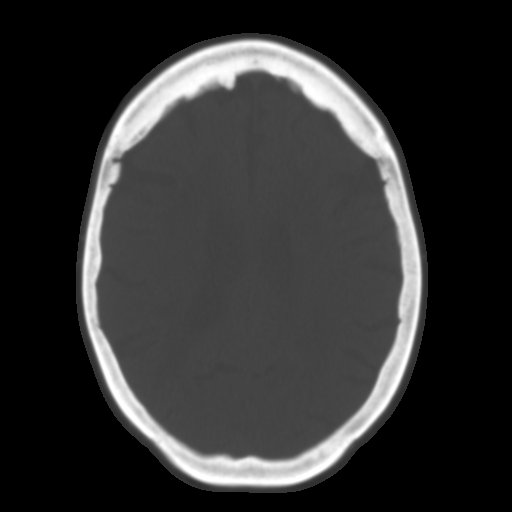
[im 20/32  brain]
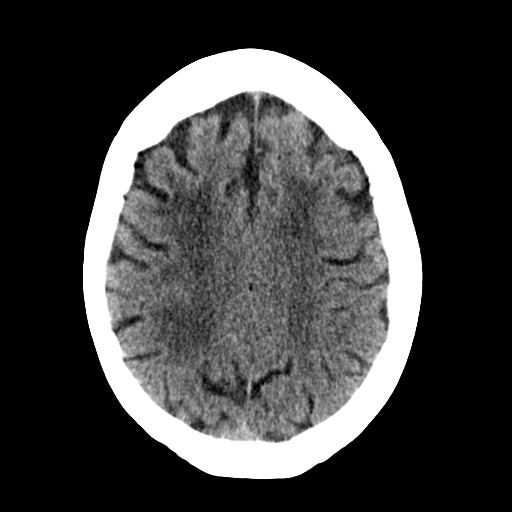
[im 22/32  brain]
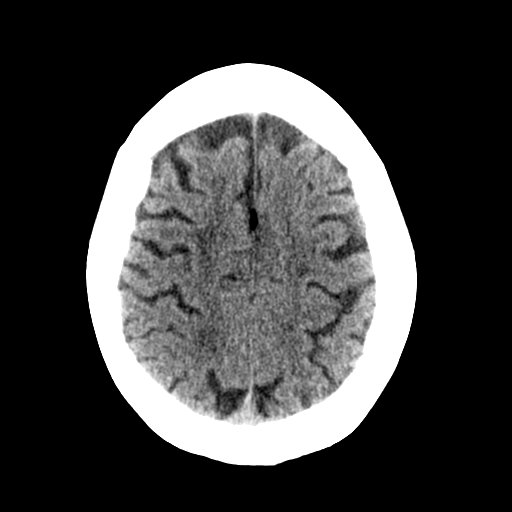
[im 24/32  brain]
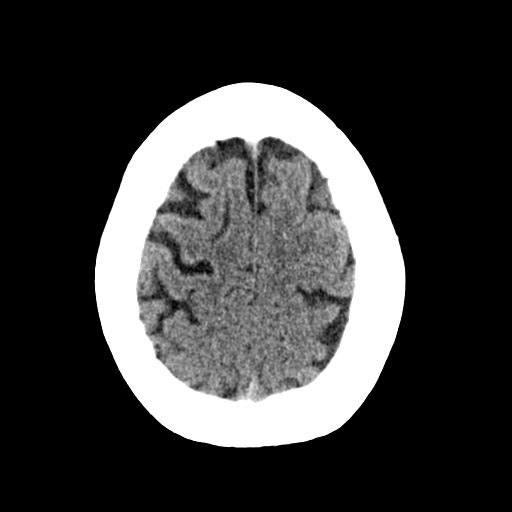
[im 26/32  brain]
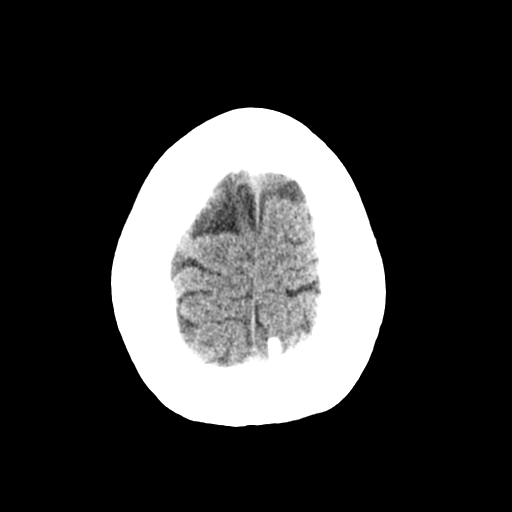
[im 26/32  bone]
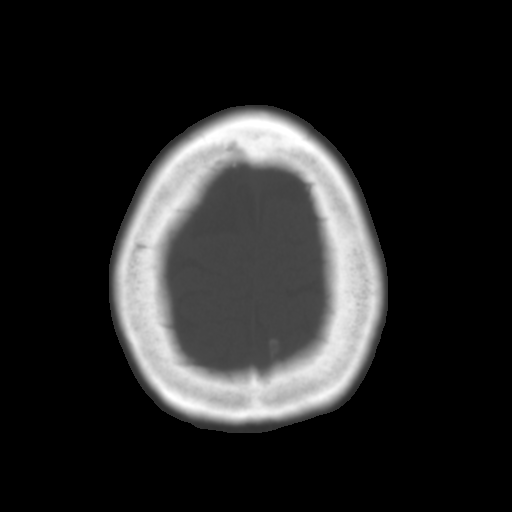
[im 28/32  brain]
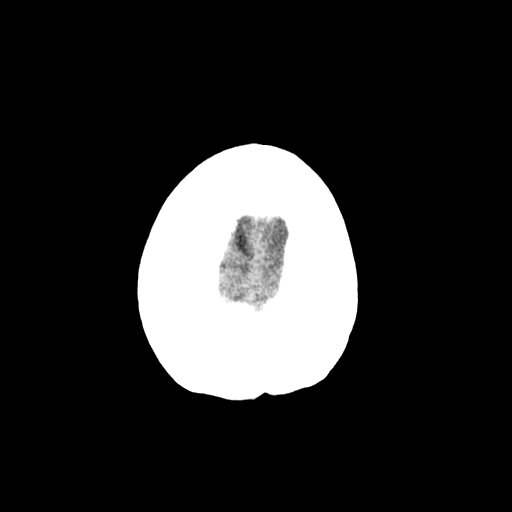
[im 30/32  brain]
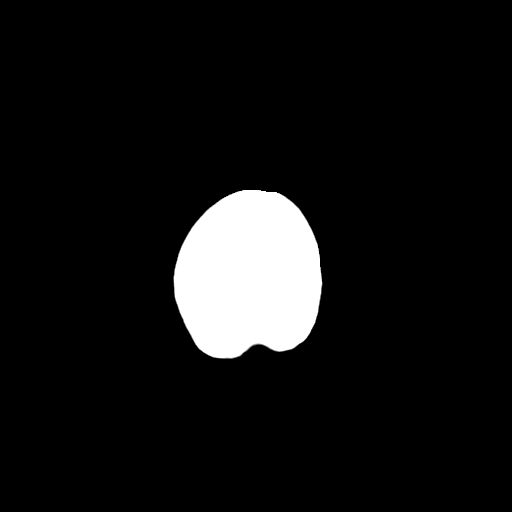

[15 of 30 positions shown; findings below may reference images not displayed]

FINDINGS: Mild diffuse atrophy is stable. There is no intracranial mass,
hemorrhage, extra-axial fluid collection, or midline shift. There is
evidence of a prior infarct involving much of the lateral and
superior right temporal lobe. There is focal infarct at the right
frontal -temporal junction. There is prior infarct in the medial
left basal ganglia which involves a portion of the anterior limb and
genu of the left internal capsule. There is patchy small vessel
disease throughout the centra semiovale bilaterally with several
small lacunar type infarcts in this area. No new gray-white
compartment lesions are identified. No acute infarct evident. The
bony calvarium appears intact. The mastoid air cells are clear. No
intraorbital lesions are apparent.
IMPRESSION: Atrophy with extensive periventricular small vessel disease as well
as multiple prior infarcts as summarized above. No acute infarct
evident. No hemorrhage or mass effect.

## 2018-03-07 DIAGNOSIS — Z79899 Other long term (current) drug therapy: Secondary | ICD-10-CM | POA: Diagnosis not present

## 2018-03-07 DIAGNOSIS — I10 Essential (primary) hypertension: Secondary | ICD-10-CM | POA: Diagnosis not present

## 2018-03-07 DIAGNOSIS — R269 Unspecified abnormalities of gait and mobility: Secondary | ICD-10-CM | POA: Diagnosis not present

## 2018-03-07 DIAGNOSIS — M545 Low back pain: Secondary | ICD-10-CM | POA: Diagnosis not present

## 2018-03-07 DIAGNOSIS — F0390 Unspecified dementia without behavioral disturbance: Secondary | ICD-10-CM | POA: Diagnosis not present

## 2018-04-23 DIAGNOSIS — I10 Essential (primary) hypertension: Secondary | ICD-10-CM | POA: Diagnosis not present

## 2018-04-23 DIAGNOSIS — M545 Low back pain: Secondary | ICD-10-CM | POA: Diagnosis not present

## 2018-04-23 DIAGNOSIS — F0150 Vascular dementia without behavioral disturbance: Secondary | ICD-10-CM | POA: Diagnosis not present

## 2018-04-23 DIAGNOSIS — G894 Chronic pain syndrome: Secondary | ICD-10-CM | POA: Diagnosis not present

## 2018-05-30 DIAGNOSIS — Z79899 Other long term (current) drug therapy: Secondary | ICD-10-CM | POA: Diagnosis not present

## 2018-05-30 DIAGNOSIS — I10 Essential (primary) hypertension: Secondary | ICD-10-CM | POA: Diagnosis not present

## 2018-05-30 DIAGNOSIS — G8929 Other chronic pain: Secondary | ICD-10-CM | POA: Diagnosis not present

## 2018-05-30 DIAGNOSIS — F0390 Unspecified dementia without behavioral disturbance: Secondary | ICD-10-CM | POA: Diagnosis not present

## 2018-05-30 DIAGNOSIS — M545 Low back pain: Secondary | ICD-10-CM | POA: Diagnosis not present

## 2018-05-30 DIAGNOSIS — G2581 Restless legs syndrome: Secondary | ICD-10-CM | POA: Diagnosis not present

## 2018-06-14 DIAGNOSIS — Z79899 Other long term (current) drug therapy: Secondary | ICD-10-CM | POA: Diagnosis not present

## 2018-07-26 DIAGNOSIS — Z79899 Other long term (current) drug therapy: Secondary | ICD-10-CM | POA: Diagnosis not present

## 2018-07-26 DIAGNOSIS — F0390 Unspecified dementia without behavioral disturbance: Secondary | ICD-10-CM | POA: Diagnosis not present

## 2018-07-26 DIAGNOSIS — I10 Essential (primary) hypertension: Secondary | ICD-10-CM | POA: Diagnosis not present

## 2018-07-26 DIAGNOSIS — M545 Low back pain: Secondary | ICD-10-CM | POA: Diagnosis not present

## 2018-07-26 DIAGNOSIS — G8929 Other chronic pain: Secondary | ICD-10-CM | POA: Diagnosis not present

## 2018-07-26 DIAGNOSIS — G2581 Restless legs syndrome: Secondary | ICD-10-CM | POA: Diagnosis not present

## 2018-08-20 DIAGNOSIS — M545 Low back pain: Secondary | ICD-10-CM | POA: Diagnosis not present

## 2018-08-20 DIAGNOSIS — I10 Essential (primary) hypertension: Secondary | ICD-10-CM | POA: Diagnosis not present

## 2018-08-20 DIAGNOSIS — G8929 Other chronic pain: Secondary | ICD-10-CM | POA: Diagnosis not present

## 2018-08-20 DIAGNOSIS — F0151 Vascular dementia with behavioral disturbance: Secondary | ICD-10-CM | POA: Diagnosis not present

## 2018-08-20 DIAGNOSIS — I2510 Atherosclerotic heart disease of native coronary artery without angina pectoris: Secondary | ICD-10-CM | POA: Diagnosis not present

## 2018-08-20 DIAGNOSIS — Z79899 Other long term (current) drug therapy: Secondary | ICD-10-CM | POA: Diagnosis not present

## 2018-09-11 DIAGNOSIS — B351 Tinea unguium: Secondary | ICD-10-CM | POA: Diagnosis not present

## 2018-09-11 DIAGNOSIS — I739 Peripheral vascular disease, unspecified: Secondary | ICD-10-CM | POA: Diagnosis not present

## 2018-09-19 DIAGNOSIS — M545 Low back pain: Secondary | ICD-10-CM | POA: Diagnosis not present

## 2018-09-19 DIAGNOSIS — R269 Unspecified abnormalities of gait and mobility: Secondary | ICD-10-CM | POA: Diagnosis not present

## 2018-09-19 DIAGNOSIS — F0390 Unspecified dementia without behavioral disturbance: Secondary | ICD-10-CM | POA: Diagnosis not present

## 2018-09-19 DIAGNOSIS — I10 Essential (primary) hypertension: Secondary | ICD-10-CM | POA: Diagnosis not present

## 2018-10-06 DIAGNOSIS — Z79899 Other long term (current) drug therapy: Secondary | ICD-10-CM | POA: Diagnosis not present

## 2018-10-06 DIAGNOSIS — I10 Essential (primary) hypertension: Secondary | ICD-10-CM | POA: Diagnosis not present

## 2018-10-11 DIAGNOSIS — Z79899 Other long term (current) drug therapy: Secondary | ICD-10-CM | POA: Diagnosis not present

## 2018-10-11 DIAGNOSIS — I10 Essential (primary) hypertension: Secondary | ICD-10-CM | POA: Diagnosis not present

## 2018-10-15 DIAGNOSIS — I10 Essential (primary) hypertension: Secondary | ICD-10-CM | POA: Diagnosis not present

## 2018-10-15 DIAGNOSIS — M545 Low back pain: Secondary | ICD-10-CM | POA: Diagnosis not present

## 2018-10-15 DIAGNOSIS — R269 Unspecified abnormalities of gait and mobility: Secondary | ICD-10-CM | POA: Diagnosis not present

## 2018-10-15 DIAGNOSIS — F0390 Unspecified dementia without behavioral disturbance: Secondary | ICD-10-CM | POA: Diagnosis not present

## 2018-10-24 DIAGNOSIS — F0390 Unspecified dementia without behavioral disturbance: Secondary | ICD-10-CM | POA: Diagnosis not present

## 2018-10-24 DIAGNOSIS — M545 Low back pain: Secondary | ICD-10-CM | POA: Diagnosis not present

## 2018-10-24 DIAGNOSIS — R269 Unspecified abnormalities of gait and mobility: Secondary | ICD-10-CM | POA: Diagnosis not present

## 2018-10-24 DIAGNOSIS — R634 Abnormal weight loss: Secondary | ICD-10-CM | POA: Diagnosis not present

## 2018-10-24 DIAGNOSIS — Z9114 Patient's other noncompliance with medication regimen: Secondary | ICD-10-CM | POA: Diagnosis not present

## 2018-10-24 DIAGNOSIS — I10 Essential (primary) hypertension: Secondary | ICD-10-CM | POA: Diagnosis not present

## 2018-11-09 ENCOUNTER — Non-Acute Institutional Stay: Payer: PPO | Admitting: Nurse Practitioner

## 2018-11-09 ENCOUNTER — Encounter: Payer: Self-pay | Admitting: Nurse Practitioner

## 2018-11-09 ENCOUNTER — Other Ambulatory Visit: Payer: Self-pay

## 2018-11-09 VITALS — BP 142/78 | HR 70 | Temp 97.9°F | Resp 18 | Wt 170.5 lb

## 2018-11-09 DIAGNOSIS — Z515 Encounter for palliative care: Secondary | ICD-10-CM | POA: Diagnosis not present

## 2018-11-09 NOTE — Progress Notes (Addendum)
Amanda Hogan Consult Note Telephone: [phone]  Fax: [phone]  PATIENT NAME: Amanda Hogan DOB: 1928/07/28 MRN: [id number]  PRIMARY CARE PROVIDER:   Raymondo Band, MD  REFERRING PROVIDER:  Raymondo Band, MD [address] [place],  [place] [address]  RESPONSIBLE PARTY:    Amanda Hogan [id number]; Amanda Hogan daughter [id number]  I was asked by Amanda Fleming NP to see Ms. Amanda Hogan for [facility] consult for [place] and PLAN:  1. ACP: DNR; placed in Vynca; Blank copy MOST form and hard choice book mailed to [facility] for further discussion with family with next PC visit  I spent 60 minutes providing this consultation,  from 11:30am to 12:30pm. More than 50% of the time in this consultation was spent coordinating communication.   HISTORY OF PRESENT ILLNESS:  Amanda Hogan is a 85 y.o. year old female residing at [facility]. See PMH/problem list. Spoke with  Son Amanda Hogan [id number]; and Amanda Hogan daughter [id number] about PMH, chronic disease progression, goals of care, her functional level, cognitive impairment after seeing Amanda Hogan. She was cooperative with assessment. Talked about code status and wishes are DNR.  Therapeutic listening and emotional support provided. Contact information provided. Questions answered to satisfaction. Palliative Care was asked to help address goals of care.   CODE STATUS: DNR  PPS: 40% HOSPICE ELIGIBILITY/DIAGNOSIS: TBD  PAST MEDICAL HISTORY:  Past Medical History:  Diagnosis Date  . Acid reflux   . Angina   . Anxiety 02/23/2012  . Arthritis   . Chest pain    negative lexiscan myoview 53/13, last echo 02/24/12-EF 123456, mid systolic obliteration of the LV, cavity aortic sclerosis  . Complication of anesthesia    "w/my back; gave me too much; thought I'd had stroke but didn't"  . Dementia ([place]) 02/23/2012  . Exertional dyspnea   .  Fibromyalgia   . Heart murmur   . High cholesterol   . HTN (hypertension)   . Hypertension   . Kidney stone   . Memory disorder, possibly not taking home meds at times 07/22/2011  . Mild carotid artery disease ([place]) 08/05/11   carotid dopplers, mild disease  . Restless leg syndrome   . Sinus malignant neoplasm ([place])   . Stroke [facility])    "said it looked like bullet holes; had a bunch"  . TIA (transient ischemic attack)     SOCIAL HX:  Social History   Tobacco Use  . Smoking status: Never Smoker  . Smokeless tobacco: Never Used  Substance Use Topics  . Alcohol use: No    ALLERGIES:  Allergies  Allergen Reactions  . Iohexol Shortness Of Breath and Itching  . Codeine Sulfate Other (See Comments)    Unknown reaction.  . Contrast Media [Iodinated Diagnostic Agents] Other (See Comments)    Reaction:  Unknown      PERTINENT MEDICATIONS:  Outpatient Encounter Medications as of 11/09/2018  Medication Sig  . acetaminophen (TYLENOL) 500 MG tablet Take 1,000 mg by mouth every 8 (eight) hours.   Marland Kitchen amLODipine (NORVASC) 2.5 MG tablet Take 2.5 mg by mouth daily.  Marland Kitchen aspirin (ASPIRIN CHILDRENS) 81 MG chewable tablet Chew 81 mg by mouth every morning.  Marland Kitchen atorvastatin (LIPITOR) 40 MG tablet Take 1 tablet by mouth at bedtime.   . calcium-vitamin D (OSCAL WITH D) 500-200 MG-UNIT tablet Take 1 tablet by mouth 3 (three) times daily before meals.  . clopidogrel (PLAVIX) 75  MG tablet Take 75 mg by mouth daily.  Marland Kitchen escitalopram (LEXAPRO) 5 MG tablet Take 5 mg by mouth at bedtime.   . furosemide (LASIX) 20 MG tablet Take 30 mg by mouth daily.   Marland Kitchen gabapentin (NEURONTIN) 300 MG capsule Take 300 mg by mouth 3 (three) times daily.  Marland Kitchen LORazepam (ATIVAN) 0.5 MG tablet Take 0.5 mg by mouth 2 (two) times daily as needed for anxiety.  . memantine (NAMENDA) 5 MG tablet Take 5 mg by mouth daily.   . metoprolol succinate (TOPROL-XL) 100 MG 24 hr tablet Take 100 mg by mouth daily. Take with or immediately following  a meal.  . ondansetron (ZOFRAN) 4 MG tablet Take 4 mg by mouth every 6 (six) hours as needed for nausea or vomiting.  Marland Kitchen QUEtiapine (SEROQUEL) 25 MG tablet Take 12.5 mg by mouth 2 (two) times daily as needed ((agitation)).   Marland Kitchen ranitidine (ZANTAC) 150 MG tablet Take 150 mg by mouth 2 (two) times daily.   Marland Kitchen rOPINIRole (REQUIP) 1 MG tablet Take 1 mg by mouth 3 (three) times daily.   Marland Kitchen spironolactone (ALDACTONE) 25 MG tablet Take 25 mg by mouth daily.   No facility-administered encounter medications on file as of 11/09/2018.     PHYSICAL EXAM:   General: NAD, obese, pleasant female Cardiovascular: regular rate and rhythm Pulmonary: clear ant fields Abdomen: soft, nontender, + bowel sounds Extremities: +BLE edema, no joint deformities Neurological: Weakness but otherwise nonfocal  Amanda Hogan Ihor Gully, NP

## 2018-11-28 DIAGNOSIS — I10 Essential (primary) hypertension: Secondary | ICD-10-CM | POA: Diagnosis not present

## 2018-11-28 DIAGNOSIS — F0390 Unspecified dementia without behavioral disturbance: Secondary | ICD-10-CM | POA: Diagnosis not present

## 2018-11-28 DIAGNOSIS — M545 Low back pain: Secondary | ICD-10-CM | POA: Diagnosis not present

## 2018-11-28 DIAGNOSIS — Z9114 Patient's other noncompliance with medication regimen: Secondary | ICD-10-CM | POA: Diagnosis not present

## 2018-12-12 ENCOUNTER — Other Ambulatory Visit: Payer: Self-pay

## 2018-12-12 ENCOUNTER — Non-Acute Institutional Stay: Payer: PPO | Admitting: Adult Health Nurse Practitioner

## 2018-12-12 DIAGNOSIS — Z515 Encounter for palliative care: Secondary | ICD-10-CM | POA: Diagnosis not present

## 2018-12-12 NOTE — Progress Notes (Signed)
[facility] Consult Note Telephone: [phone]  Fax: [phone]  PATIENT NAME: Amanda Hogan DOB: 1929/01/01 MRN: [id number]  PRIMARY CARE PROVIDER:   Raymondo Band, MD  REFERRING PROVIDER:  Raymondo Band, MD [address] [place],  [place] [address]  RESPONSIBLE PARTY:   Son Amanda Hogan [phone]; Amanda Hogan daughter [phone]    RECOMMENDATIONS and PLAN:  1.  Advanced care planning.  Patient is a DNR.  Did not make any changes today.  Able to contact daughter, Amanda Hogan, and give her an update on her mother.  Daughter has no new concerns.  2.  Dementia.  FAST 6d.  Patient is able to ambulate with walker.  She requires some assistance with ADLs and is incontinent of B&B.  She is able to feed herself.  No reports of falls, infection, weight loss by staff.  Continue supportive care.  I spent 25 minutes providing this consultation,  from 9:30 to 9:55. More than 50% of the time in this consultation was spent coordinating communication.   HISTORY OF PRESENT ILLNESS:  Amanda Hogan is a 85 y.o. year old female with multiple medical problems including dementia, HTN, OA, CAD, fibromyalgia. Palliative Care was asked to help address goals of care.   CODE STATUS: DNR  PPS: 40% HOSPICE ELIGIBILITY/DIAGNOSIS: TBD  PHYSICAL EXAM:   General:patient sitting in chair in NAD Cardiovascular: regular rate and rhythm Pulmonary: clear ant fields Abdomen: soft, nontender, + bowel sounds GU: no suprapubic tenderness Extremities: no edema, no joint deformities Skin: no rashes Neurological: Weakness; A&O to self  PAST MEDICAL HISTORY:  Past Medical History:  Diagnosis Date  . Acid reflux   . Angina   . Anxiety 02/23/2012  . Arthritis   . Chest pain    negative lexiscan myoview 53/13, last echo 02/24/12-EF 123456, mid systolic obliteration of the LV, cavity aortic sclerosis  . Complication of anesthesia    "w/my back; gave me too much; thought I'd had stroke but didn't"  . Dementia ([place]) 02/23/2012  . Exertional dyspnea   . Fibromyalgia   . Heart murmur   . High cholesterol   . HTN (hypertension)   . Hypertension   . Kidney stone   . Memory disorder, possibly not taking home meds at times 07/22/2011  . Mild carotid artery disease ([place]) 08/05/11   carotid dopplers, mild disease  . Restless leg syndrome   . Sinus malignant neoplasm ([place])   . Stroke [facility])    "said it looked like bullet holes; had a bunch"  . TIA (transient ischemic attack)     SOCIAL HX:  Social History   Tobacco Use  . Smoking status: Never Smoker  . Smokeless tobacco: Never Used  Substance Use Topics  . Alcohol use: No    ALLERGIES:  Allergies  Allergen Reactions  . Iohexol Shortness Of Breath and Itching  . Codeine Sulfate Other (See Comments)    Unknown reaction.  . Contrast Media [Iodinated Diagnostic Agents] Other (See Comments)    Reaction:  Unknown      PERTINENT MEDICATIONS:  Outpatient Encounter Medications as of 12/12/2018  Medication Sig  . acetaminophen (TYLENOL) 500 MG tablet Take 1,000 mg by mouth every 8 (eight) hours.   Marland Kitchen amLODipine (NORVASC) 2.5 MG tablet Take 2.5 mg by mouth daily.  Marland Kitchen aspirin (ASPIRIN CHILDRENS) 81 MG chewable tablet Chew 81 mg by mouth every morning.  Marland Kitchen atorvastatin (LIPITOR) 40 MG tablet Take 1  tablet by mouth at bedtime.   . calcium-vitamin D (OSCAL WITH D) 500-200 MG-UNIT tablet Take 1 tablet by mouth 3 (three) times daily before meals.  . clopidogrel (PLAVIX) 75 MG tablet Take 75 mg by mouth daily.  Marland Kitchen escitalopram (LEXAPRO) 5 MG tablet Take 5 mg by mouth at bedtime.   . furosemide (LASIX) 20 MG tablet Take 30 mg by mouth daily.   Marland Kitchen gabapentin (NEURONTIN) 300 MG capsule Take 300 mg by mouth 3 (three) times daily.  Marland Kitchen LORazepam (ATIVAN) 0.5 MG tablet Take 0.5 mg by mouth 2 (two) times daily as needed for anxiety.  . memantine (NAMENDA) 5 MG tablet Take 5 mg by mouth daily.    . metoprolol succinate (TOPROL-XL) 100 MG 24 hr tablet Take 100 mg by mouth daily. Take with or immediately following a meal.  . ondansetron (ZOFRAN) 4 MG tablet Take 4 mg by mouth every 6 (six) hours as needed for nausea or vomiting.  Marland Kitchen QUEtiapine (SEROQUEL) 25 MG tablet Take 12.5 mg by mouth 2 (two) times daily as needed ((agitation)).   Marland Kitchen ranitidine (ZANTAC) 150 MG tablet Take 150 mg by mouth 2 (two) times daily.   Marland Kitchen rOPINIRole (REQUIP) 1 MG tablet Take 1 mg by mouth 3 (three) times daily.   Marland Kitchen spironolactone (ALDACTONE) 25 MG tablet Take 25 mg by mouth daily.   No facility-administered encounter medications on file as of 12/12/2018.       Rojelio [place] Jenetta Downer, NP

## 2019-02-08 DIAGNOSIS — I739 Peripheral vascular disease, unspecified: Secondary | ICD-10-CM | POA: Diagnosis not present

## 2019-02-08 DIAGNOSIS — B351 Tinea unguium: Secondary | ICD-10-CM | POA: Diagnosis not present

## 2019-02-08 DIAGNOSIS — L84 Corns and callosities: Secondary | ICD-10-CM | POA: Diagnosis not present

## 2019-02-08 DIAGNOSIS — R269 Unspecified abnormalities of gait and mobility: Secondary | ICD-10-CM | POA: Diagnosis not present

## 2019-03-07 DIAGNOSIS — E782 Mixed hyperlipidemia: Secondary | ICD-10-CM | POA: Diagnosis not present

## 2019-03-07 DIAGNOSIS — R269 Unspecified abnormalities of gait and mobility: Secondary | ICD-10-CM | POA: Diagnosis not present

## 2019-03-07 DIAGNOSIS — I10 Essential (primary) hypertension: Secondary | ICD-10-CM | POA: Diagnosis not present

## 2019-03-18 DIAGNOSIS — E785 Hyperlipidemia, unspecified: Secondary | ICD-10-CM | POA: Diagnosis not present

## 2019-03-18 DIAGNOSIS — E538 Deficiency of other specified B group vitamins: Secondary | ICD-10-CM | POA: Diagnosis not present

## 2019-03-18 DIAGNOSIS — R634 Abnormal weight loss: Secondary | ICD-10-CM | POA: Diagnosis not present

## 2019-03-18 DIAGNOSIS — N182 Chronic kidney disease, stage 2 (mild): Secondary | ICD-10-CM | POA: Diagnosis not present

## 2019-03-18 DIAGNOSIS — F0151 Vascular dementia with behavioral disturbance: Secondary | ICD-10-CM | POA: Diagnosis not present

## 2019-03-18 DIAGNOSIS — R269 Unspecified abnormalities of gait and mobility: Secondary | ICD-10-CM | POA: Diagnosis not present

## 2019-03-18 DIAGNOSIS — I10 Essential (primary) hypertension: Secondary | ICD-10-CM | POA: Diagnosis not present

## 2019-03-25 DIAGNOSIS — E538 Deficiency of other specified B group vitamins: Secondary | ICD-10-CM | POA: Diagnosis not present

## 2019-03-25 DIAGNOSIS — R634 Abnormal weight loss: Secondary | ICD-10-CM | POA: Diagnosis not present

## 2019-03-25 DIAGNOSIS — R269 Unspecified abnormalities of gait and mobility: Secondary | ICD-10-CM | POA: Diagnosis not present

## 2019-03-25 DIAGNOSIS — Z9114 Patient's other noncompliance with medication regimen: Secondary | ICD-10-CM | POA: Diagnosis not present

## 2019-03-25 DIAGNOSIS — I679 Cerebrovascular disease, unspecified: Secondary | ICD-10-CM | POA: Diagnosis not present

## 2019-03-25 DIAGNOSIS — F0151 Vascular dementia with behavioral disturbance: Secondary | ICD-10-CM | POA: Diagnosis not present

## 2019-03-25 DIAGNOSIS — I10 Essential (primary) hypertension: Secondary | ICD-10-CM | POA: Diagnosis not present

## 2019-04-05 DIAGNOSIS — E669 Obesity, unspecified: Secondary | ICD-10-CM | POA: Diagnosis not present

## 2019-04-05 DIAGNOSIS — I272 Pulmonary hypertension, unspecified: Secondary | ICD-10-CM | POA: Diagnosis not present

## 2019-04-05 DIAGNOSIS — G8929 Other chronic pain: Secondary | ICD-10-CM | POA: Diagnosis not present

## 2019-04-05 DIAGNOSIS — N189 Chronic kidney disease, unspecified: Secondary | ICD-10-CM | POA: Diagnosis not present

## 2019-04-05 DIAGNOSIS — I130 Hypertensive heart and chronic kidney disease with heart failure and stage 1 through stage 4 chronic kidney disease, or unspecified chronic kidney disease: Secondary | ICD-10-CM | POA: Diagnosis not present

## 2019-04-05 DIAGNOSIS — E538 Deficiency of other specified B group vitamins: Secondary | ICD-10-CM | POA: Diagnosis not present

## 2019-04-05 DIAGNOSIS — D631 Anemia in chronic kidney disease: Secondary | ICD-10-CM | POA: Diagnosis not present

## 2019-04-05 DIAGNOSIS — M799 Soft tissue disorder, unspecified: Secondary | ICD-10-CM | POA: Diagnosis not present

## 2019-04-05 DIAGNOSIS — K219 Gastro-esophageal reflux disease without esophagitis: Secondary | ICD-10-CM | POA: Diagnosis not present

## 2019-04-05 DIAGNOSIS — F419 Anxiety disorder, unspecified: Secondary | ICD-10-CM | POA: Diagnosis not present

## 2019-04-05 DIAGNOSIS — F0151 Vascular dementia with behavioral disturbance: Secondary | ICD-10-CM | POA: Diagnosis not present

## 2019-04-05 DIAGNOSIS — G2581 Restless legs syndrome: Secondary | ICD-10-CM | POA: Diagnosis not present

## 2019-04-05 DIAGNOSIS — I5031 Acute diastolic (congestive) heart failure: Secondary | ICD-10-CM | POA: Diagnosis not present

## 2019-04-05 DIAGNOSIS — F0281 Dementia in other diseases classified elsewhere with behavioral disturbance: Secondary | ICD-10-CM | POA: Diagnosis not present

## 2019-04-05 DIAGNOSIS — M549 Dorsalgia, unspecified: Secondary | ICD-10-CM | POA: Diagnosis not present

## 2019-04-05 DIAGNOSIS — I4891 Unspecified atrial fibrillation: Secondary | ICD-10-CM | POA: Diagnosis not present

## 2019-04-05 DIAGNOSIS — E785 Hyperlipidemia, unspecified: Secondary | ICD-10-CM | POA: Diagnosis not present

## 2019-04-05 DIAGNOSIS — M1990 Unspecified osteoarthritis, unspecified site: Secondary | ICD-10-CM | POA: Diagnosis not present

## 2019-04-08 DIAGNOSIS — F419 Anxiety disorder, unspecified: Secondary | ICD-10-CM | POA: Diagnosis not present

## 2019-04-08 DIAGNOSIS — I272 Pulmonary hypertension, unspecified: Secondary | ICD-10-CM | POA: Diagnosis not present

## 2019-04-08 DIAGNOSIS — I130 Hypertensive heart and chronic kidney disease with heart failure and stage 1 through stage 4 chronic kidney disease, or unspecified chronic kidney disease: Secondary | ICD-10-CM | POA: Diagnosis not present

## 2019-04-08 DIAGNOSIS — Z8571 Personal history of Hodgkin lymphoma: Secondary | ICD-10-CM | POA: Diagnosis not present

## 2019-04-08 DIAGNOSIS — G8929 Other chronic pain: Secondary | ICD-10-CM | POA: Diagnosis not present

## 2019-04-08 DIAGNOSIS — G2581 Restless legs syndrome: Secondary | ICD-10-CM | POA: Diagnosis not present

## 2019-04-08 DIAGNOSIS — D631 Anemia in chronic kidney disease: Secondary | ICD-10-CM | POA: Diagnosis not present

## 2019-04-08 DIAGNOSIS — E538 Deficiency of other specified B group vitamins: Secondary | ICD-10-CM | POA: Diagnosis not present

## 2019-04-08 DIAGNOSIS — M1990 Unspecified osteoarthritis, unspecified site: Secondary | ICD-10-CM | POA: Diagnosis not present

## 2019-04-08 DIAGNOSIS — I779 Disorder of arteries and arterioles, unspecified: Secondary | ICD-10-CM | POA: Diagnosis not present

## 2019-04-08 DIAGNOSIS — I4891 Unspecified atrial fibrillation: Secondary | ICD-10-CM | POA: Diagnosis not present

## 2019-04-08 DIAGNOSIS — Z7982 Long term (current) use of aspirin: Secondary | ICD-10-CM | POA: Diagnosis not present

## 2019-04-08 DIAGNOSIS — Z8673 Personal history of transient ischemic attack (TIA), and cerebral infarction without residual deficits: Secondary | ICD-10-CM | POA: Diagnosis not present

## 2019-04-08 DIAGNOSIS — K219 Gastro-esophageal reflux disease without esophagitis: Secondary | ICD-10-CM | POA: Diagnosis not present

## 2019-04-08 DIAGNOSIS — Z7902 Long term (current) use of antithrombotics/antiplatelets: Secondary | ICD-10-CM | POA: Diagnosis not present

## 2019-04-08 DIAGNOSIS — M549 Dorsalgia, unspecified: Secondary | ICD-10-CM | POA: Diagnosis not present

## 2019-04-08 DIAGNOSIS — E669 Obesity, unspecified: Secondary | ICD-10-CM | POA: Diagnosis not present

## 2019-04-08 DIAGNOSIS — F0151 Vascular dementia with behavioral disturbance: Secondary | ICD-10-CM | POA: Diagnosis not present

## 2019-04-08 DIAGNOSIS — F0281 Dementia in other diseases classified elsewhere with behavioral disturbance: Secondary | ICD-10-CM | POA: Diagnosis not present

## 2019-04-08 DIAGNOSIS — I5031 Acute diastolic (congestive) heart failure: Secondary | ICD-10-CM | POA: Diagnosis not present

## 2019-04-08 DIAGNOSIS — M799 Soft tissue disorder, unspecified: Secondary | ICD-10-CM | POA: Diagnosis not present

## 2019-04-08 DIAGNOSIS — Z6841 Body Mass Index (BMI) 40.0 and over, adult: Secondary | ICD-10-CM | POA: Diagnosis not present

## 2019-04-08 DIAGNOSIS — E785 Hyperlipidemia, unspecified: Secondary | ICD-10-CM | POA: Diagnosis not present

## 2019-04-08 DIAGNOSIS — N189 Chronic kidney disease, unspecified: Secondary | ICD-10-CM | POA: Diagnosis not present

## 2019-04-11 DIAGNOSIS — L84 Corns and callosities: Secondary | ICD-10-CM | POA: Diagnosis not present

## 2019-04-11 DIAGNOSIS — B351 Tinea unguium: Secondary | ICD-10-CM | POA: Diagnosis not present

## 2019-04-11 DIAGNOSIS — R269 Unspecified abnormalities of gait and mobility: Secondary | ICD-10-CM | POA: Diagnosis not present

## 2019-04-11 DIAGNOSIS — I739 Peripheral vascular disease, unspecified: Secondary | ICD-10-CM | POA: Diagnosis not present

## 2019-04-22 DIAGNOSIS — K219 Gastro-esophageal reflux disease without esophagitis: Secondary | ICD-10-CM | POA: Diagnosis not present

## 2019-04-22 DIAGNOSIS — G8929 Other chronic pain: Secondary | ICD-10-CM | POA: Diagnosis not present

## 2019-04-22 DIAGNOSIS — Z8571 Personal history of Hodgkin lymphoma: Secondary | ICD-10-CM | POA: Diagnosis not present

## 2019-04-22 DIAGNOSIS — Z7902 Long term (current) use of antithrombotics/antiplatelets: Secondary | ICD-10-CM | POA: Diagnosis not present

## 2019-04-22 DIAGNOSIS — E538 Deficiency of other specified B group vitamins: Secondary | ICD-10-CM | POA: Diagnosis not present

## 2019-04-22 DIAGNOSIS — Z7982 Long term (current) use of aspirin: Secondary | ICD-10-CM | POA: Diagnosis not present

## 2019-04-22 DIAGNOSIS — I130 Hypertensive heart and chronic kidney disease with heart failure and stage 1 through stage 4 chronic kidney disease, or unspecified chronic kidney disease: Secondary | ICD-10-CM | POA: Diagnosis not present

## 2019-04-22 DIAGNOSIS — G2581 Restless legs syndrome: Secondary | ICD-10-CM | POA: Diagnosis not present

## 2019-04-22 DIAGNOSIS — F419 Anxiety disorder, unspecified: Secondary | ICD-10-CM | POA: Diagnosis not present

## 2019-04-22 DIAGNOSIS — M799 Soft tissue disorder, unspecified: Secondary | ICD-10-CM | POA: Diagnosis not present

## 2019-04-22 DIAGNOSIS — D631 Anemia in chronic kidney disease: Secondary | ICD-10-CM | POA: Diagnosis not present

## 2019-04-22 DIAGNOSIS — Z8673 Personal history of transient ischemic attack (TIA), and cerebral infarction without residual deficits: Secondary | ICD-10-CM | POA: Diagnosis not present

## 2019-04-22 DIAGNOSIS — F0151 Vascular dementia with behavioral disturbance: Secondary | ICD-10-CM | POA: Diagnosis not present

## 2019-04-22 DIAGNOSIS — I5031 Acute diastolic (congestive) heart failure: Secondary | ICD-10-CM | POA: Diagnosis not present

## 2019-04-22 DIAGNOSIS — I779 Disorder of arteries and arterioles, unspecified: Secondary | ICD-10-CM | POA: Diagnosis not present

## 2019-04-22 DIAGNOSIS — Z6841 Body Mass Index (BMI) 40.0 and over, adult: Secondary | ICD-10-CM | POA: Diagnosis not present

## 2019-04-22 DIAGNOSIS — M549 Dorsalgia, unspecified: Secondary | ICD-10-CM | POA: Diagnosis not present

## 2019-04-22 DIAGNOSIS — I4891 Unspecified atrial fibrillation: Secondary | ICD-10-CM | POA: Diagnosis not present

## 2019-04-22 DIAGNOSIS — E669 Obesity, unspecified: Secondary | ICD-10-CM | POA: Diagnosis not present

## 2019-04-22 DIAGNOSIS — E785 Hyperlipidemia, unspecified: Secondary | ICD-10-CM | POA: Diagnosis not present

## 2019-04-22 DIAGNOSIS — I272 Pulmonary hypertension, unspecified: Secondary | ICD-10-CM | POA: Diagnosis not present

## 2019-04-22 DIAGNOSIS — F0281 Dementia in other diseases classified elsewhere with behavioral disturbance: Secondary | ICD-10-CM | POA: Diagnosis not present

## 2019-04-22 DIAGNOSIS — M1990 Unspecified osteoarthritis, unspecified site: Secondary | ICD-10-CM | POA: Diagnosis not present

## 2019-04-22 DIAGNOSIS — N189 Chronic kidney disease, unspecified: Secondary | ICD-10-CM | POA: Diagnosis not present

## 2019-04-24 DIAGNOSIS — Z20828 Contact with and (suspected) exposure to other viral communicable diseases: Secondary | ICD-10-CM | POA: Diagnosis not present

## 2019-04-30 DIAGNOSIS — Z20828 Contact with and (suspected) exposure to other viral communicable diseases: Secondary | ICD-10-CM | POA: Diagnosis not present

## 2019-05-07 DIAGNOSIS — Z8673 Personal history of transient ischemic attack (TIA), and cerebral infarction without residual deficits: Secondary | ICD-10-CM | POA: Diagnosis not present

## 2019-05-07 DIAGNOSIS — F0281 Dementia in other diseases classified elsewhere with behavioral disturbance: Secondary | ICD-10-CM | POA: Diagnosis not present

## 2019-05-07 DIAGNOSIS — M799 Soft tissue disorder, unspecified: Secondary | ICD-10-CM | POA: Diagnosis not present

## 2019-05-07 DIAGNOSIS — F419 Anxiety disorder, unspecified: Secondary | ICD-10-CM | POA: Diagnosis not present

## 2019-05-07 DIAGNOSIS — K219 Gastro-esophageal reflux disease without esophagitis: Secondary | ICD-10-CM | POA: Diagnosis not present

## 2019-05-07 DIAGNOSIS — I5031 Acute diastolic (congestive) heart failure: Secondary | ICD-10-CM | POA: Diagnosis not present

## 2019-05-07 DIAGNOSIS — D631 Anemia in chronic kidney disease: Secondary | ICD-10-CM | POA: Diagnosis not present

## 2019-05-07 DIAGNOSIS — I779 Disorder of arteries and arterioles, unspecified: Secondary | ICD-10-CM | POA: Diagnosis not present

## 2019-05-07 DIAGNOSIS — I130 Hypertensive heart and chronic kidney disease with heart failure and stage 1 through stage 4 chronic kidney disease, or unspecified chronic kidney disease: Secondary | ICD-10-CM | POA: Diagnosis not present

## 2019-05-07 DIAGNOSIS — G8929 Other chronic pain: Secondary | ICD-10-CM | POA: Diagnosis not present

## 2019-05-07 DIAGNOSIS — N189 Chronic kidney disease, unspecified: Secondary | ICD-10-CM | POA: Diagnosis not present

## 2019-05-07 DIAGNOSIS — Z20828 Contact with and (suspected) exposure to other viral communicable diseases: Secondary | ICD-10-CM | POA: Diagnosis not present

## 2019-05-07 DIAGNOSIS — Z8571 Personal history of Hodgkin lymphoma: Secondary | ICD-10-CM | POA: Diagnosis not present

## 2019-05-07 DIAGNOSIS — E785 Hyperlipidemia, unspecified: Secondary | ICD-10-CM | POA: Diagnosis not present

## 2019-05-07 DIAGNOSIS — I4891 Unspecified atrial fibrillation: Secondary | ICD-10-CM | POA: Diagnosis not present

## 2019-05-07 DIAGNOSIS — I272 Pulmonary hypertension, unspecified: Secondary | ICD-10-CM | POA: Diagnosis not present

## 2019-05-07 DIAGNOSIS — Z7982 Long term (current) use of aspirin: Secondary | ICD-10-CM | POA: Diagnosis not present

## 2019-05-07 DIAGNOSIS — Z7902 Long term (current) use of antithrombotics/antiplatelets: Secondary | ICD-10-CM | POA: Diagnosis not present

## 2019-05-07 DIAGNOSIS — E669 Obesity, unspecified: Secondary | ICD-10-CM | POA: Diagnosis not present

## 2019-05-07 DIAGNOSIS — F0151 Vascular dementia with behavioral disturbance: Secondary | ICD-10-CM | POA: Diagnosis not present

## 2019-05-07 DIAGNOSIS — M549 Dorsalgia, unspecified: Secondary | ICD-10-CM | POA: Diagnosis not present

## 2019-05-07 DIAGNOSIS — E538 Deficiency of other specified B group vitamins: Secondary | ICD-10-CM | POA: Diagnosis not present

## 2019-05-07 DIAGNOSIS — M1990 Unspecified osteoarthritis, unspecified site: Secondary | ICD-10-CM | POA: Diagnosis not present

## 2019-05-07 DIAGNOSIS — Z6841 Body Mass Index (BMI) 40.0 and over, adult: Secondary | ICD-10-CM | POA: Diagnosis not present

## 2019-05-07 DIAGNOSIS — G2581 Restless legs syndrome: Secondary | ICD-10-CM | POA: Diagnosis not present

## 2019-05-13 DIAGNOSIS — E538 Deficiency of other specified B group vitamins: Secondary | ICD-10-CM | POA: Diagnosis not present

## 2019-05-13 DIAGNOSIS — N182 Chronic kidney disease, stage 2 (mild): Secondary | ICD-10-CM | POA: Diagnosis not present

## 2019-05-13 DIAGNOSIS — R634 Abnormal weight loss: Secondary | ICD-10-CM | POA: Diagnosis not present

## 2019-05-13 DIAGNOSIS — I2510 Atherosclerotic heart disease of native coronary artery without angina pectoris: Secondary | ICD-10-CM | POA: Diagnosis not present

## 2019-05-13 DIAGNOSIS — F0151 Vascular dementia with behavioral disturbance: Secondary | ICD-10-CM | POA: Diagnosis not present

## 2019-05-13 DIAGNOSIS — R269 Unspecified abnormalities of gait and mobility: Secondary | ICD-10-CM | POA: Diagnosis not present

## 2019-05-13 DIAGNOSIS — I10 Essential (primary) hypertension: Secondary | ICD-10-CM | POA: Diagnosis not present

## 2019-05-13 DIAGNOSIS — Z9114 Patient's other noncompliance with medication regimen: Secondary | ICD-10-CM | POA: Diagnosis not present

## 2019-05-13 DIAGNOSIS — D510 Vitamin B12 deficiency anemia due to intrinsic factor deficiency: Secondary | ICD-10-CM | POA: Diagnosis not present

## 2019-05-14 DIAGNOSIS — Z20828 Contact with and (suspected) exposure to other viral communicable diseases: Secondary | ICD-10-CM | POA: Diagnosis not present

## 2019-05-16 DIAGNOSIS — N189 Chronic kidney disease, unspecified: Secondary | ICD-10-CM | POA: Diagnosis not present

## 2019-05-16 DIAGNOSIS — I5031 Acute diastolic (congestive) heart failure: Secondary | ICD-10-CM | POA: Diagnosis not present

## 2019-05-16 DIAGNOSIS — I130 Hypertensive heart and chronic kidney disease with heart failure and stage 1 through stage 4 chronic kidney disease, or unspecified chronic kidney disease: Secondary | ICD-10-CM | POA: Diagnosis not present

## 2019-05-18 ENCOUNTER — Other Ambulatory Visit: Payer: Self-pay

## 2019-05-18 ENCOUNTER — Emergency Department: Payer: PPO

## 2019-05-18 ENCOUNTER — Encounter: Payer: Self-pay | Admitting: Emergency Medicine

## 2019-05-18 DIAGNOSIS — I5030 Unspecified diastolic (congestive) heart failure: Secondary | ICD-10-CM | POA: Diagnosis not present

## 2019-05-18 DIAGNOSIS — Z79899 Other long term (current) drug therapy: Secondary | ICD-10-CM | POA: Insufficient documentation

## 2019-05-18 DIAGNOSIS — R5381 Other malaise: Secondary | ICD-10-CM | POA: Diagnosis not present

## 2019-05-18 DIAGNOSIS — K219 Gastro-esophageal reflux disease without esophagitis: Secondary | ICD-10-CM | POA: Insufficient documentation

## 2019-05-18 DIAGNOSIS — Z8673 Personal history of transient ischemic attack (TIA), and cerebral infarction without residual deficits: Secondary | ICD-10-CM | POA: Diagnosis not present

## 2019-05-18 DIAGNOSIS — Z7902 Long term (current) use of antithrombotics/antiplatelets: Secondary | ICD-10-CM | POA: Diagnosis not present

## 2019-05-18 DIAGNOSIS — R079 Chest pain, unspecified: Secondary | ICD-10-CM | POA: Diagnosis not present

## 2019-05-18 DIAGNOSIS — Z7982 Long term (current) use of aspirin: Secondary | ICD-10-CM | POA: Insufficient documentation

## 2019-05-18 DIAGNOSIS — R001 Bradycardia, unspecified: Secondary | ICD-10-CM | POA: Diagnosis not present

## 2019-05-18 DIAGNOSIS — I110 Hypertensive heart disease with heart failure: Secondary | ICD-10-CM | POA: Diagnosis not present

## 2019-05-18 DIAGNOSIS — R0789 Other chest pain: Secondary | ICD-10-CM | POA: Diagnosis not present

## 2019-05-18 DIAGNOSIS — R070 Pain in throat: Secondary | ICD-10-CM | POA: Diagnosis present

## 2019-05-18 DIAGNOSIS — Z9049 Acquired absence of other specified parts of digestive tract: Secondary | ICD-10-CM | POA: Diagnosis not present

## 2019-05-18 LAB — BASIC METABOLIC PANEL
Anion gap: 13 (ref 5–15)
BUN: 31 mg/dL — ABNORMAL HIGH (ref 8–23)
CO2: 25 mmol/L (ref 22–32)
Calcium: 8.8 mg/dL — ABNORMAL LOW (ref 8.9–10.3)
Chloride: 98 mmol/L (ref 98–111)
Creatinine, Ser: 1.18 mg/dL — ABNORMAL HIGH (ref 0.44–1.00)
GFR calc Af Amer: 48 mL/min — ABNORMAL LOW (ref >60)
GFR calc non Af Amer: 41 mL/min — ABNORMAL LOW (ref >60)
Glucose, Bld: 102 mg/dL — ABNORMAL HIGH (ref 70–99)
Potassium: 4.3 mmol/L (ref 3.5–5.1)
Sodium: 136 mmol/L (ref 135–145)

## 2019-05-18 LAB — CBC
HCT: 36.4 % (ref 36.0–46.0)
Hemoglobin: 11.7 g/dL — ABNORMAL LOW (ref 12.0–15.0)
MCH: 30.2 pg (ref 26.0–34.0)
MCHC: 32.1 g/dL (ref 30.0–36.0)
MCV: 93.8 fL (ref 80.0–100.0)
Platelets: 257 10*3/uL (ref 150–400)
RBC: 3.88 MIL/uL (ref 3.87–5.11)
RDW: 12.8 % (ref 11.5–15.5)
WBC: 8.7 10*3/uL (ref 4.0–10.5)
nRBC: 0 % (ref 0.0–0.2)

## 2019-05-18 LAB — TROPONIN I (HIGH SENSITIVITY): Troponin I (High Sensitivity): 5 ng/L (ref <18)

## 2019-05-18 NOTE — ED Triage Notes (Signed)
First Nurse Note - EMS report:  Patient coming [facility] from [place] for burning throat. patient has hx of GERD. Patient refused to take acid reflux medication. Patient's symptoms resolved in transport. Patient has hx of dementia, GERD, IBS.   BP 150/68, HR 50, 98% on RA.

## 2019-05-18 NOTE — ED Triage Notes (Signed)
Patient brought in by ems. Patient with complaint of burning in her chest for several days.

## 2019-05-19 ENCOUNTER — Emergency Department
Admission: EM | Admit: 2019-05-19 | Discharge: 2019-05-19 | Disposition: A | Discharge status: Home / Self Care | Payer: PPO | Attending: Emergency Medicine | Admitting: Emergency Medicine

## 2019-05-19 DIAGNOSIS — K219 Gastro-esophageal reflux disease without esophagitis: Secondary | ICD-10-CM

## 2019-05-19 LAB — HEPATIC FUNCTION PANEL
ALT: 13 U/L (ref 0–44)
AST: 16 U/L (ref 15–41)
Albumin: 4.2 g/dL (ref 3.5–5.0)
Alkaline Phosphatase: 66 U/L (ref 38–126)
Bilirubin, Direct: <0.1 mg/dL (ref 0.0–0.2)
Total Bilirubin: 0.8 mg/dL (ref 0.3–1.2)
Total Protein: 8 g/dL (ref 6.5–8.1)

## 2019-05-19 LAB — TROPONIN I (HIGH SENSITIVITY): Troponin I (High Sensitivity): 4 ng/L (ref <18)

## 2019-05-19 LAB — LIPASE, BLOOD: Lipase: 29 U/L (ref 11–51)

## 2019-05-19 NOTE — ED Notes (Signed)
[facility]; no transportation is available.  Will arrange for EMS to take pt back to [facility].

## 2019-05-19 NOTE — ED Notes (Signed)
Dawn RN sitting with pt to prevent fall.

## 2019-05-19 NOTE — ED Provider Notes (Signed)
[facility] Emergency Department Provider Note   ____________________________________________   First MD Initiated Contact with Patient 05/19/19 0131     (approximate)  I have reviewed the triage vital signs and the nursing notes.   HISTORY  Chief Complaint Chest Pain  Level V caveat: Limited by dementia  HPI Amanda Hogan is a 86 y.o. female brought to the ED via EMS from [place] for a chief complaint of burning throat.  Patient has a history of GERD, hypertension, angina and reportedly refused to take her acid reflux medication.  Patient symptoms resolved during transport.  Denies fever, cough, chest pain, shortness of breath, abdominal pain, nausea, vomiting, palpitations or dizziness.       Past Medical History:  Diagnosis Date  . Acid reflux   . Angina   . Anxiety 02/23/2012  . Arthritis   . Chest pain    negative lexiscan myoview 53/13, last echo 02/24/12-EF 123456, mid systolic obliteration of the LV, cavity aortic sclerosis  . Complication of anesthesia    "w/my back; gave me too much; thought I'd had stroke but didn't"  . Dementia ([place]) 02/23/2012  . Exertional dyspnea   . Fibromyalgia   . Heart murmur   . High cholesterol   . HTN (hypertension)   . Hypertension   . Kidney stone   . Memory disorder, possibly not taking home meds at times 07/22/2011  . Mild carotid artery disease ([place]) 08/05/11   carotid dopplers, mild disease  . Restless leg syndrome   . Sinus malignant neoplasm ([place])   . Stroke [facility])    "said it looked like bullet holes; had a bunch"  . TIA (transient ischemic attack)     Patient Active Problem List   Diagnosis Date Noted  . Acute kidney injury ([place]) 12/27/2014  . Acute CVA (cerebrovascular accident) ([place]) 12/16/2014  . Diastolic dysfunction-grade 2 10/29/2014  . Pulmonary hypertension-moderate 10/29/2014  . Troponin level elevated-suspect demand ischemia from AF with RVR 10/28/2014  . Lymphoma history  10/28/2014  . Acute diastolic CHF (congestive heart failure) ([place]) 10/27/2014  . Atrial fibrillation with RVR (new)   . TIA (transient ischemic attack) 02/25/2012  . Encephalopathy acute 02/23/2012  . Anemia 02/23/2012  . Chronic back pain 02/23/2012  . Anxiety 02/23/2012  . Mild carotid artery disease ([place]) 08/05/2011  . Memory disorder, possibly not taking home meds at times 07/22/2011  . Hypertension, at times poorly controlled 07/21/2011  . Chest pain-low risk Myoview May 2013 07/21/2011  . Hyperlipidemia 07/21/2011  . Gastroesophageal reflux disease 07/21/2011  . History of TIA (transient ischemic attack) 07/21/2011  . Obesity 07/21/2011    Past Surgical History:  Procedure Laterality Date  . BREAST CYST EXCISION     left  . CARPAL TUNNEL RELEASE     right  . CATARACT EXTRACTION W/ INTRAOCULAR LENS  IMPLANT, BILATERAL    . CHOLECYSTECTOMY  2005  . ELBOW BURSA SURGERY     left  . EYE SURGERY  1940   fixed my crossed eyes"  . HERNIA REPAIR  2007   "stomach"  . KIDNEY STONE SURGERY     "right side; cut it out"  . KNEE CARTILAGE SURGERY     right  . LUMBAR SPINE SURGERY     "put box in my lower back"  . LUNG SURGERY     "made 3 holes and pulled gel out"  . VAGINAL HYSTERECTOMY      Prior to Admission medications  Medication Sig Start Date End Date Taking? Authorizing Provider  acetaminophen (TYLENOL) 500 MG tablet Take 1,000 mg by mouth every 8 (eight) hours.     [provider]  amLODipine (NORVASC) 2.5 MG tablet Take 2.5 mg by mouth daily.    [provider]  aspirin (ASPIRIN CHILDRENS) 81 MG chewable tablet Chew 81 mg by mouth every morning.    [provider]  atorvastatin (LIPITOR) 40 MG tablet Take 1 tablet by mouth at bedtime.  04/25/15   [provider]  calcium-vitamin D (OSCAL WITH D) 500-200 MG-UNIT tablet Take 1 tablet by mouth 3 (three) times daily before meals.    [provider]  clopidogrel (PLAVIX) 75 MG  tablet Take 75 mg by mouth daily.    [provider]  escitalopram (LEXAPRO) 5 MG tablet Take 5 mg by mouth at bedtime.     [provider]  furosemide (LASIX) 20 MG tablet Take 30 mg by mouth daily.     [provider]  gabapentin (NEURONTIN) 300 MG capsule Take 300 mg by mouth 3 (three) times daily.    [provider]  LORazepam (ATIVAN) 0.5 MG tablet Take 0.5 mg by mouth 2 (two) times daily as needed for anxiety.    [provider]  memantine (NAMENDA) 5 MG tablet Take 5 mg by mouth daily.     [provider]  metoprolol succinate (TOPROL-XL) 100 MG 24 hr tablet Take 100 mg by mouth daily. Take with or immediately following a meal.    [provider]  ondansetron (ZOFRAN) 4 MG tablet Take 4 mg by mouth every 6 (six) hours as needed for nausea or vomiting.    [provider]  QUEtiapine (SEROQUEL) 25 MG tablet Take 12.5 mg by mouth 2 (two) times daily as needed ((agitation)).     [provider]  ranitidine (ZANTAC) 150 MG tablet Take 150 mg by mouth 2 (two) times daily.     [provider]  rOPINIRole (REQUIP) 1 MG tablet Take 1 mg by mouth 3 (three) times daily.     [provider]  spironolactone (ALDACTONE) 25 MG tablet Take 25 mg by mouth daily.    [provider]    Allergies Iohexol, Codeine sulfate, and Contrast media [iodinated diagnostic agents]  Family History  Problem Relation Age of Onset  . Diabetes Neg Hx     Social History Social History   Tobacco Use  . Smoking status: Never Smoker  . Smokeless tobacco: Never Used  Substance Use Topics  . Alcohol use: No  . Drug use: No    Review of Systems  Constitutional: No fever/chills Eyes: No visual changes. ENT: Positive for burning throat.  No sore throat. Cardiovascular: Denies chest pain. Respiratory: Denies shortness of breath. Gastrointestinal: No abdominal pain.  No nausea, no vomiting.  No diarrhea.  No  constipation. Genitourinary: Negative for dysuria. Musculoskeletal: Negative for back pain. Skin: Negative for rash. Neurological: Negative for headaches, focal weakness or numbness.   ____________________________________________   PHYSICAL EXAM:  VITAL SIGNS: ED Triage Vitals  Enc Vitals Group     BP 05/18/19 2249 139/63     Pulse Rate 05/18/19 2249 (!) 51     Resp 05/18/19 2249 18     Temp 05/18/19 2249 98.8 F (37.1 C)     Temp Source 05/18/19 2249 Oral     SpO2 05/18/19 2249 95 %     Weight 05/18/19 2251 169 lb 12.1 oz (77 kg)  Height 05/18/19 2251 5\' 6"  (1.676 m)     Head Circumference --      Peak Flow --      Pain Score --      Pain Loc --      Pain Edu? --      Excl. in [place]? --     Constitutional: Alert and oriented. Well appearing and in no acute distress. Eyes: Conjunctivae are normal. PERRL. EOMI. Head: Atraumatic. Nose: No congestion/rhinnorhea. Mouth/Throat: Mucous membranes are moist.  Oropharynx non-erythematous. Neck: No stridor.   Cardiovascular: Normal rate, regular rhythm. Grossly normal heart sounds.  Good peripheral circulation. Respiratory: Normal respiratory effort.  No retractions. Lungs CTAB. Gastrointestinal: Soft and nontender to light or deep palpation. No distention. No abdominal bruits. No CVA tenderness. Musculoskeletal: No lower extremity tenderness nor edema.  No joint effusions. Neurologic:  Normal speech and language. No gross focal neurologic deficits are appreciated.  Skin:  Skin is warm, dry and intact. No rash noted. Psychiatric: Mood and affect are normal. Speech and behavior are normal.  ____________________________________________   LABS (all labs ordered are listed, but only abnormal results are displayed)  Labs Reviewed  BASIC METABOLIC PANEL - Abnormal; Notable for the following components:      Result Value   Glucose, Bld 102 (*)    BUN 31 (*)    Creatinine, Ser 1.18 (*)    Calcium 8.8 (*)    GFR calc non Af  Amer 41 (*)    GFR calc Af Amer 48 (*)    All other components within normal limits  CBC - Abnormal; Notable for the following components:   Hemoglobin 11.7 (*)    All other components within normal limits  HEPATIC FUNCTION PANEL  LIPASE, BLOOD  TROPONIN I (HIGH SENSITIVITY)  TROPONIN I (HIGH SENSITIVITY)   ____________________________________________  EKG  ED ECG REPORT I, Chinaza Rooke J, the attending physician, personally viewed and interpreted this ECG.   Date: 05/19/2019  EKG Time: 2252  Rate: 53  Rhythm: sinus bradycardia  Axis: Normal  Intervals:none  ST&T Change: Nonspecific  ____________________________________________  RADIOLOGY  ED MD interpretation: No acute cardiopulmonary process  Official radiology report(s): DG Chest 2 View  Result Date: 05/18/2019 CLINICAL DATA:  Burning sensation, gastroesophageal reflux, chest pain EXAM: CHEST - 2 VIEW COMPARISON:  05/26/2017 FINDINGS: Frontal and lateral views of the chest demonstrate a stable cardiac silhouette. Chronic background scarring without airspace disease, effusion, or pneumothorax. No acute bony abnormality. IMPRESSION: 1. No acute intrathoracic process. Electronically Signed   By: Randa Ngo M.D.   On: 05/18/2019 23:14    ____________________________________________   PROCEDURES  Procedure(s) performed (including Critical Care):  Procedures   ____________________________________________   INITIAL IMPRESSION / ASSESSMENT AND PLAN / ED COURSE  As part of my medical decision making, I reviewed the following data within the [place] notes reviewed and incorporated, Labs reviewed, EKG interpreted, Old chart reviewed, Radiograph reviewed and Notes from prior ED visits     Amanda Hogan was evaluated in Emergency Department on 05/19/2019 for the symptoms described in the history of present illness. She was evaluated in the context of the global COVID-19 pandemic, which  necessitated consideration that the patient might be at risk for infection with the SARS-CoV-2 virus that causes COVID-19. Institutional protocols and algorithms that pertain to the evaluation of patients at risk for COVID-19 are in a state of rapid change based on information released by regulatory bodies including the CDC and federal  and state organizations. These policies and algorithms were followed during the patient's care in the ED.    86 year old female who presents with throat burning. Differential diagnosis includes, but is not limited to, ACS, aortic dissection, pulmonary embolism, cardiac tamponade, pneumothorax, pneumonia, pericarditis, myocarditis, GI-related causes including esophagitis/gastritis, and musculoskeletal chest wall pain.    Patient resting in no acute distress; voices no complaints of throat burning or chest pain.  Initial troponin unremarkable.  Will repeat timed troponin, check LFTs and lipase.  Clinical Course as of May 18 298  Sun May 19, 2019  0258 Repeat troponin, LFTs and lipase unremarkable.  Patient sleeping in no acute distress.  Strict return precautions given.  Patient verbalizes understanding and agrees with plan of care.   [JS]    Clinical Course User Index [JS] Paulette Blanch, MD     ____________________________________________   FINAL CLINICAL IMPRESSION(S) / ED DIAGNOSES  Final diagnoses:  Gastroesophageal reflux disease, unspecified whether esophagitis present     ED Discharge Orders    None       Note:  This document was prepared using Dragon voice recognition software and may include unintentional dictation errors.   Paulette Blanch, MD 05/19/19 [phone]

## 2019-05-19 NOTE — Discharge Instructions (Addendum)
Take your medicines as directed by your doctor.  Return to the ER for worsening symptoms, persistent vomiting, difficulty breathing or other concerns. 

## 2019-05-19 NOTE — ED Notes (Signed)
Pt unable to sign signature pad; discharge instructions reviewed with pt's daughter.

## 2019-05-19 NOTE — ED Notes (Signed)
Pt refusing to go with EMS to be transported back to [facility]. Per EMS pt is oriented and answering questions appropriately and by this standard she is of her right mind despite having "memory problems" per her family they legally cannot transport her against her will. Pt's son was called with no answer. Pt's daughter Onalee Hua was contacted and attempted to convince pt to go with EMS transport but pt again refused. ED Charge RN notified.   Onalee Hua stated to this RN that she would call me back with a plan of action for pts discharge

## 2019-05-19 NOTE — ED Notes (Signed)
Pt's daughter Onalee Hua called this RN and state that she will be coming here to pick up her mother in about 35-40 minutes.

## 2019-05-19 NOTE — ED Notes (Addendum)
Pt is alert and oriented to person and situation (believes she is at [facility] and it is Feb 2002).  She was found to be sleeping when this RN entered the room, and she reported initially she was not feeling any pain then stated her head hurt a little bit.  Pt is not expressing any other complaints or needs at this time.

## 2019-05-20 DIAGNOSIS — Z8673 Personal history of transient ischemic attack (TIA), and cerebral infarction without residual deficits: Secondary | ICD-10-CM | POA: Diagnosis not present

## 2019-05-20 DIAGNOSIS — F0151 Vascular dementia with behavioral disturbance: Secondary | ICD-10-CM | POA: Diagnosis not present

## 2019-05-20 DIAGNOSIS — I4891 Unspecified atrial fibrillation: Secondary | ICD-10-CM | POA: Diagnosis not present

## 2019-05-20 DIAGNOSIS — I2510 Atherosclerotic heart disease of native coronary artery without angina pectoris: Secondary | ICD-10-CM | POA: Diagnosis not present

## 2019-05-20 DIAGNOSIS — I10 Essential (primary) hypertension: Secondary | ICD-10-CM | POA: Diagnosis not present

## 2019-05-20 DIAGNOSIS — F0281 Dementia in other diseases classified elsewhere with behavioral disturbance: Secondary | ICD-10-CM | POA: Diagnosis not present

## 2019-05-20 DIAGNOSIS — N189 Chronic kidney disease, unspecified: Secondary | ICD-10-CM | POA: Diagnosis not present

## 2019-05-20 DIAGNOSIS — E785 Hyperlipidemia, unspecified: Secondary | ICD-10-CM | POA: Diagnosis not present

## 2019-05-20 DIAGNOSIS — I272 Pulmonary hypertension, unspecified: Secondary | ICD-10-CM | POA: Diagnosis not present

## 2019-05-20 DIAGNOSIS — Z7982 Long term (current) use of aspirin: Secondary | ICD-10-CM | POA: Diagnosis not present

## 2019-05-20 DIAGNOSIS — Z6841 Body Mass Index (BMI) 40.0 and over, adult: Secondary | ICD-10-CM | POA: Diagnosis not present

## 2019-05-20 DIAGNOSIS — D631 Anemia in chronic kidney disease: Secondary | ICD-10-CM | POA: Diagnosis not present

## 2019-05-20 DIAGNOSIS — Z8571 Personal history of Hodgkin lymphoma: Secondary | ICD-10-CM | POA: Diagnosis not present

## 2019-05-20 DIAGNOSIS — M799 Soft tissue disorder, unspecified: Secondary | ICD-10-CM | POA: Diagnosis not present

## 2019-05-20 DIAGNOSIS — I130 Hypertensive heart and chronic kidney disease with heart failure and stage 1 through stage 4 chronic kidney disease, or unspecified chronic kidney disease: Secondary | ICD-10-CM | POA: Diagnosis not present

## 2019-05-20 DIAGNOSIS — I779 Disorder of arteries and arterioles, unspecified: Secondary | ICD-10-CM | POA: Diagnosis not present

## 2019-05-20 DIAGNOSIS — G2581 Restless legs syndrome: Secondary | ICD-10-CM | POA: Diagnosis not present

## 2019-05-20 DIAGNOSIS — E538 Deficiency of other specified B group vitamins: Secondary | ICD-10-CM | POA: Diagnosis not present

## 2019-05-20 DIAGNOSIS — K219 Gastro-esophageal reflux disease without esophagitis: Secondary | ICD-10-CM | POA: Diagnosis not present

## 2019-05-20 DIAGNOSIS — R269 Unspecified abnormalities of gait and mobility: Secondary | ICD-10-CM | POA: Diagnosis not present

## 2019-05-20 DIAGNOSIS — I5031 Acute diastolic (congestive) heart failure: Secondary | ICD-10-CM | POA: Diagnosis not present

## 2019-05-20 DIAGNOSIS — Z20828 Contact with and (suspected) exposure to other viral communicable diseases: Secondary | ICD-10-CM | POA: Diagnosis not present

## 2019-05-20 DIAGNOSIS — M549 Dorsalgia, unspecified: Secondary | ICD-10-CM | POA: Diagnosis not present

## 2019-05-20 DIAGNOSIS — Z7902 Long term (current) use of antithrombotics/antiplatelets: Secondary | ICD-10-CM | POA: Diagnosis not present

## 2019-05-20 DIAGNOSIS — M1990 Unspecified osteoarthritis, unspecified site: Secondary | ICD-10-CM | POA: Diagnosis not present

## 2019-05-20 DIAGNOSIS — Z9114 Patient's other noncompliance with medication regimen: Secondary | ICD-10-CM | POA: Diagnosis not present

## 2019-05-20 DIAGNOSIS — E669 Obesity, unspecified: Secondary | ICD-10-CM | POA: Diagnosis not present

## 2019-05-20 DIAGNOSIS — G8929 Other chronic pain: Secondary | ICD-10-CM | POA: Diagnosis not present

## 2019-05-20 DIAGNOSIS — F419 Anxiety disorder, unspecified: Secondary | ICD-10-CM | POA: Diagnosis not present

## 2019-05-27 DIAGNOSIS — Z20828 Contact with and (suspected) exposure to other viral communicable diseases: Secondary | ICD-10-CM | POA: Diagnosis not present

## 2019-05-30 DIAGNOSIS — I779 Disorder of arteries and arterioles, unspecified: Secondary | ICD-10-CM | POA: Diagnosis not present

## 2019-05-30 DIAGNOSIS — G2581 Restless legs syndrome: Secondary | ICD-10-CM | POA: Diagnosis not present

## 2019-05-30 DIAGNOSIS — Z8571 Personal history of Hodgkin lymphoma: Secondary | ICD-10-CM | POA: Diagnosis not present

## 2019-05-30 DIAGNOSIS — N189 Chronic kidney disease, unspecified: Secondary | ICD-10-CM | POA: Diagnosis not present

## 2019-05-30 DIAGNOSIS — E538 Deficiency of other specified B group vitamins: Secondary | ICD-10-CM | POA: Diagnosis not present

## 2019-05-30 DIAGNOSIS — K219 Gastro-esophageal reflux disease without esophagitis: Secondary | ICD-10-CM | POA: Diagnosis not present

## 2019-05-30 DIAGNOSIS — Z7982 Long term (current) use of aspirin: Secondary | ICD-10-CM | POA: Diagnosis not present

## 2019-05-30 DIAGNOSIS — I130 Hypertensive heart and chronic kidney disease with heart failure and stage 1 through stage 4 chronic kidney disease, or unspecified chronic kidney disease: Secondary | ICD-10-CM | POA: Diagnosis not present

## 2019-05-30 DIAGNOSIS — I4891 Unspecified atrial fibrillation: Secondary | ICD-10-CM | POA: Diagnosis not present

## 2019-05-30 DIAGNOSIS — D631 Anemia in chronic kidney disease: Secondary | ICD-10-CM | POA: Diagnosis not present

## 2019-05-30 DIAGNOSIS — Z7902 Long term (current) use of antithrombotics/antiplatelets: Secondary | ICD-10-CM | POA: Diagnosis not present

## 2019-05-30 DIAGNOSIS — E785 Hyperlipidemia, unspecified: Secondary | ICD-10-CM | POA: Diagnosis not present

## 2019-05-30 DIAGNOSIS — G8929 Other chronic pain: Secondary | ICD-10-CM | POA: Diagnosis not present

## 2019-05-30 DIAGNOSIS — F0281 Dementia in other diseases classified elsewhere with behavioral disturbance: Secondary | ICD-10-CM | POA: Diagnosis not present

## 2019-05-30 DIAGNOSIS — M799 Soft tissue disorder, unspecified: Secondary | ICD-10-CM | POA: Diagnosis not present

## 2019-05-30 DIAGNOSIS — E669 Obesity, unspecified: Secondary | ICD-10-CM | POA: Diagnosis not present

## 2019-05-30 DIAGNOSIS — M549 Dorsalgia, unspecified: Secondary | ICD-10-CM | POA: Diagnosis not present

## 2019-05-30 DIAGNOSIS — Z8673 Personal history of transient ischemic attack (TIA), and cerebral infarction without residual deficits: Secondary | ICD-10-CM | POA: Diagnosis not present

## 2019-05-30 DIAGNOSIS — F0151 Vascular dementia with behavioral disturbance: Secondary | ICD-10-CM | POA: Diagnosis not present

## 2019-05-30 DIAGNOSIS — F419 Anxiety disorder, unspecified: Secondary | ICD-10-CM | POA: Diagnosis not present

## 2019-05-30 DIAGNOSIS — Z6841 Body Mass Index (BMI) 40.0 and over, adult: Secondary | ICD-10-CM | POA: Diagnosis not present

## 2019-05-30 DIAGNOSIS — I272 Pulmonary hypertension, unspecified: Secondary | ICD-10-CM | POA: Diagnosis not present

## 2019-05-30 DIAGNOSIS — I5031 Acute diastolic (congestive) heart failure: Secondary | ICD-10-CM | POA: Diagnosis not present

## 2019-05-30 DIAGNOSIS — M1990 Unspecified osteoarthritis, unspecified site: Secondary | ICD-10-CM | POA: Diagnosis not present

## 2019-06-03 DIAGNOSIS — Z20828 Contact with and (suspected) exposure to other viral communicable diseases: Secondary | ICD-10-CM | POA: Diagnosis not present

## 2019-06-10 DIAGNOSIS — G8929 Other chronic pain: Secondary | ICD-10-CM | POA: Diagnosis not present

## 2019-06-10 DIAGNOSIS — I779 Disorder of arteries and arterioles, unspecified: Secondary | ICD-10-CM | POA: Diagnosis not present

## 2019-06-10 DIAGNOSIS — G2581 Restless legs syndrome: Secondary | ICD-10-CM | POA: Diagnosis not present

## 2019-06-10 DIAGNOSIS — F419 Anxiety disorder, unspecified: Secondary | ICD-10-CM | POA: Diagnosis not present

## 2019-06-10 DIAGNOSIS — N189 Chronic kidney disease, unspecified: Secondary | ICD-10-CM | POA: Diagnosis not present

## 2019-06-10 DIAGNOSIS — D631 Anemia in chronic kidney disease: Secondary | ICD-10-CM | POA: Diagnosis not present

## 2019-06-10 DIAGNOSIS — M549 Dorsalgia, unspecified: Secondary | ICD-10-CM | POA: Diagnosis not present

## 2019-06-10 DIAGNOSIS — Z7902 Long term (current) use of antithrombotics/antiplatelets: Secondary | ICD-10-CM | POA: Diagnosis not present

## 2019-06-10 DIAGNOSIS — I4891 Unspecified atrial fibrillation: Secondary | ICD-10-CM | POA: Diagnosis not present

## 2019-06-10 DIAGNOSIS — Z8673 Personal history of transient ischemic attack (TIA), and cerebral infarction without residual deficits: Secondary | ICD-10-CM | POA: Diagnosis not present

## 2019-06-10 DIAGNOSIS — I130 Hypertensive heart and chronic kidney disease with heart failure and stage 1 through stage 4 chronic kidney disease, or unspecified chronic kidney disease: Secondary | ICD-10-CM | POA: Diagnosis not present

## 2019-06-10 DIAGNOSIS — Z6841 Body Mass Index (BMI) 40.0 and over, adult: Secondary | ICD-10-CM | POA: Diagnosis not present

## 2019-06-10 DIAGNOSIS — Z8571 Personal history of Hodgkin lymphoma: Secondary | ICD-10-CM | POA: Diagnosis not present

## 2019-06-10 DIAGNOSIS — M799 Soft tissue disorder, unspecified: Secondary | ICD-10-CM | POA: Diagnosis not present

## 2019-06-10 DIAGNOSIS — E669 Obesity, unspecified: Secondary | ICD-10-CM | POA: Diagnosis not present

## 2019-06-10 DIAGNOSIS — Z7982 Long term (current) use of aspirin: Secondary | ICD-10-CM | POA: Diagnosis not present

## 2019-06-10 DIAGNOSIS — Z9181 History of falling: Secondary | ICD-10-CM | POA: Diagnosis not present

## 2019-06-10 DIAGNOSIS — E785 Hyperlipidemia, unspecified: Secondary | ICD-10-CM | POA: Diagnosis not present

## 2019-06-10 DIAGNOSIS — F0151 Vascular dementia with behavioral disturbance: Secondary | ICD-10-CM | POA: Diagnosis not present

## 2019-06-10 DIAGNOSIS — K219 Gastro-esophageal reflux disease without esophagitis: Secondary | ICD-10-CM | POA: Diagnosis not present

## 2019-06-10 DIAGNOSIS — I272 Pulmonary hypertension, unspecified: Secondary | ICD-10-CM | POA: Diagnosis not present

## 2019-06-10 DIAGNOSIS — D510 Vitamin B12 deficiency anemia due to intrinsic factor deficiency: Secondary | ICD-10-CM | POA: Diagnosis not present

## 2019-06-10 DIAGNOSIS — M1990 Unspecified osteoarthritis, unspecified site: Secondary | ICD-10-CM | POA: Diagnosis not present

## 2019-06-10 DIAGNOSIS — I5031 Acute diastolic (congestive) heart failure: Secondary | ICD-10-CM | POA: Diagnosis not present

## 2019-06-11 DIAGNOSIS — I739 Peripheral vascular disease, unspecified: Secondary | ICD-10-CM | POA: Diagnosis not present

## 2019-06-11 DIAGNOSIS — B351 Tinea unguium: Secondary | ICD-10-CM | POA: Diagnosis not present

## 2019-06-11 DIAGNOSIS — L84 Corns and callosities: Secondary | ICD-10-CM | POA: Diagnosis not present

## 2019-06-11 DIAGNOSIS — R269 Unspecified abnormalities of gait and mobility: Secondary | ICD-10-CM | POA: Diagnosis not present

## 2019-06-13 DIAGNOSIS — I10 Essential (primary) hypertension: Secondary | ICD-10-CM | POA: Diagnosis not present

## 2019-06-13 DIAGNOSIS — Z79899 Other long term (current) drug therapy: Secondary | ICD-10-CM | POA: Diagnosis not present

## 2019-06-13 DIAGNOSIS — E782 Mixed hyperlipidemia: Secondary | ICD-10-CM | POA: Diagnosis not present

## 2019-06-13 DIAGNOSIS — G8929 Other chronic pain: Secondary | ICD-10-CM | POA: Diagnosis not present

## 2019-06-19 ENCOUNTER — Other Ambulatory Visit: Payer: Self-pay

## 2019-06-19 ENCOUNTER — Non-Acute Institutional Stay: Payer: PPO | Admitting: Adult Health Nurse Practitioner

## 2019-06-19 DIAGNOSIS — F0390 Unspecified dementia without behavioral disturbance: Secondary | ICD-10-CM

## 2019-06-19 DIAGNOSIS — Z515 Encounter for palliative care: Secondary | ICD-10-CM | POA: Diagnosis not present

## 2019-06-19 NOTE — Progress Notes (Signed)
[facility] Consult Note Telephone: [phone]  Fax: [phone]  PATIENT NAME: Amanda Hogan DOB: 1928-07-28 MRN: [id number]  PRIMARY CARE PROVIDER:   Raymondo Band, MD  REFERRING PROVIDER:  Raymondo Band, MD [address] [place],  [place] [address]  RESPONSIBLE PARTY:   [name] [phone]; [name] daughter [phone]    RECOMMENDATIONS and PLAN:  1.  Advanced care planning.  Patient is a DNR.  Left VM with [name] to give update on her mother.  Left contact info to call with any questions.  2.  Dementia.  FAST 6d.  Patient is able to ambulate with walker.  She requires some assistance with ADLs and is incontinent of B&B.  She is able to feed herself. Patient's current weight is 181.5.  No reports of falls, infection, weight loss by staff.  Continue supportive care.  Overall patient is stable.  No concerns reported by staff.  She was seen at ER on 05/18/19 due to her throat burning.  She had not taken her reflux medications.  No acute findings upon evaluation.  Palliative will continue to monitor for symptom management/decline and make recommendations as needed.  Will follow up in 4-6 weeks  I spent 30 minutes providing this consultation,  from 10:40 to 11:10 including time with patient/family, chart review, provider coordination, and documentation. More than 50% of the time in this consultation was spent coordinating communication.   HISTORY OF PRESENT ILLNESS:  Amanda Hogan is a 86 y.o. year old female with multiple medical problems including including dementia, HTN, OA, CAD, fibromyalgia. Palliative Care was asked to help address goals of care.   CODE STATUS: DNR  PPS: 40% HOSPICE ELIGIBILITY/DIAGNOSIS: TBD  PHYSICAL EXAM:   General:patient lying in bed in NAD Cardiovascular: regular rate and rhythm Pulmonary: lung sounds clear; normal respiratory effort Abdomen: soft, nontender,  + bowel sounds GU: no suprapubic tenderness Extremities: no edema, no joint deformities Skin: no rashes on exposed skin Neurological: Weakness; A&O to self  PAST MEDICAL HISTORY:  Past Medical History:  Diagnosis Date  . Acid reflux   . Angina   . Anxiety 02/23/2012  . Arthritis   . Chest pain    negative lexiscan myoview 53/13, last echo 02/24/12-EF [id number], mid systolic obliteration of the LV, cavity aortic sclerosis  . Complication of anesthesia    "w/my back; gave me too much; thought I'd had stroke but didn't"  . Dementia ([place]) 02/23/2012  . Exertional dyspnea   . Fibromyalgia   . Heart murmur   . High cholesterol   . HTN (hypertension)   . Hypertension   . Kidney stone   . Memory disorder, possibly not taking home meds at times 07/22/2011  . Mild carotid artery disease ([place]) 08/05/11   carotid dopplers, mild disease  . Restless leg syndrome   . Sinus malignant neoplasm ([place])   . Stroke [facility])    "said it looked like bullet holes; had a bunch"  . TIA (transient ischemic attack)     SOCIAL HX:  Social History   Tobacco Use  . Smoking status: Never Smoker  . Smokeless tobacco: Never Used  Substance Use Topics  . Alcohol use: No    ALLERGIES:  Allergies  Allergen Reactions  . Iohexol Shortness Of Breath and Itching  . Codeine Sulfate Other (See Comments)    Unknown reaction.  . Contrast Media [Iodinated Diagnostic Agents] Other (See Comments)  Reaction:  Unknown      PERTINENT MEDICATIONS:  Outpatient Encounter Medications as of 06/19/2019  Medication Sig  . acetaminophen (TYLENOL) 500 MG tablet Take 1,000 mg by mouth every 8 (eight) hours.   Marland Kitchen amLODipine (NORVASC) 2.5 MG tablet Take 2.5 mg by mouth daily.  Marland Kitchen aspirin (ASPIRIN CHILDRENS) 81 MG chewable tablet Chew 81 mg by mouth every morning.  Marland Kitchen atorvastatin (LIPITOR) 40 MG tablet Take 1 tablet by mouth at bedtime.   . calcium-vitamin D (OSCAL WITH D) 500-200 MG-UNIT tablet Take 1 tablet by mouth 3 (three)  times daily before meals.  . clopidogrel (PLAVIX) 75 MG tablet Take 75 mg by mouth daily.  Marland Kitchen escitalopram (LEXAPRO) 5 MG tablet Take 5 mg by mouth at bedtime.   . furosemide (LASIX) 20 MG tablet Take 30 mg by mouth daily.   Marland Kitchen gabapentin (NEURONTIN) 300 MG capsule Take 300 mg by mouth 3 (three) times daily.  Marland Kitchen LORazepam (ATIVAN) 0.5 MG tablet Take 0.5 mg by mouth 2 (two) times daily as needed for anxiety.  . memantine (NAMENDA) 5 MG tablet Take 5 mg by mouth daily.   . metoprolol succinate (TOPROL-XL) 100 MG 24 hr tablet Take 100 mg by mouth daily. Take with or immediately following a meal.  . ondansetron (ZOFRAN) 4 MG tablet Take 4 mg by mouth every 6 (six) hours as needed for nausea or vomiting.  Marland Kitchen QUEtiapine (SEROQUEL) 25 MG tablet Take 12.5 mg by mouth 2 (two) times daily as needed ((agitation)).   Marland Kitchen ranitidine (ZANTAC) 150 MG tablet Take 150 mg by mouth 2 (two) times daily.   Marland Kitchen rOPINIRole (REQUIP) 1 MG tablet Take 1 mg by mouth 3 (three) times daily.   Marland Kitchen spironolactone (ALDACTONE) 25 MG tablet Take 25 mg by mouth daily.   No facility-administered encounter medications on file as of 06/19/2019.     Hailyn Zarr Jenetta Downer, NP

## 2019-06-22 ENCOUNTER — Other Ambulatory Visit: Payer: Self-pay

## 2019-06-22 ENCOUNTER — Emergency Department
Admission: EM | Admit: 2019-06-22 | Discharge: 2019-06-22 | Disposition: A | Discharge status: Home / Self Care | Payer: PPO | Attending: Emergency Medicine | Admitting: Emergency Medicine

## 2019-06-22 ENCOUNTER — Emergency Department: Payer: PPO

## 2019-06-22 DIAGNOSIS — R279 Unspecified lack of coordination: Secondary | ICD-10-CM | POA: Diagnosis not present

## 2019-06-22 DIAGNOSIS — Y9301 Activity, walking, marching and hiking: Secondary | ICD-10-CM | POA: Insufficient documentation

## 2019-06-22 DIAGNOSIS — F0390 Unspecified dementia without behavioral disturbance: Secondary | ICD-10-CM | POA: Insufficient documentation

## 2019-06-22 DIAGNOSIS — S0990XA Unspecified injury of head, initial encounter: Secondary | ICD-10-CM | POA: Insufficient documentation

## 2019-06-22 DIAGNOSIS — Y999 Unspecified external cause status: Secondary | ICD-10-CM | POA: Diagnosis not present

## 2019-06-22 DIAGNOSIS — W010XXA Fall on same level from slipping, tripping and stumbling without subsequent striking against object, initial encounter: Secondary | ICD-10-CM | POA: Insufficient documentation

## 2019-06-22 DIAGNOSIS — R52 Pain, unspecified: Secondary | ICD-10-CM | POA: Diagnosis not present

## 2019-06-22 DIAGNOSIS — Z743 Need for continuous supervision: Secondary | ICD-10-CM | POA: Diagnosis not present

## 2019-06-22 DIAGNOSIS — S0003XA Contusion of scalp, initial encounter: Secondary | ICD-10-CM | POA: Diagnosis not present

## 2019-06-22 DIAGNOSIS — R410 Disorientation, unspecified: Secondary | ICD-10-CM | POA: Diagnosis not present

## 2019-06-22 DIAGNOSIS — W19XXXA Unspecified fall, initial encounter: Secondary | ICD-10-CM

## 2019-06-22 DIAGNOSIS — Z79899 Other long term (current) drug therapy: Secondary | ICD-10-CM | POA: Diagnosis not present

## 2019-06-22 DIAGNOSIS — Y92129 Unspecified place in nursing home as the place of occurrence of the external cause: Secondary | ICD-10-CM | POA: Insufficient documentation

## 2019-06-22 DIAGNOSIS — I10 Essential (primary) hypertension: Secondary | ICD-10-CM | POA: Diagnosis not present

## 2019-06-22 DIAGNOSIS — R404 Transient alteration of awareness: Secondary | ICD-10-CM | POA: Diagnosis not present

## 2019-06-22 DIAGNOSIS — R0902 Hypoxemia: Secondary | ICD-10-CM | POA: Diagnosis not present

## 2019-06-22 DIAGNOSIS — M542 Cervicalgia: Secondary | ICD-10-CM | POA: Diagnosis not present

## 2019-06-22 LAB — CBC WITH DIFFERENTIAL/PLATELET
Abs Immature Granulocytes: 0.02 10*3/uL (ref 0.00–0.07)
Basophils Absolute: 0 10*3/uL (ref 0.0–0.1)
Basophils Relative: 1 %
Eosinophils Absolute: 0.2 10*3/uL (ref 0.0–0.5)
Eosinophils Relative: 3 %
HCT: 38.6 % (ref 36.0–46.0)
Hemoglobin: 12.7 g/dL (ref 12.0–15.0)
Immature Granulocytes: 0 %
Lymphocytes Relative: 23 %
Lymphs Abs: 1.9 10*3/uL (ref 0.7–4.0)
MCH: 31.4 pg (ref 26.0–34.0)
MCHC: 32.9 g/dL (ref 30.0–36.0)
MCV: 95.3 fL (ref 80.0–100.0)
Monocytes Absolute: 0.6 10*3/uL (ref 0.1–1.0)
Monocytes Relative: 7 %
Neutro Abs: 5.4 10*3/uL (ref 1.7–7.7)
Neutrophils Relative %: 66 %
Platelets: 234 10*3/uL (ref 150–400)
RBC: 4.05 MIL/uL (ref 3.87–5.11)
RDW: 13.1 % (ref 11.5–15.5)
WBC: 8.2 10*3/uL (ref 4.0–10.5)
nRBC: 0 % (ref 0.0–0.2)

## 2019-06-22 LAB — URINALYSIS, COMPLETE (UACMP) WITH MICROSCOPIC
Bilirubin Urine: NEGATIVE
Glucose, UA: NEGATIVE mg/dL
Hgb urine dipstick: NEGATIVE
Ketones, ur: NEGATIVE mg/dL
Leukocytes,Ua: NEGATIVE
Nitrite: NEGATIVE
Protein, ur: NEGATIVE mg/dL
Specific Gravity, Urine: 1.016 (ref 1.005–1.030)
pH: 7 (ref 5.0–8.0)

## 2019-06-22 LAB — COMPREHENSIVE METABOLIC PANEL
ALT: 13 U/L (ref 0–44)
AST: 16 U/L (ref 15–41)
Albumin: 4.4 g/dL (ref 3.5–5.0)
Alkaline Phosphatase: 65 U/L (ref 38–126)
Anion gap: 10 (ref 5–15)
BUN: 47 mg/dL — ABNORMAL HIGH (ref 8–23)
CO2: 28 mmol/L (ref 22–32)
Calcium: 9.6 mg/dL (ref 8.9–10.3)
Chloride: 101 mmol/L (ref 98–111)
Creatinine, Ser: 1.37 mg/dL — ABNORMAL HIGH (ref 0.44–1.00)
GFR calc Af Amer: 40 mL/min — ABNORMAL LOW (ref >60)
GFR calc non Af Amer: 34 mL/min — ABNORMAL LOW (ref >60)
Glucose, Bld: 101 mg/dL — ABNORMAL HIGH (ref 70–99)
Potassium: 4.8 mmol/L (ref 3.5–5.1)
Sodium: 139 mmol/L (ref 135–145)
Total Bilirubin: 1 mg/dL (ref 0.3–1.2)
Total Protein: 8.2 g/dL — ABNORMAL HIGH (ref 6.5–8.1)

## 2019-06-22 LAB — TROPONIN I (HIGH SENSITIVITY): Troponin I (High Sensitivity): 5 ng/L (ref <18)

## 2019-06-22 MED ORDER — ACETAMINOPHEN 500 MG PO TABS
1000.0000 mg | ORAL_TABLET | Freq: Once | ORAL | Status: AC
  Administered 2019-06-22: 1000 mg via ORAL
  Filled 2019-06-22: qty 2

## 2019-06-22 MED ORDER — SODIUM CHLORIDE 0.9 % IV BOLUS
500.0000 mL | Freq: Once | INTRAVENOUS | Status: AC
  Administered 2019-06-22: 500 mL via INTRAVENOUS

## 2019-06-22 NOTE — ED Notes (Signed)
Pt transported to CT ?

## 2019-06-22 NOTE — ED Notes (Signed)
Left message on cell phone for daughter [name] to update her on pt. Tried home phone but Ms. Powell not answering.

## 2019-06-22 NOTE — ED Notes (Signed)
Pt walked to toilet with walker and stand by assist . Pt with slow but steady gait.

## 2019-06-22 NOTE — ED Triage Notes (Signed)
Pt arrives via EMS from [place] after she had a fall- per EMS pt was dizzy and fell hitting the back of her head on the left side- pt does not take blood thinners- pt has a hx of dementia

## 2019-06-22 NOTE — ED Notes (Signed)
Pt ambulated in room, pt slow but steady with walker and stand by assist. Pt states she feels slightly dizzy, but dizziness lessened as she walked. Pt taken to toilet. Pt given new brief and paper scrub pants, bedding changed.

## 2019-06-22 NOTE — ED Notes (Signed)
Called [facility] for transport of patient to [facility]  [phone]

## 2019-06-22 NOTE — ED Provider Notes (Signed)
[facility] Emergency Department Provider Note  ____________________________________________   None    (approximate)  I have reviewed the triage vital signs and the nursing notes.   HISTORY  Chief Complaint Fall    HPI Amanda Hogan is a 86 y.o. female with dementia who comes in from [place] for concern of a fall.  Patient ambulates with a walker and was going down the hallway or when she had a fall and fell backwards and hit the back of her head.  Patient is endorsing some pain in the back of her head, mild, constant has not taking anything for it, nothing makes it worse.  Patient has not taken her morning medications yet.  Report from EMS was that patient felt a little dizzy but denied any chest pain or shortness of breath.  Patient states that she was leaning over trying to reach something and did feel little dizzy and then fell over.  Denies any chest pain, abdominal pain, fevers or any other concerns   Past Medical History:  Diagnosis Date  . Acid reflux   . Angina   . Anxiety 02/23/2012  . Arthritis   . Chest pain    negative lexiscan myoview 53/13, last echo 02/24/12-EF [id number], mid systolic obliteration of the LV, cavity aortic sclerosis  . Complication of anesthesia    "w/my back; gave me too much; thought I'd had stroke but didn't"  . Dementia ([place]) 02/23/2012  . Exertional dyspnea   . Fibromyalgia   . Heart murmur   . High cholesterol   . HTN (hypertension)   . Hypertension   . Kidney stone   . Memory disorder, possibly not taking home meds at times 07/22/2011  . Mild carotid artery disease ([place]) 08/05/11   carotid dopplers, mild disease  . Restless leg syndrome   . Sinus malignant neoplasm ([place])   . Stroke [facility])    "said it looked like bullet holes; had a bunch"  . TIA (transient ischemic attack)     Patient Active Problem List   Diagnosis Date Noted  . Acute kidney injury ([place]) 12/27/2014  . Acute CVA (cerebrovascular  accident) ([place]) 12/16/2014  . Diastolic dysfunction-grade 2 10/29/2014  . Pulmonary hypertension-moderate 10/29/2014  . Troponin level elevated-suspect demand ischemia from AF with RVR 10/28/2014  . Lymphoma history 10/28/2014  . Acute diastolic CHF (congestive heart failure) ([place]) 10/27/2014  . Atrial fibrillation with RVR (new)   . TIA (transient ischemic attack) 02/25/2012  . Encephalopathy acute 02/23/2012  . Anemia 02/23/2012  . Chronic back pain 02/23/2012  . Anxiety 02/23/2012  . Mild carotid artery disease ([place]) 08/05/2011  . Memory disorder, possibly not taking home meds at times 07/22/2011  . Hypertension, at times poorly controlled 07/21/2011  . Chest pain-low risk Myoview May 2013 07/21/2011  . Hyperlipidemia 07/21/2011  . Gastroesophageal reflux disease 07/21/2011  . History of TIA (transient ischemic attack) 07/21/2011  . Obesity 07/21/2011    Past Surgical History:  Procedure Laterality Date  . BREAST CYST EXCISION     left  . CARPAL TUNNEL RELEASE     right  . CATARACT EXTRACTION W/ INTRAOCULAR LENS  IMPLANT, BILATERAL    . CHOLECYSTECTOMY  2005  . ELBOW BURSA SURGERY     left  . EYE SURGERY  1940   fixed my crossed eyes"  . HERNIA REPAIR  2007   "stomach"  . KIDNEY STONE SURGERY     "right side; cut it out"  . KNEE  CARTILAGE SURGERY     right  . LUMBAR SPINE SURGERY     "put box in my lower back"  . LUNG SURGERY     "made 3 holes and pulled gel out"  . VAGINAL HYSTERECTOMY      Prior to Admission medications   Medication Sig Start Date End Date Taking? Authorizing Provider  acetaminophen (TYLENOL) 500 MG tablet Take 1,000 mg by mouth every 8 (eight) hours.     [provider]  amLODipine (NORVASC) 2.5 MG tablet Take 2.5 mg by mouth daily.    [provider]  aspirin (ASPIRIN CHILDRENS) 81 MG chewable tablet Chew 81 mg by mouth every morning.    [provider]  atorvastatin (LIPITOR) 40 MG tablet Take 1 tablet by mouth at  bedtime.  04/25/15   [provider]  calcium-vitamin D (OSCAL WITH D) 500-200 MG-UNIT tablet Take 1 tablet by mouth 3 (three) times daily before meals.    [provider]  clopidogrel (PLAVIX) 75 MG tablet Take 75 mg by mouth daily.    [provider]  escitalopram (LEXAPRO) 5 MG tablet Take 5 mg by mouth at bedtime.     [provider]  furosemide (LASIX) 20 MG tablet Take 30 mg by mouth daily.     [provider]  gabapentin (NEURONTIN) 300 MG capsule Take 300 mg by mouth 3 (three) times daily.    [provider]  LORazepam (ATIVAN) 0.5 MG tablet Take 0.5 mg by mouth 2 (two) times daily as needed for anxiety.    [provider]  memantine (NAMENDA) 5 MG tablet Take 5 mg by mouth daily.     [provider]  metoprolol succinate (TOPROL-XL) 100 MG 24 hr tablet Take 100 mg by mouth daily. Take with or immediately following a meal.    [provider]  ondansetron (ZOFRAN) 4 MG tablet Take 4 mg by mouth every 6 (six) hours as needed for nausea or vomiting.    [provider]  QUEtiapine (SEROQUEL) 25 MG tablet Take 12.5 mg by mouth 2 (two) times daily as needed ((agitation)).     [provider]  ranitidine (ZANTAC) 150 MG tablet Take 150 mg by mouth 2 (two) times daily.     [provider]  rOPINIRole (REQUIP) 1 MG tablet Take 1 mg by mouth 3 (three) times daily.     [provider]  spironolactone (ALDACTONE) 25 MG tablet Take 25 mg by mouth daily.    [provider]    Allergies Iohexol, Codeine sulfate, and Contrast media [iodinated diagnostic agents]  Family History  Problem Relation Age of Onset  . Diabetes Neg Hx     Social History Social History   Tobacco Use  . Smoking status: Never Smoker  . Smokeless tobacco: Never Used  Substance Use Topics  . Alcohol use: No  . Drug use: No      Review of Systems Constitutional: No fever/chills, fall Eyes: No  visual changes. ENT: No sore throat. Cardiovascular: Denies chest pain. Respiratory: Denies shortness of breath. Gastrointestinal: No abdominal pain.  No nausea, no vomiting.  No diarrhea.  No constipation. Genitourinary: Negative for dysuria. Musculoskeletal: Negative for back pain. Skin: Negative for rash. Neurological: Positive headache s, focal weakness or numbness. dizzy All other ROS negative ____________________________________________   PHYSICAL EXAM:  VITAL SIGNS: ED Triage Vitals  Enc Vitals Group     BP 06/22/19 0812 (!) 182/69     Pulse Rate 06/22/19  [id number] (!) 58     Resp 06/22/19 0812 18     Temp --      Temp src --      SpO2 --      Weight 06/22/19 0814 150 lb (68 kg)     Height 06/22/19 0814 5\' 6"  (1.676 m)     Head Circumference --      Peak Flow --      Pain Score 06/22/19 0813 0     Pain Loc --      Pain Edu? --      Excl. in [place]? --     Constitutional: Alert and oriented x2. GCS 15  Eyes: Conjunctivae are normal. EOMI. Head: Hematoma on the back of the head but no open abrasion. Nose: No congestion/rhinnorhea. Mouth/Throat: Mucous membranes are moist.   Neck: No stridor. Trachea Midline. FROM Cardiovascular: Normal rate, regular rhythm. Grossly normal heart sounds.  Good peripheral circulation. No chest wall tenderness Respiratory: Normal respiratory effort.  No retractions. Lungs CTAB. Gastrointestinal: Soft and nontender. No distention. No abdominal bruits.  Musculoskeletal:   RUE: No point tenderness, deformity or other signs of injury. Radial pulse intact. Neuro intact. Full ROM in joint. LUE: No point tenderness, deformity or other signs of injury. Radial pulse intact. Neuro intact. Full ROM in joints RLE: No point tenderness, deformity or other signs of injury. DP pulse intact. Neuro intact. Full ROM in joints.  Able to lift leg up off the bed LLE: No point tenderness, deformity or other signs of injury. DP pulse intact. Neuro intact. Full ROM in  joints.  Able to lift leg up off the bed Neurologic:  Normal speech and language. No gross focal neurologic deficits are appreciated.  Skin:  Skin is warm, dry and intact. No rash noted. Psychiatric: Mood and affect are normal. Speech and behavior are normal.  Baseline dementia. GU: Deferred   ____________________________________________   LABS (all labs ordered are listed, but only abnormal results are displayed)  Labs Reviewed  COMPREHENSIVE METABOLIC PANEL - Abnormal; Notable for the following components:      Result Value   Glucose, Bld 101 (*)    BUN 47 (*)    Creatinine, Ser 1.37 (*)    Total Protein 8.2 (*)    GFR calc non Af Amer 34 (*)    GFR calc Af Amer 40 (*)    All other components within normal limits  URINALYSIS, COMPLETE (UACMP) WITH MICROSCOPIC - Abnormal; Notable for the following components:   Color, Urine YELLOW (*)    APPearance HAZY (*)    Bacteria, UA MANY (*)    All other components within normal limits  CBC WITH DIFFERENTIAL/PLATELET  TROPONIN I (HIGH SENSITIVITY)  TROPONIN I (HIGH SENSITIVITY)   ____________________________________________   ED ECG REPORT I, Vanessa Sterling, the attending physician, personally viewed and interpreted this ECG.  EKG is sinus bradycardic rate of 53, no ST elevation, T wave inversion in lead III, normal intervals, occasional PVC ____________________________________________  RADIOLOGY   Official radiology report(s): CT Head Wo Contrast  Result Date: 06/22/2019 CLINICAL DATA:  Pain following fall.  Dementia. EXAM: CT HEAD WITHOUT CONTRAST CT CERVICAL SPINE WITHOUT CONTRAST TECHNIQUE: Multidetector CT imaging of the head and cervical spine was performed following the standard protocol without intravenous contrast. Multiplanar CT image reconstructions of the cervical spine were also generated. COMPARISON:  CT head and CT cervical spine July 10, 2016; head CT December 05, 2017 FINDINGS: CT HEAD FINDINGS Brain: Mild diffuse  atrophy is stable. There is no intracranial mass, hemorrhage, extra-axial fluid collection, or midline shift. Prior infarcts involving portions of the posterior right frontal and right temporal lobe regions are stable. Right infarct involves a portion of the right extreme capsule, stable. There is extensive small vessel disease in the centra semiovale bilaterally. There is evidence of a prior infarct at the genu of the left internal capsule. Small infarct also noted in the medial left lentiform nucleus. Small vessel disease is also noted throughout the internal capsules bilaterally. No acute appearing infarct is evident. Vascular: No hyperdense vessel. There is calcification in each carotid siphon and distal vertebral artery. Skull: The bony calvarium appears intact. There is a fairly small left parietal scalp hematoma. Sinuses/Orbits: There is mucosal thickening in multiple ethmoid air cells. Visualized orbits appear symmetric bilaterally. Other: Mastoid air cells are clear. CT CERVICAL SPINE FINDINGS Alignment: There is again noted 2 mm of anterolisthesis of C3 on C4. There is 1 mm of anterolisthesis of C4 on C5. There is 1 mm of anterolisthesis of C7 on T1, stable. No new spondylolisthesis evident. Skull base and vertebrae: Pannus posterior to the odontoid is again noted without significant impression on the craniocervical junction region. Skull base and craniocervical junction regions appear stable. Bones are osteoporotic. No acute fracture evident. No blastic or lytic bone lesions. Soft tissues and spinal canal: Prevertebral soft tissues and predental space regions are normal. No evident cord or canal hematoma. No paraspinous lesions are evident. Disc levels: Moderately severe disc space narrowing at C5-6 and C6-7 remains stable. There is multifocal facet arthropathy. Exit foraminal narrowing is again noted at C3-4 bilaterally, at C4-5 on the right, at C5-6 on the right, and C6-7 on the right. No disc extrusion  or high-grade stenosis evident. Upper chest: Visualized upper lung regions are clear. Other: There is are subcentimeter nodular opacities in the thyroid which do not warrant additional imaging surveillance based on consensus criteria. There are foci of carotid artery and left subclavian artery calcification. IMPRESSION: CT head: Stable atrophy with supratentorial small vessel disease. Prior infarcts involving portions of the right posterior frontal and right temporal lobes. Basal ganglia region infarcts noted. No acute infarct evident. No mass or hemorrhage. Foci of arterial vascular calcification noted. Mucosal thickening noted in several ethmoid air cells. No evident fracture.  Small left parietal scalp hematoma noted. CT cervical spine: 1. No fracture. Slight spondylosis at several levels is stable compared to prior study and consistent with underlying spondylosis. No new spondylolisthesis. Bones osteoporotic. 2. Multilevel arthropathy, similar to prior study. No disc extrusion or high-grade stenosis evident. 3. Scattered foci of carotid and left subclavian artery atherosclerotic calcification. Electronically Signed   By: Lowella Grip III M.D.   On: 06/22/2019 08:55   CT Cervical Spine Wo Contrast  Result Date: 06/22/2019 CLINICAL DATA:  Pain following fall.  Dementia. EXAM: CT HEAD WITHOUT CONTRAST CT CERVICAL SPINE WITHOUT CONTRAST TECHNIQUE: Multidetector CT imaging of the head and cervical spine was performed following the standard protocol without intravenous contrast. Multiplanar CT image reconstructions of the cervical spine were also generated. COMPARISON:  CT head and CT cervical spine July 10, 2016; head CT December 05, 2017 FINDINGS: CT HEAD FINDINGS Brain: Mild diffuse atrophy is stable. There is no intracranial mass, hemorrhage, extra-axial fluid collection, or midline shift. Prior infarcts involving portions of the posterior right frontal and right temporal lobe regions are stable. Right  infarct involves a portion of the right extreme capsule, stable. There is extensive small  vessel disease in the centra semiovale bilaterally. There is evidence of a prior infarct at the genu of the left internal capsule. Small infarct also noted in the medial left lentiform nucleus. Small vessel disease is also noted throughout the internal capsules bilaterally. No acute appearing infarct is evident. Vascular: No hyperdense vessel. There is calcification in each carotid siphon and distal vertebral artery. Skull: The bony calvarium appears intact. There is a fairly small left parietal scalp hematoma. Sinuses/Orbits: There is mucosal thickening in multiple ethmoid air cells. Visualized orbits appear symmetric bilaterally. Other: Mastoid air cells are clear. CT CERVICAL SPINE FINDINGS Alignment: There is again noted 2 mm of anterolisthesis of C3 on C4. There is 1 mm of anterolisthesis of C4 on C5. There is 1 mm of anterolisthesis of C7 on T1, stable. No new spondylolisthesis evident. Skull base and vertebrae: Pannus posterior to the odontoid is again noted without significant impression on the craniocervical junction region. Skull base and craniocervical junction regions appear stable. Bones are osteoporotic. No acute fracture evident. No blastic or lytic bone lesions. Soft tissues and spinal canal: Prevertebral soft tissues and predental space regions are normal. No evident cord or canal hematoma. No paraspinous lesions are evident. Disc levels: Moderately severe disc space narrowing at C5-6 and C6-7 remains stable. There is multifocal facet arthropathy. Exit foraminal narrowing is again noted at C3-4 bilaterally, at C4-5 on the right, at C5-6 on the right, and C6-7 on the right. No disc extrusion or high-grade stenosis evident. Upper chest: Visualized upper lung regions are clear. Other: There is are subcentimeter nodular opacities in the thyroid which do not warrant additional imaging surveillance based on consensus  criteria. There are foci of carotid artery and left subclavian artery calcification. IMPRESSION: CT head: Stable atrophy with supratentorial small vessel disease. Prior infarcts involving portions of the right posterior frontal and right temporal lobes. Basal ganglia region infarcts noted. No acute infarct evident. No mass or hemorrhage. Foci of arterial vascular calcification noted. Mucosal thickening noted in several ethmoid air cells. No evident fracture.  Small left parietal scalp hematoma noted. CT cervical spine: 1. No fracture. Slight spondylosis at several levels is stable compared to prior study and consistent with underlying spondylosis. No new spondylolisthesis. Bones osteoporotic. 2. Multilevel arthropathy, similar to prior study. No disc extrusion or high-grade stenosis evident. 3. Scattered foci of carotid and left subclavian artery atherosclerotic calcification. Electronically Signed   By: Lowella Grip III M.D.   On: 06/22/2019 08:55    ____________________________________________   PROCEDURES  Procedure(s) performed (including Critical Care):  Procedures   ____________________________________________   INITIAL IMPRESSION / ASSESSMENT AND PLAN / ED COURSE   Amanda Hogan was evaluated in Emergency Department on 06/22/2019 for the symptoms described in the history of present illness. She was evaluated in the context of the global COVID-19 pandemic, which necessitated consideration that the patient might be at risk for infection with the SARS-CoV-2 virus that causes COVID-19. Institutional protocols and algorithms that pertain to the evaluation of patients at risk for COVID-19 are in a state of rapid change based on information released by regulatory bodies including the CDC and federal and state organizations. These policies and algorithms were followed during the patient's care in the ED.     Patient presents with a fall.  Not on any blood thinners.  Will get CT head given  the hematoma the back of her head evaluate for intracranial hemorrhage, CT cervical to evaluate for cervical fracture.  No chest wall  pain or abdominal pain or extremity pain to suggest other injuries.  Given report of may be some dizziness will get EKG, cardiac marker, basic labs to evaluate for electrolyte abnormalities, AKI.  Patient slightly bradycardic but I reviewed her prior heart rates and she was in the 50s back in February.  It is sinus in nature and she does not seem to be dizzy at this time.  CT imaging was negative for any acute findings.  Patient provided a copy of report.  UA does have some bacteria in it but I suspect that this is more likely asymptomatic bacteriuria because patient's not having any symptoms has no white count or no fever.  Will hold off on treatment at this time.  Labs show may be some slight dehydration.  Patient given 500 cc of fluid.  Patient has ambulated with her walker.  States that she is feeling better and feels comfortable going back to her facility  I discussed the provisional nature of ED diagnosis, the treatment so far, the ongoing plan of care, follow up appointments and return precautions with the patient and any family or support people present. They expressed understanding and agreed with the plan, discharged home. ____________________________________________   FINAL CLINICAL IMPRESSION(S) / ED DIAGNOSES   Final diagnoses:  Fall, initial encounter  Injury of head, initial encounter      MEDICATIONS GIVEN DURING THIS VISIT:  Medications  acetaminophen (TYLENOL) tablet 1,000 mg (has no administration in time range)  sodium chloride 0.9 % bolus 500 mL (has no administration in time range)     ED Discharge Orders    None       Note:  This document was prepared using Dragon voice recognition software and may include unintentional dictation errors.   Vanessa Andrew, MD 06/22/19 1047

## 2019-06-22 NOTE — ED Notes (Signed)
Pt unable to sign for discharge d/t dementia 

## 2019-06-22 NOTE — Discharge Instructions (Addendum)
  CT imaging shows some old findings but nothing acute from today.  Take Tylenol 1 g every 8 hours to help with any discomfort.  Labs showed a slight dehydration with a little bit of elevation of kidney function which we have given her some fluids for.  Return to the ER for any other concerns  CT head: Stable atrophy with supratentorial small vessel disease. Prior infarcts involving portions of the right posterior frontal and right temporal lobes. Basal ganglia region infarcts noted. No acute infarct evident. No mass or hemorrhage.   Foci of arterial vascular calcification noted. Mucosal thickening noted in several ethmoid air cells.   No evident fracture.  Small left parietal scalp hematoma noted.   CT cervical spine:   1. No fracture. Slight spondylosis at several levels is stable compared to prior study and consistent with underlying spondylosis. No new spondylolisthesis. Bones osteoporotic.   2. Multilevel arthropathy, similar to prior study. No disc extrusion or high-grade stenosis evident.   3. Scattered foci of carotid and left subclavian artery atherosclerotic calcification.

## 2019-06-24 DIAGNOSIS — E538 Deficiency of other specified B group vitamins: Secondary | ICD-10-CM | POA: Diagnosis not present

## 2019-06-24 DIAGNOSIS — F0151 Vascular dementia with behavioral disturbance: Secondary | ICD-10-CM | POA: Diagnosis not present

## 2019-06-24 DIAGNOSIS — E860 Dehydration: Secondary | ICD-10-CM | POA: Diagnosis not present

## 2019-06-24 DIAGNOSIS — I10 Essential (primary) hypertension: Secondary | ICD-10-CM | POA: Diagnosis not present

## 2019-06-24 DIAGNOSIS — R8271 Bacteriuria: Secondary | ICD-10-CM | POA: Diagnosis not present

## 2019-06-24 DIAGNOSIS — W19XXXD Unspecified fall, subsequent encounter: Secondary | ICD-10-CM | POA: Diagnosis not present

## 2019-06-24 DIAGNOSIS — R269 Unspecified abnormalities of gait and mobility: Secondary | ICD-10-CM | POA: Diagnosis not present

## 2019-06-24 DIAGNOSIS — T148XXA Other injury of unspecified body region, initial encounter: Secondary | ICD-10-CM | POA: Diagnosis not present

## 2019-06-24 DIAGNOSIS — R634 Abnormal weight loss: Secondary | ICD-10-CM | POA: Diagnosis not present

## 2019-07-01 IMAGING — DX DG CHEST 1V PORT
1 series · 1 of 1 positions shown · non-contrast
Comparison: 05/21/2015

CLINICAL DATA: Epigastric abdominal pain.

EXAM:
PORTABLE CHEST 1 VIEW

[chest ap]
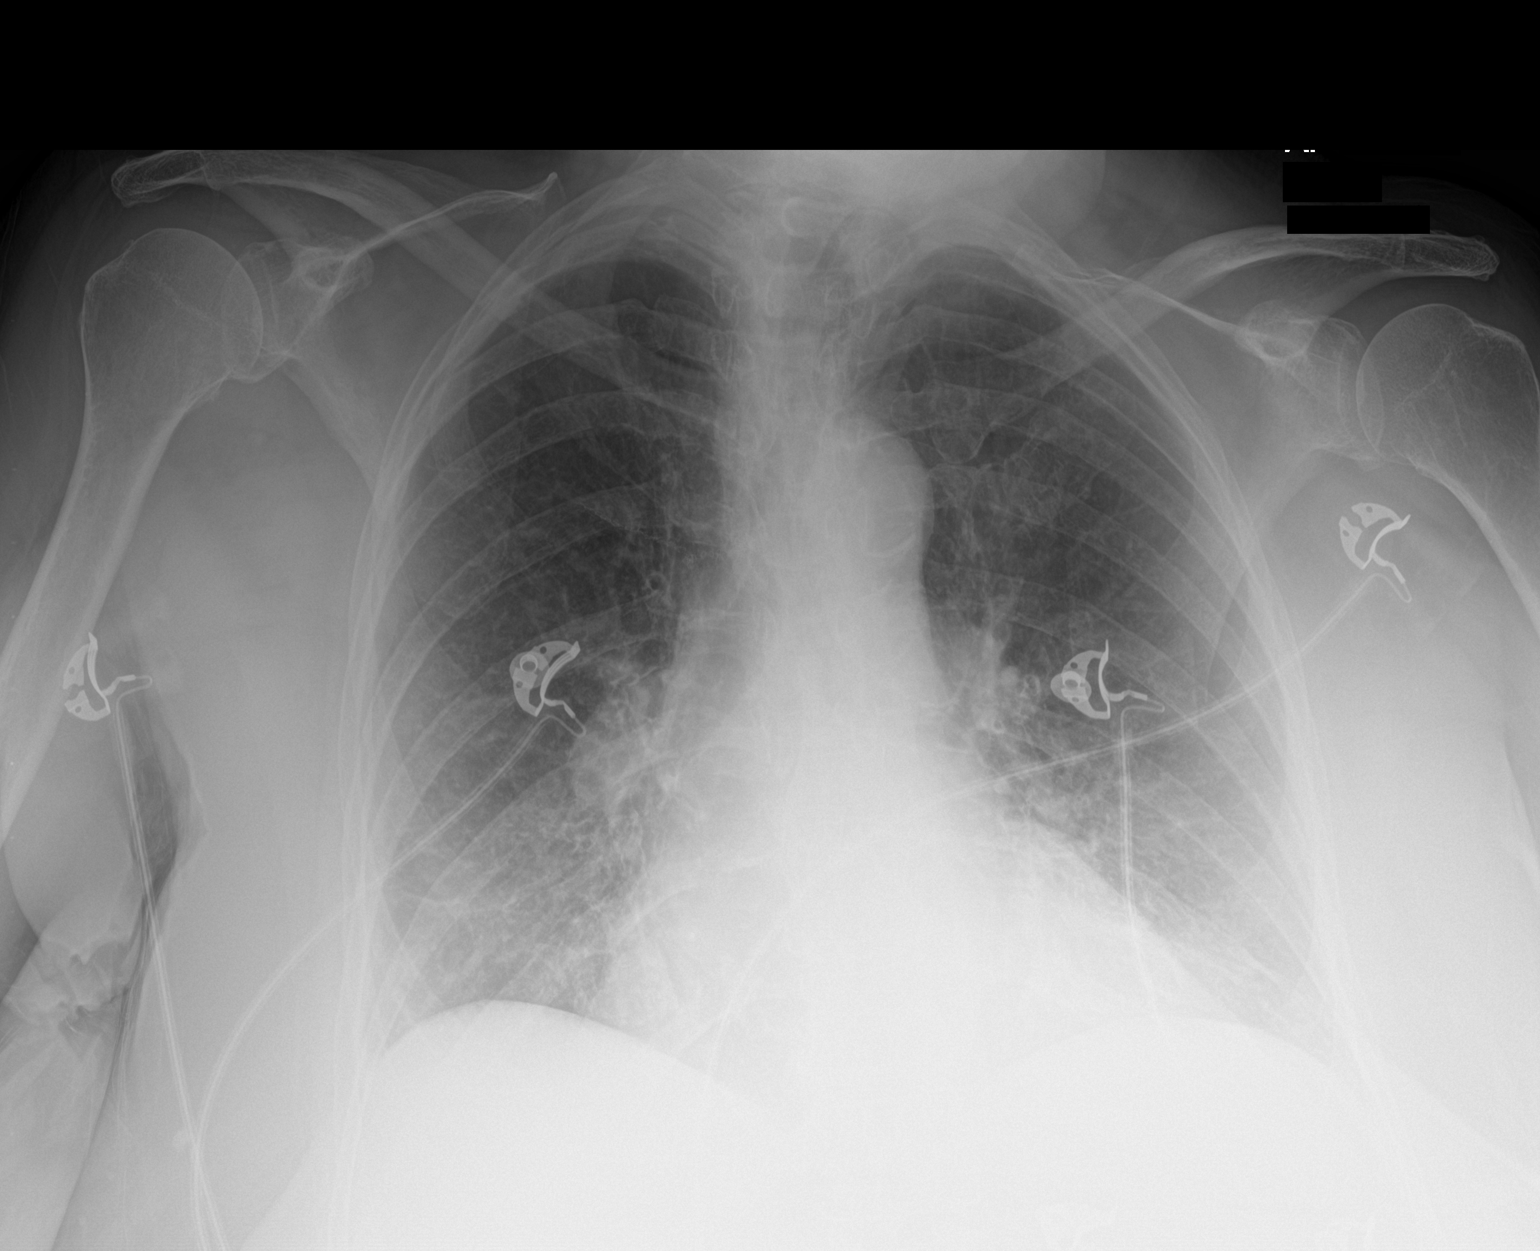

[1 of 1 positions shown; findings below may reference images not displayed]

FINDINGS: The heart is within normal limits in size given the AP projection
and portable technique. Stable tortuosity and calcification of the
thoracic aorta. Mild chronic bronchitic type interstitial lung
changes but no definite infiltrates, edema or effusions. The bony
thorax is intact.
IMPRESSION: Chronic lung changes but no acute pulmonary findings.

## 2019-07-06 ENCOUNTER — Emergency Department
Admission: EM | Admit: 2019-07-06 | Discharge: 2019-07-06 | Disposition: A | Discharge status: Home / Self Care | Payer: PPO | Attending: Emergency Medicine | Admitting: Emergency Medicine

## 2019-07-06 ENCOUNTER — Other Ambulatory Visit: Payer: Self-pay

## 2019-07-06 ENCOUNTER — Emergency Department: Payer: PPO

## 2019-07-06 DIAGNOSIS — Z7902 Long term (current) use of antithrombotics/antiplatelets: Secondary | ICD-10-CM | POA: Diagnosis not present

## 2019-07-06 DIAGNOSIS — R52 Pain, unspecified: Secondary | ICD-10-CM | POA: Diagnosis not present

## 2019-07-06 DIAGNOSIS — Z8673 Personal history of transient ischemic attack (TIA), and cerebral infarction without residual deficits: Secondary | ICD-10-CM | POA: Diagnosis not present

## 2019-07-06 DIAGNOSIS — Z7982 Long term (current) use of aspirin: Secondary | ICD-10-CM | POA: Diagnosis not present

## 2019-07-06 DIAGNOSIS — S3993XA Unspecified injury of pelvis, initial encounter: Secondary | ICD-10-CM | POA: Diagnosis not present

## 2019-07-06 DIAGNOSIS — R519 Headache, unspecified: Secondary | ICD-10-CM | POA: Insufficient documentation

## 2019-07-06 DIAGNOSIS — I5031 Acute diastolic (congestive) heart failure: Secondary | ICD-10-CM | POA: Diagnosis not present

## 2019-07-06 DIAGNOSIS — R400 Somnolence: Secondary | ICD-10-CM

## 2019-07-06 DIAGNOSIS — Z79899 Other long term (current) drug therapy: Secondary | ICD-10-CM | POA: Insufficient documentation

## 2019-07-06 DIAGNOSIS — S0990XA Unspecified injury of head, initial encounter: Secondary | ICD-10-CM | POA: Diagnosis not present

## 2019-07-06 DIAGNOSIS — R404 Transient alteration of awareness: Secondary | ICD-10-CM | POA: Diagnosis not present

## 2019-07-06 DIAGNOSIS — I491 Atrial premature depolarization: Secondary | ICD-10-CM | POA: Diagnosis not present

## 2019-07-06 DIAGNOSIS — I110 Hypertensive heart disease with heart failure: Secondary | ICD-10-CM | POA: Insufficient documentation

## 2019-07-06 DIAGNOSIS — S299XXA Unspecified injury of thorax, initial encounter: Secondary | ICD-10-CM | POA: Diagnosis not present

## 2019-07-06 DIAGNOSIS — R0902 Hypoxemia: Secondary | ICD-10-CM | POA: Diagnosis not present

## 2019-07-06 DIAGNOSIS — I4891 Unspecified atrial fibrillation: Secondary | ICD-10-CM | POA: Diagnosis not present

## 2019-07-06 DIAGNOSIS — F0390 Unspecified dementia without behavioral disturbance: Secondary | ICD-10-CM | POA: Diagnosis not present

## 2019-07-06 DIAGNOSIS — R001 Bradycardia, unspecified: Secondary | ICD-10-CM | POA: Diagnosis not present

## 2019-07-06 DIAGNOSIS — S199XXA Unspecified injury of neck, initial encounter: Secondary | ICD-10-CM | POA: Diagnosis not present

## 2019-07-06 LAB — CBC WITH DIFFERENTIAL/PLATELET
Abs Immature Granulocytes: 0.02 10*3/uL (ref 0.00–0.07)
Basophils Absolute: 0 10*3/uL (ref 0.0–0.1)
Basophils Relative: 1 %
Eosinophils Absolute: 0.2 10*3/uL (ref 0.0–0.5)
Eosinophils Relative: 4 %
HCT: 34.9 % — ABNORMAL LOW (ref 36.0–46.0)
Hemoglobin: 11.7 g/dL — ABNORMAL LOW (ref 12.0–15.0)
Immature Granulocytes: 0 %
Lymphocytes Relative: 20 %
Lymphs Abs: 1.1 10*3/uL (ref 0.7–4.0)
MCH: 31.6 pg (ref 26.0–34.0)
MCHC: 33.5 g/dL (ref 30.0–36.0)
MCV: 94.3 fL (ref 80.0–100.0)
Monocytes Absolute: 0.6 10*3/uL (ref 0.1–1.0)
Monocytes Relative: 10 %
Neutro Abs: 3.8 10*3/uL (ref 1.7–7.7)
Neutrophils Relative %: 65 %
Platelets: 237 10*3/uL (ref 150–400)
RBC: 3.7 MIL/uL — ABNORMAL LOW (ref 3.87–5.11)
RDW: 13.5 % (ref 11.5–15.5)
WBC: 5.8 10*3/uL (ref 4.0–10.5)
nRBC: 0 % (ref 0.0–0.2)

## 2019-07-06 LAB — COMPREHENSIVE METABOLIC PANEL
ALT: 14 U/L (ref 0–44)
AST: 15 U/L (ref 15–41)
Albumin: 3.9 g/dL (ref 3.5–5.0)
Alkaline Phosphatase: 73 U/L (ref 38–126)
Anion gap: 7 (ref 5–15)
BUN: 24 mg/dL — ABNORMAL HIGH (ref 8–23)
CO2: 29 mmol/L (ref 22–32)
Calcium: 9.4 mg/dL (ref 8.9–10.3)
Chloride: 103 mmol/L (ref 98–111)
Creatinine, Ser: 1.14 mg/dL — ABNORMAL HIGH (ref 0.44–1.00)
GFR calc Af Amer: 50 mL/min — ABNORMAL LOW (ref >60)
GFR calc non Af Amer: 43 mL/min — ABNORMAL LOW (ref >60)
Glucose, Bld: 116 mg/dL — ABNORMAL HIGH (ref 70–99)
Potassium: 4.3 mmol/L (ref 3.5–5.1)
Sodium: 139 mmol/L (ref 135–145)
Total Bilirubin: 1 mg/dL (ref 0.3–1.2)
Total Protein: 7.4 g/dL (ref 6.5–8.1)

## 2019-07-06 LAB — TROPONIN I (HIGH SENSITIVITY): Troponin I (High Sensitivity): 5 ng/L (ref <18)

## 2019-07-06 MED ORDER — ACETAMINOPHEN 325 MG PO TABS
650.0000 mg | ORAL_TABLET | Freq: Once | ORAL | Status: AC
  Administered 2019-07-06: 650 mg via ORAL
  Filled 2019-07-06: qty 2

## 2019-07-06 NOTE — ED Notes (Signed)
Pt's daughter Onalee Hua updated on pt's status and plan of care

## 2019-07-06 NOTE — Discharge Instructions (Signed)
Your labs and CT head and heart function are normal   See your doctor   Return to ER if she has worse sleepiness and confusion and lethargy.

## 2019-07-06 NOTE — ED Provider Notes (Signed)
[facility] EMERGENCY DEPARTMENT Provider Note   CSN: [id number] Arrival date & time: 07/06/19  1627     History Chief Complaint  Patient presents with  . Headache    Amanda Hogan is a 86 y.o. female history of hypertension, dementia, previous stroke here presenting with altered mental status.  Patient currently resides at [facility].  She is demented at baseline and was in her wheelchair and barely fell asleep.  Staff at difficulty arousing her so eventually put her on the floor.  They tried to put in a AED on her but instead put a nebulizer on her face and then she woke up. She never lost pulses.  She is complaining of some headaches.  Patient did not remember anything that happened.  The history is provided by the EMS personnel.  Level V caveat- dementia      Past Medical History:  Diagnosis Date  . Acid reflux   . Angina   . Anxiety 02/23/2012  . Arthritis   . Chest pain    negative lexiscan myoview 53/13, last echo 02/24/12-EF 123456, mid systolic obliteration of the LV, cavity aortic sclerosis  . Complication of anesthesia    "w/my back; gave me too much; thought I'd had stroke but didn't"  . Dementia ([place]) 02/23/2012  . Exertional dyspnea   . Fibromyalgia   . Heart murmur   . High cholesterol   . HTN (hypertension)   . Hypertension   . Kidney stone   . Memory disorder, possibly not taking home meds at times 07/22/2011  . Mild carotid artery disease ([place]) 08/05/11   carotid dopplers, mild disease  . Restless leg syndrome   . Sinus malignant neoplasm ([place])   . Stroke [facility])    "said it looked like bullet holes; had a bunch"  . TIA (transient ischemic attack)     Patient Active Problem List   Diagnosis Date Noted  . Acute kidney injury ([place]) 12/27/2014  . Acute CVA (cerebrovascular accident) ([place]) 12/16/2014  . Diastolic dysfunction-grade 2 10/29/2014  . Pulmonary hypertension-moderate 10/29/2014  . Troponin level elevated-suspect  demand ischemia from AF with RVR 10/28/2014  . Lymphoma history 10/28/2014  . Acute diastolic CHF (congestive heart failure) ([place]) 10/27/2014  . Atrial fibrillation with RVR (new)   . TIA (transient ischemic attack) 02/25/2012  . Encephalopathy acute 02/23/2012  . Anemia 02/23/2012  . Chronic back pain 02/23/2012  . Anxiety 02/23/2012  . Mild carotid artery disease ([place]) 08/05/2011  . Memory disorder, possibly not taking home meds at times 07/22/2011  . Hypertension, at times poorly controlled 07/21/2011  . Chest pain-low risk Myoview May 2013 07/21/2011  . Hyperlipidemia 07/21/2011  . Gastroesophageal reflux disease 07/21/2011  . History of TIA (transient ischemic attack) 07/21/2011  . Obesity 07/21/2011    Past Surgical History:  Procedure Laterality Date  . BREAST CYST EXCISION     left  . CARPAL TUNNEL RELEASE     right  . CATARACT EXTRACTION W/ INTRAOCULAR LENS  IMPLANT, BILATERAL    . CHOLECYSTECTOMY  2005  . ELBOW BURSA SURGERY     left  . EYE SURGERY  1940   fixed my crossed eyes"  . HERNIA REPAIR  2007   "stomach"  . KIDNEY STONE SURGERY     "right side; cut it out"  . KNEE CARTILAGE SURGERY     right  . LUMBAR SPINE SURGERY     "put box in my lower back"  . LUNG SURGERY     "  made 3 holes and pulled gel out"  . VAGINAL HYSTERECTOMY       OB History   No obstetric history on file.     Family History  Problem Relation Age of Onset  . Diabetes Neg Hx     Social History   Tobacco Use  . Smoking status: Never Smoker  . Smokeless tobacco: Never Used  Substance Use Topics  . Alcohol use: No  . Drug use: No    Home Medications Prior to Admission medications   Medication Sig Start Date End Date Taking? Authorizing Provider  acetaminophen (TYLENOL) 500 MG tablet Take 1,000 mg by mouth every 8 (eight) hours.    Yes [provider]  acetaminophen (TYLENOL) 500 MG tablet Take 500 mg by mouth every 4 (four) hours as needed for fever or headache.    Yes [provider]  alum & mag hydroxide-simeth ([place]) 200-200-20 MG/5ML suspension Take 30 mLs by mouth every 6 (six) hours as needed for indigestion or heartburn.   Yes [provider]  amLODipine (NORVASC) 2.5 MG tablet Take 2.5 mg by mouth daily.   Yes [provider]  aspirin (ASPIRIN CHILDRENS) 81 MG chewable tablet Chew 81 mg by mouth every morning.   Yes [provider]  atorvastatin (LIPITOR) 40 MG tablet Take 1 tablet by mouth at bedtime.  04/25/15  Yes [provider]  calcium carbonate (TUMS - DOSED IN MG ELEMENTAL CALCIUM) 500 MG chewable tablet Chew 500 mg by mouth 3 (three) times daily before meals.   Yes [provider]  clopidogrel (PLAVIX) 75 MG tablet Take 75 mg by mouth daily.   Yes [provider]  cyanocobalamin (,VITAMIN B-12,) 1000 MCG/ML injection Inject 1,000 mcg into the muscle every 30 (thirty) days. 15th   Yes [provider]  escitalopram (LEXAPRO) 5 MG tablet Take 5 mg by mouth at bedtime.    Yes [provider]  furosemide (LASIX) 20 MG tablet Take 30 mg by mouth daily.    Yes [provider]  gabapentin (NEURONTIN) 300 MG capsule Take 300 mg by mouth 3 (three) times daily.   Yes [provider]  guaiFENesin (ROBITUSSIN) 100 MG/5ML liquid Take 200 mg by mouth every 6 (six) hours as needed for cough.   Yes [provider]  loperamide (IMODIUM) 2 MG capsule Take 2 mg by mouth as needed for diarrhea or loose stools. (max 8 doses in 24 hours)   Yes [provider]  LORazepam (ATIVAN) 0.5 MG tablet Take 0.5 mg by mouth 2 (two) times daily as needed for anxiety.   Yes [provider]  magnesium hydroxide (MILK OF MAGNESIA) 400 MG/5ML suspension Take 30 mLs by mouth at bedtime as needed for mild constipation.   Yes [provider]  memantine (NAMENDA) 5 MG tablet Take 5 mg by mouth daily.    Yes [provider]  metoprolol  succinate (TOPROL-XL) 100 MG 24 hr tablet Take 100 mg by mouth daily. Take with or immediately following a meal.   Yes [provider]  neomycin-bacitracin-polymyxin (NEOSPORIN) ointment Apply 1 application topically as needed for wound care.   Yes [provider]  ondansetron (ZOFRAN) 4 MG tablet Take 4 mg by mouth every 6 (six) hours as needed for nausea or vomiting.   Yes [provider]  polyethylene glycol (MIRALAX / GLYCOLAX) 17 g packet Take 17 g by mouth daily as needed for mild constipation.   Yes [provider]  QUEtiapine (  SEROQUEL) 25 MG tablet Take 12.5 mg by mouth 2 (two) times daily as needed (agitation).    Yes [provider]  rOPINIRole (REQUIP) 1 MG tablet Take 1 mg by mouth 3 (three) times daily.    Yes [provider]  senna (SENOKOT) 8.6 MG tablet Take 2 tablets by mouth at bedtime.    Yes [provider]  spironolactone (ALDACTONE) 25 MG tablet Take 25 mg by mouth daily.   Yes [provider]    Allergies    Iohexol, Codeine sulfate, and Contrast media [iodinated diagnostic agents]  Review of Systems   Review of Systems  Neurological: Positive for headaches.  All other systems reviewed and are negative.   Physical Exam Updated Vital Signs BP (!) 144/53 (BP Location: Right Arm)   Pulse (!) 59   Temp (!) 97.5 F (36.4 C) (Axillary)   Resp 16   Ht 5\' 6"  (1.676 m)   Wt 68 kg   SpO2 100%   BMI 24.20 kg/m   Physical Exam Vitals and nursing note reviewed.  Constitutional:      Comments: Demented   HENT:     Head: Normocephalic.     Comments: No obvious scalp hematoma  Eyes:     Extraocular Movements: Extraocular movements intact.  Cardiovascular:     Rate and Rhythm: Normal rate and regular rhythm.     Heart sounds: Normal heart sounds.  Pulmonary:     Effort: Pulmonary effort is normal.  Abdominal:     Palpations: Abdomen is soft.  Musculoskeletal:        General: Normal range of  motion.     Cervical back: Normal range of motion and neck supple.     Comments: No obvious spinal tenderness, pelvis stable, nl ROM bilateral hips, no obvious extremity trauma   Skin:    General: Skin is warm.  Neurological:     Comments: Demented, moving all extremities, difficulty following commands   Psychiatric:        Mood and Affect: Mood normal.     ED Results / Procedures / Treatments   Labs (all labs ordered are listed, but only abnormal results are displayed) Labs Reviewed  CBC WITH DIFFERENTIAL/PLATELET - Abnormal; Notable for the following components:      Result Value   RBC 3.70 (*)    Hemoglobin 11.7 (*)    HCT 34.9 (*)    All other components within normal limits  COMPREHENSIVE METABOLIC PANEL - Abnormal; Notable for the following components:   Glucose, Bld 116 (*)    BUN 24 (*)    Creatinine, Ser 1.14 (*)    GFR calc non Af Amer 43 (*)    GFR calc Af Amer 50 (*)    All other components within normal limits  TROPONIN I (HIGH SENSITIVITY)  TROPONIN I (HIGH SENSITIVITY)    EKG EKG Interpretation  Date/Time:  Saturday July 06 2019 16:48:26 EDT Ventricular Rate:  59 PR Interval:  188 QRS Duration: 80 QT Interval:  438 QTC Calculation: 433 R Axis:   35 Text Interpretation: Sinus bradycardia Nonspecific T wave abnormality Abnormal ECG When compared with ECG of 22-Jun-2019 08:16, PREVIOUS ECG IS PRESENT Confirmed by Wandra Arthurs [phone]) on 07/06/2019 5:03:09 PM   Radiology DG Chest 1 View  Result Date: 07/06/2019 CLINICAL DATA:  Fall. EXAM: CHEST  1 VIEW COMPARISON:  05/18/2019 FINDINGS: Cardiomegaly. Vascular congestion is new from prior exam. No focal airspace disease, pneumothorax, or pleural effusion. No acute  osseous abnormalities are seen. IMPRESSION: Increased cardiomegaly with vascular congestion since radiographs 2 months ago. No evidence of acute traumatic injury. Electronically Signed   By: Keith Rake M.D.   On: 07/06/2019 18:00   DG Pelvis  1-2 Views  Result Date: 07/06/2019 CLINICAL DATA:  Fall. EXAM: PELVIS - 1-2 VIEW COMPARISON:  None. FINDINGS: The cortical margins of the bony pelvis are intact. No fracture. Pubic symphysis and sacroiliac joints are congruent. Both femoral heads are well-seated in the respective acetabula. Age related degenerative change of both hips. Surgical hardware in the lower lumbar spine is partially included. IMPRESSION: No pelvic fracture. Electronically Signed   By: Keith Rake M.D.   On: 07/06/2019 18:01   CT Head Wo Contrast  Result Date: 07/06/2019 CLINICAL DATA:  Head trauma, headache EXAM: CT HEAD WITHOUT CONTRAST TECHNIQUE: Contiguous axial images were obtained from the base of the skull through the vertex without intravenous contrast. COMPARISON:  Head CT 2 weeks ago 06/22/2019 FINDINGS: Brain: No acute hemorrhage. No subdural or extra-axial collection. Generalized atrophy with stable advanced chronic small vessel ischemia. Remote infarcts in the right frontal and temporal lobes. Remote lacunar infarct in the left basal ganglia and thalamus. Small remote cerebellar infarcts. No evidence of acute ischemia. No hydrocephalus, midline shift, or mass effect. Vascular: Atherosclerosis of skullbase vasculature without hyperdense vessel or abnormal calcification. Skull: No fracture or focal lesion. Chronic frontal hyperostosis. Sinuses/Orbits: Scattered paranasal sinus mucosal thickening. No acute findings. Included orbits are unremarkable. Other: None. IMPRESSION: 1. No acute intracranial abnormality. No skull fracture. 2. Unchanged atrophy, advanced chronic small vessel ischemia, and remote infarcts. Electronically Signed   By: Keith Rake M.D.   On: 07/06/2019 17:41   CT Cervical Spine Wo Contrast  Result Date: 07/06/2019 CLINICAL DATA:  Spine fracture, cervical, traumatic EXAM: CT CERVICAL SPINE WITHOUT CONTRAST TECHNIQUE: Multidetector CT imaging of the cervical spine was performed without  intravenous contrast. Multiplanar CT image reconstructions were also generated. COMPARISON:  CT 2 weeks ago 06/22/2019 FINDINGS: Alignment: No traumatic subluxation. Stable degenerative anterolisthesis of C3 on C4 and C4 on C5. Trace anterolisthesis of C7 on T1 is also unchanged. Skull base and vertebrae: No acute fracture. Vertebral body heights are maintained. The dens and skull base are intact. Degenerative changes C1-C2 with pannus formation, stable from prior. Soft tissues and spinal canal: No prevertebral fluid or swelling. No visible canal hematoma. Disc levels: Diffuse multilevel degenerative disc disease and facet hypertrophy, unchanged. Upper chest: No acute findings. Other: Stable small thyroid nodules. In the setting of significant comorbidities or limited life expectancy, no follow-up recommended (ref: J Am Coll Radiol. 2015 Feb;12(2): 143-50). IMPRESSION: 1. No acute fracture or subluxation of the cervical spine. 2. Multilevel degenerative disc disease and facet hypertrophy. Electronically Signed   By: Keith Rake M.D.   On: 07/06/2019 17:45    Procedures Procedures (including critical care time)  Medications Ordered in ED Medications  acetaminophen (TYLENOL) tablet 650 mg (650 mg Oral Given 07/06/19 1747)    ED Course  I have reviewed the triage vital signs and the nursing notes.  Pertinent labs & imaging results that were available during my care of the patient were reviewed by me and considered in my medical decision making (see chart for details).    MDM Rules/Calculators/A&P                     ARRIN REMILLARD is a 86 y.o. female is here with excessive sleepiness.  Patient is  now awake and alert.  Patient is demented at baseline and unable to give much history.  I think she is likely just sleepy and is hard of hearing.  Will check electrolytes to rule out electrolyte abnormality and CT head and x-rays.   Additional history obtained:  Additional history obtained from EMS.  Previous records obtained and reviewed   Lab Tests:  I Ordered, reviewed, and interpreted labs, which included:  CBC, CMP, Trop,   Imaging Studies ordered:  I ordered imaging studies which included CXR, CT head, I independently visualized and interpreted imaging which showed no fractures or bleed.  Medicines ordered:  I ordered medication tylenol  For headaches    7:41 PM Labs unremarkable. CT unremarkable.  X-ray is unremarkable.  Labs at baseline stable for discharge.    Final Clinical Impression(s) / ED Diagnoses Final diagnoses:  None    Rx / DC Orders ED Discharge Orders    None       Drenda Freeze, MD 07/06/19 1942

## 2019-07-06 NOTE — ED Notes (Signed)
Pt otf for imaging 

## 2019-07-06 NOTE — ED Triage Notes (Signed)
Pt arrives via [facility] from [facility] after being taken out of her chair into the floor per EMS and staff reports. Staff reports that pt would not wake up so they "drug her into the floor" from the chair. PT c/o headache but no other complaints voiced. Pt alert to self, situation, place. No obvious deformities observed. PT has yellow DNR form

## 2019-07-08 DIAGNOSIS — R519 Headache, unspecified: Secondary | ICD-10-CM | POA: Diagnosis not present

## 2019-07-08 DIAGNOSIS — I517 Cardiomegaly: Secondary | ICD-10-CM | POA: Diagnosis not present

## 2019-07-08 DIAGNOSIS — F0390 Unspecified dementia without behavioral disturbance: Secondary | ICD-10-CM | POA: Diagnosis not present

## 2019-07-08 DIAGNOSIS — R001 Bradycardia, unspecified: Secondary | ICD-10-CM | POA: Diagnosis not present

## 2019-07-08 DIAGNOSIS — R4189 Other symptoms and signs involving cognitive functions and awareness: Secondary | ICD-10-CM | POA: Diagnosis not present

## 2019-07-08 DIAGNOSIS — I10 Essential (primary) hypertension: Secondary | ICD-10-CM | POA: Diagnosis not present

## 2019-07-08 DIAGNOSIS — R269 Unspecified abnormalities of gait and mobility: Secondary | ICD-10-CM | POA: Diagnosis not present

## 2019-07-12 DIAGNOSIS — Z7902 Long term (current) use of antithrombotics/antiplatelets: Secondary | ICD-10-CM | POA: Diagnosis not present

## 2019-07-12 DIAGNOSIS — D631 Anemia in chronic kidney disease: Secondary | ICD-10-CM | POA: Diagnosis not present

## 2019-07-12 DIAGNOSIS — F0151 Vascular dementia with behavioral disturbance: Secondary | ICD-10-CM | POA: Diagnosis not present

## 2019-07-12 DIAGNOSIS — G8929 Other chronic pain: Secondary | ICD-10-CM | POA: Diagnosis not present

## 2019-07-12 DIAGNOSIS — I5031 Acute diastolic (congestive) heart failure: Secondary | ICD-10-CM | POA: Diagnosis not present

## 2019-07-12 DIAGNOSIS — K219 Gastro-esophageal reflux disease without esophagitis: Secondary | ICD-10-CM | POA: Diagnosis not present

## 2019-07-12 DIAGNOSIS — Z8571 Personal history of Hodgkin lymphoma: Secondary | ICD-10-CM | POA: Diagnosis not present

## 2019-07-12 DIAGNOSIS — E785 Hyperlipidemia, unspecified: Secondary | ICD-10-CM | POA: Diagnosis not present

## 2019-07-12 DIAGNOSIS — I130 Hypertensive heart and chronic kidney disease with heart failure and stage 1 through stage 4 chronic kidney disease, or unspecified chronic kidney disease: Secondary | ICD-10-CM | POA: Diagnosis not present

## 2019-07-12 DIAGNOSIS — Z8673 Personal history of transient ischemic attack (TIA), and cerebral infarction without residual deficits: Secondary | ICD-10-CM | POA: Diagnosis not present

## 2019-07-12 DIAGNOSIS — M799 Soft tissue disorder, unspecified: Secondary | ICD-10-CM | POA: Diagnosis not present

## 2019-07-12 DIAGNOSIS — I4891 Unspecified atrial fibrillation: Secondary | ICD-10-CM | POA: Diagnosis not present

## 2019-07-12 DIAGNOSIS — Z6841 Body Mass Index (BMI) 40.0 and over, adult: Secondary | ICD-10-CM | POA: Diagnosis not present

## 2019-07-12 DIAGNOSIS — G2581 Restless legs syndrome: Secondary | ICD-10-CM | POA: Diagnosis not present

## 2019-07-12 DIAGNOSIS — E669 Obesity, unspecified: Secondary | ICD-10-CM | POA: Diagnosis not present

## 2019-07-12 DIAGNOSIS — M1990 Unspecified osteoarthritis, unspecified site: Secondary | ICD-10-CM | POA: Diagnosis not present

## 2019-07-12 DIAGNOSIS — F419 Anxiety disorder, unspecified: Secondary | ICD-10-CM | POA: Diagnosis not present

## 2019-07-12 DIAGNOSIS — Z7982 Long term (current) use of aspirin: Secondary | ICD-10-CM | POA: Diagnosis not present

## 2019-07-12 DIAGNOSIS — M549 Dorsalgia, unspecified: Secondary | ICD-10-CM | POA: Diagnosis not present

## 2019-07-12 DIAGNOSIS — Z9181 History of falling: Secondary | ICD-10-CM | POA: Diagnosis not present

## 2019-07-12 DIAGNOSIS — I779 Disorder of arteries and arterioles, unspecified: Secondary | ICD-10-CM | POA: Diagnosis not present

## 2019-07-12 DIAGNOSIS — I272 Pulmonary hypertension, unspecified: Secondary | ICD-10-CM | POA: Diagnosis not present

## 2019-07-12 DIAGNOSIS — N189 Chronic kidney disease, unspecified: Secondary | ICD-10-CM | POA: Diagnosis not present

## 2019-07-12 DIAGNOSIS — D510 Vitamin B12 deficiency anemia due to intrinsic factor deficiency: Secondary | ICD-10-CM | POA: Diagnosis not present

## 2019-07-15 DIAGNOSIS — I517 Cardiomegaly: Secondary | ICD-10-CM | POA: Diagnosis not present

## 2019-07-15 DIAGNOSIS — F0151 Vascular dementia with behavioral disturbance: Secondary | ICD-10-CM | POA: Diagnosis not present

## 2019-07-15 DIAGNOSIS — I10 Essential (primary) hypertension: Secondary | ICD-10-CM | POA: Diagnosis not present

## 2019-07-15 DIAGNOSIS — M159 Polyosteoarthritis, unspecified: Secondary | ICD-10-CM | POA: Diagnosis not present

## 2019-07-15 DIAGNOSIS — R269 Unspecified abnormalities of gait and mobility: Secondary | ICD-10-CM | POA: Diagnosis not present

## 2019-07-15 DIAGNOSIS — I2510 Atherosclerotic heart disease of native coronary artery without angina pectoris: Secondary | ICD-10-CM | POA: Diagnosis not present

## 2019-07-15 DIAGNOSIS — R001 Bradycardia, unspecified: Secondary | ICD-10-CM | POA: Diagnosis not present

## 2019-07-15 DIAGNOSIS — M545 Low back pain: Secondary | ICD-10-CM | POA: Diagnosis not present

## 2019-07-15 DIAGNOSIS — R42 Dizziness and giddiness: Secondary | ICD-10-CM | POA: Diagnosis not present

## 2019-07-15 DIAGNOSIS — G8929 Other chronic pain: Secondary | ICD-10-CM | POA: Diagnosis not present

## 2019-07-17 ENCOUNTER — Other Ambulatory Visit: Payer: Self-pay

## 2019-07-17 ENCOUNTER — Non-Acute Institutional Stay: Payer: PPO | Admitting: Adult Health Nurse Practitioner

## 2019-07-17 DIAGNOSIS — F0390 Unspecified dementia without behavioral disturbance: Secondary | ICD-10-CM | POA: Diagnosis not present

## 2019-07-17 DIAGNOSIS — Z515 Encounter for palliative care: Secondary | ICD-10-CM | POA: Diagnosis not present

## 2019-07-17 NOTE — Progress Notes (Signed)
[facility] Consult Note Telephone: [phone]  Fax: [phone]  PATIENT NAME: Amanda Hogan DOB: [birth year] MRN: [id number]  PRIMARY CARE PROVIDER:   Raymondo Band, MD  REFERRING PROVIDER:  Raymondo Band, MD [address] [place],  [place] [address]  RESPONSIBLE PARTY:   Son Amanda Hogan [phone]; Amanda Hogan daughter [phone]    RECOMMENDATIONS and PLAN:  1.  Advanced care planning. Patient is a DNR.  2.  Dementia. FAST 6d. Patient is able to ambulate with walker. She requires some assistance with ADLs and is incontinent of B&B. She is able to feed herself.  Patient was seen in ER on 06/22/19 post fall.  She was bending over to pick something up and fell backwards and hit her head.  No acute findings upon evaluation.  Appetite is good with no reported weight loss.  Continue supportive care at facility.    No new concerns reported by staff or daughter.  Palliative care will continue to monitor for symptom management/decline and make recommendations as needed.  Will follow up in 4-6 weeks.      I spent 30 minutes providing this consultation,  from 10:00 to 10:30 including time with patient/family, chart review, provider coordination, and documentation. More than 50% of the time in this consultation was spent coordinating communication.   HISTORY OF PRESENT ILLNESS:  Amanda Hogan is a 86 y.o. year old female with multiple medical problems including dementia, HTN, OA, CAD, fibromyalgia. Palliative Care was asked to help address goals of care.   CODE STATUS: DNR  PPS: 40% HOSPICE ELIGIBILITY/DIAGNOSIS: TBD  PHYSICAL EXAM:   General:patient sitting on edge of bed in NAD Cardiovascular: regular rate and rhythm Pulmonary: lung sounds clear; normal respiratory effort Abdomen: soft, nontender, + bowel sounds GU: no suprapubic tenderness Extremities: no edema, no joint deformities Skin: no rashes on  exposed skin Neurological: Weakness; A&O to self  PAST MEDICAL HISTORY:  Past Medical History:  Diagnosis Date  . Acid reflux   . Angina   . Anxiety 02/23/2012  . Arthritis   . Chest pain    negative lexiscan myoview 53/13, last echo 02/24/12-EF 123456, mid systolic obliteration of the LV, cavity aortic sclerosis  . Complication of anesthesia    "w/my back; gave me too much; thought I'd had stroke but didn't"  . Dementia ([place]) 02/23/2012  . Exertional dyspnea   . Fibromyalgia   . Heart murmur   . High cholesterol   . HTN (hypertension)   . Hypertension   . Kidney stone   . Memory disorder, possibly not taking home meds at times 07/22/2011  . Mild carotid artery disease ([place]) 08/05/11   carotid dopplers, mild disease  . Restless leg syndrome   . Sinus malignant neoplasm ([place])   . Stroke [facility])    "said it looked like bullet holes; had a bunch"  . TIA (transient ischemic attack)     SOCIAL HX:  Social History   Tobacco Use  . Smoking status: Never Smoker  . Smokeless tobacco: Never Used  Substance Use Topics  . Alcohol use: No    ALLERGIES:  Allergies  Allergen Reactions  . Iohexol Shortness Of Breath and Itching  . Codeine Sulfate Other (See Comments)    Unknown reaction.  . Contrast Media [Iodinated Diagnostic Agents] Other (See Comments)    Reaction:  Unknown      PERTINENT MEDICATIONS:  Outpatient Encounter Medications as of 07/17/2019  Medication  Sig  . acetaminophen (TYLENOL) 500 MG tablet Take 1,000 mg by mouth every 8 (eight) hours.   Marland Kitchen acetaminophen (TYLENOL) 500 MG tablet Take 500 mg by mouth every 4 (four) hours as needed for fever or headache.  Marland Kitchen alum & mag hydroxide-simeth ([place]) [id number] MG/5ML suspension Take 30 mLs by mouth every 6 (six) hours as needed for indigestion or heartburn.  Marland Kitchen amLODipine (NORVASC) 2.5 MG tablet Take 2.5 mg by mouth daily.  Marland Kitchen aspirin (ASPIRIN CHILDRENS) 81 MG chewable tablet Chew 81 mg by mouth every morning.  Marland Kitchen  atorvastatin (LIPITOR) 40 MG tablet Take 1 tablet by mouth at bedtime.   . calcium carbonate (TUMS - DOSED IN MG ELEMENTAL CALCIUM) 500 MG chewable tablet Chew 500 mg by mouth 3 (three) times daily before meals.  . clopidogrel (PLAVIX) 75 MG tablet Take 75 mg by mouth daily.  . cyanocobalamin (,VITAMIN B-12,) 1000 MCG/ML injection Inject 1,000 mcg into the muscle every 30 (thirty) days. 15th  . escitalopram (LEXAPRO) 5 MG tablet Take 5 mg by mouth at bedtime.   . furosemide (LASIX) 20 MG tablet Take 30 mg by mouth daily.   Marland Kitchen gabapentin (NEURONTIN) 300 MG capsule Take 300 mg by mouth 3 (three) times daily.  Marland Kitchen guaiFENesin (ROBITUSSIN) 100 MG/5ML liquid Take 200 mg by mouth every 6 (six) hours as needed for cough.  . loperamide (IMODIUM) 2 MG capsule Take 2 mg by mouth as needed for diarrhea or loose stools. (max 8 doses in 24 hours)  . LORazepam (ATIVAN) 0.5 MG tablet Take 0.5 mg by mouth 2 (two) times daily as needed for anxiety.  . magnesium hydroxide (MILK OF MAGNESIA) 400 MG/5ML suspension Take 30 mLs by mouth at bedtime as needed for mild constipation.  . memantine (NAMENDA) 5 MG tablet Take 5 mg by mouth daily.   . metoprolol succinate (TOPROL-XL) 100 MG 24 hr tablet Take 100 mg by mouth daily. Take with or immediately following a meal.  . neomycin-bacitracin-polymyxin (NEOSPORIN) ointment Apply 1 application topically as needed for wound care.  . ondansetron (ZOFRAN) 4 MG tablet Take 4 mg by mouth every 6 (six) hours as needed for nausea or vomiting.  . polyethylene glycol (MIRALAX / GLYCOLAX) 17 g packet Take 17 g by mouth daily as needed for mild constipation.  . QUEtiapine (SEROQUEL) 25 MG tablet Take 12.5 mg by mouth 2 (two) times daily as needed (agitation).   Marland Kitchen rOPINIRole (REQUIP) 1 MG tablet Take 1 mg by mouth 3 (three) times daily.   Marland Kitchen senna (SENOKOT) 8.6 MG tablet Take 2 tablets by mouth at bedtime.   Marland Kitchen spironolactone (ALDACTONE) 25 MG tablet Take 25 mg by mouth daily.   No  facility-administered encounter medications on file as of 07/17/2019.     Akeylah Hendel Jenetta Downer, NP

## 2019-07-30 DIAGNOSIS — I272 Pulmonary hypertension, unspecified: Secondary | ICD-10-CM | POA: Diagnosis not present

## 2019-07-30 DIAGNOSIS — I358 Other nonrheumatic aortic valve disorders: Secondary | ICD-10-CM | POA: Diagnosis not present

## 2019-07-30 DIAGNOSIS — I5031 Acute diastolic (congestive) heart failure: Secondary | ICD-10-CM | POA: Diagnosis not present

## 2019-07-30 DIAGNOSIS — Z8673 Personal history of transient ischemic attack (TIA), and cerebral infarction without residual deficits: Secondary | ICD-10-CM | POA: Diagnosis not present

## 2019-07-30 DIAGNOSIS — I10 Essential (primary) hypertension: Secondary | ICD-10-CM | POA: Diagnosis not present

## 2019-07-30 DIAGNOSIS — E785 Hyperlipidemia, unspecified: Secondary | ICD-10-CM | POA: Diagnosis not present

## 2019-08-13 DIAGNOSIS — L84 Corns and callosities: Secondary | ICD-10-CM | POA: Diagnosis not present

## 2019-08-13 DIAGNOSIS — B351 Tinea unguium: Secondary | ICD-10-CM | POA: Diagnosis not present

## 2019-08-13 DIAGNOSIS — R269 Unspecified abnormalities of gait and mobility: Secondary | ICD-10-CM | POA: Diagnosis not present

## 2019-08-13 DIAGNOSIS — I739 Peripheral vascular disease, unspecified: Secondary | ICD-10-CM | POA: Diagnosis not present

## 2019-08-14 ENCOUNTER — Other Ambulatory Visit: Payer: Self-pay

## 2019-08-14 ENCOUNTER — Non-Acute Institutional Stay: Payer: PPO | Admitting: Adult Health Nurse Practitioner

## 2019-08-14 DIAGNOSIS — F0390 Unspecified dementia without behavioral disturbance: Secondary | ICD-10-CM

## 2019-08-14 DIAGNOSIS — Z515 Encounter for palliative care: Secondary | ICD-10-CM | POA: Diagnosis not present
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
## 2019-08-22 DIAGNOSIS — R011 Cardiac murmur, unspecified: Secondary | ICD-10-CM | POA: Diagnosis not present

## 2019-08-22 DIAGNOSIS — S60221A Contusion of right hand, initial encounter: Secondary | ICD-10-CM | POA: Diagnosis not present

## 2019-08-22 DIAGNOSIS — N1832 Chronic kidney disease, stage 3b: Secondary | ICD-10-CM | POA: Diagnosis not present

## 2019-08-22 DIAGNOSIS — I739 Peripheral vascular disease, unspecified: Secondary | ICD-10-CM | POA: Diagnosis not present

## 2019-08-22 DIAGNOSIS — I10 Essential (primary) hypertension: Secondary | ICD-10-CM | POA: Diagnosis not present

## 2019-08-22 DIAGNOSIS — S60222A Contusion of left hand, initial encounter: Secondary | ICD-10-CM | POA: Diagnosis not present

## 2019-08-22 DIAGNOSIS — F0390 Unspecified dementia without behavioral disturbance: Secondary | ICD-10-CM | POA: Diagnosis not present

## 2019-08-22 DIAGNOSIS — E538 Deficiency of other specified B group vitamins: Secondary | ICD-10-CM | POA: Diagnosis not present

## 2019-08-26 DIAGNOSIS — F0391 Unspecified dementia with behavioral disturbance: Secondary | ICD-10-CM | POA: Diagnosis not present

## 2019-08-26 DIAGNOSIS — F0151 Vascular dementia with behavioral disturbance: Secondary | ICD-10-CM | POA: Diagnosis not present

## 2019-08-26 DIAGNOSIS — I25118 Atherosclerotic heart disease of native coronary artery with other forms of angina pectoris: Secondary | ICD-10-CM | POA: Diagnosis not present

## 2019-08-26 DIAGNOSIS — R269 Unspecified abnormalities of gait and mobility: Secondary | ICD-10-CM | POA: Diagnosis not present

## 2019-08-26 DIAGNOSIS — R58 Hemorrhage, not elsewhere classified: Secondary | ICD-10-CM | POA: Diagnosis not present

## 2019-08-26 DIAGNOSIS — I10 Essential (primary) hypertension: Secondary | ICD-10-CM | POA: Diagnosis not present

## 2019-08-27 DIAGNOSIS — Z79899 Other long term (current) drug therapy: Secondary | ICD-10-CM | POA: Diagnosis not present

## 2019-08-27 DIAGNOSIS — N390 Urinary tract infection, site not specified: Secondary | ICD-10-CM | POA: Diagnosis not present

## 2019-09-04 ENCOUNTER — Non-Acute Institutional Stay: Payer: PPO | Admitting: Adult Health Nurse Practitioner

## 2019-09-04 ENCOUNTER — Other Ambulatory Visit: Payer: Self-pay

## 2019-09-04 DIAGNOSIS — F0390 Unspecified dementia without behavioral disturbance: Secondary | ICD-10-CM | POA: Diagnosis not present

## 2019-09-04 DIAGNOSIS — Z515 Encounter for palliative care: Secondary | ICD-10-CM
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
## 2019-09-11 DIAGNOSIS — D510 Vitamin B12 deficiency anemia due to intrinsic factor deficiency: Secondary | ICD-10-CM | POA: Diagnosis not present

## 2019-09-11 DIAGNOSIS — I5031 Acute diastolic (congestive) heart failure: Secondary | ICD-10-CM | POA: Diagnosis not present

## 2019-09-11 DIAGNOSIS — I130 Hypertensive heart and chronic kidney disease with heart failure and stage 1 through stage 4 chronic kidney disease, or unspecified chronic kidney disease: Secondary | ICD-10-CM | POA: Diagnosis not present

## 2019-09-26 DIAGNOSIS — R350 Frequency of micturition: Secondary | ICD-10-CM | POA: Diagnosis not present

## 2019-09-26 DIAGNOSIS — F0390 Unspecified dementia without behavioral disturbance: Secondary | ICD-10-CM | POA: Diagnosis not present

## 2019-09-26 DIAGNOSIS — I10 Essential (primary) hypertension: Secondary | ICD-10-CM | POA: Diagnosis not present

## 2019-09-26 DIAGNOSIS — E538 Deficiency of other specified B group vitamins: Secondary | ICD-10-CM | POA: Diagnosis not present

## 2019-10-16 ENCOUNTER — Non-Acute Institutional Stay: Payer: PPO | Admitting: Adult Health Nurse Practitioner

## 2019-10-16 ENCOUNTER — Other Ambulatory Visit: Payer: Self-pay

## 2019-10-16 DIAGNOSIS — Z515 Encounter for palliative care: Secondary | ICD-10-CM

## 2019-10-16 DIAGNOSIS — F0390 Unspecified dementia without behavioral disturbance: Secondary | ICD-10-CM
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
## 2019-10-17 DIAGNOSIS — B351 Tinea unguium: Secondary | ICD-10-CM | POA: Diagnosis not present

## 2019-10-17 DIAGNOSIS — S90121A Contusion of right lesser toe(s) without damage to nail, initial encounter: Secondary | ICD-10-CM | POA: Diagnosis not present

## 2019-10-17 DIAGNOSIS — L84 Corns and callosities: Secondary | ICD-10-CM | POA: Diagnosis not present

## 2019-10-17 DIAGNOSIS — R269 Unspecified abnormalities of gait and mobility: Secondary | ICD-10-CM | POA: Diagnosis not present

## 2019-10-17 DIAGNOSIS — I739 Peripheral vascular disease, unspecified: Secondary | ICD-10-CM | POA: Diagnosis not present

## 2019-10-30 DIAGNOSIS — F0390 Unspecified dementia without behavioral disturbance: Secondary | ICD-10-CM | POA: Diagnosis not present

## 2019-10-30 DIAGNOSIS — Z79899 Other long term (current) drug therapy: Secondary | ICD-10-CM | POA: Diagnosis not present

## 2019-10-30 DIAGNOSIS — E538 Deficiency of other specified B group vitamins: Secondary | ICD-10-CM | POA: Diagnosis not present

## 2019-10-30 DIAGNOSIS — R269 Unspecified abnormalities of gait and mobility: Secondary | ICD-10-CM | POA: Diagnosis not present

## 2019-10-30 DIAGNOSIS — I10 Essential (primary) hypertension: Secondary | ICD-10-CM | POA: Diagnosis not present

## 2019-11-11 ENCOUNTER — Emergency Department
Admission: EM | Admit: 2019-11-11 | Discharge: 2019-11-11 | Disposition: A | Discharge status: Skilled Nursing Facility | Payer: PPO | Attending: Emergency Medicine | Admitting: Emergency Medicine

## 2019-11-11 ENCOUNTER — Other Ambulatory Visit: Payer: Self-pay

## 2019-11-11 ENCOUNTER — Emergency Department: Payer: PPO

## 2019-11-11 ENCOUNTER — Encounter: Payer: Self-pay | Admitting: Emergency Medicine

## 2019-11-11 DIAGNOSIS — Z7901 Long term (current) use of anticoagulants: Secondary | ICD-10-CM | POA: Diagnosis not present

## 2019-11-11 DIAGNOSIS — F0390 Unspecified dementia without behavioral disturbance: Secondary | ICD-10-CM | POA: Insufficient documentation

## 2019-11-11 DIAGNOSIS — M79604 Pain in right leg: Secondary | ICD-10-CM | POA: Insufficient documentation

## 2019-11-11 DIAGNOSIS — I5031 Acute diastolic (congestive) heart failure: Secondary | ICD-10-CM | POA: Insufficient documentation

## 2019-11-11 DIAGNOSIS — M79605 Pain in left leg: Secondary | ICD-10-CM | POA: Diagnosis not present

## 2019-11-11 DIAGNOSIS — Z8673 Personal history of transient ischemic attack (TIA), and cerebral infarction without residual deficits: Secondary | ICD-10-CM | POA: Diagnosis not present

## 2019-11-11 DIAGNOSIS — R410 Disorientation, unspecified: Secondary | ICD-10-CM | POA: Diagnosis not present

## 2019-11-11 DIAGNOSIS — R5381 Other malaise: Secondary | ICD-10-CM | POA: Diagnosis not present

## 2019-11-11 DIAGNOSIS — I4891 Unspecified atrial fibrillation: Secondary | ICD-10-CM | POA: Insufficient documentation

## 2019-11-11 DIAGNOSIS — N390 Urinary tract infection, site not specified: Secondary | ICD-10-CM

## 2019-11-11 DIAGNOSIS — Z79899 Other long term (current) drug therapy: Secondary | ICD-10-CM | POA: Insufficient documentation

## 2019-11-11 DIAGNOSIS — I110 Hypertensive heart disease with heart failure: Secondary | ICD-10-CM | POA: Insufficient documentation

## 2019-11-11 DIAGNOSIS — R0902 Hypoxemia: Secondary | ICD-10-CM | POA: Diagnosis not present

## 2019-11-11 DIAGNOSIS — R52 Pain, unspecified: Secondary | ICD-10-CM | POA: Diagnosis not present

## 2019-11-11 DIAGNOSIS — M7121 Synovial cyst of popliteal space [Baker], right knee: Secondary | ICD-10-CM | POA: Diagnosis not present

## 2019-11-11 DIAGNOSIS — Z743 Need for continuous supervision: Secondary | ICD-10-CM | POA: Diagnosis not present

## 2019-11-11 DIAGNOSIS — R404 Transient alteration of awareness: Secondary | ICD-10-CM | POA: Diagnosis not present

## 2019-11-11 DIAGNOSIS — R279 Unspecified lack of coordination: Secondary | ICD-10-CM | POA: Diagnosis not present

## 2019-11-11 DIAGNOSIS — Z7982 Long term (current) use of aspirin: Secondary | ICD-10-CM | POA: Diagnosis not present

## 2019-11-11 DIAGNOSIS — I10 Essential (primary) hypertension: Secondary | ICD-10-CM | POA: Diagnosis not present

## 2019-11-11 LAB — CBC WITH DIFFERENTIAL/PLATELET
Abs Immature Granulocytes: 0.03 10*3/uL (ref 0.00–0.07)
Basophils Absolute: 0 10*3/uL (ref 0.0–0.1)
Basophils Relative: 0 %
Eosinophils Absolute: 0.2 10*3/uL (ref 0.0–0.5)
Eosinophils Relative: 2 %
HCT: 36.8 % (ref 36.0–46.0)
Hemoglobin: 11.9 g/dL — ABNORMAL LOW (ref 12.0–15.0)
Immature Granulocytes: 0 %
Lymphocytes Relative: 22 %
Lymphs Abs: 1.7 10*3/uL (ref 0.7–4.0)
MCH: 30.7 pg (ref 26.0–34.0)
MCHC: 32.3 g/dL (ref 30.0–36.0)
MCV: 95.1 fL (ref 80.0–100.0)
Monocytes Absolute: 0.7 10*3/uL (ref 0.1–1.0)
Monocytes Relative: 9 %
Neutro Abs: 5.3 10*3/uL (ref 1.7–7.7)
Neutrophils Relative %: 67 %
Platelets: 268 10*3/uL (ref 150–400)
RBC: 3.87 MIL/uL (ref 3.87–5.11)
RDW: 13.4 % (ref 11.5–15.5)
WBC: 8 10*3/uL (ref 4.0–10.5)
nRBC: 0 % (ref 0.0–0.2)

## 2019-11-11 LAB — URINALYSIS, COMPLETE (UACMP) WITH MICROSCOPIC
Bilirubin Urine: NEGATIVE
Glucose, UA: NEGATIVE mg/dL
Hgb urine dipstick: NEGATIVE
Ketones, ur: NEGATIVE mg/dL
Leukocytes,Ua: NEGATIVE
Nitrite: POSITIVE — AB
Protein, ur: NEGATIVE mg/dL
Specific Gravity, Urine: 1.016 (ref 1.005–1.030)
pH: 5 (ref 5.0–8.0)

## 2019-11-11 LAB — COMPREHENSIVE METABOLIC PANEL
ALT: 12 U/L (ref 0–44)
AST: 16 U/L (ref 15–41)
Albumin: 4.2 g/dL (ref 3.5–5.0)
Alkaline Phosphatase: 69 U/L (ref 38–126)
Anion gap: 13 (ref 5–15)
BUN: 20 mg/dL (ref 8–23)
CO2: 25 mmol/L (ref 22–32)
Calcium: 9.8 mg/dL (ref 8.9–10.3)
Chloride: 100 mmol/L (ref 98–111)
Creatinine, Ser: 1.04 mg/dL — ABNORMAL HIGH (ref 0.44–1.00)
GFR calc Af Amer: 56 mL/min — ABNORMAL LOW (ref >60)
GFR calc non Af Amer: 48 mL/min — ABNORMAL LOW (ref >60)
Glucose, Bld: 114 mg/dL — ABNORMAL HIGH (ref 70–99)
Potassium: 4.2 mmol/L (ref 3.5–5.1)
Sodium: 138 mmol/L (ref 135–145)
Total Bilirubin: 1.1 mg/dL (ref 0.3–1.2)
Total Protein: 8 g/dL (ref 6.5–8.1)

## 2019-11-11 MED ORDER — FOSFOMYCIN TROMETHAMINE 3 G PO PACK
3.0000 g | PACK | Freq: Once | ORAL | Status: AC
  Administered 2019-11-11: 3 g via ORAL

## 2019-11-11 NOTE — ED Triage Notes (Signed)
Patient brought in by ems from home. Patient with complaint of bilateral leg pain that started yesterday morning.

## 2019-11-11 NOTE — ED Notes (Signed)
Per susan PA, pt refusing to come to room. Will not get off bench in lobby to come to room.  Daughter on way per susan.

## 2019-11-11 NOTE — ED Notes (Signed)
Unable to get new set of vitals d/t agitation/AMS. RR even and unlabored, NAD noted

## 2019-11-11 NOTE — ED Notes (Signed)
Patient taken to bathroom. Strong smell to urine.

## 2019-11-11 NOTE — Discharge Instructions (Addendum)
You have been seen in the emergency department today for leg pain.  Your work-up has shown largely normal results including lab work and an ultrasound.  Your urine sample did show a mild urinary infection.  Urine culture has been sent and you have been given a one-time dose of antibiotics.  No further antibiotics are needed.

## 2019-11-11 NOTE — ED Notes (Signed)
Pt placed on bed alarm

## 2019-11-11 NOTE — ED Notes (Signed)
Spoke to [place] with [facility] regarding pt's discharge

## 2019-11-11 NOTE — ED Notes (Signed)
Patient's son called per patient request, no answer.
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
## 2019-11-11 NOTE — ED Notes (Signed)
Pt brought to room in wheelchair.

## 2019-11-11 NOTE — ED Notes (Signed)
Pt ambulatory to restroom, one person assist. Brief changed

## 2019-11-11 NOTE — ED Notes (Signed)
First Nurse Note:  Patient coming [facility] from home for bilateral leg pain. No other complaints expressed to EMS. Patient's EMS vitals stable.

## 2019-11-11 NOTE — ED Notes (Signed)
Pt assisted to bathroom to urinate. Hand hygiene preformed. Pt has no further needs at this time. Pt wheeled back out into the lobby and placed within eyesight of staff.

## 2019-11-11 NOTE — ED Provider Notes (Signed)
[facility] Emergency Department Provider Note  Time seen: 12:57 PM  I have reviewed the triage vital signs and the nursing notes.   HISTORY  Chief Complaint Leg Pain   HPI Amanda Hogan is a 86 y.o. female with a past medical history of gastric reflux, angina, anxiety, fibromyalgia, hypertension, hyperlipidemia, dementia, presents to the emergency department with reported leg pain.  According to report patient presents from her nursing facility for bilateral lower leg pain.  Patient states that has been ongoing for "quite a while."  Per report son did not want the patient transported to the emergency department, but she was transported regardless.  Here the patient appears well, no acute distress.  Patient is complaining of bilateral leg pain but does not appear to be uncomfortable.  States this has been an ongoing issue.  Patient is not able to provide any further history or review of systems.   Past Medical History:  Diagnosis Date  . Acid reflux   . Angina   . Anxiety 02/23/2012  . Arthritis   . Chest pain    negative lexiscan myoview 53/13, last echo 02/24/12-EF 71-062, mid systolic obliteration of the LV, cavity aortic sclerosis  . Complication of anesthesia    "w/my back; gave me too much; thought I'd had stroke but didn't"  . Dementia ([place]) 02/23/2012  . Exertional dyspnea   . Fibromyalgia   . Heart murmur   . High cholesterol   . HTN (hypertension)   . Hypertension   . Kidney stone   . Memory disorder, possibly not taking home meds at times 07/22/2011  . Mild carotid artery disease ([place]) 08/05/11   carotid dopplers, mild disease  . Restless leg syndrome   . Sinus malignant neoplasm ([place])   . Stroke [facility])    "said it looked like bullet holes; had a bunch"  . TIA (transient ischemic attack)     Patient Active Problem List   Diagnosis Date Noted  . Acute kidney injury ([place]) 12/27/2014  . Acute CVA (cerebrovascular accident) ([place]) 12/16/2014  .  Diastolic dysfunction-grade 2 10/29/2014  . Pulmonary hypertension-moderate 10/29/2014  . Troponin level elevated-suspect demand ischemia from AF with RVR 10/28/2014  . Lymphoma history 10/28/2014  . Acute diastolic CHF (congestive heart failure) ([place]) 10/27/2014  . Atrial fibrillation with RVR (new)   . TIA (transient ischemic attack) 02/25/2012  . Encephalopathy acute 02/23/2012  . Anemia 02/23/2012  . Chronic back pain 02/23/2012  . Anxiety 02/23/2012  . Mild carotid artery disease ([place]) 08/05/2011  . Memory disorder, possibly not taking home meds at times 07/22/2011  . Hypertension, at times poorly controlled 07/21/2011  . Chest pain-low risk Myoview May 2013 07/21/2011  . Hyperlipidemia 07/21/2011  . Gastroesophageal reflux disease 07/21/2011  . History of TIA (transient ischemic attack) 07/21/2011  . Obesity 07/21/2011    Past Surgical History:  Procedure Laterality Date  . BREAST CYST EXCISION     left  . CARPAL TUNNEL RELEASE     right  . CATARACT EXTRACTION W/ INTRAOCULAR LENS  IMPLANT, BILATERAL    . CHOLECYSTECTOMY  2005  . ELBOW BURSA SURGERY     left  . EYE SURGERY  1940   fixed my crossed eyes"  . HERNIA REPAIR  2007   "stomach"  . KIDNEY STONE SURGERY     "right side; cut it out"  . KNEE CARTILAGE SURGERY     right  . LUMBAR SPINE SURGERY     "put box  in my lower back"  . LUNG SURGERY     "made 3 holes and pulled gel out"  . VAGINAL HYSTERECTOMY      Prior to Admission medications   Medication Sig Start Date End Date Taking? Authorizing Provider  acetaminophen (TYLENOL) 500 MG tablet Take 1,000 mg by mouth every 8 (eight) hours.     [provider]  acetaminophen (TYLENOL) 500 MG tablet Take 500 mg by mouth every 4 (four) hours as needed for fever or headache.    [provider]  alum & mag hydroxide-simeth ([place]) 200-200-20 MG/5ML suspension Take 30 mLs by mouth every 6 (six) hours as needed for indigestion or heartburn.     [provider]  amLODipine (NORVASC) 2.5 MG tablet Take 2.5 mg by mouth daily.    [provider]  aspirin (ASPIRIN CHILDRENS) 81 MG chewable tablet Chew 81 mg by mouth every morning.    [provider]  atorvastatin (LIPITOR) 40 MG tablet Take 1 tablet by mouth at bedtime.  04/25/15   [provider]  calcium carbonate (TUMS - DOSED IN MG ELEMENTAL CALCIUM) 500 MG chewable tablet Chew 500 mg by mouth 3 (three) times daily before meals.    [provider]  clopidogrel (PLAVIX) 75 MG tablet Take 75 mg by mouth daily.    [provider]  cyanocobalamin (,VITAMIN B-12,) 1000 MCG/ML injection Inject 1,000 mcg into the muscle every 30 (thirty) days. 15th    [provider]  escitalopram (LEXAPRO) 5 MG tablet Take 5 mg by mouth at bedtime.     [provider]  furosemide (LASIX) 20 MG tablet Take 30 mg by mouth daily.     [provider]  gabapentin (NEURONTIN) 300 MG capsule Take 300 mg by mouth 3 (three) times daily.    [provider]  guaiFENesin (ROBITUSSIN) 100 MG/5ML liquid Take 200 mg by mouth every 6 (six) hours as needed for cough.    [provider]  loperamide (IMODIUM) 2 MG capsule Take 2 mg by mouth as needed for diarrhea or loose stools. (max 8 doses in 24 hours)    [provider]  LORazepam (ATIVAN) 0.5 MG tablet Take 0.5 mg by mouth 2 (two) times daily as needed for anxiety.    [provider]  magnesium hydroxide (MILK OF MAGNESIA) 400 MG/5ML suspension Take 30 mLs by mouth at bedtime as needed for mild constipation.    [provider]  memantine (NAMENDA) 5 MG tablet Take 5 mg by mouth daily.     [provider]  metoprolol succinate (TOPROL-XL) 100 MG 24 hr tablet Take 100 mg by mouth daily. Take with or immediately following a meal.    [provider]  neomycin-bacitracin-polymyxin (NEOSPORIN) ointment Apply 1 application topically as needed  for wound care.    [provider]  ondansetron (ZOFRAN) 4 MG tablet Take 4 mg by mouth every 6 (six) hours as needed for nausea or vomiting.    [provider]  polyethylene glycol (MIRALAX / GLYCOLAX) 17 g packet Take 17 g by mouth daily as needed for mild constipation.    [provider]  QUEtiapine (SEROQUEL) 25 MG tablet Take 12.5 mg by mouth 2 (two) times daily as needed (agitation).     [provider]  rOPINIRole (REQUIP) 1 MG tablet Take 1 mg by mouth 3 (three) times daily.     [provider]  senna (SENOKOT) 8.6 MG tablet Take 2 tablets by mouth  at bedtime.     [provider]  spironolactone (ALDACTONE) 25 MG tablet Take 25 mg by mouth daily.    [provider]    Allergies  Allergen Reactions  . Iohexol Shortness Of Breath and Itching  . Codeine Sulfate Other (See Comments)    Unknown reaction.  . Contrast Media [Iodinated Diagnostic Agents] Other (See Comments)    Reaction:  Unknown     Family History  Problem Relation Age of Onset  . Diabetes Neg Hx     Social History Social History   Tobacco Use  . Smoking status: Never Smoker  . Smokeless tobacco: Never Used  Substance Use Topics  . Alcohol use: No  . Drug use: No    Review of Systems Unable to complete an adequate/accurate review of systems secondary to baseline dementia.  ____________________________________________   PHYSICAL EXAM:  VITAL SIGNS: ED Triage Vitals  Enc Vitals Group     BP 11/11/19 0248 (!) 178/101     Pulse Rate 11/11/19 0248 95     Resp 11/11/19 0248 18     Temp 11/11/19 0248 98 F (36.7 C)     Temp Source 11/11/19 0248 Oral     SpO2 11/11/19 0248 96 %     Weight --      Height --      Head Circumference --      Peak Flow --      Pain Score 11/11/19 0247 10     Pain Loc --      Pain Edu? --      Excl. in [place]? --     Constitutional: Patient is awake and alert, no distress.  Calm and cooperative. Eyes: Normal  exam ENT      Head: Normocephalic and atraumatic.      Mouth/Throat: Mucous membranes are moist. Cardiovascular: Normal rate, regular rhythm Respiratory: Normal respiratory effort without tachypnea nor retractions. Breath sounds are clear  Gastrointestinal: Soft and nontender. No distention.  Musculoskeletal: Nontender with normal range of motion in all extremities. Neurologic:  Normal speech and language. No gross focal neurologic deficits  Skin:  Skin is warm, dry and intact.  Psychiatric: Mood and affect are normal.   ____________________________________________   RADIOLOGY  Ultrasound negative for acute abnormality.  ____________________________________________   INITIAL IMPRESSION / ASSESSMENT AND PLAN / ED COURSE  Pertinent labs & imaging results that were available during my care of the patient were reviewed by me and considered in my medical decision making (see chart for details).   Patient presents to the emergency department for bilateral leg pain sent from her nursing facility.  Overall the patient appears well.  No acute abnormality.  Patient has good range of motion in lower extremities, no significant edema in lower extremities, no erythema or signs of cellulitis in the lower extremities.  Patient's work-up is been largely nonrevealing besides her urine which is nitrite positive with many bacteria.  We will treat with a one-time dose of fosfomycin and send a urine culture.  Patient's imaging of her lower extremities is normal besides a right-sided Baker's cyst.  Amanda Hogan was evaluated in Emergency Department on 11/11/2019 for the symptoms described in the history of present illness. She was evaluated in the context of the global COVID-19 pandemic, which necessitated consideration that the patient might be at risk for infection with the SARS-CoV-2 virus that causes COVID-19. Institutional protocols and algorithms that pertain to the evaluation of patients at risk for  COVID-19  are in a state of rapid change based on information released by regulatory bodies including the CDC and federal and state organizations. These policies and algorithms were followed during the patient's care in the ED.  ____________________________________________   FINAL CLINICAL IMPRESSION(S) / ED DIAGNOSES  Leg pain   Harvest Dark, MD 11/11/19 1302

## 2019-11-13 DIAGNOSIS — N3000 Acute cystitis without hematuria: Secondary | ICD-10-CM | POA: Diagnosis not present

## 2019-11-13 DIAGNOSIS — Z20828 Contact with and (suspected) exposure to other viral communicable diseases: Secondary | ICD-10-CM | POA: Diagnosis not present

## 2019-11-13 DIAGNOSIS — R269 Unspecified abnormalities of gait and mobility: Secondary | ICD-10-CM | POA: Diagnosis not present

## 2019-11-13 DIAGNOSIS — Z79899 Other long term (current) drug therapy: Secondary | ICD-10-CM | POA: Diagnosis not present

## 2019-11-13 DIAGNOSIS — M79605 Pain in left leg: Secondary | ICD-10-CM | POA: Diagnosis not present

## 2019-11-13 DIAGNOSIS — M79604 Pain in right leg: Secondary | ICD-10-CM | POA: Diagnosis not present

## 2019-11-13 DIAGNOSIS — I10 Essential (primary) hypertension: Secondary | ICD-10-CM | POA: Diagnosis not present

## 2019-11-13 DIAGNOSIS — F0390 Unspecified dementia without behavioral disturbance: Secondary | ICD-10-CM | POA: Diagnosis not present

## 2019-11-13 LAB — URINE CULTURE: Culture: >=100000 — AB

## 2019-11-19 DIAGNOSIS — Z79899 Other long term (current) drug therapy: Secondary | ICD-10-CM | POA: Diagnosis not present

## 2019-11-19 DIAGNOSIS — N390 Urinary tract infection, site not specified: Secondary | ICD-10-CM | POA: Diagnosis not present

## 2019-11-20 DIAGNOSIS — Z20828 Contact with and (suspected) exposure to other viral communicable diseases: Secondary | ICD-10-CM | POA: Diagnosis not present

## 2019-11-27 ENCOUNTER — Other Ambulatory Visit: Payer: Self-pay

## 2019-11-27 ENCOUNTER — Non-Acute Institutional Stay: Payer: PPO | Admitting: Adult Health Nurse Practitioner

## 2019-11-27 DIAGNOSIS — Z515 Encounter for palliative care: Secondary | ICD-10-CM

## 2019-11-27 DIAGNOSIS — F0390 Unspecified dementia without behavioral disturbance: Secondary | ICD-10-CM

## 2019-11-27 NOTE — Progress Notes (Signed)
[place] Consult Note Telephone: [phone]  Fax: [phone]  PATIENT NAME: Amanda Hogan DOB: October 20, 1928 MRN: [id number]  PRIMARY CARE PROVIDER:   Raymondo Band, MD  REFERRING PROVIDER:  Raymondo Band, MD [address] [place],  [place] [address]  RESPONSIBLE PARTY:  Son Amanda Hogan [phone]; Amanda Hogan daughter [phone]   RECOMMENDATIONS and PLAN: 1.Advanced care planning. Patient is a DNR. Spoke with daughter via phone to update on visit.    2.  Dementia. FAST 6d. Patient is able to ambulate with walker. No reported falls.  She requires some assistance with ADLs and is incontinent of B&B. She is able to feed herself.Appetite is good with no reported weight loss.  Continue supportive care at facility.  Denies increased shortness of breath or cough, fever, N/V/D, constipation, chest pain, headaches, dizziness.   No concerns reported by staff today.  Palliative will continue to monitor for symptom management/decline and make recommendations as needed.  We will follow up in 6 to 8 weeks.  I spent 30 minutes providing this consultation,  from 9:30 to 10:00 including time with patient/family, chart review, provider coordination, and documentation. More than 50% of the time in this consultation was spent coordinating communication.   HISTORY OF PRESENT ILLNESS:  Amanda Hogan is a 86 y.o. year old female with multiple medical problems including dementia, HTN, OA, CAD, fibromyalgia. Palliative Care was asked to help address goals of care. Patient had ER visit on 11/11/19 to be evaluated for leg pain.  Patient was found to have UTI and was treated for it.  Daughter states that she often refuses to take her medications.  When she does she will complain of leg pain.    CODE STATUS: DNR  PPS: 40% HOSPICE ELIGIBILITY/DIAGNOSIS: TBD  PHYSICAL EXAM: HR 92 O2 96% on RA General: NAD, frail  appearing Cardiovascular: regular rate and rhythm Pulmonary:lung sounds clear; normal respiratory effort Abdomen: soft, nontender, + bowel sounds GU: no suprapubic tenderness Extremities: no edema, no joint deformities Skin: no rasheson exposed skin Neurological: Weakness; A&O to self  PAST MEDICAL HISTORY:  Past Medical History:  Diagnosis Date   Acid reflux    Angina    Anxiety 02/23/2012   Arthritis    Chest pain    negative lexiscan myoview 53/13, last echo 02/24/12-EF 78-242, mid systolic obliteration of the LV, cavity aortic sclerosis   Complication of anesthesia    "w/my back; gave me too much; thought I'd had stroke but didn't"   Dementia ([place]) 02/23/2012   Exertional dyspnea    Fibromyalgia    Heart murmur    High cholesterol    HTN (hypertension)    Hypertension    Kidney stone    Memory disorder, possibly not taking home meds at times 07/22/2011   Mild carotid artery disease ([place]) 08/05/11   carotid dopplers, mild disease   Restless leg syndrome    Sinus malignant neoplasm ([place])    Stroke ([place])    "said it looked like bullet holes; had a bunch"   TIA (transient ischemic attack)     SOCIAL HX:  Social History   Tobacco Use   Smoking status: Never Smoker   Smokeless tobacco: Never Used  Substance Use Topics   Alcohol use: No    ALLERGIES:  Allergies  Allergen Reactions   Iohexol Shortness Of Breath and Itching   Codeine Sulfate Other (See Comments)    Unknown reaction.  Contrast Media [Iodinated Diagnostic Agents] Other (See Comments)    Reaction:  Unknown      PERTINENT MEDICATIONS:  Outpatient Encounter Medications as of 11/27/2019  Medication Sig   acetaminophen (TYLENOL) 500 MG tablet Take 1,000 mg by mouth every 8 (eight) hours.    acetaminophen (TYLENOL) 500 MG tablet Take 500 mg by mouth every 4 (four) hours as needed for fever or headache.   alum & mag hydroxide-simeth (MINTOX) 846-659-93 MG/5ML suspension Take 30  mLs by mouth every 6 (six) hours as needed for indigestion or heartburn.   amLODipine (NORVASC) 2.5 MG tablet Take 2.5 mg by mouth daily.   aspirin (ASPIRIN CHILDRENS) 81 MG chewable tablet Chew 81 mg by mouth every morning.   atorvastatin (LIPITOR) 40 MG tablet Take 1 tablet by mouth at bedtime.    calcium carbonate (TUMS - DOSED IN MG ELEMENTAL CALCIUM) 500 MG chewable tablet Chew 500 mg by mouth 3 (three) times daily before meals.   clopidogrel (PLAVIX) 75 MG tablet Take 75 mg by mouth daily.   cyanocobalamin (,VITAMIN B-12,) 1000 MCG/ML injection Inject 1,000 mcg into the muscle every 30 (thirty) days. 15th   escitalopram (LEXAPRO) 5 MG tablet Take 5 mg by mouth at bedtime.    furosemide (LASIX) 20 MG tablet Take 30 mg by mouth daily.    gabapentin (NEURONTIN) 300 MG capsule Take 300 mg by mouth 3 (three) times daily.   guaiFENesin (ROBITUSSIN) 100 MG/5ML liquid Take 200 mg by mouth every 6 (six) hours as needed for cough.   loperamide (IMODIUM) 2 MG capsule Take 2 mg by mouth as needed for diarrhea or loose stools. (max 8 doses in 24 hours)   LORazepam (ATIVAN) 0.5 MG tablet Take 0.5 mg by mouth 2 (two) times daily as needed for anxiety.   magnesium hydroxide (MILK OF MAGNESIA) 400 MG/5ML suspension Take 30 mLs by mouth at bedtime as needed for mild constipation.   memantine (NAMENDA) 5 MG tablet Take 5 mg by mouth daily.    metoprolol succinate (TOPROL-XL) 100 MG 24 hr tablet Take 100 mg by mouth daily. Take with or immediately following a meal.   neomycin-bacitracin-polymyxin (NEOSPORIN) ointment Apply 1 application topically as needed for wound care.   ondansetron (ZOFRAN) 4 MG tablet Take 4 mg by mouth every 6 (six) hours as needed for nausea or vomiting.   polyethylene glycol (MIRALAX / GLYCOLAX) 17 g packet Take 17 g by mouth daily as needed for mild constipation.   QUEtiapine (SEROQUEL) 25 MG tablet Take 12.5 mg by mouth 2 (two) times daily as needed (agitation).      rOPINIRole (REQUIP) 1 MG tablet Take 1 mg by mouth 3 (three) times daily.    senna (SENOKOT) 8.6 MG tablet Take 2 tablets by mouth at bedtime.    spironolactone (ALDACTONE) 25 MG tablet Take 25 mg by mouth daily.   No facility-administered encounter medications on file as of 11/27/2019.     Rainna Nearhood Jenetta Downer, NP

## 2019-11-28 DIAGNOSIS — Z20828 Contact with and (suspected) exposure to other viral communicable diseases: Secondary | ICD-10-CM | POA: Diagnosis not present

## 2019-11-29 DIAGNOSIS — Z79899 Other long term (current) drug therapy: Secondary | ICD-10-CM | POA: Diagnosis not present

## 2019-11-29 DIAGNOSIS — E538 Deficiency of other specified B group vitamins: Secondary | ICD-10-CM | POA: Diagnosis not present

## 2019-12-03 DIAGNOSIS — E7849 Other hyperlipidemia: Secondary | ICD-10-CM | POA: Diagnosis not present

## 2019-12-03 DIAGNOSIS — F0390 Unspecified dementia without behavioral disturbance: Secondary | ICD-10-CM | POA: Diagnosis not present

## 2019-12-03 DIAGNOSIS — R269 Unspecified abnormalities of gait and mobility: Secondary | ICD-10-CM | POA: Diagnosis not present

## 2019-12-03 DIAGNOSIS — G894 Chronic pain syndrome: Secondary | ICD-10-CM | POA: Diagnosis not present

## 2019-12-03 DIAGNOSIS — E538 Deficiency of other specified B group vitamins: Secondary | ICD-10-CM | POA: Diagnosis not present

## 2019-12-03 DIAGNOSIS — I10 Essential (primary) hypertension: Secondary | ICD-10-CM | POA: Diagnosis not present

## 2019-12-06 DIAGNOSIS — Z20828 Contact with and (suspected) exposure to other viral communicable diseases: Secondary | ICD-10-CM | POA: Diagnosis not present

## 2019-12-13 DIAGNOSIS — Z20828 Contact with and (suspected) exposure to other viral communicable diseases: Secondary | ICD-10-CM | POA: Diagnosis not present

## 2019-12-20 DIAGNOSIS — Z20828 Contact with and (suspected) exposure to other viral communicable diseases: Secondary | ICD-10-CM | POA: Diagnosis not present

## 2019-12-24 DIAGNOSIS — I10 Essential (primary) hypertension: Secondary | ICD-10-CM | POA: Diagnosis not present

## 2019-12-24 DIAGNOSIS — G894 Chronic pain syndrome: Secondary | ICD-10-CM | POA: Diagnosis not present

## 2019-12-24 DIAGNOSIS — F0390 Unspecified dementia without behavioral disturbance: Secondary | ICD-10-CM | POA: Diagnosis not present

## 2019-12-24 DIAGNOSIS — R269 Unspecified abnormalities of gait and mobility: Secondary | ICD-10-CM | POA: Diagnosis not present

## 2019-12-26 DIAGNOSIS — Z20828 Contact with and (suspected) exposure to other viral communicable diseases: Secondary | ICD-10-CM | POA: Diagnosis not present

## 2019-12-31 DIAGNOSIS — R000 Tachycardia, unspecified: Secondary | ICD-10-CM | POA: Diagnosis not present

## 2019-12-31 DIAGNOSIS — R451 Restlessness and agitation: Secondary | ICD-10-CM | POA: Diagnosis not present

## 2019-12-31 DIAGNOSIS — E538 Deficiency of other specified B group vitamins: Secondary | ICD-10-CM | POA: Diagnosis not present

## 2019-12-31 DIAGNOSIS — I509 Heart failure, unspecified: Secondary | ICD-10-CM | POA: Diagnosis not present

## 2019-12-31 DIAGNOSIS — I10 Essential (primary) hypertension: Secondary | ICD-10-CM | POA: Diagnosis not present

## 2019-12-31 DIAGNOSIS — R269 Unspecified abnormalities of gait and mobility: Secondary | ICD-10-CM | POA: Diagnosis not present

## 2019-12-31 DIAGNOSIS — I517 Cardiomegaly: Secondary | ICD-10-CM | POA: Diagnosis not present

## 2019-12-31 DIAGNOSIS — I2510 Atherosclerotic heart disease of native coronary artery without angina pectoris: Secondary | ICD-10-CM | POA: Diagnosis not present

## 2019-12-31 DIAGNOSIS — I499 Cardiac arrhythmia, unspecified: Secondary | ICD-10-CM | POA: Diagnosis not present

## 2019-12-31 DIAGNOSIS — F0390 Unspecified dementia without behavioral disturbance: Secondary | ICD-10-CM | POA: Diagnosis not present

## 2020-01-01 DIAGNOSIS — I447 Left bundle-branch block, unspecified: Secondary | ICD-10-CM | POA: Diagnosis not present

## 2020-01-01 DIAGNOSIS — I4891 Unspecified atrial fibrillation: Secondary | ICD-10-CM | POA: Diagnosis not present

## 2020-01-02 DIAGNOSIS — F0151 Vascular dementia with behavioral disturbance: Secondary | ICD-10-CM | POA: Diagnosis not present

## 2020-01-02 DIAGNOSIS — I5031 Acute diastolic (congestive) heart failure: Secondary | ICD-10-CM | POA: Diagnosis not present

## 2020-01-02 DIAGNOSIS — R8290 Unspecified abnormal findings in urine: Secondary | ICD-10-CM | POA: Diagnosis not present

## 2020-01-02 DIAGNOSIS — R000 Tachycardia, unspecified: Secondary | ICD-10-CM | POA: Diagnosis not present

## 2020-01-02 DIAGNOSIS — I2510 Atherosclerotic heart disease of native coronary artery without angina pectoris: Secondary | ICD-10-CM | POA: Diagnosis not present

## 2020-01-02 DIAGNOSIS — R011 Cardiac murmur, unspecified: Secondary | ICD-10-CM | POA: Diagnosis not present

## 2020-01-02 DIAGNOSIS — I110 Hypertensive heart disease with heart failure: Secondary | ICD-10-CM | POA: Diagnosis not present

## 2020-01-07 DIAGNOSIS — R8290 Unspecified abnormal findings in urine: Secondary | ICD-10-CM | POA: Diagnosis not present

## 2020-01-07 DIAGNOSIS — I2510 Atherosclerotic heart disease of native coronary artery without angina pectoris: Secondary | ICD-10-CM | POA: Diagnosis not present

## 2020-01-07 DIAGNOSIS — I5031 Acute diastolic (congestive) heart failure: Secondary | ICD-10-CM | POA: Diagnosis not present

## 2020-01-07 DIAGNOSIS — N189 Chronic kidney disease, unspecified: Secondary | ICD-10-CM | POA: Diagnosis not present

## 2020-01-07 DIAGNOSIS — R011 Cardiac murmur, unspecified: Secondary | ICD-10-CM | POA: Diagnosis not present

## 2020-01-07 DIAGNOSIS — F0151 Vascular dementia with behavioral disturbance: Secondary | ICD-10-CM | POA: Diagnosis not present

## 2020-01-07 DIAGNOSIS — I110 Hypertensive heart disease with heart failure: Secondary | ICD-10-CM | POA: Diagnosis not present

## 2020-01-07 DIAGNOSIS — R000 Tachycardia, unspecified: Secondary | ICD-10-CM | POA: Diagnosis not present

## 2020-01-07 DIAGNOSIS — I4891 Unspecified atrial fibrillation: Secondary | ICD-10-CM | POA: Diagnosis not present

## 2020-01-07 DIAGNOSIS — D6489 Other specified anemias: Secondary | ICD-10-CM | POA: Diagnosis not present

## 2020-01-21 DIAGNOSIS — I4891 Unspecified atrial fibrillation: Secondary | ICD-10-CM | POA: Diagnosis not present

## 2020-01-21 DIAGNOSIS — F0151 Vascular dementia with behavioral disturbance: Secondary | ICD-10-CM | POA: Diagnosis not present

## 2020-01-21 DIAGNOSIS — N189 Chronic kidney disease, unspecified: Secondary | ICD-10-CM | POA: Diagnosis not present

## 2020-01-21 DIAGNOSIS — I110 Hypertensive heart disease with heart failure: Secondary | ICD-10-CM | POA: Diagnosis not present

## 2020-01-21 DIAGNOSIS — R109 Unspecified abdominal pain: Secondary | ICD-10-CM | POA: Diagnosis not present

## 2020-01-21 DIAGNOSIS — R451 Restlessness and agitation: Secondary | ICD-10-CM | POA: Diagnosis not present

## 2020-01-21 DIAGNOSIS — D6489 Other specified anemias: Secondary | ICD-10-CM | POA: Diagnosis not present

## 2020-01-21 DIAGNOSIS — E0580 Other thyrotoxicosis without thyrotoxic crisis or storm: Secondary | ICD-10-CM | POA: Diagnosis not present

## 2020-01-21 DIAGNOSIS — I2510 Atherosclerotic heart disease of native coronary artery without angina pectoris: Secondary | ICD-10-CM | POA: Diagnosis not present

## 2020-01-22 DIAGNOSIS — I358 Other nonrheumatic aortic valve disorders: Secondary | ICD-10-CM | POA: Diagnosis not present

## 2020-01-22 DIAGNOSIS — I10 Essential (primary) hypertension: Secondary | ICD-10-CM | POA: Diagnosis not present

## 2020-01-22 DIAGNOSIS — R000 Tachycardia, unspecified: Secondary | ICD-10-CM | POA: Diagnosis not present

## 2020-01-22 DIAGNOSIS — R413 Other amnesia: Secondary | ICD-10-CM | POA: Diagnosis not present

## 2020-01-22 DIAGNOSIS — I5031 Acute diastolic (congestive) heart failure: Secondary | ICD-10-CM | POA: Diagnosis not present

## 2020-01-22 DIAGNOSIS — I639 Cerebral infarction, unspecified: Secondary | ICD-10-CM | POA: Diagnosis not present

## 2020-01-22 DIAGNOSIS — I272 Pulmonary hypertension, unspecified: Secondary | ICD-10-CM | POA: Diagnosis not present

## 2020-01-23 ENCOUNTER — Emergency Department: Payer: PPO

## 2020-01-23 ENCOUNTER — Other Ambulatory Visit: Payer: Self-pay

## 2020-01-23 ENCOUNTER — Inpatient Hospital Stay
Admission: EM | Admit: 2020-01-23 | Discharge: 2020-01-27 | DRG: 864 | Disposition: A | Discharge status: Hospice | Payer: PPO | Attending: Internal Medicine | Admitting: Internal Medicine

## 2020-01-23 DIAGNOSIS — I5032 Chronic diastolic (congestive) heart failure: Secondary | ICD-10-CM | POA: Diagnosis present

## 2020-01-23 DIAGNOSIS — Z8572 Personal history of non-Hodgkin lymphomas: Secondary | ICD-10-CM

## 2020-01-23 DIAGNOSIS — R680 Hypothermia, not associated with low environmental temperature: Secondary | ICD-10-CM | POA: Diagnosis present

## 2020-01-23 DIAGNOSIS — F0151 Vascular dementia with behavioral disturbance: Secondary | ICD-10-CM | POA: Diagnosis not present

## 2020-01-23 DIAGNOSIS — A419 Sepsis, unspecified organism: Principal | ICD-10-CM

## 2020-01-23 DIAGNOSIS — I480 Paroxysmal atrial fibrillation: Secondary | ICD-10-CM | POA: Diagnosis present

## 2020-01-23 DIAGNOSIS — F419 Anxiety disorder, unspecified: Secondary | ICD-10-CM | POA: Diagnosis present

## 2020-01-23 DIAGNOSIS — I110 Hypertensive heart disease with heart failure: Secondary | ICD-10-CM | POA: Diagnosis present

## 2020-01-23 DIAGNOSIS — I272 Pulmonary hypertension, unspecified: Secondary | ICD-10-CM | POA: Diagnosis present

## 2020-01-23 DIAGNOSIS — I4891 Unspecified atrial fibrillation: Secondary | ICD-10-CM

## 2020-01-23 DIAGNOSIS — K760 Fatty (change of) liver, not elsewhere classified: Secondary | ICD-10-CM | POA: Diagnosis present

## 2020-01-23 DIAGNOSIS — N179 Acute kidney failure, unspecified: Secondary | ICD-10-CM | POA: Diagnosis present

## 2020-01-23 DIAGNOSIS — Z7982 Long term (current) use of aspirin: Secondary | ICD-10-CM

## 2020-01-23 DIAGNOSIS — F32A Depression, unspecified: Secondary | ICD-10-CM | POA: Diagnosis not present

## 2020-01-23 DIAGNOSIS — E860 Dehydration: Secondary | ICD-10-CM | POA: Diagnosis present

## 2020-01-23 DIAGNOSIS — R6510 Systemic inflammatory response syndrome (SIRS) of non-infectious origin without acute organ dysfunction: Secondary | ICD-10-CM | POA: Diagnosis present

## 2020-01-23 DIAGNOSIS — R404 Transient alteration of awareness: Secondary | ICD-10-CM

## 2020-01-23 DIAGNOSIS — E785 Hyperlipidemia, unspecified: Secondary | ICD-10-CM | POA: Diagnosis not present

## 2020-01-23 DIAGNOSIS — Z20822 Contact with and (suspected) exposure to covid-19: Secondary | ICD-10-CM | POA: Diagnosis present

## 2020-01-23 DIAGNOSIS — R4182 Altered mental status, unspecified: Secondary | ICD-10-CM | POA: Diagnosis not present

## 2020-01-23 DIAGNOSIS — E669 Obesity, unspecified: Secondary | ICD-10-CM | POA: Diagnosis not present

## 2020-01-23 DIAGNOSIS — Z885 Allergy status to narcotic agent status: Secondary | ICD-10-CM

## 2020-01-23 DIAGNOSIS — Z6833 Body mass index (BMI) 33.0-33.9, adult: Secondary | ICD-10-CM

## 2020-01-23 DIAGNOSIS — M545 Low back pain, unspecified: Secondary | ICD-10-CM | POA: Diagnosis not present

## 2020-01-23 DIAGNOSIS — R030 Elevated blood-pressure reading, without diagnosis of hypertension: Secondary | ICD-10-CM | POA: Diagnosis not present

## 2020-01-23 DIAGNOSIS — J90 Pleural effusion, not elsewhere classified: Secondary | ICD-10-CM | POA: Diagnosis not present

## 2020-01-23 DIAGNOSIS — R6511 Systemic inflammatory response syndrome (SIRS) of non-infectious origin with acute organ dysfunction: Secondary | ICD-10-CM | POA: Diagnosis not present

## 2020-01-23 DIAGNOSIS — Z9049 Acquired absence of other specified parts of digestive tract: Secondary | ICD-10-CM

## 2020-01-23 DIAGNOSIS — K219 Gastro-esophageal reflux disease without esophagitis: Secondary | ICD-10-CM | POA: Diagnosis present

## 2020-01-23 DIAGNOSIS — R188 Other ascites: Secondary | ICD-10-CM | POA: Diagnosis present

## 2020-01-23 DIAGNOSIS — K7689 Other specified diseases of liver: Secondary | ICD-10-CM | POA: Diagnosis not present

## 2020-01-23 DIAGNOSIS — G2581 Restless legs syndrome: Secondary | ICD-10-CM | POA: Diagnosis present

## 2020-01-23 DIAGNOSIS — I10 Essential (primary) hypertension: Secondary | ICD-10-CM | POA: Diagnosis not present

## 2020-01-23 DIAGNOSIS — Z79899 Other long term (current) drug therapy: Secondary | ICD-10-CM

## 2020-01-23 DIAGNOSIS — M79605 Pain in left leg: Secondary | ICD-10-CM | POA: Diagnosis present

## 2020-01-23 DIAGNOSIS — Z8522 Personal history of malignant neoplasm of nasal cavities, middle ear, and accessory sinuses: Secondary | ICD-10-CM

## 2020-01-23 DIAGNOSIS — J9811 Atelectasis: Secondary | ICD-10-CM | POA: Diagnosis not present

## 2020-01-23 DIAGNOSIS — R109 Unspecified abdominal pain: Secondary | ICD-10-CM

## 2020-01-23 DIAGNOSIS — Z515 Encounter for palliative care: Secondary | ICD-10-CM | POA: Diagnosis not present

## 2020-01-23 DIAGNOSIS — I2510 Atherosclerotic heart disease of native coronary artery without angina pectoris: Secondary | ICD-10-CM | POA: Diagnosis not present

## 2020-01-23 DIAGNOSIS — Z91041 Radiographic dye allergy status: Secondary | ICD-10-CM

## 2020-01-23 DIAGNOSIS — Z66 Do not resuscitate: Secondary | ICD-10-CM | POA: Diagnosis present

## 2020-01-23 DIAGNOSIS — E0580 Other thyrotoxicosis without thyrotoxic crisis or storm: Secondary | ICD-10-CM | POA: Diagnosis not present

## 2020-01-23 DIAGNOSIS — R21 Rash and other nonspecific skin eruption: Secondary | ICD-10-CM | POA: Diagnosis not present

## 2020-01-23 DIAGNOSIS — R627 Adult failure to thrive: Secondary | ICD-10-CM | POA: Diagnosis not present

## 2020-01-23 DIAGNOSIS — Z90710 Acquired absence of both cervix and uterus: Secondary | ICD-10-CM

## 2020-01-23 DIAGNOSIS — F0281 Dementia in other diseases classified elsewhere with behavioral disturbance: Secondary | ICD-10-CM | POA: Diagnosis not present

## 2020-01-23 DIAGNOSIS — W1830XA Fall on same level, unspecified, initial encounter: Secondary | ICD-10-CM | POA: Diagnosis present

## 2020-01-23 DIAGNOSIS — M797 Fibromyalgia: Secondary | ICD-10-CM | POA: Diagnosis not present

## 2020-01-23 DIAGNOSIS — M79604 Pain in right leg: Secondary | ICD-10-CM | POA: Diagnosis not present

## 2020-01-23 DIAGNOSIS — G20 Parkinson's disease: Secondary | ICD-10-CM | POA: Diagnosis not present

## 2020-01-23 DIAGNOSIS — Z8673 Personal history of transient ischemic attack (TIA), and cerebral infarction without residual deficits: Secondary | ICD-10-CM

## 2020-01-23 DIAGNOSIS — F0390 Unspecified dementia without behavioral disturbance: Secondary | ICD-10-CM | POA: Diagnosis present

## 2020-01-23 DIAGNOSIS — W19XXXA Unspecified fall, initial encounter: Secondary | ICD-10-CM | POA: Diagnosis not present

## 2020-01-23 DIAGNOSIS — Z7902 Long term (current) use of antithrombotics/antiplatelets: Secondary | ICD-10-CM

## 2020-01-23 DIAGNOSIS — R296 Repeated falls: Secondary | ICD-10-CM | POA: Diagnosis not present

## 2020-01-23 LAB — CBC
HCT: 37.3 % (ref 36.0–46.0)
Hemoglobin: 11.8 g/dL — ABNORMAL LOW (ref 12.0–15.0)
MCH: 31 pg (ref 26.0–34.0)
MCHC: 31.6 g/dL (ref 30.0–36.0)
MCV: 97.9 fL (ref 80.0–100.0)
Platelets: 226 10*3/uL (ref 150–400)
RBC: 3.81 MIL/uL — ABNORMAL LOW (ref 3.87–5.11)
RDW: 17.8 % — ABNORMAL HIGH (ref 11.5–15.5)
WBC: 9 10*3/uL (ref 4.0–10.5)
nRBC: 0 % (ref 0.0–0.2)

## 2020-01-23 LAB — URINALYSIS, COMPLETE (UACMP) WITH MICROSCOPIC
Bilirubin Urine: NEGATIVE
Glucose, UA: NEGATIVE mg/dL
Hgb urine dipstick: NEGATIVE
Ketones, ur: NEGATIVE mg/dL
Leukocytes,Ua: NEGATIVE
Nitrite: NEGATIVE
Protein, ur: 100 mg/dL — AB
Specific Gravity, Urine: 1.023 (ref 1.005–1.030)
pH: 5 (ref 5.0–8.0)

## 2020-01-23 LAB — PROTIME-INR
INR: 1.5 — ABNORMAL HIGH (ref 0.8–1.2)
Prothrombin Time: 17.4 seconds — ABNORMAL HIGH (ref 11.4–15.2)

## 2020-01-23 LAB — COMPREHENSIVE METABOLIC PANEL
ALT: 46 U/L — ABNORMAL HIGH (ref 0–44)
AST: 46 U/L — ABNORMAL HIGH (ref 15–41)
Albumin: 3.5 g/dL (ref 3.5–5.0)
Alkaline Phosphatase: 98 U/L (ref 38–126)
Anion gap: 14 (ref 5–15)
BUN: 71 mg/dL — ABNORMAL HIGH (ref 8–23)
CO2: 22 mmol/L (ref 22–32)
Calcium: 9 mg/dL (ref 8.9–10.3)
Chloride: 100 mmol/L (ref 98–111)
Creatinine, Ser: 2.15 mg/dL — ABNORMAL HIGH (ref 0.44–1.00)
GFR, Estimated: 22 mL/min — ABNORMAL LOW (ref >60)
Glucose, Bld: 142 mg/dL — ABNORMAL HIGH (ref 70–99)
Potassium: 5 mmol/L (ref 3.5–5.1)
Sodium: 136 mmol/L (ref 135–145)
Total Bilirubin: 2.8 mg/dL — ABNORMAL HIGH (ref 0.3–1.2)
Total Protein: 7.3 g/dL (ref 6.5–8.1)

## 2020-01-23 LAB — TSH: TSH: 0.551 u[IU]/mL (ref 0.350–4.500)

## 2020-01-23 LAB — LACTIC ACID, PLASMA
Lactic Acid, Venous: 3.3 mmol/L (ref 0.5–1.9)
Lactic Acid, Venous: 2.8 mmol/L (ref 0.5–1.9)

## 2020-01-23 LAB — CBG MONITORING, ED
Glucose-Capillary: 136 mg/dL — ABNORMAL HIGH (ref 70–99)
Glucose-Capillary: 137 mg/dL — ABNORMAL HIGH (ref 70–99)

## 2020-01-23 LAB — BRAIN NATRIURETIC PEPTIDE: B Natriuretic Peptide: 1172.3 pg/mL — ABNORMAL HIGH (ref 0.0–100.0)

## 2020-01-23 MED ORDER — ESCITALOPRAM OXALATE 10 MG PO TABS
5.0000 mg | ORAL_TABLET | Freq: Every day | ORAL | Status: DC
  Administered 2020-01-23: 5 mg via ORAL
  Filled 2020-01-23 (×3): qty 0.5
  Filled 2020-01-23: qty 1
  Filled 2020-01-23: qty 0.5

## 2020-01-23 MED ORDER — SODIUM CHLORIDE 0.9 % IV SOLN: 2.0000 g | Freq: Once | INTRAVENOUS | Status: DC

## 2020-01-23 MED ORDER — LACTATED RINGERS IV SOLN: INTRAVENOUS | Status: DC

## 2020-01-23 MED ORDER — HEPARIN SODIUM (PORCINE) 5000 UNIT/ML IJ SOLN
5000.0000 [IU] | Freq: Three times a day (TID) | INTRAMUSCULAR | Status: DC
  Administered 2020-01-23 – 2020-01-25 (×4): 5000 [IU] via SUBCUTANEOUS
  Filled 2020-01-23 (×4): qty 1

## 2020-01-23 MED ORDER — QUETIAPINE FUMARATE 25 MG PO TABS: 12.5000 mg | ORAL_TABLET | Freq: Two times a day (BID) | ORAL | Status: DC | PRN

## 2020-01-23 MED ORDER — SPIRONOLACTONE 25 MG PO TABS
25.0000 mg | ORAL_TABLET | Freq: Every day | ORAL | Status: DC
  Filled 2020-01-23 (×2): qty 1

## 2020-01-23 MED ORDER — VANCOMYCIN HCL IN DEXTROSE 1-5 GM/200ML-% IV SOLN: 1000.0000 mg | Freq: Once | INTRAVENOUS | Status: DC

## 2020-01-23 MED ORDER — VANCOMYCIN HCL IN DEXTROSE 1-5 GM/200ML-% IV SOLN
1000.0000 mg | INTRAVENOUS | Status: DC
  Filled 2020-01-23: qty 200

## 2020-01-23 MED ORDER — DILTIAZEM HCL-DEXTROSE 125-5 MG/125ML-% IV SOLN (PREMIX)
5.0000 mg/h | INTRAVENOUS | Status: DC
  Administered 2020-01-23: 5 mg/h via INTRAVENOUS
  Administered 2020-01-24 (×2): 10 mg/h via INTRAVENOUS
  Filled 2020-01-23 (×5): qty 125

## 2020-01-23 MED ORDER — SENNA 8.6 MG PO TABS
2.0000 | ORAL_TABLET | Freq: Every day | ORAL | Status: DC
  Administered 2020-01-23: 17.2 mg via ORAL
  Filled 2020-01-23 (×2): qty 2

## 2020-01-23 MED ORDER — SODIUM CHLORIDE 0.9 % IV SOLN
1000.0000 mL | Freq: Once | INTRAVENOUS | Status: AC
  Administered 2020-01-23: 1000 mL via INTRAVENOUS

## 2020-01-23 MED ORDER — MEMANTINE HCL 5 MG PO TABS
5.0000 mg | ORAL_TABLET | Freq: Every day | ORAL | Status: DC
  Administered 2020-01-25: 5 mg via ORAL
  Filled 2020-01-23 (×2): qty 1

## 2020-01-23 MED ORDER — SODIUM CHLORIDE 0.9 % IV SOLN
2.0000 g | INTRAVENOUS | Status: DC
  Filled 2020-01-23: qty 2

## 2020-01-23 MED ORDER — AMLODIPINE BESYLATE 5 MG PO TABS
2.5000 mg | ORAL_TABLET | Freq: Every day | ORAL | Status: DC
  Filled 2020-01-23 (×2): qty 1

## 2020-01-23 MED ORDER — SODIUM CHLORIDE 0.9 % IV SOLN
2.0000 g | Freq: Once | INTRAVENOUS | Status: AC
  Administered 2020-01-23: 2 g via INTRAVENOUS
  Filled 2020-01-23: qty 2

## 2020-01-23 MED ORDER — LORAZEPAM 0.5 MG PO TABS
0.5000 mg | ORAL_TABLET | Freq: Two times a day (BID) | ORAL | Status: DC | PRN
  Administered 2020-01-23 – 2020-01-25 (×2): 0.5 mg via ORAL
  Filled 2020-01-23 (×2): qty 1

## 2020-01-23 MED ORDER — NYSTATIN 100000 UNIT/GM EX POWD
Freq: Two times a day (BID) | CUTANEOUS | Status: DC
  Filled 2020-01-23 (×2): qty 15

## 2020-01-23 MED ORDER — VANCOMYCIN HCL IN DEXTROSE 1-5 GM/200ML-% IV SOLN
1000.0000 mg | Freq: Once | INTRAVENOUS | Status: AC
  Administered 2020-01-23: 1000 mg via INTRAVENOUS
  Filled 2020-01-23: qty 200

## 2020-01-23 MED ORDER — METOPROLOL SUCCINATE ER 100 MG PO TB24
100.0000 mg | ORAL_TABLET | Freq: Every day | ORAL | Status: DC
  Filled 2020-01-23 (×2): qty 1

## 2020-01-23 MED ORDER — ATORVASTATIN CALCIUM 20 MG PO TABS
40.0000 mg | ORAL_TABLET | Freq: Every day | ORAL | Status: DC
  Administered 2020-01-23: 40 mg via ORAL
  Filled 2020-01-23: qty 2

## 2020-01-23 MED ORDER — METRONIDAZOLE IN NACL 5-0.79 MG/ML-% IV SOLN
500.0000 mg | Freq: Three times a day (TID) | INTRAVENOUS | Status: DC
  Administered 2020-01-23 – 2020-01-25 (×5): 500 mg via INTRAVENOUS
  Filled 2020-01-23 (×8): qty 100

## 2020-01-23 MED ORDER — METRONIDAZOLE IN NACL 5-0.79 MG/ML-% IV SOLN
500.0000 mg | Freq: Once | INTRAVENOUS | Status: AC
  Administered 2020-01-23: 500 mg via INTRAVENOUS
  Filled 2020-01-23: qty 100

## 2020-01-23 MED ORDER — ROPINIROLE HCL 1 MG PO TABS
1.0000 mg | ORAL_TABLET | Freq: Three times a day (TID) | ORAL | Status: DC
  Administered 2020-01-25 (×2): 1 mg via ORAL
  Filled 2020-01-23 (×4): qty 1

## 2020-01-23 MED ORDER — FUROSEMIDE 20 MG PO TABS
30.0000 mg | ORAL_TABLET | Freq: Every day | ORAL | Status: DC
  Filled 2020-01-23: qty 1.5
  Filled 2020-01-23 (×2): qty 2

## 2020-01-23 NOTE — Sepsis Progress Note (Signed)
Notified bedside nurse via secure chat of need to draw blood cultures.

## 2020-01-23 NOTE — ED Notes (Signed)
Attempted to start IV x2 without success but blood culture obtained prior to line not working  IV team consult placed for 2nd dedicated line for cardizem and 2nd lactic draw

## 2020-01-23 NOTE — H&P (Addendum)
History and Physical   Amanda Hogan UUV:[id number] DOB: 1928/06/15 DOA: 01/23/2020  PCP: Orvis Brill, [facility]  Patient coming from: [place]  I have personally briefly reviewed patient's old medical records in [place].  Chief Concern: Altered mental status  HPI: Amanda Hogan is a 86 y.o. female with medical history significant for history of A. fib with RVR, status post 2 falls on day presentation at [place], baseline dementia, hypertension, hyperlipidemia, anxiety/depression, CAD, grade 2 diastolic dysfunction, presented to the emergency department via [place] staff for chief concern of altered mental status.  Unable to obtain history from patient as she was nonverbal.  I called daughter, Amanda Hogan, and daughter states that she has not seen the patient in person in a long time due to Covid pandemic and visitation restrictions in place at [place].  Patient states that she calls her mother once a week.  And the last time that she spoke to her mother was last week and the patient notes that her daughter is calling and that she says that she is 'at that place'.  At bedside, patient can somewhat follow commands, she does not tell me her name. She opens eyes spontaneously.  She moves spontaneously.   H&P was minimally obtained due to patient mental status.  ED Course: Discussed with ED provider  Review of Systems: As per HPI otherwise 10 point review of systems negative.  Past Medical History:  Diagnosis Date   Acid reflux    Angina    Anxiety 02/23/2012   Arthritis    Chest pain    negative lexiscan myoview 53/13, last echo 02/24/12-EF 47-425, mid systolic obliteration of the LV, cavity aortic sclerosis   Complication of anesthesia    "w/my back; gave me too much; thought I'd had stroke but didn't"   Dementia ([place]) 02/23/2012   Exertional dyspnea    Fibromyalgia    Heart murmur    High cholesterol    HTN  (hypertension)    Hypertension    Kidney stone    Memory disorder, possibly not taking home meds at times 07/22/2011   Mild carotid artery disease ([place]) 08/05/11   carotid dopplers, mild disease   Restless leg syndrome    Sinus malignant neoplasm ([place])    Stroke ([place])    "said it looked like bullet holes; had a bunch"   TIA (transient ischemic attack)    Past Surgical History:  Procedure Laterality Date   BREAST CYST EXCISION     left   CARPAL TUNNEL RELEASE     right   CATARACT EXTRACTION W/ INTRAOCULAR LENS  IMPLANT, BILATERAL     CHOLECYSTECTOMY  2005   ELBOW BURSA SURGERY     left   EYE SURGERY  1940   fixed my crossed eyes"   HERNIA REPAIR  2007   "stomach"   KIDNEY STONE SURGERY     "right side; cut it out"   KNEE CARTILAGE SURGERY     right   LUMBAR SPINE SURGERY     "put box in my lower back"   LUNG SURGERY     "made 3 holes and pulled gel out"   VAGINAL HYSTERECTOMY     Social History:  reports that she has never smoked. She has never used smokeless tobacco. She reports that she does not drink alcohol and does not use drugs.  Allergies  Allergen Reactions   Iohexol Shortness Of Breath and Itching   Codeine Sulfate Other (  See Comments)    Unknown reaction.   Contrast Media [Iodinated Diagnostic Agents] Other (See Comments)    Reaction:  Unknown    Family History  Problem Relation Age of Onset   Diabetes Neg Hx    Family history: Family history reviewed and no family history of dementia.  Prior to Admission medications   Medication Sig Start Date End Date Taking? Authorizing Provider  acetaminophen (TYLENOL) 500 MG tablet Take 1,000 mg by mouth every 8 (eight) hours.     [provider]  acetaminophen (TYLENOL) 500 MG tablet Take 500 mg by mouth every 4 (four) hours as needed for fever or headache.    [provider]  alum & mag hydroxide-simeth ([place]) 200-200-20 MG/5ML suspension Take 30 mLs by mouth every 6  (six) hours as needed for indigestion or heartburn.    [provider]  amLODipine (NORVASC) 2.5 MG tablet Take 2.5 mg by mouth daily.    [provider]  aspirin (ASPIRIN CHILDRENS) 81 MG chewable tablet Chew 81 mg by mouth every morning.    [provider]  atorvastatin (LIPITOR) 40 MG tablet Take 1 tablet by mouth at bedtime.  04/25/15   [provider]  calcium carbonate (TUMS - DOSED IN MG ELEMENTAL CALCIUM) 500 MG chewable tablet Chew 500 mg by mouth 3 (three) times daily before meals.    [provider]  clopidogrel (PLAVIX) 75 MG tablet Take 75 mg by mouth daily.    [provider]  cyanocobalamin (,VITAMIN B-12,) 1000 MCG/ML injection Inject 1,000 mcg into the muscle every 30 (thirty) days. 15th    [provider]  escitalopram (LEXAPRO) 5 MG tablet Take 5 mg by mouth at bedtime.     [provider]  furosemide (LASIX) 20 MG tablet Take 30 mg by mouth daily.     [provider]  gabapentin (NEURONTIN) 300 MG capsule Take 300 mg by mouth 3 (three) times daily.    [provider]  guaiFENesin (ROBITUSSIN) 100 MG/5ML liquid Take 200 mg by mouth every 6 (six) hours as needed for cough.    [provider]  loperamide (IMODIUM) 2 MG capsule Take 2 mg by mouth as needed for diarrhea or loose stools. (max 8 doses in 24 hours)    [provider]  LORazepam (ATIVAN) 0.5 MG tablet Take 0.5 mg by mouth 2 (two) times daily as needed for anxiety.    [provider]  magnesium hydroxide (MILK OF MAGNESIA) 400 MG/5ML suspension Take 30 mLs by mouth at bedtime as needed for mild constipation.    [provider]  memantine (NAMENDA) 5 MG tablet Take 5 mg by mouth daily.     [provider]  metoprolol succinate (TOPROL-XL) 100 MG 24 hr tablet Take 100 mg by mouth daily. Take with or immediately following a meal.    [provider]  neomycin-bacitracin-polymyxin  (NEOSPORIN) ointment Apply 1 application topically as needed for wound care.    [provider]  ondansetron (ZOFRAN) 4 MG tablet Take 4 mg by mouth every 6 (six) hours as needed for nausea or vomiting.    [provider]  polyethylene glycol (MIRALAX / GLYCOLAX) 17 g packet Take 17 g by mouth daily as needed for mild constipation.    [provider]  QUEtiapine (SEROQUEL) 25 MG tablet Take 12.5 mg by mouth 2 (two) times daily as needed (agitation).     [provider]  rOPINIRole (REQUIP) 1 MG tablet Take  1 mg by mouth 3 (three) times daily.     [provider]  senna (SENOKOT) 8.6 MG tablet Take 2 tablets by mouth at bedtime.     [provider]  spironolactone (ALDACTONE) 25 MG tablet Take 25 mg by mouth daily.    [provider]   Physical Exam: Vitals:   01/23/20 0926 01/23/20 1100 01/23/20 1300 01/23/20 1419  BP: 133/68 130/70 128/76 (!) 147/72  Pulse: 71 (!) 128 (!) 118 (!) 140  Resp: 18 17 16 20   Temp: (!) 97.1 F (36.2 C)     TempSrc: Axillary     SpO2: 97% 97% 98% 98%   Constitutional: NAD, comfortable Eyes: PERRL, lids and conjunctivae normal ENMT: Mucous membranes are moist. Posterior pharynx clear of any exudate or lesions. age-appropriate dentition.  Neck: normal, supple, no masses, no thyromegaly Respiratory: clear to auscultation bilaterally, no wheezing, no crackles. Normal respiratory effort. No accessory muscle use.  Cardiovascular: Regular rate and rhythm, no murmurs / rubs / gallops. No extremity edema. 2+ pedal pulses. No carotid bruits.  Abdomen: Obese abdomen with pannus, no tenderness, no masses palpated. No hepatosplenomegaly. Bowel sounds positive.  Musculoskeletal: no clubbing / cyanosis. No joint deformity upper and lower extremities. Good ROM, no contractures. Normal muscle tone.  Skin: Bilateral inguinal and groin rashes, lesions, ulcers. No induration Neurologic: Sensation intact. Strength 5/5  in all 4.  Psychiatric: Nonverbal patient, unable to assess mood, judgment, and insight.   Labs on Admission: I have personally reviewed following labs and imaging studies  CBC: Recent Labs  Lab 01/23/20 0929  WBC 9.0  HGB 11.8*  HCT 37.3  MCV 97.9  PLT 093   Basic Metabolic Panel: Recent Labs  Lab 01/23/20 0929  NA 136  K 5.0  CL 100  CO2 22  GLUCOSE 142*  BUN 71*  CREATININE 2.15*  CALCIUM 9.0   Liver Function Tests: Recent Labs  Lab 01/23/20 0929  AST 46*  ALT 46*  ALKPHOS 98  BILITOT 2.8*  PROT 7.3  ALBUMIN 3.5   Coagulation Profile: Recent Labs  Lab 01/23/20 0929  INR 1.5*   CBG: Recent Labs  Lab 01/23/20 0944  GLUCAP 136*   Urine analysis:    Component Value Date/Time   COLORURINE AMBER (A) 01/23/2020 1229   APPEARANCEUR CLOUDY (A) 01/23/2020 1229   APPEARANCEUR Cloudy 11/30/2012 1020   LABSPEC 1.023 01/23/2020 1229   LABSPEC 1.020 11/30/2012 1020   PHURINE 5.0 01/23/2020 1229   GLUCOSEU NEGATIVE 01/23/2020 1229   GLUCOSEU Negative 11/30/2012 1020   HGBUR NEGATIVE 01/23/2020 1229   BILIRUBINUR NEGATIVE 01/23/2020 1229   BILIRUBINUR 1+ 11/30/2012 1020   KETONESUR NEGATIVE 01/23/2020 1229   PROTEINUR 100 (A) 01/23/2020 1229   UROBILINOGEN 0.2 10/27/2014 1451   NITRITE NEGATIVE 01/23/2020 1229   LEUKOCYTESUR NEGATIVE 01/23/2020 1229   LEUKOCYTESUR 2+ 11/30/2012 1020   Radiological Exams on Admission: Personally reviewed and I agree with radiologist reading as below.  DG Lumbar Spine 2-3 Views  Result Date: 01/23/2020 CLINICAL DATA:  Low back pain after fall. EXAM: LUMBAR SPINE - 2-3 VIEW COMPARISON:  June 19, 2015. FINDINGS: Status post surgical posterior fusion of L4-5 with bilateral intrapedicular screw placement and interbody fusion. Severe degenerative disc disease is noted at L1-2 and L2-3. No fracture is noted. IMPRESSION: Postsurgical and degenerative changes as described above. No acute abnormality seen in the lumbar spine.  Aortic Atherosclerosis (ICD10-I70.0). Electronically Signed   By: Marijo Conception M.D.   On: 01/23/2020  11:26   CT Head Wo Contrast  Result Date: 01/23/2020 CLINICAL DATA:  Altered mental status. Fell twice today. EXAM: CT HEAD WITHOUT CONTRAST TECHNIQUE: Contiguous axial images were obtained from the base of the skull through the vertex without intravenous contrast. 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 No significant change in mild-to-moderate enlargement of the ventricles and subarachnoid spaces. Stable marked patchy white matter low density in both cerebral hemispheres. No intracranial hemorrhage, mass lesion or CT evidence of acute infarction. Vascular: No hyperdense vessel or unexpected calcification. Skull: Choose 1 Sinuses/Orbits: Status post bilateral cataract extraction. Unremarkable bones and included paranasal sinuses. Other: None. IMPRESSION: 1. No acute abnormality. 2. 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 3. Stable mild-to-moderate diffuse cerebral and cerebellar atrophy. 4. Stable marked chronic small vessel white matter ischemic changes in both cerebral hemispheres. Electronically Signed   By: Claudie Revering M.D.   On: 01/23/2020 11:42   DG Chest Port 1 View  Result Date: 01/23/2020 CLINICAL DATA:  Altered mental status. EXAM: PORTABLE CHEST 1 VIEW COMPARISON:  July 06, 2019. FINDINGS: Stable cardiomegaly. No pneumothorax is noted. Minimal right pleural effusion is noted. Left lung is clear. Minimal right basilar subsegmental atelectasis is noted. Bony thorax is unremarkable. IMPRESSION: Minimal right pleural effusion. Minimal right basilar subsegmental atelectasis. Electronically Signed   By: Marijo Conception M.D.   On: 01/23/2020 11:24   DG Hip Unilat W or Wo Pelvis 2-3 Views Left  Result Date: 01/23/2020 CLINICAL DATA:  Two falls, dementia, altered mental status, LEFT hip discomfort with palpation EXAM: DG HIP (WITH  OR WITHOUT PELVIS) 2-3V LEFT COMPARISON:  01/08/2014 FINDINGS: Scattered motion artifact. Osseous demineralization. Prior lower lumbar fusion. Hip and SI joint spaces preserved. No acute fracture, dislocation, or bone destruction. Minimal spur formation at LEFT femoral head. IMPRESSION: Osseous demineralization with minimal degenerative changes of LEFT hip. No acute abnormalities. Electronically Signed   By: [name] Dana M.D.   On: 01/23/2020 10:43   EKG: Independently reviewed, showing A. fib with RVR  Assessment/Plan  Active Problems:   Sepsis ([place])   Sepsis presentation-etiology unknown at this time -Elevated lactic at 3.3 down trended to 2.8 -Afebrile and no leukocytosis -Status post 1 L bolus in the ED -Was started on LR 150 cc/h, this was discontinued as patient's blood pressure was within normal limits -Cefepime 2 g IV, metronidazole IV, vancomycin IV -Blood cultures -Urine culture  AMS - per daughter, patient was oriented to herself last week - h/o TIA  - Nonverbal to me, however later on during the evening, she asked for food and follows commands, and to complain of bilateral leg pain which appears to be chronic, low clinical suspicion for stroke/tia at this time - CT head was negative for hemorrhagic stroke -Per daughter, she states that her mother will sometimes be nonverbal when she does not know the individual -Fall precautions  Afib w/ rvr in setting of elevated lactic acid and elevated bili and CR, suspect secondary to sepsis -Patient is not on anticoagulation due to risk of fall and bleed per daughter -With elevated BNP, we will order echo - Checking TSH, Abdominal US, blood cx - UA is negative for nitrites and leuks  Acute kidney injury-etiology unclear at this time, status post IVF and bolus fluids -Patient is eating encourage p.o. intake -Echo for possible cardiorenal -Due to elevated BNP, leaning towards cardiorenal, stopped IVF -BMP in the  a.m.  Hypertension-amlodipine 2.5 mg, Lasix 30  mg  Hyperlipidemia-atorvastatin 40 mg nightly  History of grade 2 diastolic dysfunction, EF 60 to 65%, resume metoprolol succinate 100 mg daily, spironolactone 25 mg daily, furosemide 30 mg daily -Last echo in 10/28/2014  Bilateral lower extremity pain-appears to be chronic complaints -Ultrasound of bilateral lower extremity for DVT ordered for bilateral leg pain and read as negative for DVT on 11/11/2019  Bilateral inguinal rash-present on admission, nystatin powder to the area twice daily -Discussed with daughter and she reports that her mother refuses to be bathed at [place] 5 mg daily  Anxiety/depression-Escitalopram 5 mg nightly, quetiapine 12.5 mg as needed for agitation, Ativan 0.5 mg as needed for anxiety  Restless leg-home Requip 1 mg p.o. 3 times daily  DVT prophylaxis: Heparin Code Status: DNR Diet: Cardiac Family Communication: Discussed with daughter Disposition Plan: Pending clinical course Consults called: Not indicated at this time Admission status: Progressive cardiac with telemetry  Myrta Mercer N Aysiah Jurado D.O. [facility]  If 7AM-7PM, please contact day-coverage provider [email or web address]  01/23/2020, 3:37 PM

## 2020-01-23 NOTE — Progress Notes (Signed)
PHARMACY -  BRIEF ANTIBIOTIC NOTE   Pharmacy has received consult(s) for Cefepime and Vancomycin from an ED provider.  The patient's profile has been reviewed for ht/wt/allergies/indication/available labs.    One time order(s) placed for Cefepime 2g and Vancomycin 1g by ED provider.  Further antibiotics/pharmacy consults should be ordered by admitting physician if indicated.                       Thank you, Vira Blanco 01/23/2020  1:37 PM

## 2020-01-23 NOTE — ED Notes (Signed)
Called Lab and advised we where unable to get blood cultures do to a difficult iv stick , they advised they would send someone to collect blood culture and additional lab

## 2020-01-23 NOTE — Sepsis Progress Note (Signed)
Sepsis protocol is being followed by [facility]. 

## 2020-01-23 NOTE — ED Provider Notes (Addendum)
[facility] Emergency Department Provider Note   ____________________________________________    I have reviewed the triage vital signs and the nursing notes.   HISTORY  Chief Complaint Altered Mental Status  With a history of dementia, limiting history   HPI Amanda Hogan is a 86 y.o. female with a history of atrial fibrillation with RVR and as noted below who presents from [place].  Apparently the patient had 2 falls today which is atypical for her.  Also reportedly she was more altered significantly from her baseline dementia so was referred to the emergency department.  Patient is unable to give any significant history  Past Medical History:  Diagnosis Date  . Acid reflux   . Angina   . Anxiety 02/23/2012  . Arthritis   . Chest pain    negative lexiscan myoview 53/13, last echo 02/24/12-EF 37-628, mid systolic obliteration of the LV, cavity aortic sclerosis  . Complication of anesthesia    "w/my back; gave me too much; thought I'd had stroke but didn't"  . Dementia ([place]) 02/23/2012  . Exertional dyspnea   . Fibromyalgia   . Heart murmur   . High cholesterol   . HTN (hypertension)   . Hypertension   . Kidney stone   . Memory disorder, possibly not taking home meds at times 07/22/2011  . Mild carotid artery disease ([place]) 08/05/11   carotid dopplers, mild disease  . Restless leg syndrome   . Sinus malignant neoplasm ([place])   . Stroke [facility])    "said it looked like bullet holes; had a bunch"  . TIA (transient ischemic attack)     Patient Active Problem List   Diagnosis Date Noted  . Acute kidney injury ([place]) 12/27/2014  . Acute CVA (cerebrovascular accident) ([place]) 12/16/2014  . Diastolic dysfunction-grade 2 10/29/2014  . Pulmonary hypertension-moderate 10/29/2014  . Troponin level elevated-suspect demand ischemia from AF with RVR 10/28/2014  . Lymphoma history 10/28/2014  . Acute diastolic CHF (congestive heart failure) ([place])  10/27/2014  . Atrial fibrillation with RVR (new)   . TIA (transient ischemic attack) 02/25/2012  . Encephalopathy acute 02/23/2012  . Anemia 02/23/2012  . Chronic back pain 02/23/2012  . Anxiety 02/23/2012  . Mild carotid artery disease ([place]) 08/05/2011  . Memory disorder, possibly not taking home meds at times 07/22/2011  . Hypertension, at times poorly controlled 07/21/2011  . Chest pain-low risk Myoview May 2013 07/21/2011  . Hyperlipidemia 07/21/2011  . Gastroesophageal reflux disease 07/21/2011  . History of TIA (transient ischemic attack) 07/21/2011  . Obesity 07/21/2011    Past Surgical History:  Procedure Laterality Date  . BREAST CYST EXCISION     left  . CARPAL TUNNEL RELEASE     right  . CATARACT EXTRACTION W/ INTRAOCULAR LENS  IMPLANT, BILATERAL    . CHOLECYSTECTOMY  2005  . ELBOW BURSA SURGERY     left  . EYE SURGERY  1940   fixed my crossed eyes"  . HERNIA REPAIR  2007   "stomach"  . KIDNEY STONE SURGERY     "right side; cut it out"  . KNEE CARTILAGE SURGERY     right  . LUMBAR SPINE SURGERY     "put box in my lower back"  . LUNG SURGERY     "made 3 holes and pulled gel out"  . VAGINAL HYSTERECTOMY      Prior to Admission medications   Medication Sig Start Date End Date Taking? Authorizing Provider  acetaminophen (TYLENOL)  500 MG tablet Take 1,000 mg by mouth every 8 (eight) hours.     [provider]  acetaminophen (TYLENOL) 500 MG tablet Take 500 mg by mouth every 4 (four) hours as needed for fever or headache.    [provider]  alum & mag hydroxide-simeth ([place]) 200-200-20 MG/5ML suspension Take 30 mLs by mouth every 6 (six) hours as needed for indigestion or heartburn.    [provider]  amLODipine (NORVASC) 2.5 MG tablet Take 2.5 mg by mouth daily.    [provider]  aspirin (ASPIRIN CHILDRENS) 81 MG chewable tablet Chew 81 mg by mouth every morning.    [provider]  atorvastatin (LIPITOR) 40 MG  tablet Take 1 tablet by mouth at bedtime.  04/25/15   [provider]  calcium carbonate (TUMS - DOSED IN MG ELEMENTAL CALCIUM) 500 MG chewable tablet Chew 500 mg by mouth 3 (three) times daily before meals.    [provider]  clopidogrel (PLAVIX) 75 MG tablet Take 75 mg by mouth daily.    [provider]  cyanocobalamin (,VITAMIN B-12,) 1000 MCG/ML injection Inject 1,000 mcg into the muscle every 30 (thirty) days. 15th    [provider]  escitalopram (LEXAPRO) 5 MG tablet Take 5 mg by mouth at bedtime.     [provider]  furosemide (LASIX) 20 MG tablet Take 30 mg by mouth daily.     [provider]  gabapentin (NEURONTIN) 300 MG capsule Take 300 mg by mouth 3 (three) times daily.    [provider]  guaiFENesin (ROBITUSSIN) 100 MG/5ML liquid Take 200 mg by mouth every 6 (six) hours as needed for cough.    [provider]  loperamide (IMODIUM) 2 MG capsule Take 2 mg by mouth as needed for diarrhea or loose stools. (max 8 doses in 24 hours)    [provider]  LORazepam (ATIVAN) 0.5 MG tablet Take 0.5 mg by mouth 2 (two) times daily as needed for anxiety.    [provider]  magnesium hydroxide (MILK OF MAGNESIA) 400 MG/5ML suspension Take 30 mLs by mouth at bedtime as needed for mild constipation.    [provider]  memantine (NAMENDA) 5 MG tablet Take 5 mg by mouth daily.     [provider]  metoprolol succinate (TOPROL-XL) 100 MG 24 hr tablet Take 100 mg by mouth daily. Take with or immediately following a meal.    [provider]  neomycin-bacitracin-polymyxin (NEOSPORIN) ointment Apply 1 application topically as needed for wound care.    [provider]  ondansetron (ZOFRAN) 4 MG tablet Take 4 mg by mouth every 6 (six) hours as needed for nausea or vomiting.    [provider]  polyethylene glycol (MIRALAX / GLYCOLAX) 17 g packet Take 17 g by mouth daily as  needed for mild constipation.    [provider]  QUEtiapine (SEROQUEL) 25 MG tablet Take 12.5 mg by mouth 2 (two) times daily as needed (agitation).     [provider]  rOPINIRole (REQUIP) 1 MG tablet Take 1 mg by mouth 3 (three) times daily.     [provider]  senna (SENOKOT) 8.6 MG tablet Take 2 tablets by mouth at bedtime.     [provider]  spironolactone (ALDACTONE) 25 MG tablet Take 25 mg by mouth daily.    [provider]     Allergies Iohexol, Codeine sulfate, and Contrast media [iodinated diagnostic agents]  Family History  Problem  Relation Age of Onset  . Diabetes Neg Hx     Social History Social History   Tobacco Use  . Smoking status: Never Smoker  . Smokeless tobacco: Never Used  Substance Use Topics  . Alcohol use: No  . Drug use: No    Review of Systems  Constitutional: No fevers reported Eyes: No visual changes.  ENT: No neck pain Cardiovascular: Denies chest pain. Respiratory: Denies shortness of breath. Gastrointestinal: No abdominal pain.   Genitourinary: No reports of foul-smelling urine. Musculoskeletal: Mild low back pain, left hip pain Skin: Negative for rash. Neurological: Negative for headaches    ____________________________________________   PHYSICAL EXAM:  VITAL SIGNS: ED Triage Vitals [01/23/20 0926]  Enc Vitals Group     BP 133/68     Pulse Rate 71     Resp 18     Temp (!) 97.1 F (36.2 C)     Temp Source Axillary     SpO2 97 %     Weight      Height      Head Circumference      Peak Flow      Pain Score      Pain Loc      Pain Edu?      Excl. in [place]?     Constitutional: Alert, but disoriented Eyes: Conjunctivae are normal.   Nose: No congestion/rhinnorhea. Mouth/Throat: Mucous membranes are moist.    Cardiovascular: Normal rate, regular rhythm. Grossly normal heart sounds.  Good peripheral circulation. Respiratory: Normal respiratory effort.  No retractions. Lungs  CTAB. Gastrointestinal: Soft and nontender. No distention.    Musculoskeletal: Mild lumbar spinal tenderness to palpation, no swelling or bruising.  Warm and well perfused extremities Neurologic:  . No gross focal neurologic deficits are appreciated.  Skin:  Skin is warm, dry and intact. No rash noted. Psychiatric: Mood and affect are normal. Speech and behavior are normal.  ____________________________________________   LABS (all labs ordered are listed, but only abnormal results are displayed)  Labs Reviewed  COMPREHENSIVE METABOLIC PANEL - Abnormal; Notable for the following components:      Result Value   Glucose, Bld 142 (*)    BUN 71 (*)    Creatinine, Ser 2.15 (*)    AST 46 (*)    ALT 46 (*)    Total Bilirubin 2.8 (*)    GFR, Estimated 22 (*)    All other components within normal limits  CBC - Abnormal; Notable for the following components:   RBC 3.81 (*)    Hemoglobin 11.8 (*)    RDW 17.8 (*)    All other components within normal limits  PROTIME-INR - Abnormal; Notable for the following components:   Prothrombin Time 17.4 (*)    INR 1.5 (*)    All other components within normal limits  URINALYSIS, COMPLETE (UACMP) WITH MICROSCOPIC - Abnormal; Notable for the following components:   Color, Urine AMBER (*)    APPearance CLOUDY (*)    Protein, ur 100 (*)    Bacteria, UA MANY (*)    All other components within normal limits  LACTIC ACID, PLASMA - Abnormal; Notable for the following components:   Lactic Acid, Venous 3.3 (*)    All other components within normal limits  CBG MONITORING, ED - Abnormal; Notable for the following components:   Glucose-Capillary 136 (*)    All other components within normal limits  CULTURE, BLOOD (SINGLE)  LACTIC ACID, PLASMA   ____________________________________________  EKG  ED ECG  REPORT I, [name] Drafts, the attending physician, personally viewed and interpreted this ECG.  Date: 01/23/2020  Rhythm: Atrial fibrillation with  rapid ventricular response QRS Axis: normal Intervals: normal ST/T Wave abnormalities: normal Narrative Interpretation: no evidence of acute ischemia  ____________________________________________  RADIOLOGY  CT head reviewed by me, no acute abnormalities Chest x-ray unremarkable, hip x-ray without fracture lumbar spine x-ray without fracture ____________________________________________   PROCEDURES  Procedure(s) performed: No  Procedures   Critical Care performed: yes  CRITICAL CARE Performed by: [name] Drafts   Total critical care time:30 minutes  Critical care time was exclusive of separately billable procedures and treating other patients.  Critical care was necessary to treat or prevent imminent or life-threatening deterioration.  Critical care was time spent personally by me on the following activities: development of treatment plan with patient and/or surrogate as well as nursing, discussions with consultants, evaluation of patient's response to treatment, examination of patient, obtaining history from patient or surrogate, ordering and performing treatments and interventions, ordering and review of laboratory studies, ordering and review of radiographic studies, pulse oximetry and re-evaluation of patient's condition.  ____________________________________________   INITIAL IMPRESSION / ASSESSMENT AND PLAN / ED COURSE  Pertinent labs & imaging results that were available during my care of the patient were reviewed by me and considered in my medical decision making (see chart for details).  Patient presents with altered mental status reportedly and falls as noted above.  Normal range of motion of all extremities, mild tenderness along the lumbar spine.  X-rays and imaging ordered.  Differential includes sepsis, worsening dementia, electrolyte abnormalities or dehydration, atrial fibrillation  Lab work demonstrates elevation of BUN and creatinine consistent with  dehydration, white blood cell count is normal which is reassuring however lactic acid is elevated at 3.3  Patient with continued elevation of her heart rate, unclear if this is related to atrial fibrillation but given elevated lactic acid, mild hypothermia and worsening mental status, concern for sepsis, covered with broad-spectrum antibiotics.  Patient's heart rate has remained elevated after IV fluid bolus, will start low-dose Cardizem drip  Have consulted the hospitalist service for admission      ____________________________________________   FINAL CLINICAL IMPRESSION(S) / ED DIAGNOSES  Final diagnoses:  Sepsis, due to unspecified organism, unspecified whether acute organ dysfunction present [facility])  Atrial fibrillation with rapid ventricular response ([place])  AKI (acute kidney injury) ([place])        Note:  This document was prepared using Dragon voice recognition software and may include unintentional dictation errors.   [name] Drafts, MD 01/23/20 1447    [name] Drafts, MD 01/23/20 [phone]

## 2020-01-23 NOTE — ED Notes (Signed)
Attempted to call report to floor , Nurse in contact room will call back when she is out

## 2020-01-23 NOTE — ED Notes (Signed)
Pt given a meal tray, pt will be NPO after Midnight for ultrasound

## 2020-01-23 NOTE — ED Notes (Signed)
NPO after midnight.

## 2020-01-23 NOTE — ED Notes (Signed)
Pt is a difficult stick and atb were started prior to blood culture being drawn

## 2020-01-23 NOTE — ED Notes (Signed)
Message hospitalist to give pt a meal tray

## 2020-01-23 NOTE — ED Notes (Signed)
IV team at bedside 

## 2020-01-23 NOTE — ED Notes (Signed)
Spoke with float nurse and advised 2nd line has been placed by IV team , and to start cardizem and next antibiotic

## 2020-01-23 NOTE — ED Notes (Addendum)
Amanda Hogan notified of critical lactic acid of 3.3 - new orders received  Pt had orders for lactated ringers and NaCL - Dr Corky Downs verbal ordered to only give the NaCL 1037ml bolus at this time and then start lactated ringers

## 2020-01-23 NOTE — ED Triage Notes (Addendum)
Pt comes into the ED via EMS from [facility], dementia unit, reports she is altered more then normal, 2 falls today, having some left hit discomfort with palpation. 147/92, CBG 164, [id number] a-fib which has been her base line, pt is alert on arrival. EMS reports concerns for UTI, pt was recently started on some new medication.

## 2020-01-23 NOTE — Progress Notes (Signed)
Pharmacy Antibiotic Note  Amanda Hogan is a 86 y.o. female admitted on 01/23/2020. Pharmacy has been consulted for vancomycin and cefepime dosing.  Plan: Vancomycin 1000 mg IV x 1 given in the ED (no height/weight info for LD). Will continue with vancomycin 1000 mg IV q24h. Low threshhold for changing to q48h regimen if vancomycin continues.  Cefepime 2 g IV q24h  Will follow renal function closely.  Height: 5\' 6"  (167.6 cm) Weight: 89.4 kg (197 lb) IBW/kg (Calculated) : 59.3  Temp (24hrs), Avg:97.1 F (36.2 C), Min:97.1 F (36.2 C), Max:97.1 F (36.2 C)  Recent Labs  Lab 01/23/20 0929 01/23/20 1229 01/23/20 1728  WBC 9.0  --   --   CREATININE 2.15*  --   --   LATICACIDVEN  --  3.3* 2.8*    Estimated Creatinine Clearance: 20.4 mL/min (A) (by C-G formula based on SCr of 2.15 mg/dL (H)).    Allergies  Allergen Reactions  . Iohexol Shortness Of Breath and Itching  . Codeine Sulfate Other (See Comments)    Unknown reaction.  . Contrast Media [Iodinated Diagnostic Agents] Other (See Comments)    Reaction:  Unknown     Antimicrobials this admission: Vancomycin 11/4 >> Cefepime 11/4 >>   Microbiology results: 11/4 BCx: pending   Thank you for allowing pharmacy to be a part of this patient's care.  Tawnya Crook, PharmD 01/23/2020 6:27 PM

## 2020-01-23 NOTE — ED Notes (Signed)
Lab at bedside

## 2020-01-23 NOTE — Progress Notes (Signed)
CODE SEPSIS - PHARMACY COMMUNICATION  **Broad Spectrum Antibiotics should be administered within 1 hour of Sepsis diagnosis**  Time Code Sepsis Called/Page Received: 13:11  Antibiotics Ordered: Cefepime, Vancomycin, Metronidazole  Time of 1st antibiotic administration: Cefepime given at 13:26  Additional action taken by pharmacy: n/a  If necessary, Name of Provider/Nurse Contacted: n/a    Vira Blanco ,PharmD Clinical Pharmacist  01/23/2020  1:35 PM

## 2020-01-24 ENCOUNTER — Observation Stay: Payer: PPO

## 2020-01-24 ENCOUNTER — Observation Stay
Admit: 2020-01-24 | Discharge: 2020-01-24 | Disposition: A | Discharge status: Home / Self Care | Payer: PPO | Attending: Internal Medicine | Admitting: Internal Medicine

## 2020-01-24 DIAGNOSIS — K7689 Other specified diseases of liver: Secondary | ICD-10-CM | POA: Diagnosis not present

## 2020-01-24 DIAGNOSIS — N179 Acute kidney failure, unspecified: Secondary | ICD-10-CM

## 2020-01-24 DIAGNOSIS — K219 Gastro-esophageal reflux disease without esophagitis: Secondary | ICD-10-CM | POA: Diagnosis present

## 2020-01-24 DIAGNOSIS — Z66 Do not resuscitate: Secondary | ICD-10-CM | POA: Diagnosis present

## 2020-01-24 DIAGNOSIS — I480 Paroxysmal atrial fibrillation: Secondary | ICD-10-CM | POA: Diagnosis present

## 2020-01-24 DIAGNOSIS — F32A Depression, unspecified: Secondary | ICD-10-CM | POA: Diagnosis present

## 2020-01-24 DIAGNOSIS — I272 Pulmonary hypertension, unspecified: Secondary | ICD-10-CM | POA: Diagnosis present

## 2020-01-24 DIAGNOSIS — I2510 Atherosclerotic heart disease of native coronary artery without angina pectoris: Secondary | ICD-10-CM | POA: Diagnosis present

## 2020-01-24 DIAGNOSIS — R21 Rash and other nonspecific skin eruption: Secondary | ICD-10-CM | POA: Diagnosis present

## 2020-01-24 DIAGNOSIS — E860 Dehydration: Secondary | ICD-10-CM | POA: Diagnosis present

## 2020-01-24 DIAGNOSIS — F419 Anxiety disorder, unspecified: Secondary | ICD-10-CM | POA: Diagnosis present

## 2020-01-24 DIAGNOSIS — G20 Parkinson's disease: Secondary | ICD-10-CM | POA: Diagnosis not present

## 2020-01-24 DIAGNOSIS — F0281 Dementia in other diseases classified elsewhere with behavioral disturbance: Secondary | ICD-10-CM | POA: Diagnosis not present

## 2020-01-24 DIAGNOSIS — R627 Adult failure to thrive: Secondary | ICD-10-CM | POA: Diagnosis present

## 2020-01-24 DIAGNOSIS — R6511 Systemic inflammatory response syndrome (SIRS) of non-infectious origin with acute organ dysfunction: Secondary | ICD-10-CM | POA: Diagnosis present

## 2020-01-24 DIAGNOSIS — I110 Hypertensive heart disease with heart failure: Secondary | ICD-10-CM | POA: Diagnosis present

## 2020-01-24 DIAGNOSIS — W1830XA Fall on same level, unspecified, initial encounter: Secondary | ICD-10-CM | POA: Diagnosis present

## 2020-01-24 DIAGNOSIS — R6510 Systemic inflammatory response syndrome (SIRS) of non-infectious origin without acute organ dysfunction: Secondary | ICD-10-CM

## 2020-01-24 DIAGNOSIS — M797 Fibromyalgia: Secondary | ICD-10-CM | POA: Diagnosis present

## 2020-01-24 DIAGNOSIS — R4182 Altered mental status, unspecified: Secondary | ICD-10-CM

## 2020-01-24 DIAGNOSIS — R188 Other ascites: Secondary | ICD-10-CM | POA: Diagnosis present

## 2020-01-24 DIAGNOSIS — F0390 Unspecified dementia without behavioral disturbance: Secondary | ICD-10-CM | POA: Diagnosis present

## 2020-01-24 DIAGNOSIS — E785 Hyperlipidemia, unspecified: Secondary | ICD-10-CM | POA: Diagnosis present

## 2020-01-24 DIAGNOSIS — G2581 Restless legs syndrome: Secondary | ICD-10-CM | POA: Diagnosis present

## 2020-01-24 DIAGNOSIS — Z20822 Contact with and (suspected) exposure to covid-19: Secondary | ICD-10-CM | POA: Diagnosis present

## 2020-01-24 DIAGNOSIS — E669 Obesity, unspecified: Secondary | ICD-10-CM | POA: Diagnosis present

## 2020-01-24 DIAGNOSIS — M79604 Pain in right leg: Secondary | ICD-10-CM | POA: Diagnosis present

## 2020-01-24 DIAGNOSIS — I5032 Chronic diastolic (congestive) heart failure: Secondary | ICD-10-CM | POA: Diagnosis present

## 2020-01-24 DIAGNOSIS — K760 Fatty (change of) liver, not elsewhere classified: Secondary | ICD-10-CM | POA: Diagnosis present

## 2020-01-24 DIAGNOSIS — Z515 Encounter for palliative care: Secondary | ICD-10-CM | POA: Diagnosis not present

## 2020-01-24 LAB — ECHOCARDIOGRAM COMPLETE
Height: 66 in
S' Lateral: 3.52 cm
Weight: 3308.8 oz

## 2020-01-24 LAB — CBC
HCT: 36 % (ref 36.0–46.0)
Hemoglobin: 11.1 g/dL — ABNORMAL LOW (ref 12.0–15.0)
MCH: 30.7 pg (ref 26.0–34.0)
MCHC: 30.8 g/dL (ref 30.0–36.0)
MCV: 99.4 fL (ref 80.0–100.0)
Platelets: 204 10*3/uL (ref 150–400)
RBC: 3.62 MIL/uL — ABNORMAL LOW (ref 3.87–5.11)
RDW: 17.8 % — ABNORMAL HIGH (ref 11.5–15.5)
WBC: 10.1 10*3/uL (ref 4.0–10.5)
nRBC: 0 % (ref 0.0–0.2)

## 2020-01-24 LAB — LACTIC ACID, PLASMA
Lactic Acid, Venous: 2.6 mmol/L (ref 0.5–1.9)
Lactic Acid, Venous: 2.4 mmol/L (ref 0.5–1.9)

## 2020-01-24 LAB — PROCALCITONIN: Procalcitonin: <0.1 ng/mL

## 2020-01-24 LAB — BASIC METABOLIC PANEL
Anion gap: 16 — ABNORMAL HIGH (ref 5–15)
BUN: 66 mg/dL — ABNORMAL HIGH (ref 8–23)
CO2: 16 mmol/L — ABNORMAL LOW (ref 22–32)
Calcium: 8.9 mg/dL (ref 8.9–10.3)
Chloride: 103 mmol/L (ref 98–111)
Creatinine, Ser: 2.13 mg/dL — ABNORMAL HIGH (ref 0.44–1.00)
GFR, Estimated: 22 mL/min — ABNORMAL LOW (ref >60)
Glucose, Bld: 117 mg/dL — ABNORMAL HIGH (ref 70–99)
Potassium: 5.6 mmol/L — ABNORMAL HIGH (ref 3.5–5.1)
Sodium: 135 mmol/L (ref 135–145)

## 2020-01-24 LAB — PROTIME-INR
INR: 1.6 — ABNORMAL HIGH (ref 0.8–1.2)
Prothrombin Time: 18.5 seconds — ABNORMAL HIGH (ref 11.4–15.2)

## 2020-01-24 MED ORDER — MORPHINE SULFATE (PF) 2 MG/ML IV SOLN
1.0000 mg | INTRAVENOUS | Status: DC | PRN
  Administered 2020-01-24: 1 mg via INTRAVENOUS
  Filled 2020-01-24: qty 1

## 2020-01-24 MED ORDER — SODIUM ZIRCONIUM CYCLOSILICATE 10 G PO PACK
10.0000 g | PACK | Freq: Once | ORAL | Status: AC
  Administered 2020-01-24: 10 g via ORAL
  Filled 2020-01-24: qty 1

## 2020-01-24 MED ORDER — SODIUM CHLORIDE 0.9 % IV SOLN
INTRAVENOUS | Status: DC | PRN
  Administered 2020-01-24 (×3): 250 mL via INTRAVENOUS

## 2020-01-24 MED ORDER — SODIUM CHLORIDE 0.9 % IV SOLN
2.0000 g | INTRAVENOUS | Status: DC
  Administered 2020-01-24 – 2020-01-25 (×2): 2 g via INTRAVENOUS
  Filled 2020-01-24 (×2): qty 2

## 2020-01-24 NOTE — Progress Notes (Signed)
*  PRELIMINARY RESULTS* Echocardiogram 2D Echocardiogram has been performed.  Sherrie Sport 01/24/2020, 12:24 PM

## 2020-01-24 NOTE — Evaluation (Signed)
Clinical/Bedside Swallow Evaluation Patient Details  Name: Amanda Hogan MRN: [id number] Date of Birth: [birth year]  Today's Date: 01/24/2020 Time: SLP Start Time (ACUTE ONLY): 1220 SLP Stop Time (ACUTE ONLY): 1245 SLP Time Calculation (min) (ACUTE ONLY): 25 min  Past Medical History:  Past Medical History:  Diagnosis Date  . Acid reflux   . Angina   . Anxiety 02/23/2012  . Arthritis   . Chest pain    negative lexiscan myoview 53/13, last echo 02/24/12-EF 56-812, mid systolic obliteration of the LV, cavity aortic sclerosis  . Complication of anesthesia    "w/my back; gave me too much; thought I'd had stroke but didn't"  . Dementia ([place]) 02/23/2012  . Exertional dyspnea   . Fibromyalgia   . Heart murmur   . High cholesterol   . HTN (hypertension)   . Hypertension   . Kidney stone   . Memory disorder, possibly not taking home meds at times 07/22/2011  . Mild carotid artery disease ([place]) 08/05/11   carotid dopplers, mild disease  . Restless leg syndrome   . Sinus malignant neoplasm ([place])   . Stroke [facility])    "said it looked like bullet holes; had a bunch"  . TIA (transient ischemic attack)    Past Surgical History:  Past Surgical History:  Procedure Laterality Date  . BREAST CYST EXCISION     left  . CARPAL TUNNEL RELEASE     right  . CATARACT EXTRACTION W/ INTRAOCULAR LENS  IMPLANT, BILATERAL    . CHOLECYSTECTOMY  2005  . ELBOW BURSA SURGERY     left  . EYE SURGERY  1940   fixed my crossed eyes"  . HERNIA REPAIR  2007   "stomach"  . KIDNEY STONE SURGERY     "right side; cut it out"  . KNEE CARTILAGE SURGERY     right  . LUMBAR SPINE SURGERY     "put box in my lower back"  . LUNG SURGERY     "made 3 holes and pulled gel out"  . VAGINAL HYSTERECTOMY     HPI:  Amanda Hogan is a 86 y.o. female with a history of atrial fibrillation with RVR and as noted below who presents from [place]. She is reportedly more altered from her baseline dementia. Much is  unknown about pt's baseline diet or baseline abilities d/t low family involvement in pt's care at [facility]. Chart does reveal that pt is largely nonverbal at baseline and is completely nonverbal in unfamiliar environments. Pt is currently receiving Palliative Care services at her facility. Palliative Care's latest note does state that pt frequently refuses her medicine and experiences leg pain as a result.    Assessment / Plan / Recommendation Clinical Impression  Pt has many aspiration risk factors including but not limited to cognitive deficits related to advanced dementia, inability to maintain alertness, inability to understand or maintain upright positioning for PO intake. During this evaluation, pt was poorly positioned in bed and was resistance to repositioning. She was also minimally awake and nonverbal throughout. With maximal assistance, pt did demonstrate a few moments of increased alertness and consumed 2 small sips of thin liquids via straw. Pt was free of overt s/s of aspiration. Pt's nurse provided that pt refused her medicine's this morning by pushing them away (presented crushed in applesauce). Refusal to take medicine is a baseline behavior per chart review. Out of an abundance of caution, given pt's high aspiration risk from multiple comorbidities, recommend dysphagia 1 diet  with thin liquids via cup or straw and medicine crushed in sherbet or puree. Pt MUST BE AWAKE, ALERT, POSITIONED UPRIGHT when eating or drinking. ST to follow up in next 2-3 days. Given that pt has history of refusing medicine, recommend Palliative Care consult for assistance with GOC.  SLP Visit Diagnosis: Dysphagia, unspecified (R13.10)    Aspiration Risk  Severe aspiration risk;Risk for inadequate nutrition/hydration    Diet Recommendation Dysphagia 1 (Puree);Thin liquid   Liquid Administration via: Cup;Straw Medication Administration: Crushed with puree Supervision: Staff to assist with self  feeding;Full supervision/cueing for compensatory strategies Compensations: Minimize environmental distractions;Slow rate;Small sips/bites Postural Changes: Seated upright at 90 degrees;Remain upright for at least 30 minutes after po intake    Other  Recommendations Oral Care Recommendations: Oral care BID   Follow up Recommendations  (Palliative Care consult)      Frequency and Duration min 2x/week  2 weeks       Prognosis Prognosis for Safe Diet Advancement: Fair Barriers to Reach Goals: Cognitive deficits;Severity of deficits      Swallow Study   General Date of Onset: 01/24/20 HPI: Amanda Hogan is a 86 y.o. female with a history of atrial fibrillation with RVR and as noted below who presents from [place]. She is reportedly more altered from her baseline dementia. Much is unknown about pt's baseline diet or baseline abilities d/t low family involvement in pt's care at [facility]. Chart does reveal that pt is largely nonverbal at baseline and is completely nonverbal in unfamiliar environments. Pt is currently receiving Palliative Care services at her facility. Palliative Care's latest note does state that pt frequently refuses her medicine and experiences leg pain as a result.  Type of Study: Bedside Swallow Evaluation Previous Swallow Assessment: none in chart Diet Prior to this Study: Regular;Thin liquids Temperature Spikes Noted: No Respiratory Status: Room air History of Recent Intubation: No Behavior/Cognition: Confused;Doesn't follow directions;Lethargic/Drowsy Oral Cavity Assessment: Within Functional Limits Oral Care Completed by SLP: Recent completion by staff Oral Cavity - Dentition: Adequate natural dentition Vision: Impaired for self-feeding Self-Feeding Abilities: Total assist Patient Positioning: Upright in bed Baseline Vocal Quality: Not observed Volitional Cough: Cognitively unable to elicit Volitional Swallow: Unable to elicit     Oral/Motor/Sensory Function Overall Oral Motor/Sensory Function:  (limited by pt's cognitive deficits/alertness level)   Ice Chips Ice chips: Not tested   Thin Liquid Thin Liquid: Within functional limits Presentation: Straw Other Comments:  (minimal consumption)    Nectar Thick Nectar Thick Liquid: Not tested   Honey Thick Honey Thick Liquid: Not tested   Puree Puree: Not tested (d/t cognitive deficits/alertness)   Solid     Solid: Not tested (d/t pt's cognitive deficits and level of alertness)     Amanda Hogan B. Rutherford Nail, M.S., CCC-SLP, [place] Office [phone]  Aveah Castell Rutherford Nail 01/24/2020,2:37 PM

## 2020-01-24 NOTE — Progress Notes (Addendum)
PROGRESS NOTE    Amanda Hogan  NFA:[id number] DOB: 1928-05-28 DOA: 01/23/2020 PCP: Orvis Brill, [facility]   Assessment & Plan:   Active Problems:   Sepsis ([place])   SIRS: w/ possible sepsis, no source of infection at this time. HR >100, RR>100, elevated lactic acid on admission day. Blood cxs NGTD. [place] abd shows s/p cholecystectomy, hepatic steatosis, mild ascites & left renal atrophy. Continue on IV rocephin, flagyl. D/c IV vanco    AMS: etiology unclear, possible acute metabolic encephalopathy vs delirium vs progressive dementia. Hx of TIA. CT head shows no acute intracranial abnormalities but old infarcts & atrophy present.   A. Fib: likely PAF. Not on anticoagulation secondary to high fall & bleeding risk.  Likely AKI on CKD: baseline Cr is unknown but around 1.1 recently as per EMR review. Cr is trending up from day prior   Hypertension: continue on amlodipine, metoprolol, spironolactone    Hyperlipidemia: continue on statin   Chronic diastolic CHF: continue on home dose of metoprolol, spironolactone, lasix.  Bilateral lower extremity pain: appears chronic.  Bilateral inguinal rash: present on admission. Continue w/ nystatin powder.    Dementia: continue on home dose of memantine, quetiapine   Depression: severity unknown. Continue on home dose of escitalpram  Anxiety: severity unknown. Continue on ativan prn   Restless leg: continue on home dose of ropinirole    DVT prophylaxis: heparin  Code Status: DNR Family Communication: discussed pt's care with pt's daughter and answered her questions. Pt's daughter does not want pt returning to [facility] at d/c Disposition Plan: depends on PT/OT recs  Status is: Inpatient  Remains inpatient appropriate because:Ongoing diagnostic testing needed not appropriate for outpatient work up, Unsafe d/c plan and IV treatments appropriate due to intensity of illness or inability to take PO   Dispo: The patient  is from: SNF              Anticipated d/c is to: SNF              Anticipated d/c date is: 2 days              Patient currently is not medically stable to d/c.     Consultants:      Procedures:   Antimicrobials: flagyl, ceftriaxone    Subjective: Pt c/o pain but unable to verbalize where and/or how severe.  Objective: Vitals:   01/23/20 2218 01/24/20 0300 01/24/20 0411 01/24/20 0810  BP: 113/67  114/81 129/68  Pulse: (!) 102  80 (!) 107  Resp: 16  19 16   Temp: (!) 97.1 F (36.2 C)  97.7 F (36.5 C) 98.4 F (36.9 C)  TempSrc: Axillary  Oral   SpO2: 99%  92% 95%  Weight: 93.2 kg 93.8 kg    Height: 5\' 6"  (1.676 m)       Intake/Output Summary (Last 24 hours) at 01/24/2020 0818 Last data filed at 01/24/2020 0300 Gross per 24 hour  Intake 396.98 ml  Output --  Net 396.98 ml   Filed Weights   01/23/20 1822 01/23/20 2218 01/24/20 0300  Weight: 89.4 kg 93.2 kg 93.8 kg    Examination:  General exam: Appears calm but uncomfortable. Obese Respiratory system: decreased breath sounds b/l  Cardiovascular system: S1 & S2 +. No rubs, gallops or clicks.  Gastrointestinal system: Abdomen is obese, soft and nontender.  Normal bowel sounds heard. Central nervous system: Alert and awake. Moves all 4 extremities Psychiatry: Judgement and insight appear abnormal. Flat mood and  affect.     Data Reviewed: I have personally reviewed following labs and imaging studies  CBC: Recent Labs  Lab 01/23/20 0929 01/24/20 0537  WBC 9.0 10.1  HGB 11.8* 11.1*  HCT 37.3 36.0  MCV 97.9 99.4  PLT 226 403   Basic Metabolic Panel: Recent Labs  Lab 01/23/20 0929 01/24/20 0537  NA 136 135  K 5.0 5.6*  CL 100 103  CO2 22 16*  GLUCOSE 142* 117*  BUN 71* 66*  CREATININE 2.15* 2.13*  CALCIUM 9.0 8.9   GFR: Estimated Creatinine Clearance: 21.1 mL/min (A) (by C-G formula based on SCr of 2.13 mg/dL (H)). Liver Function Tests: Recent Labs  Lab 01/23/20 0929  AST 46*  ALT 46*   ALKPHOS 98  BILITOT 2.8*  PROT 7.3  ALBUMIN 3.5   No results for input(s): LIPASE, AMYLASE in the last 168 hours. No results for input(s): AMMONIA in the last 168 hours. Coagulation Profile: Recent Labs  Lab 01/23/20 0929 01/24/20 0537  INR 1.5* 1.6*   Cardiac Enzymes: No results for input(s): CKTOTAL, CKMB, CKMBINDEX, TROPONINI in the last 168 hours. BNP (last 3 results) No results for input(s): PROBNP in the last 8760 hours. HbA1C: No results for input(s): HGBA1C in the last 72 hours. CBG: Recent Labs  Lab 01/23/20 0944 01/23/20 2052  GLUCAP 136* 137*   Lipid Profile: No results for input(s): CHOL, HDL, LDLCALC, TRIG, CHOLHDL, LDLDIRECT in the last 72 hours. Thyroid Function Tests: Recent Labs    01/23/20 1728  TSH 0.551   Anemia Panel: No results for input(s): VITAMINB12, FOLATE, FERRITIN, TIBC, IRON, RETICCTPCT in the last 72 hours. Sepsis Labs: Recent Labs  Lab 01/23/20 1229 01/23/20 1728 01/24/20 0537  PROCALCITON  --   --  <0.10  LATICACIDVEN 3.3* 2.8*  --     Recent Results (from the past 240 hour(s))  Culture, blood (single)     Status: None (Preliminary result)   Collection Time: 01/23/20  2:36 PM   Specimen: BLOOD  Result Value Ref Range Status   Specimen Description BLOOD RIGHT ANTECUBITAL  Final   Special Requests   Final    BOTTLES DRAWN AEROBIC AND ANAEROBIC Blood Culture adequate volume   Culture   Final    NO GROWTH < 24 HOURS Performed at [facility], [address] [place], [place] [address]    Report Status PENDING  Incomplete  CULTURE, BLOOD (ROUTINE X 2) w Reflex to ID Panel     Status: None (Preliminary result)   Collection Time: 01/23/20  5:11 PM   Specimen: BLOOD  Result Value Ref Range Status   Specimen Description BLOOD BLOOD LEFT HAND  Final   Special Requests   Final    BOTTLES DRAWN AEROBIC AND ANAEROBIC Blood Culture results may not be optimal due to an inadequate volume of blood received in culture bottles    Culture   Final    NO GROWTH < 12 HOURS Performed at [facility], [address], [place], [place] [address]    Report Status PENDING  Incomplete  CULTURE, BLOOD (ROUTINE X 2) w Reflex to ID Panel     Status: None (Preliminary result)   Collection Time: 01/23/20  5:29 PM   Specimen: BLOOD  Result Value Ref Range Status   Specimen Description BLOOD LEFT ANTECUBITAL  Final   Special Requests   Final    BOTTLES DRAWN AEROBIC AND ANAEROBIC Blood Culture results may not be optimal due to an inadequate volume of  blood received in culture bottles   Culture   Final    NO GROWTH < 12 HOURS Performed at [facility], [address] [place], [place] [address]    Report Status PENDING  Incomplete         Radiology Studies: DG Lumbar Spine 2-3 Views  Result Date: 01/23/2020 CLINICAL DATA:  Low back pain after fall. EXAM: LUMBAR SPINE - 2-3 VIEW COMPARISON:  June 19, 2015. FINDINGS: Status post surgical posterior fusion of L4-5 with bilateral intrapedicular screw placement and interbody fusion. Severe degenerative disc disease is noted at L1-2 and L2-3. No fracture is noted. IMPRESSION: Postsurgical and degenerative changes as described above. No acute abnormality seen in the lumbar spine. Aortic Atherosclerosis (ICD10-I70.0). Electronically Signed   By: Marijo Conception M.D.   On: 01/23/2020 11:26   CT Head Wo Contrast  Result Date: 01/23/2020 CLINICAL DATA:  Altered mental status. Fell twice today. EXAM: CT HEAD WITHOUT CONTRAST TECHNIQUE: Contiguous axial images were obtained from the base of the skull through the vertex without intravenous contrast. 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 No significant change in mild-to-moderate enlargement of the ventricles and subarachnoid spaces. Stable marked patchy white matter low density in both cerebral hemispheres. No intracranial hemorrhage, mass lesion or CT evidence of  acute infarction. Vascular: No hyperdense vessel or unexpected calcification. Skull: Choose 1 Sinuses/Orbits: Status post bilateral cataract extraction. Unremarkable bones and included paranasal sinuses. Other: None. IMPRESSION: 1. No acute abnormality. 2. 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 3. Stable mild-to-moderate diffuse cerebral and cerebellar atrophy. 4. Stable marked chronic small vessel white matter ischemic changes in both cerebral hemispheres. Electronically Signed   By: Claudie Revering M.D.   On: 01/23/2020 11:42   US Abdomen Complete  Result Date: 01/24/2020 CLINICAL DATA:  Abdominal pain. EXAM: ABDOMEN ULTRASOUND COMPLETE COMPARISON:  December 27, 2014.  October 16, 2017. FINDINGS: Gallbladder: Status post cholecystectomy. Common bile duct: Diameter: 4 mm which is within normal limits. Liver: No focal lesion identified. Increased echogenicity of hepatic parenchyma is noted suggesting hepatic steatosis. Portal vein is patent on color Doppler imaging with normal direction of blood flow towards the liver. IVC: No abnormality visualized. Pancreas: Not visualized due to overlying bowel gas. Spleen: Size and appearance within normal limits. Right Kidney: Length: 9.1 cm. Echogenicity within normal limits. No mass or hydronephrosis visualized. Left Kidney: Length: 8.6 cm. Echogenicity within normal limits. No mass or hydronephrosis visualized. Abdominal aorta: Not visualized due to overlying bowel gas. Other findings: Mild ascites is noted around the liver and spleen. IMPRESSION: Status post cholecystectomy. Mildly increased echogenicity of hepatic parenchyma is noted suggesting hepatic steatosis. Pancreas and abdominal aorta are not visualized due to overlying bowel gas. Mild ascites is noted around the liver and spleen. Left renal atrophy is noted. Electronically Signed   By: Marijo Conception M.D.   On: 01/24/2020 08:08   DG Chest Port 1 View  Result Date: 01/23/2020 CLINICAL DATA:  Altered  mental status. EXAM: PORTABLE CHEST 1 VIEW COMPARISON:  July 06, 2019. FINDINGS: Stable cardiomegaly. No pneumothorax is noted. Minimal right pleural effusion is noted. Left lung is clear. Minimal right basilar subsegmental atelectasis is noted. Bony thorax is unremarkable. IMPRESSION: Minimal right pleural effusion. Minimal right basilar subsegmental atelectasis. Electronically Signed   By: Marijo Conception M.D.   On: 01/23/2020 11:24   DG Hip Unilat W or Wo Pelvis 2-3 Views Left  Result Date: 01/23/2020  CLINICAL DATA:  Two falls, dementia, altered mental status, LEFT hip discomfort with palpation EXAM: DG HIP (WITH OR WITHOUT PELVIS) 2-3V LEFT COMPARISON:  01/08/2014 FINDINGS: Scattered motion artifact. Osseous demineralization. Prior lower lumbar fusion. Hip and SI joint spaces preserved. No acute fracture, dislocation, or bone destruction. Minimal spur formation at LEFT femoral head. IMPRESSION: Osseous demineralization with minimal degenerative changes of LEFT hip. No acute abnormalities. Electronically Signed   By: [name] Dana M.D.   On: 01/23/2020 10:43        Scheduled Meds: . amLODipine  2.5 mg Oral Daily  . atorvastatin  40 mg Oral QHS  . escitalopram  5 mg Oral QHS  . furosemide  30 mg Oral Daily  . heparin  5,000 Units Subcutaneous Q8H  . memantine  5 mg Oral Daily  . metoprolol succinate  100 mg Oral Daily  . nystatin   Topical BID  . rOPINIRole  1 mg Oral TID  . senna  2 tablet Oral QHS  . spironolactone  25 mg Oral Daily   Continuous Infusions: . ceFEPime (MAXIPIME) IV    . diltiazem (CARDIZEM) infusion 10 mg/hr (01/24/20 0415)  . metronidazole 500 mg (01/24/20 0555)  . vancomycin       LOS: 0 days    Time spent: 30 mins     Wyvonnia Dusky, MD [facility] Pager [phone]  If 7PM-7AM, please contact night-coverage 01/24/2020, 8:18 AM

## 2020-01-24 NOTE — Progress Notes (Signed)
Pt admitted to unit. VSS. Cardizem gtt infusing at 10 mg/hr. Pt unable to respond to questions but alert. Belongings left at bedside.

## 2020-01-25 DIAGNOSIS — R4182 Altered mental status, unspecified: Secondary | ICD-10-CM | POA: Diagnosis not present

## 2020-01-25 DIAGNOSIS — N179 Acute kidney failure, unspecified: Secondary | ICD-10-CM | POA: Diagnosis not present

## 2020-01-25 LAB — BASIC METABOLIC PANEL
Anion gap: 14 (ref 5–15)
BUN: 75 mg/dL — ABNORMAL HIGH (ref 8–23)
CO2: 19 mmol/L — ABNORMAL LOW (ref 22–32)
Calcium: 8.8 mg/dL — ABNORMAL LOW (ref 8.9–10.3)
Chloride: 104 mmol/L (ref 98–111)
Creatinine, Ser: 2.46 mg/dL — ABNORMAL HIGH (ref 0.44–1.00)
GFR, Estimated: 18 mL/min — ABNORMAL LOW (ref >60)
Glucose, Bld: 111 mg/dL — ABNORMAL HIGH (ref 70–99)
Potassium: 5.2 mmol/L — ABNORMAL HIGH (ref 3.5–5.1)
Sodium: 137 mmol/L (ref 135–145)

## 2020-01-25 LAB — CBC
HCT: 35.5 % — ABNORMAL LOW (ref 36.0–46.0)
Hemoglobin: 11.2 g/dL — ABNORMAL LOW (ref 12.0–15.0)
MCH: 30.9 pg (ref 26.0–34.0)
MCHC: 31.5 g/dL (ref 30.0–36.0)
MCV: 98.1 fL (ref 80.0–100.0)
Platelets: 193 10*3/uL (ref 150–400)
RBC: 3.62 MIL/uL — ABNORMAL LOW (ref 3.87–5.11)
RDW: 17.6 % — ABNORMAL HIGH (ref 11.5–15.5)
WBC: 10.4 10*3/uL (ref 4.0–10.5)
nRBC: 0 % (ref 0.0–0.2)

## 2020-01-25 LAB — LACTIC ACID, PLASMA
Lactic Acid, Venous: 2.7 mmol/L (ref 0.5–1.9)
Lactic Acid, Venous: 2.3 mmol/L (ref 0.5–1.9)

## 2020-01-25 MED ORDER — GLYCOPYRROLATE 0.2 MG/ML IJ SOLN
0.2000 mg | INTRAMUSCULAR | Status: DC | PRN
  Filled 2020-01-25: qty 1

## 2020-01-25 MED ORDER — LORAZEPAM 2 MG/ML PO CONC: 1.0000 mg | ORAL | Status: DC | PRN

## 2020-01-25 MED ORDER — LORAZEPAM 1 MG PO TABS: 1.0000 mg | ORAL_TABLET | ORAL | Status: DC | PRN

## 2020-01-25 MED ORDER — LORAZEPAM 2 MG/ML IJ SOLN
1.0000 mg | INTRAMUSCULAR | Status: DC | PRN
  Administered 2020-01-26 – 2020-01-27 (×6): 1 mg via INTRAVENOUS
  Filled 2020-01-25 (×7): qty 1

## 2020-01-25 MED ORDER — MORPHINE SULFATE (PF) 2 MG/ML IV SOLN
1.0000 mg | INTRAVENOUS | Status: DC | PRN
  Administered 2020-01-26 – 2020-01-27 (×5): 1 mg via INTRAVENOUS
  Filled 2020-01-25 (×5): qty 1

## 2020-01-25 MED ORDER — GLYCOPYRROLATE 1 MG PO TABS
1.0000 mg | ORAL_TABLET | ORAL | Status: DC | PRN
  Filled 2020-01-25: qty 1

## 2020-01-25 NOTE — Progress Notes (Signed)
Night nurse, Holley Dexter, reports during shift report that patient refused any oral medications throughout the night. During shift report, the patient was found to have pulled her wrapped IV out of her arm. No bleeding present. IV Cardizem stopped and a new IV consult has been placed.  When lost IV was attempted to be removed from patient, patient attempted to bite and scratch this nurse, and attempt was unsuccessful.  Patient appears calm when she is left alone.  Dr. Jimmye Norman notified of patient's current agitated condition, medication refusal and loss of IV.

## 2020-01-25 NOTE — Progress Notes (Signed)
Patient is now comfort care measures only. Patient appears to be sleeping peacefully, with no signs of distress or pain. Patient has refused or been unable to arouse for PO intake this shift, except for a few bites of applesauce this a.m. Patient's daughter is currently at bedside. Patient's daughter educated on comfort care measures and offered emotional support.

## 2020-01-25 NOTE — NC FL2 (Signed)
[place] LEVEL OF CARE SCREENING TOOL     IDENTIFICATION  Patient Name: Amanda Hogan Birthdate: 23-Apr-1928 Sex: female Admission Date (Current Location): 01/23/2020  [facility] and [place] Number:  Engineering geologist and Address:  [facility], [address], [place], [place] [address]      Provider Number: [id number]  Attending Physician Name and Address:  Wyvonnia Dusky, MD  Relative Name and Phone Number:  [name] (Daughter) [phone]    Current Level of Care: Hospital Recommended Level of Care: [place] Prior Approval Number:    Date Approved/Denied:   PASRR Number: Pending  Discharge Plan: SNF    Current Diagnoses: Patient Active Problem List   Diagnosis Date Noted  . SIRS (systemic inflammatory response syndrome) ([place]) 01/24/2020  . Sepsis ([place]) 01/23/2020  . Acute kidney injury ([place]) 12/27/2014  . Acute CVA (cerebrovascular accident) ([place]) 12/16/2014  . Diastolic dysfunction-grade 2 10/29/2014  . Pulmonary hypertension-moderate 10/29/2014  . Troponin level elevated-suspect demand ischemia from AF with RVR 10/28/2014  . Lymphoma history 10/28/2014  . Acute diastolic CHF (congestive heart failure) ([place]) 10/27/2014  . Atrial fibrillation with RVR (new)   . TIA (transient ischemic attack) 02/25/2012  . Encephalopathy acute 02/23/2012  . Anemia 02/23/2012  . Chronic back pain 02/23/2012  . Anxiety 02/23/2012  . Mild carotid artery disease ([place]) 08/05/2011  . Memory disorder, possibly not taking home meds at times 07/22/2011  . Hypertension, at times poorly controlled 07/21/2011  . Chest pain-low risk Myoview May 2013 07/21/2011  . Hyperlipidemia 07/21/2011  . Gastroesophageal reflux disease 07/21/2011  . History of TIA (transient ischemic attack) 07/21/2011  . Obesity 07/21/2011    Orientation RESPIRATION BLADDER Height & Weight     Self    Incontinent, External catheter Weight: 205  lb 6.4 oz (93.2 kg) Height:  5\' 6"  (167.6 cm)  BEHAVIORAL SYMPTOMS/MOOD NEUROLOGICAL BOWEL NUTRITION STATUS      Incontinent    AMBULATORY STATUS COMMUNICATION OF NEEDS Skin   Extensive Assist Verbally Other (Comment) ([place] area)                       [place] Level of Assistance  Bathing, Feeding, Dressing, Total care Bathing Assistance: Maximum assistance Feeding assistance: Maximum assistance Dressing Assistance: Maximum assistance Total Care Assistance: Maximum assistance   Functional Limitations Info             SPECIAL CARE FACTORS FREQUENCY  PT (By licensed PT), OT (By licensed OT)     PT Frequency: 5 x weekly OT Frequency: 5 x weekly            Contractures      Additional Factors Info  Allergies, Code Status Code Status Info: DNR Allergies Info: Codeine Sulfate, Iodine           Current Medications (01/25/2020):  This is the current hospital active medication list Current Facility-Administered Medications  Medication Dose Route Frequency Provider Last Rate Last Admin  . 0.9 %  sodium chloride infusion   Intravenous PRN Wyvonnia Dusky, MD   Stopped at 01/24/20 1524  . amLODipine (NORVASC) tablet 2.5 mg  2.5 mg Oral Daily Cox, Amy N, DO      . atorvastatin (LIPITOR) tablet 40 mg  40 mg Oral QHS Cox, Amy N, DO   40 mg at 01/23/20 2101  . cefTRIAXone (ROCEPHIN) 2 g in sodium chloride 0.9 % 100 mL IVPB  2 g Intravenous  Q24H Wyvonnia Dusky, MD 200 mL/hr at 01/25/20 1334 2 g at 01/25/20 1334  . escitalopram (LEXAPRO) tablet 5 mg  5 mg Oral QHS Cox, Amy N, DO   5 mg at 01/23/20 2101  . furosemide (LASIX) tablet 30 mg  30 mg Oral Daily Cox, Amy N, DO      . heparin injection 5,000 Units  5,000 Units Subcutaneous Q8H Cox, Amy N, DO   5,000 Units at 01/25/20 1336  . LORazepam (ATIVAN) tablet 0.5 mg  0.5 mg Oral BID PRN Cox, Amy N, DO   0.5 mg at 01/25/20 0923  . memantine (NAMENDA) tablet 5 mg  5 mg Oral Daily Cox, Amy N, DO   5 mg  at 01/25/20 0911  . metoprolol succinate (TOPROL-XL) 24 hr tablet 100 mg  100 mg Oral Daily Cox, Amy N, DO      . metroNIDAZOLE (FLAGYL) IVPB 500 mg  500 mg Intravenous Q8H Cox, Amy N, DO 100 mL/hr at 01/25/20 0907 500 mg at 01/25/20 0907  . morphine 2 MG/ML injection 1 mg  1 mg Intravenous Q4H PRN Wyvonnia Dusky, MD   1 mg at 01/24/20 1145  . nystatin (MYCOSTATIN/NYSTOP) topical powder   Topical BID Cox, Amy N, DO   Given at 01/25/20 0910  . QUEtiapine (SEROQUEL) tablet 12.5 mg  12.5 mg Oral BID PRN Cox, Amy N, DO      . rOPINIRole (REQUIP) tablet 1 mg  1 mg Oral TID Cox, Amy N, DO   1 mg at 01/25/20 0911  . senna (SENOKOT) tablet 17.2 mg  2 tablet Oral QHS Cox, Amy N, DO   17.2 mg at 01/23/20 2101     Discharge Medications: Please see discharge summary for a list of discharge medications.  Relevant Imaging Results:  Relevant Lab Results:   Additional Information VO#[id number]  Meriel Flavors, LCSW

## 2020-01-25 NOTE — Progress Notes (Signed)
PROGRESS NOTE    Amanda Hogan  ZOX:[id number] DOB: February 24, 1929 DOA: 01/23/2020 PCP: Orvis Brill, [facility]   Assessment & Plan:   Active Problems:   Sepsis ([place])   SIRS (systemic inflammatory response syndrome) (H[facility])  Failure to thrive: secondary to all below. Proceed w/ comfort care only. Will consult hospice on 01/27/20 as they do not see pts here over the weekend  SIRS: w/ possible sepsis, no source of infection at this time. HR >100, RR>100, elevated lactic acid on admission day. Blood cxs NGTD. [place] abd shows s/p cholecystectomy, hepatic steatosis, mild ascites & left renal atrophy. D/c IV abxs as pt was made comfort care only   AMS: etiology unclear,likely secondary to progressive dementia. Hx of TIA. CT head shows no acute intracranial abnormalities but old infarcts & atrophy present.   A. Fib: likely PAF. Not on anticoagulation secondary to high fall & bleeding risk.  Likely AKI on CKD: baseline Cr is unknown but around 1.1 recently as per EMR review. Cr is trending up from day prior   Hypertension: will d/c all anti-HTN meds  Hyperlipidemia: d/c statin   Chronic diastolic CHF: will d/c lasix   Bilateral lower extremity pain: appears chronic.  Bilateral inguinal rash: present on admission. Continue w/ nystatin powder.    Dementia: continue on home dose of memantine, quetiapine   Depression: severity unknown. Continue on home dose of escitalpram  Anxiety: severity unknown. Continue on ativan prn   Restless leg: continue on home dose of ropinirole    DVT prophylaxis: heparin  Code Status: DNR Family Communication: discussed pt's care with pt's daughter and answered her questions. After a discussion w/ pt's daughter will proceed w/ comfort care only. Will consult hospice on 01/27/20. Disposition Plan:  Unknown, will consult hospice on 01/27/20 as they do not see pts here over the weekend   Status is: Inpatient  Remains inpatient appropriate  because:Ongoing diagnostic testing needed not appropriate for outpatient work up, Unsafe d/c plan and IV treatments appropriate due to intensity of illness or inability to take PO   Dispo: The patient is from: SNF              Anticipated d/c is to: SNF vs hospice facility               Anticipated d/c date is: 2 days              Patient currently is not medically stable to d/c.     Consultants:      Procedures:   Antimicrobials: flagyl, ceftriaxone    Subjective: Pt is unable to verbalize complaints   Objective: Vitals:   01/24/20 2017 01/25/20 0056 01/25/20 0544 01/25/20 0549  BP: (!) 112/95 105/66 (!) 119/91   Pulse: 77 (!) 113 92   Resp:      Temp: 97.7 F (36.5 C) (!) 97.4 F (36.3 C) (!) 97.5 F (36.4 C)   TempSrc: Oral Oral Oral   SpO2: 95%  95%   Weight:    93.2 kg  Height:        Intake/Output Summary (Last 24 hours) at 01/25/2020 0833 Last data filed at 01/24/2020 1536 Gross per 24 hour  Intake 343.06 ml  Output --  Net 343.06 ml   Filed Weights   01/23/20 2218 01/24/20 0300 01/25/20 0549  Weight: 93.2 kg 93.8 kg 93.2 kg    Examination:  General exam: appears calm but uncomfortable. Obese Respiratory system: diminished breath sounds b/l  Cardiovascular system: S1 &  S2 +. No rubs, gallops or clicks.  Gastrointestinal system: Abdomen is obese, soft and nontender.  Normal bowel sounds heard. Central nervous system: Appears lethargic. Moves all extremities  Psychiatry: Judgement and insight appear abnormal. Flat mood and affect    Data Reviewed: I have personally reviewed following labs and imaging studies  CBC: Recent Labs  Lab 01/23/20 0929 01/24/20 0537  WBC 9.0 10.1  HGB 11.8* 11.1*  HCT 37.3 36.0  MCV 97.9 99.4  PLT 226 413   Basic Metabolic Panel: Recent Labs  Lab 01/23/20 0929 01/24/20 0537  NA 136 135  K 5.0 5.6*  CL 100 103  CO2 22 16*  GLUCOSE 142* 117*  BUN 71* 66*  CREATININE 2.15* 2.13*  CALCIUM 9.0 8.9    GFR: Estimated Creatinine Clearance: 21 mL/min (A) (by C-G formula based on SCr of 2.13 mg/dL (H)). Liver Function Tests: Recent Labs  Lab 01/23/20 0929  AST 46*  ALT 46*  ALKPHOS 98  BILITOT 2.8*  PROT 7.3  ALBUMIN 3.5   No results for input(s): LIPASE, AMYLASE in the last 168 hours. No results for input(s): AMMONIA in the last 168 hours. Coagulation Profile: Recent Labs  Lab 01/23/20 0929 01/24/20 0537  INR 1.5* 1.6*   Cardiac Enzymes: No results for input(s): CKTOTAL, CKMB, CKMBINDEX, TROPONINI in the last 168 hours. BNP (last 3 results) No results for input(s): PROBNP in the last 8760 hours. HbA1C: No results for input(s): HGBA1C in the last 72 hours. CBG: Recent Labs  Lab 01/23/20 0944 01/23/20 2052  GLUCAP 136* 137*   Lipid Profile: No results for input(s): CHOL, HDL, LDLCALC, TRIG, CHOLHDL, LDLDIRECT in the last 72 hours. Thyroid Function Tests: Recent Labs    01/23/20 1728  TSH 0.551   Anemia Panel: No results for input(s): VITAMINB12, FOLATE, FERRITIN, TIBC, IRON, RETICCTPCT in the last 72 hours. Sepsis Labs: Recent Labs  Lab 01/23/20 1229 01/23/20 1728 01/24/20 0537 01/24/20 0955 01/24/20 1253  PROCALCITON  --   --  <0.10  --   --   LATICACIDVEN 3.3* 2.8*  --  2.6* 2.4*    Recent Results (from the past 240 hour(s))  Culture, blood (single)     Status: None (Preliminary result)   Collection Time: 01/23/20  2:36 PM   Specimen: BLOOD  Result Value Ref Range Status   Specimen Description BLOOD RIGHT ANTECUBITAL  Final   Special Requests   Final    BOTTLES DRAWN AEROBIC AND ANAEROBIC Blood Culture adequate volume   Culture   Final    NO GROWTH 2 DAYS Performed at [facility], [address] [place], [place] [address]    Report Status PENDING  Incomplete  CULTURE, BLOOD (ROUTINE X 2) w Reflex to ID Panel     Status: None (Preliminary result)   Collection Time: 01/23/20  5:11 PM   Specimen: BLOOD  Result Value Ref Range  Status   Specimen Description BLOOD BLOOD LEFT HAND  Final   Special Requests   Final    BOTTLES DRAWN AEROBIC AND ANAEROBIC Blood Culture results may not be optimal due to an inadequate volume of blood received in culture bottles   Culture   Final    NO GROWTH 2 DAYS Performed at [facility], [address] [place], [place] [address]    Report Status PENDING  Incomplete  CULTURE, BLOOD (ROUTINE X 2) w Reflex to ID Panel     Status: None (Preliminary result)   Collection Time: 01/23/20  5:29  PM   Specimen: BLOOD  Result Value Ref Range Status   Specimen Description BLOOD LEFT ANTECUBITAL  Final   Special Requests   Final    BOTTLES DRAWN AEROBIC AND ANAEROBIC Blood Culture results may not be optimal due to an inadequate volume of blood received in culture bottles   Culture   Final    NO GROWTH 2 DAYS Performed at [facility], [address], [place], [place] [address]    Report Status PENDING  Incomplete         Radiology Studies: DG Lumbar Spine 2-3 Views  Result Date: 01/23/2020 CLINICAL DATA:  Low back pain after fall. EXAM: LUMBAR SPINE - 2-3 VIEW COMPARISON:  June 19, 2015. FINDINGS: Status post surgical posterior fusion of L4-5 with bilateral intrapedicular screw placement and interbody fusion. Severe degenerative disc disease is noted at L1-2 and L2-3. No fracture is noted. IMPRESSION: Postsurgical and degenerative changes as described above. No acute abnormality seen in the lumbar spine. Aortic Atherosclerosis (ICD10-I70.0). Electronically Signed   By: Marijo Conception M.D.   On: 01/23/2020 11:26   CT Head Wo Contrast  Result Date: 01/23/2020 CLINICAL DATA:  Altered mental status. Fell twice today. EXAM: CT HEAD WITHOUT CONTRAST TECHNIQUE: Contiguous axial images were obtained from the base of the skull through the vertex without intravenous contrast. 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 No  significant change in mild-to-moderate enlargement of the ventricles and subarachnoid spaces. Stable marked patchy white matter low density in both cerebral hemispheres. No intracranial hemorrhage, mass lesion or CT evidence of acute infarction. Vascular: No hyperdense vessel or unexpected calcification. Skull: Choose 1 Sinuses/Orbits: Status post bilateral cataract extraction. Unremarkable bones and included paranasal sinuses. Other: None. IMPRESSION: 1. No acute abnormality. 2. 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 3. Stable mild-to-moderate diffuse cerebral and cerebellar atrophy. 4. Stable marked chronic small vessel white matter ischemic changes in both cerebral hemispheres. Electronically Signed   By: Claudie Revering M.D.   On: 01/23/2020 11:42   US Abdomen Complete  Result Date: 01/24/2020 CLINICAL DATA:  Abdominal pain. EXAM: ABDOMEN ULTRASOUND COMPLETE COMPARISON:  December 27, 2014.  October 16, 2017. FINDINGS: Gallbladder: Status post cholecystectomy. Common bile duct: Diameter: 4 mm which is within normal limits. Liver: No focal lesion identified. Increased echogenicity of hepatic parenchyma is noted suggesting hepatic steatosis. Portal vein is patent on color Doppler imaging with normal direction of blood flow towards the liver. IVC: No abnormality visualized. Pancreas: Not visualized due to overlying bowel gas. Spleen: Size and appearance within normal limits. Right Kidney: Length: 9.1 cm. Echogenicity within normal limits. No mass or hydronephrosis visualized. Left Kidney: Length: 8.6 cm. Echogenicity within normal limits. No mass or hydronephrosis visualized. Abdominal aorta: Not visualized due to overlying bowel gas. Other findings: Mild ascites is noted around the liver and spleen. IMPRESSION: Status post cholecystectomy. Mildly increased echogenicity of hepatic parenchyma is noted suggesting hepatic steatosis. Pancreas and abdominal aorta are not visualized due to overlying bowel gas.  Mild ascites is noted around the liver and spleen. Left renal atrophy is noted. Electronically Signed   By: Marijo Conception M.D.   On: 01/24/2020 08:08   DG Chest Port 1 View  Result Date: 01/23/2020 CLINICAL DATA:  Altered mental status. EXAM: PORTABLE CHEST 1 VIEW COMPARISON:  July 06, 2019. FINDINGS: Stable cardiomegaly. No pneumothorax is noted. Minimal right pleural effusion is noted. Left lung is clear. Minimal right basilar subsegmental atelectasis  is noted. Bony thorax is unremarkable. IMPRESSION: Minimal right pleural effusion. Minimal right basilar subsegmental atelectasis. Electronically Signed   By: Marijo Conception M.D.   On: 01/23/2020 11:24   ECHOCARDIOGRAM COMPLETE  Result Date: 01/24/2020    ECHOCARDIOGRAM REPORT   Patient Name:   Amanda Hogan Date of Exam: 01/24/2020 Medical Rec #:  [id number]       Height:       66.0 in Accession #:    [id number]      Weight:       206.8 lb Date of Birth:  11-16-1928      BSA:          2.028 m Patient Age:    66 years        BP:           133/64 mmHg Patient Gender: F               HR:           105 bpm. Exam Location:  [facility] Procedure: 2D Echo, Cardiac Doppler and Color Doppler Indications:     Atrial fibrillation 427.31  History:         Patient has prior history of Echocardiogram examinations, most                  recent 10/28/2014. TIA and Stroke, Signs/Symptoms:Murmur; Risk                  Factors:Hypertension.  Sonographer:     Sherrie Sport RDCS (AE) Referring Phys:  [id number] AMY N [facility] Diagnosing Phys: Neoma Laming MD  Sonographer Comments: Technically difficult study due to poor echo windows, no subcostal window, no apical window and suboptimal parasternal window. Image acquisition challenging due to uncooperative patient. IMPRESSIONS  1. Left ventricular ejection fraction, by estimation, is 60 to 65%. The left ventricle has normal function. The left ventricle has no regional wall motion abnormalities. Left ventricular diastolic function could not be  evaluated.  2. Right ventricular systolic function is normal. The right ventricular size is normal.  3. Left atrial size was moderately dilated.  4. Right atrial size was moderately dilated.  5. The mitral valve was not assessed. No evidence of mitral valve regurgitation. No evidence of mitral stenosis.  6. The aortic valve was not assessed. Aortic valve regurgitation is not visualized. No aortic stenosis is present.  7. The inferior vena cava is normal in size with greater than 50% respiratory variability, suggesting right atrial pressure of 3 mmHg. FINDINGS  Left Ventricle: Left ventricular ejection fraction, by estimation, is 60 to 65%. The left ventricle has normal function. The left ventricle has no regional wall motion abnormalities. The left ventricular internal cavity size was normal in size. There is  no left ventricular hypertrophy. Left ventricular diastolic function could not be evaluated. Right Ventricle: The right ventricular size is normal. No increase in right ventricular wall thickness. Right ventricular systolic function is normal. Left Atrium: Left atrial size was moderately dilated. Right Atrium: Right atrial size was moderately dilated. Pericardium: There is no evidence of pericardial effusion. Mitral Valve: The mitral valve was not assessed. No evidence of mitral valve regurgitation. No evidence of mitral valve stenosis. Tricuspid Valve: The tricuspid valve is not assessed. Tricuspid valve regurgitation is not demonstrated. No evidence of tricuspid stenosis. Aortic Valve: The aortic valve was not assessed. Aortic valve regurgitation is not visualized. No aortic stenosis is present. Pulmonic Valve: The pulmonic valve was not assessed. Pulmonic  valve regurgitation is not visualized. No evidence of pulmonic stenosis. Aorta: The aortic root is normal in size and structure. Venous: The inferior vena cava is normal in size with greater than 50% respiratory variability, suggesting right atrial  pressure of 3 mmHg. IAS/Shunts: No atrial level shunt detected by color flow Doppler.  LEFT VENTRICLE PLAX 2D LVIDd:         3.71 cm LVIDs:         3.52 cm LV PW:         1.04 cm LV IVS:        0.89 cm LVOT diam:     2.00 cm LVOT Area:     3.14 cm  LEFT ATRIUM         Index LA diam:    3.30 cm 1.63 cm/m   AORTA Ao Root diam: 1.90 cm  SHUNTS Systemic Diam: 2.00 cm Neoma Laming MD Electronically signed by Neoma Laming MD Signature Date/Time: 01/24/2020/1:21:14 PM    Final    DG Hip Unilat W or Wo Pelvis 2-3 Views Left  Result Date: 01/23/2020 CLINICAL DATA:  Two falls, dementia, altered mental status, LEFT hip discomfort with palpation EXAM: DG HIP (WITH OR WITHOUT PELVIS) 2-3V LEFT COMPARISON:  01/08/2014 FINDINGS: Scattered motion artifact. Osseous demineralization. Prior lower lumbar fusion. Hip and SI joint spaces preserved. No acute fracture, dislocation, or bone destruction. Minimal spur formation at LEFT femoral head. IMPRESSION: Osseous demineralization with minimal degenerative changes of LEFT hip. No acute abnormalities. Electronically Signed   By: [name] Dana M.D.   On: 01/23/2020 10:43        Scheduled Meds: . amLODipine  2.5 mg Oral Daily  . atorvastatin  40 mg Oral QHS  . escitalopram  5 mg Oral QHS  . furosemide  30 mg Oral Daily  . heparin  5,000 Units Subcutaneous Q8H  . memantine  5 mg Oral Daily  . metoprolol succinate  100 mg Oral Daily  . nystatin   Topical BID  . rOPINIRole  1 mg Oral TID  . senna  2 tablet Oral QHS   Continuous Infusions: . sodium chloride Stopped (01/24/20 1524)  . cefTRIAXone (ROCEPHIN)  IV Stopped (01/24/20 1509)  . diltiazem (CARDIZEM) infusion 10 mg/hr (01/24/20 1849)  . metronidazole 500 mg (01/25/20 0208)     LOS: 1 day    Time spent: 33 mins     Wyvonnia Dusky, MD [facility] Pager [phone]  If 7PM-7AM, please contact night-coverage 01/25/2020, 8:33 AM

## 2020-01-25 NOTE — Progress Notes (Signed)
Cardizem drip discontinued. Patient appears to be asleep, resting quietly with rate stable afib. Patient's current heart rate is 70.

## 2020-01-26 DIAGNOSIS — R6510 Systemic inflammatory response syndrome (SIRS) of non-infectious origin without acute organ dysfunction: Secondary | ICD-10-CM | POA: Diagnosis not present

## 2020-01-26 DIAGNOSIS — R627 Adult failure to thrive: Secondary | ICD-10-CM

## 2020-01-26 DIAGNOSIS — G20 Parkinson's disease: Secondary | ICD-10-CM

## 2020-01-26 DIAGNOSIS — F0281 Dementia in other diseases classified elsewhere with behavioral disturbance: Secondary | ICD-10-CM

## 2020-01-26 NOTE — Progress Notes (Signed)
Patient unable to take oral medications this morning. Lethargic but arousable. Family at bedside. Comfort Cart delivered to patient's room this morning. Continuing to monitor patient's comfort level for PRN medication intervention.

## 2020-01-26 NOTE — Progress Notes (Signed)
PROGRESS NOTE    Amanda Hogan  SWN:[id number] DOB: Jul 27, 1928 DOA: 01/23/2020 PCP: Orvis Brill, [facility]   Assessment & Plan:   Active Problems:   Sepsis ([place])   SIRS (systemic inflammatory response syndrome) (HCC)  Failure to thrive: secondary to all below. Proceed w/ comfort care only. Will consult hospice on 01/27/20 as they do not see pts here over the weekend  SIRS: w/ possible sepsis, no source of infection at this time. HR >100, RR>100, elevated lactic acid on admission day. Blood cxs NGTD. [place] abd shows s/p cholecystectomy, hepatic steatosis, mild ascites & left renal atrophy. D/c IV abxs as pt was made comfort care only   AMS: etiology unclear,likely secondary to progressive dementia. Hx of TIA. CT head shows no acute intracranial abnormalities but old infarcts & atrophy present.   A. Fib: likely PAF. Not on anticoagulation secondary to high fall & bleeding risk.  Likely AKI on CKD: baseline Cr is unknown but around 1.1 recently as per EMR review. Cr is trending up from day prior   Hypertension: will d/c all anti-HTN meds  Hyperlipidemia: d/c statin   Chronic diastolic CHF: will d/c lasix   Bilateral lower extremity pain: appears chronic.  Bilateral inguinal rash: present on admission. Continue w/ nystatin powder.    Dementia: continue on home dose of memantine, quetiapine   Depression: severity unknown. Continue on home dose of escitalpram  Anxiety: severity unknown. Continue on ativan prn   Restless leg: continue on home dose of ropinirole    DVT prophylaxis: heparin  Code Status: DNR Family Communication: called pt's daughter no answer but I left a message. Discussed pt's care w/ pt's son at bedside and answered his questions  Disposition Plan:  Unknown, will consult hospice on 01/27/20 as they do not see pts here over the weekend   Status is: Inpatient  Remains inpatient appropriate because:Ongoing diagnostic testing needed not appropriate  for outpatient work up, Unsafe d/c plan and IV treatments appropriate due to intensity of illness or inability to take PO   Dispo: The patient is from: SNF              Anticipated d/c is to: SNF vs hospice facility               Anticipated d/c date is: 2 days              Patient currently is not medically stable to d/c.     Consultants:      Procedures:   Antimicrobials: flagyl, ceftriaxone    Subjective: Pt was sleeping comfortably   Objective: Vitals:   01/25/20 1213 01/25/20 1704 01/25/20 2228 01/26/20 0031  BP: 123/71 133/76 (!) 93/55 127/68  Pulse: 83 73 68 92  Resp:   18 18  Temp:  (!) 97.4 F (36.3 C) (!) 97.5 F (36.4 C) 97.7 F (36.5 C)  TempSrc:  Oral Oral Oral  SpO2: 93%  93% 94%  Weight:      Height:       No intake or output data in the 24 hours ending 01/26/20 0745 Filed Weights   01/23/20 2218 01/24/20 0300 01/25/20 0549  Weight: 93.2 kg 93.8 kg 93.2 kg    Examination:  General exam: Appears calm & comfortable  Respiratory system: course breath sounds b/l  Cardiovascular system: S1 & S2 +. No rubs, gallops or clicks.  Gastrointestinal system: Abdomen is obese, soft and nontender. Hypoactive bowel sounds heard. Central nervous system: Asleep. Did not do  Psychiatry: Judgement and insight appear abnormal.     Data Reviewed: I have personally reviewed following labs and imaging studies  CBC: Recent Labs  Lab 01/23/20 0929 01/24/20 0537 01/25/20 0846  WBC 9.0 10.1 10.4  HGB 11.8* 11.1* 11.2*  HCT 37.3 36.0 35.5*  MCV 97.9 99.4 98.1  PLT 226 204 509   Basic Metabolic Panel: Recent Labs  Lab 01/23/20 0929 01/24/20 0537 01/25/20 0846  NA 136 135 137  K 5.0 5.6* 5.2*  CL 100 103 104  CO2 22 16* 19*  GLUCOSE 142* 117* 111*  BUN 71* 66* 75*  CREATININE 2.15* 2.13* 2.46*  CALCIUM 9.0 8.9 8.8*   GFR: Estimated Creatinine Clearance: 18.2 mL/min (A) (by C-G formula based on SCr of 2.46 mg/dL (H)). Liver Function  Tests: Recent Labs  Lab 01/23/20 0929  AST 46*  ALT 46*  ALKPHOS 98  BILITOT 2.8*  PROT 7.3  ALBUMIN 3.5   No results for input(s): LIPASE, AMYLASE in the last 168 hours. No results for input(s): AMMONIA in the last 168 hours. Coagulation Profile: Recent Labs  Lab 01/23/20 0929 01/24/20 0537  INR 1.5* 1.6*   Cardiac Enzymes: No results for input(s): CKTOTAL, CKMB, CKMBINDEX, TROPONINI in the last 168 hours. BNP (last 3 results) No results for input(s): PROBNP in the last 8760 hours. HbA1C: No results for input(s): HGBA1C in the last 72 hours. CBG: Recent Labs  Lab 01/23/20 0944 01/23/20 2052  GLUCAP 136* 137*   Lipid Profile: No results for input(s): CHOL, HDL, LDLCALC, TRIG, CHOLHDL, LDLDIRECT in the last 72 hours. Thyroid Function Tests: Recent Labs    01/23/20 1728  TSH 0.551   Anemia Panel: No results for input(s): VITAMINB12, FOLATE, FERRITIN, TIBC, IRON, RETICCTPCT in the last 72 hours. Sepsis Labs: Recent Labs  Lab 01/23/20 1728 01/24/20 0537 01/24/20 0955 01/24/20 1253 01/25/20 0846 01/25/20 1140  PROCALCITON  --  <0.10  --   --   --   --   LATICACIDVEN   < >  --  2.6* 2.4* 2.7* 2.3*   < > = values in this interval not displayed.    Recent Results (from the past 240 hour(s))  Culture, blood (single)     Status: None (Preliminary result)   Collection Time: 01/23/20  2:36 PM   Specimen: BLOOD  Result Value Ref Range Status   Specimen Description BLOOD RIGHT ANTECUBITAL  Final   Special Requests   Final    BOTTLES DRAWN AEROBIC AND ANAEROBIC Blood Culture adequate volume   Culture   Final    NO GROWTH 2 DAYS Performed at [facility], [address] [place], [place] [address]    Report Status PENDING  Incomplete  CULTURE, BLOOD (ROUTINE X 2) w Reflex to ID Panel     Status: None (Preliminary result)   Collection Time: 01/23/20  5:11 PM   Specimen: BLOOD  Result Value Ref Range Status   Specimen Description BLOOD BLOOD LEFT HAND   Final   Special Requests   Final    BOTTLES DRAWN AEROBIC AND ANAEROBIC Blood Culture results may not be optimal due to an inadequate volume of blood received in culture bottles   Culture   Final    NO GROWTH 2 DAYS Performed at [facility], [address] [place], [place] [address]    Report Status PENDING  Incomplete  CULTURE, BLOOD (ROUTINE X 2) w Reflex to ID Panel     Status: None (Preliminary result)   Collection  Time: 01/23/20  5:29 PM   Specimen: BLOOD  Result Value Ref Range Status   Specimen Description BLOOD LEFT ANTECUBITAL  Final   Special Requests   Final    BOTTLES DRAWN AEROBIC AND ANAEROBIC Blood Culture results may not be optimal due to an inadequate volume of blood received in culture bottles   Culture   Final    NO GROWTH 2 DAYS Performed at [facility], [address] [place], [place] [address]    Report Status PENDING  Incomplete         Radiology Studies: US Abdomen Complete  Result Date: 01/24/2020 CLINICAL DATA:  Abdominal pain. EXAM: ABDOMEN ULTRASOUND COMPLETE COMPARISON:  December 27, 2014.  October 16, 2017. FINDINGS: Gallbladder: Status post cholecystectomy. Common bile duct: Diameter: 4 mm which is within normal limits. Liver: No focal lesion identified. Increased echogenicity of hepatic parenchyma is noted suggesting hepatic steatosis. Portal vein is patent on color Doppler imaging with normal direction of blood flow towards the liver. IVC: No abnormality visualized. Pancreas: Not visualized due to overlying bowel gas. Spleen: Size and appearance within normal limits. Right Kidney: Length: 9.1 cm. Echogenicity within normal limits. No mass or hydronephrosis visualized. Left Kidney: Length: 8.6 cm. Echogenicity within normal limits. No mass or hydronephrosis visualized. Abdominal aorta: Not visualized due to overlying bowel gas. Other findings: Mild ascites is noted around the liver and spleen. IMPRESSION: Status post cholecystectomy.  Mildly increased echogenicity of hepatic parenchyma is noted suggesting hepatic steatosis. Pancreas and abdominal aorta are not visualized due to overlying bowel gas. Mild ascites is noted around the liver and spleen. Left renal atrophy is noted. Electronically Signed   By: Marijo Conception M.D.   On: 01/24/2020 08:08   ECHOCARDIOGRAM COMPLETE  Result Date: 01/24/2020    ECHOCARDIOGRAM REPORT   Patient Name:   Amanda Hogan Date of Exam: 01/24/2020 Medical Rec #:  [id number]       Height:       66.0 in Accession #:    [id number]      Weight:       206.8 lb Date of Birth:  [birth year]      BSA:          2.028 m Patient Age:    41 years        BP:           133/64 mmHg Patient Gender: F               HR:           105 bpm. Exam Location:  [facility] Procedure: 2D Echo, Cardiac Doppler and Color Doppler Indications:     Atrial fibrillation 427.31  History:         Patient has prior history of Echocardiogram examinations, most                  recent 10/28/2014. TIA and Stroke, Signs/Symptoms:Murmur; Risk                  Factors:Hypertension.  Sonographer:     Sherrie Sport RDCS (AE) Referring Phys:  [id number] AMY N [facility] Diagnosing Phys: Neoma Laming MD  Sonographer Comments: Technically difficult study due to poor echo windows, no subcostal window, no apical window and suboptimal parasternal window. Image acquisition challenging due to uncooperative patient. IMPRESSIONS  1. Left ventricular ejection fraction, by estimation, is 60 to 65%. The left ventricle has normal function. The left ventricle has no regional wall motion abnormalities.  Left ventricular diastolic function could not be evaluated.  2. Right ventricular systolic function is normal. The right ventricular size is normal.  3. Left atrial size was moderately dilated.  4. Right atrial size was moderately dilated.  5. The mitral valve was not assessed. No evidence of mitral valve regurgitation. No evidence of mitral stenosis.  6. The aortic valve was not assessed.  Aortic valve regurgitation is not visualized. No aortic stenosis is present.  7. The inferior vena cava is normal in size with greater than 50% respiratory variability, suggesting right atrial pressure of 3 mmHg. FINDINGS  Left Ventricle: Left ventricular ejection fraction, by estimation, is 60 to 65%. The left ventricle has normal function. The left ventricle has no regional wall motion abnormalities. The left ventricular internal cavity size was normal in size. There is  no left ventricular hypertrophy. Left ventricular diastolic function could not be evaluated. Right Ventricle: The right ventricular size is normal. No increase in right ventricular wall thickness. Right ventricular systolic function is normal. Left Atrium: Left atrial size was moderately dilated. Right Atrium: Right atrial size was moderately dilated. Pericardium: There is no evidence of pericardial effusion. Mitral Valve: The mitral valve was not assessed. No evidence of mitral valve regurgitation. No evidence of mitral valve stenosis. Tricuspid Valve: The tricuspid valve is not assessed. Tricuspid valve regurgitation is not demonstrated. No evidence of tricuspid stenosis. Aortic Valve: The aortic valve was not assessed. Aortic valve regurgitation is not visualized. No aortic stenosis is present. Pulmonic Valve: The pulmonic valve was not assessed. Pulmonic valve regurgitation is not visualized. No evidence of pulmonic stenosis. Aorta: The aortic root is normal in size and structure. Venous: The inferior vena cava is normal in size with greater than 50% respiratory variability, suggesting right atrial pressure of 3 mmHg. IAS/Shunts: No atrial level shunt detected by color flow Doppler.  LEFT VENTRICLE PLAX 2D LVIDd:         3.71 cm LVIDs:         3.52 cm LV PW:         1.04 cm LV IVS:        0.89 cm LVOT diam:     2.00 cm LVOT Area:     3.14 cm  LEFT ATRIUM         Index LA diam:    3.30 cm 1.63 cm/m   AORTA Ao Root diam: 1.90 cm  SHUNTS  Systemic Diam: 2.00 cm Neoma Laming MD Electronically signed by Neoma Laming MD Signature Date/Time: 01/24/2020/1:21:14 PM    Final         Scheduled Meds: . escitalopram  5 mg Oral QHS  . nystatin   Topical BID  . rOPINIRole  1 mg Oral TID  . senna  2 tablet Oral QHS   Continuous Infusions: . sodium chloride Stopped (01/24/20 1524)     LOS: 2 days    Time spent: 30 mins     Wyvonnia Dusky, MD [facility] Pager [phone]  If 7PM-7AM, please contact night-coverage 01/26/2020, 7:45 AM

## 2020-01-26 NOTE — TOC Progression Note (Signed)
Transition of Care [facility]) - Progression Note    Patient Details  Name: NAELA NODAL MRN: [id number] Date of Birth: 06-23-1928  [facility]) CM/SW [place], LCSW Phone Number: 01/26/2020, 2:46 PM  Clinical Narrative:    CSW received request to complete hospice home referral as this patient has experienced rapid decline in a short period of time. CSW spoke with Jackelyn Poling with hospice home to complete referral and faxed over clinical packet to [phone]. Checked on bed availability, none expected today.        Expected Discharge Plan and Services                                                 Social Determinants of Health (SDOH) Interventions    Readmission Risk Interventions No flowsheet data found.

## 2020-01-27 DIAGNOSIS — R627 Adult failure to thrive: Secondary | ICD-10-CM | POA: Diagnosis not present

## 2020-01-27 DIAGNOSIS — G20 Parkinson's disease: Secondary | ICD-10-CM | POA: Diagnosis not present

## 2020-01-27 DIAGNOSIS — R6510 Systemic inflammatory response syndrome (SIRS) of non-infectious origin without acute organ dysfunction: Secondary | ICD-10-CM | POA: Diagnosis not present

## 2020-01-27 DIAGNOSIS — F0281 Dementia in other diseases classified elsewhere with behavioral disturbance: Secondary | ICD-10-CM | POA: Diagnosis not present

## 2020-01-27 LAB — RESP PANEL BY RT PCR (RSV, FLU A&B, COVID)
Influenza A by PCR: NEGATIVE
Influenza B by PCR: NEGATIVE
Respiratory Syncytial Virus by PCR: NEGATIVE
SARS Coronavirus 2 by RT PCR: NEGATIVE

## 2020-01-27 MED ORDER — LORAZEPAM 2 MG/ML IJ SOLN: 1.0000 mg | INTRAMUSCULAR | 0 refills | Status: AC | PRN

## 2020-01-27 MED ORDER — LORAZEPAM 2 MG/ML PO CONC: 1.0000 mg | ORAL | 0 refills | Status: AC | PRN

## 2020-01-27 MED ORDER — MORPHINE SULFATE (PF) 2 MG/ML IV SOLN: 1.0000 mg | INTRAVENOUS | 0 refills | Status: AC | PRN

## 2020-01-27 MED ORDER — GLYCOPYRROLATE 0.2 MG/ML IJ SOLN: 0.2000 mg | INTRAMUSCULAR | Status: AC | PRN

## 2020-01-27 NOTE — Discharge Summary (Signed)
Physician Discharge Summary  Amanda Hogan ZOX:[id number] DOB: March 30, 1928 DOA: 01/23/2020  PCP: Amanda Hogan, [facility]  Admit date: 01/23/2020 Discharge date: 01/27/2020  Admitted From: SNF Disposition:  Hospice facility   Recommendations for Outpatient Follow-up:  1. Follow up with hospice provider ASAP   Home Health: no  Equipment/Devices:  Discharge Condition: hospice CODE STATUS: DNR/DNI  Diet recommendation: as tolerated  Brief/Interim Summary: HPI was taken from Dr. Tobie Hogan: Amanda Hogan is a 86 y.o. female with medical history significant for history of A. fib with RVR, status post 2 falls on day presentation at [facility], baseline dementia, hypertension, hyperlipidemia, anxiety/depression, CAD, grade 2 diastolic dysfunction, presented to the emergency department via Amanda Hogan staff for chief concern of altered mental status.  Unable to obtain history from patient as she was nonverbal.  I called daughter, Ms. Amanda Hogan, and daughter states that she has not seen the patient in person in a long time due to Covid pandemic and visitation restrictions in place at [place].  Patient states that she calls her mother once a week.  And the last time that she spoke to her mother was last week and the patient notes that her daughter is calling and that she says that she is 'at that place'.  At bedside, patient can somewhat follow commands, she does not tell me her name. She opens eyes spontaneously.  She moves spontaneously.   H&P was minimally obtained due to patient mental status.  Hospital Course from Dr. Lenise Hogan 11/5-11/8/21: Pt presented w/ SIRS and no source of sepsis was found. Pt was initially started on IV abxs but were subsquently d/c after the pt's family decided to proceed with comfort care only. Blood cx NGTD. CXR was neg for pneumonia. CT brain was neg for any acute intracranial findings. Pt likely had progressive end stage dementia. Hospice was  consulted and pt was d/c to a hospice facility. For more information please see other consult/progress notes.   Discharge Diagnoses:  Active Problems:   Sepsis ([place])   SIRS (systemic inflammatory response syndrome) (HCC)  Failure to thrive: secondary to all below. Proceed w/ comfort care only. Will consult hospice on 01/27/20 as they do not see pts here over the weekend  SIRS: no source of infection at this time. Sepsis r/o. HR >100, RR>100, elevated lactic acid on admission day. Blood cxs NGTD. [place] abd shows s/p cholecystectomy, hepatic steatosis, mild ascites & left renal atrophy. D/c IV abxs as pt was made comfort care only   AMS: etiology unclear,likely secondary to progressive dementia. Hx of TIA. CT head shows no acute intracranial abnormalities but old infarcts & atrophy present.   A. Fib: likely PAF. Not on anticoagulation secondary to high fall & bleeding risk.  Likely AKI on CKD: baseline Cr is unknown but around 1.1 recently as per EMR review. Cr is trending up from day prior   Hypertension: will d/c all anti-HTN meds  Hyperlipidemia: d/c statin   Chronic diastolic CHF: will d/c lasix   Bilateral lower extremity pain: appears chronic.  Bilateral inguinal rash: present on admission. Continue w/ nystatin powder.    Dementia: continue on home dose of memantine, quetiapine   Depression: severity unknown. Continue on home dose of escitalpram  Anxiety: severity unknown. Continue on ativan prn   Restless leg: continue on home dose of ropinirole   Discharge Instructions  Discharge Instructions    Diet general   Complete by: As directed    As tolerated  Increase activity slowly   Complete by: As directed      Allergies as of 01/27/2020      Reactions   Iohexol Shortness Of Breath, Itching   Codeine Sulfate Other (See Comments)   Unknown reaction.   Contrast Media [iodinated Diagnostic Agents] Other (See Comments)   Reaction:  Unknown       Medication  List    STOP taking these medications   acetaminophen 500 MG tablet Commonly known as: TYLENOL   amLODipine 2.5 MG tablet Commonly known as: NORVASC   Aspirin Childrens 81 MG chewable tablet Generic drug: aspirin   atorvastatin 40 MG tablet Commonly known as: LIPITOR   calcium carbonate 500 MG chewable tablet Commonly known as: TUMS - dosed in mg elemental calcium   clopidogrel 75 MG tablet Commonly known as: PLAVIX   cyanocobalamin 1000 MCG/ML injection Commonly known as: (VITAMIN B-12)   escitalopram 5 MG tablet Commonly known as: LEXAPRO   furosemide 20 MG tablet Commonly known as: LASIX   gabapentin 300 MG capsule Commonly known as: NEURONTIN   guaiFENesin 100 MG/5ML liquid Commonly known as: ROBITUSSIN   loperamide 2 MG capsule Commonly known as: IMODIUM   LORazepam 0.5 MG tablet Commonly known as: ATIVAN Replaced by: LORazepam 2 MG/ML concentrated solution   memantine 5 MG tablet Commonly known as: NAMENDA   methimazole 10 MG tablet Commonly known as: TAPAZOLE   metoprolol succinate 100 MG 24 hr tablet Commonly known as: TOPROL-XL   metoprolol succinate 25 MG 24 hr tablet Commonly known as: TOPROL-XL   Milk of Magnesia 400 MG/5ML suspension Generic drug: magnesium hydroxide   Mintox 528-413-24 MG/5ML suspension Generic drug: alum & mag hydroxide-simeth   neomycin-bacitracin-polymyxin ointment Commonly known as: NEOSPORIN   ondansetron 4 MG tablet Commonly known as: ZOFRAN   polyethylene glycol 17 g packet Commonly known as: MIRALAX / GLYCOLAX   QUEtiapine 25 MG tablet Commonly known as: SEROQUEL   rOPINIRole 1 MG tablet Commonly known as: REQUIP   senna 8.6 MG tablet Commonly known as: SENOKOT   spironolactone 25 MG tablet Commonly known as: ALDACTONE     TAKE these medications   glycopyrrolate 0.2 MG/ML injection Commonly known as: ROBINUL Inject 1 mL (0.2 mg total) into the skin every 4 (four) hours as needed (excessive  secretions).   glycopyrrolate 0.2 MG/ML injection Commonly known as: ROBINUL Inject 1 mL (0.2 mg total) into the vein every 4 (four) hours as needed (excessive secretions).   LORazepam 2 MG/ML concentrated solution Commonly known as: ATIVAN Place 0.5 mLs (1 mg total) under the tongue every 4 (four) hours as needed for anxiety. Replaces: LORazepam 0.5 MG tablet   LORazepam 2 MG/ML injection Commonly known as: ATIVAN Inject 0.5 mLs (1 mg total) into the vein every 4 (four) hours as needed for anxiety.   morphine 2 MG/ML injection Inject 0.5 mLs (1 mg total) into the vein every hour as needed.       Allergies  Allergen Reactions  . Iohexol Shortness Of Breath and Itching  . Codeine Sulfate Other (See Comments)    Unknown reaction.  . Contrast Media [Iodinated Diagnostic Agents] Other (See Comments)    Reaction:  Unknown     Consultations:  Hospice    Procedures/Studies: DG Lumbar Spine 2-3 Views  Result Date: 01/23/2020 CLINICAL DATA:  Low back pain after fall. EXAM: LUMBAR SPINE - 2-3 VIEW COMPARISON:  June 19, 2015. FINDINGS: Status post surgical posterior fusion of L4-5 with bilateral intrapedicular screw placement  and interbody fusion. Severe degenerative disc disease is noted at L1-2 and L2-3. No fracture is noted. IMPRESSION: Postsurgical and degenerative changes as described above. No acute abnormality seen in the lumbar spine. Aortic Atherosclerosis (ICD10-I70.0). Electronically Signed   By: Marijo Conception M.D.   On: 01/23/2020 11:26   CT Head Wo Contrast  Result Date: 01/23/2020 CLINICAL DATA:  Altered mental status. Fell twice today. EXAM: CT HEAD WITHOUT CONTRAST TECHNIQUE: Contiguous axial images were obtained from the base of the skull through the vertex without intravenous contrast. 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 No significant change in mild-to-moderate enlargement of the ventricles and subarachnoid  spaces. Stable marked patchy white matter low density in both cerebral hemispheres. No intracranial hemorrhage, mass lesion or CT evidence of acute infarction. Vascular: No hyperdense vessel or unexpected calcification. Skull: Choose 1 Sinuses/Orbits: Status post bilateral cataract extraction. Unremarkable bones and included paranasal sinuses. Other: None. IMPRESSION: 1. No acute abnormality. 2. 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 3. Stable mild-to-moderate diffuse cerebral and cerebellar atrophy. 4. Stable marked chronic small vessel white matter ischemic changes in both cerebral hemispheres. Electronically Signed   By: Claudie Revering M.D.   On: 01/23/2020 11:42   US Abdomen Complete  Result Date: 01/24/2020 CLINICAL DATA:  Abdominal pain. EXAM: ABDOMEN ULTRASOUND COMPLETE COMPARISON:  December 27, 2014.  October 16, 2017. FINDINGS: Gallbladder: Status post cholecystectomy. Common bile duct: Diameter: 4 mm which is within normal limits. Liver: No focal lesion identified. Increased echogenicity of hepatic parenchyma is noted suggesting hepatic steatosis. Portal vein is patent on color Doppler imaging with normal direction of blood flow towards the liver. IVC: No abnormality visualized. Pancreas: Not visualized due to overlying bowel gas. Spleen: Size and appearance within normal limits. Right Kidney: Length: 9.1 cm. Echogenicity within normal limits. No mass or hydronephrosis visualized. Left Kidney: Length: 8.6 cm. Echogenicity within normal limits. No mass or hydronephrosis visualized. Abdominal aorta: Not visualized due to overlying bowel gas. Other findings: Mild ascites is noted around the liver and spleen. IMPRESSION: Status post cholecystectomy. Mildly increased echogenicity of hepatic parenchyma is noted suggesting hepatic steatosis. Pancreas and abdominal aorta are not visualized due to overlying bowel gas. Mild ascites is noted around the liver and spleen. Left renal atrophy is noted.  Electronically Signed   By: Marijo Conception M.D.   On: 01/24/2020 08:08   DG Chest Port 1 View  Result Date: 01/23/2020 CLINICAL DATA:  Altered mental status. EXAM: PORTABLE CHEST 1 VIEW COMPARISON:  July 06, 2019. FINDINGS: Stable cardiomegaly. No pneumothorax is noted. Minimal right pleural effusion is noted. Left lung is clear. Minimal right basilar subsegmental atelectasis is noted. Bony thorax is unremarkable. IMPRESSION: Minimal right pleural effusion. Minimal right basilar subsegmental atelectasis. Electronically Signed   By: Marijo Conception M.D.   On: 01/23/2020 11:24   ECHOCARDIOGRAM COMPLETE  Result Date: 01/24/2020    ECHOCARDIOGRAM REPORT   Patient Name:   Amanda Hogan Date of Exam: 01/24/2020 Medical Rec #:  [id number]       Height:       66.0 in Accession #:    [id number]      Weight:       206.8 lb Date of Birth:  1928/09/21      BSA:          2.028 m Patient Age:    11 years        BP:  133/64 mmHg Patient Gender: F               HR:           105 bpm. Exam Location:  [facility] Procedure: 2D Echo, Cardiac Doppler and Color Doppler Indications:     Atrial fibrillation 427.31  History:         Patient has prior history of Echocardiogram examinations, most                  recent 10/28/2014. TIA and Stroke, Signs/Symptoms:Murmur; Risk                  Factors:Hypertension.  Sonographer:     Sherrie Sport RDCS (AE) Referring Phys:  [id number] AMY N COX Diagnosing Phys: Neoma Laming MD  Sonographer Comments: Technically difficult study due to poor echo windows, no subcostal window, no apical window and suboptimal parasternal window. Image acquisition challenging due to uncooperative patient. IMPRESSIONS  1. Left ventricular ejection fraction, by estimation, is 60 to 65%. The left ventricle has normal function. The left ventricle has no regional wall motion abnormalities. Left ventricular diastolic function could not be evaluated.  2. Right ventricular systolic function is normal. The right  ventricular size is normal.  3. Left atrial size was moderately dilated.  4. Right atrial size was moderately dilated.  5. The mitral valve was not assessed. No evidence of mitral valve regurgitation. No evidence of mitral stenosis.  6. The aortic valve was not assessed. Aortic valve regurgitation is not visualized. No aortic stenosis is present.  7. The inferior vena cava is normal in size with greater than 50% respiratory variability, suggesting right atrial pressure of 3 mmHg. FINDINGS  Left Ventricle: Left ventricular ejection fraction, by estimation, is 60 to 65%. The left ventricle has normal function. The left ventricle has no regional wall motion abnormalities. The left ventricular internal cavity size was normal in size. There is  no left ventricular hypertrophy. Left ventricular diastolic function could not be evaluated. Right Ventricle: The right ventricular size is normal. No increase in right ventricular wall thickness. Right ventricular systolic function is normal. Left Atrium: Left atrial size was moderately dilated. Right Atrium: Right atrial size was moderately dilated. Pericardium: There is no evidence of pericardial effusion. Mitral Valve: The mitral valve was not assessed. No evidence of mitral valve regurgitation. No evidence of mitral valve stenosis. Tricuspid Valve: The tricuspid valve is not assessed. Tricuspid valve regurgitation is not demonstrated. No evidence of tricuspid stenosis. Aortic Valve: The aortic valve was not assessed. Aortic valve regurgitation is not visualized. No aortic stenosis is present. Pulmonic Valve: The pulmonic valve was not assessed. Pulmonic valve regurgitation is not visualized. No evidence of pulmonic stenosis. Aorta: The aortic root is normal in size and structure. Venous: The inferior vena cava is normal in size with greater than 50% respiratory variability, suggesting right atrial pressure of 3 mmHg. IAS/Shunts: No atrial level shunt detected by color flow  Doppler.  LEFT VENTRICLE PLAX 2D LVIDd:         3.71 cm LVIDs:         3.52 cm LV PW:         1.04 cm LV IVS:        0.89 cm LVOT diam:     2.00 cm LVOT Area:     3.14 cm  LEFT ATRIUM         Index LA diam:    3.30 cm 1.63 cm/m   AORTA Ao  Root diam: 1.90 cm  SHUNTS Systemic Diam: 2.00 cm Neoma Laming MD Electronically signed by Neoma Laming MD Signature Date/Time: 01/24/2020/1:21:14 PM    Final    DG Hip Unilat W or Wo Pelvis 2-3 Views Left  Result Date: 01/23/2020 CLINICAL DATA:  Two falls, dementia, altered mental status, LEFT hip discomfort with palpation EXAM: DG HIP (WITH OR WITHOUT PELVIS) 2-3V LEFT COMPARISON:  01/08/2014 FINDINGS: Scattered motion artifact. Osseous demineralization. Prior lower lumbar fusion. Hip and SI joint spaces preserved. No acute fracture, dislocation, or bone destruction. Minimal spur formation at LEFT femoral head. IMPRESSION: Osseous demineralization with minimal degenerative changes of LEFT hip. No acute abnormalities. Electronically Signed   By: [name] Dana M.D.   On: 01/23/2020 10:43      Subjective: Pt is lethargic   Discharge Exam: Vitals:   01/26/20 1224 01/27/20 0811  BP: 122/60 133/81  Pulse: 100 (!) 43  Resp: 14 16  Temp: (!) 97.5 F (36.4 C) 98.4 F (36.9 C)  SpO2: 96% (!) 81%   Vitals:   01/25/20 2228 01/26/20 0031 01/26/20 1224 01/27/20 0811  BP: (!) 93/55 127/68 122/60 133/81  Pulse: 68 92 100 (!) 43  Resp: 18 18 14 16   Temp: (!) 97.5 F (36.4 C) 97.7 F (36.5 C) (!) 97.5 F (36.4 C) 98.4 F (36.9 C)  TempSrc: Oral Oral Oral   SpO2: 93% 94% 96% (!) 81%  Weight:      Height:        General: Pt is lethargic Cardiovascular:S1/S2 +, no rubs, no gallops Respiratory: diminished breath sounds b/l  Abdominal: Soft, NT, obese, bowel sounds + Extremities:  no cyanosis    The results of significant diagnostics from this hospitalization (including imaging, microbiology, ancillary and laboratory) are listed below for reference.      Microbiology: Recent Results (from the past 240 hour(s))  Culture, blood (single)     Status: None (Preliminary result)   Collection Time: 01/23/20  2:36 PM   Specimen: BLOOD  Result Value Ref Range Status   Specimen Description BLOOD RIGHT ANTECUBITAL  Final   Special Requests   Final    BOTTLES DRAWN AEROBIC AND ANAEROBIC Blood Culture adequate volume   Culture   Final    NO GROWTH 4 DAYS Performed at [facility], [address], [place], [place] [address]    Report Status PENDING  Incomplete  CULTURE, BLOOD (ROUTINE X 2) w Reflex to ID Panel     Status: None (Preliminary result)   Collection Time: 01/23/20  5:11 PM   Specimen: BLOOD  Result Value Ref Range Status   Specimen Description BLOOD BLOOD LEFT HAND  Final   Special Requests   Final    BOTTLES DRAWN AEROBIC AND ANAEROBIC Blood Culture results may not be optimal due to an inadequate volume of blood received in culture bottles   Culture   Final    NO GROWTH 4 DAYS Performed at [facility], [address] [place], [place] [address]    Report Status PENDING  Incomplete  CULTURE, BLOOD (ROUTINE X 2) w Reflex to ID Panel     Status: None (Preliminary result)   Collection Time: 01/23/20  5:29 PM   Specimen: BLOOD  Result Value Ref Range Status   Specimen Description BLOOD LEFT ANTECUBITAL  Final   Special Requests   Final    BOTTLES DRAWN AEROBIC AND ANAEROBIC Blood Culture results may not be optimal due to an inadequate volume of blood received in  culture bottles   Culture   Final    NO GROWTH 4 DAYS Performed at [facility], [address] [place], [place] [address]    Report Status PENDING  Incomplete     Labs: BNP (last 3 results) Recent Labs    01/23/20 1728  BNP 6,767.2*   Basic Metabolic Panel: Recent Labs  Lab 01/23/20 0929 01/24/20 0537 01/25/20 0846  NA 136 135 137  K 5.0 5.6* 5.2*  CL 100 103 104  CO2 22 16* 19*  GLUCOSE 142* 117* 111*  BUN 71* 66* 75*   CREATININE 2.15* 2.13* 2.46*  CALCIUM 9.0 8.9 8.8*   Liver Function Tests: Recent Labs  Lab 01/23/20 0929  AST 46*  ALT 46*  ALKPHOS 98  BILITOT 2.8*  PROT 7.3  ALBUMIN 3.5   No results for input(s): LIPASE, AMYLASE in the last 168 hours. No results for input(s): AMMONIA in the last 168 hours. CBC: Recent Labs  Lab 01/23/20 0929 01/24/20 0537 01/25/20 0846  WBC 9.0 10.1 10.4  HGB 11.8* 11.1* 11.2*  HCT 37.3 36.0 35.5*  MCV 97.9 99.4 98.1  PLT 226 204 193   Cardiac Enzymes: No results for input(s): CKTOTAL, CKMB, CKMBINDEX, TROPONINI in the last 168 hours. BNP: Invalid input(s): POCBNP CBG: Recent Labs  Lab 01/23/20 0944 01/23/20 2052  GLUCAP 136* 137*   D-Dimer No results for input(s): DDIMER in the last 72 hours. Hgb A1c No results for input(s): HGBA1C in the last 72 hours. Lipid Profile No results for input(s): CHOL, HDL, LDLCALC, TRIG, CHOLHDL, LDLDIRECT in the last 72 hours. Thyroid function studies No results for input(s): TSH, T4TOTAL, T3FREE, THYROIDAB in the last 72 hours.  Invalid input(s): FREET3 Anemia work up No results for input(s): VITAMINB12, FOLATE, FERRITIN, TIBC, IRON, RETICCTPCT in the last 72 hours. Urinalysis    Component Value Date/Time   COLORURINE AMBER (A) 01/23/2020 1229   APPEARANCEUR CLOUDY (A) 01/23/2020 1229   APPEARANCEUR Cloudy 11/30/2012 1020   LABSPEC 1.023 01/23/2020 1229   LABSPEC 1.020 11/30/2012 1020   PHURINE 5.0 01/23/2020 1229   GLUCOSEU NEGATIVE 01/23/2020 1229   GLUCOSEU Negative 11/30/2012 1020   HGBUR NEGATIVE 01/23/2020 1229   BILIRUBINUR NEGATIVE 01/23/2020 1229   BILIRUBINUR 1+ 11/30/2012 1020   KETONESUR NEGATIVE 01/23/2020 1229   PROTEINUR 100 (A) 01/23/2020 1229   UROBILINOGEN 0.2 10/27/2014 1451   NITRITE NEGATIVE 01/23/2020 1229   LEUKOCYTESUR NEGATIVE 01/23/2020 1229   LEUKOCYTESUR 2+ 11/30/2012 1020   Sepsis Labs Invalid input(s): PROCALCITONIN,  WBC,  LACTICIDVEN Microbiology Recent  Results (from the past 240 hour(s))  Culture, blood (single)     Status: None (Preliminary result)   Collection Time: 01/23/20  2:36 PM   Specimen: BLOOD  Result Value Ref Range Status   Specimen Description BLOOD RIGHT ANTECUBITAL  Final   Special Requests   Final    BOTTLES DRAWN AEROBIC AND ANAEROBIC Blood Culture adequate volume   Culture   Final    NO GROWTH 4 DAYS Performed at [facility], [address] [place], [place] [address]    Report Status PENDING  Incomplete  CULTURE, BLOOD (ROUTINE X 2) w Reflex to ID Panel     Status: None (Preliminary result)   Collection Time: 01/23/20  5:11 PM   Specimen: BLOOD  Result Value Ref Range Status   Specimen Description BLOOD BLOOD LEFT HAND  Final   Special Requests   Final    BOTTLES DRAWN AEROBIC AND ANAEROBIC Blood Culture results  may not be optimal due to an inadequate volume of blood received in culture bottles   Culture   Final    NO GROWTH 4 DAYS Performed at [facility], [address] [place], [place] [address]    Report Status PENDING  Incomplete  CULTURE, BLOOD (ROUTINE X 2) w Reflex to ID Panel     Status: None (Preliminary result)   Collection Time: 01/23/20  5:29 PM   Specimen: BLOOD  Result Value Ref Range Status   Specimen Description BLOOD LEFT ANTECUBITAL  Final   Special Requests   Final    BOTTLES DRAWN AEROBIC AND ANAEROBIC Blood Culture results may not be optimal due to an inadequate volume of blood received in culture bottles   Culture   Final    NO GROWTH 4 DAYS Performed at [facility], [address], [place], [place] [address]    Report Status PENDING  Incomplete     Time coordinating discharge: Over 30 minutes  SIGNED:   Wyvonnia Dusky, MD  [facility] 01/27/2020, 12:36 PM Pager   If 7PM-7AM, please contact night-coverage

## 2020-01-27 NOTE — Care Management Important Message (Signed)
Important Message  Patient Details  Name: Amanda Hogan MRN: [id number] Date of Birth: 12/23/1928   Medicare Important Message Given:  Yes     [name] [name] 01/27/2020, 12:56 PM

## 2020-01-27 NOTE — TOC Transition Note (Signed)
Transition of Care [facility]) - CM/SW Discharge Note   Patient Details  Name: Amanda Hogan MRN: [id number] Date of Birth: Oct 21, 1928  [facility] CM/SW Contact:  Kerin Salen, RN Phone Number: 01/27/2020, 12:47 PM   Clinical Narrative:   Patient to transfer care to Hospice Home via [facility] transport at 5:30pm today, 01/27/2020. No further TOC needs at this time.    Final next level of care: [place] Barriers to Discharge: Barriers Resolved   Patient Goals and CMS Choice        Discharge Placement                       Discharge Plan and Services                                     Social Determinants of Health (SDOH) Interventions     Readmission Risk Interventions No flowsheet data found.

## 2020-01-27 NOTE — Progress Notes (Addendum)
[facility]) Hospital Liaison RN note:  Received request from Dr. Eppie Gibson for family interest in the Hospice Home. Chart reviewed and eligibility has been approved. Spoke with daughter, Onalee Hua and son, Timmothy Sours in the room to confirm interest, initiate education related to hospice philosphy and explain services. Family verbalized understanding of information given and all questions were answered. Registration paperwork to be completed by family at 2 pm today. Plan for EMS transport at 5:30pm.Hospital care team has been updated.  I will fax the discharge summary. Please send signed and completed DNR with patient.  Please call with any hospice related questions or concerns.  Thank you for the opportunity to participate in this patient's care.  Zandra Abts, RN [facility] Liaison [phone]

## 2020-01-28 LAB — CULTURE, BLOOD (SINGLE)
Culture: NO GROWTH
Special Requests: ADEQUATE

## 2020-01-28 LAB — CULTURE, BLOOD (ROUTINE X 2)
Culture: NO GROWTH
Culture: NO GROWTH

## 2020-02-19 DEATH — deceased

## 2022-02-26 IMAGING — CR DG CHEST 2V
2 series · 2 of 2 positions shown · non-contrast
Comparison: 05/26/2017

CLINICAL DATA: Burning sensation, gastroesophageal reflux, chest
pain

EXAM:
CHEST - 2 VIEW

[chest pa]
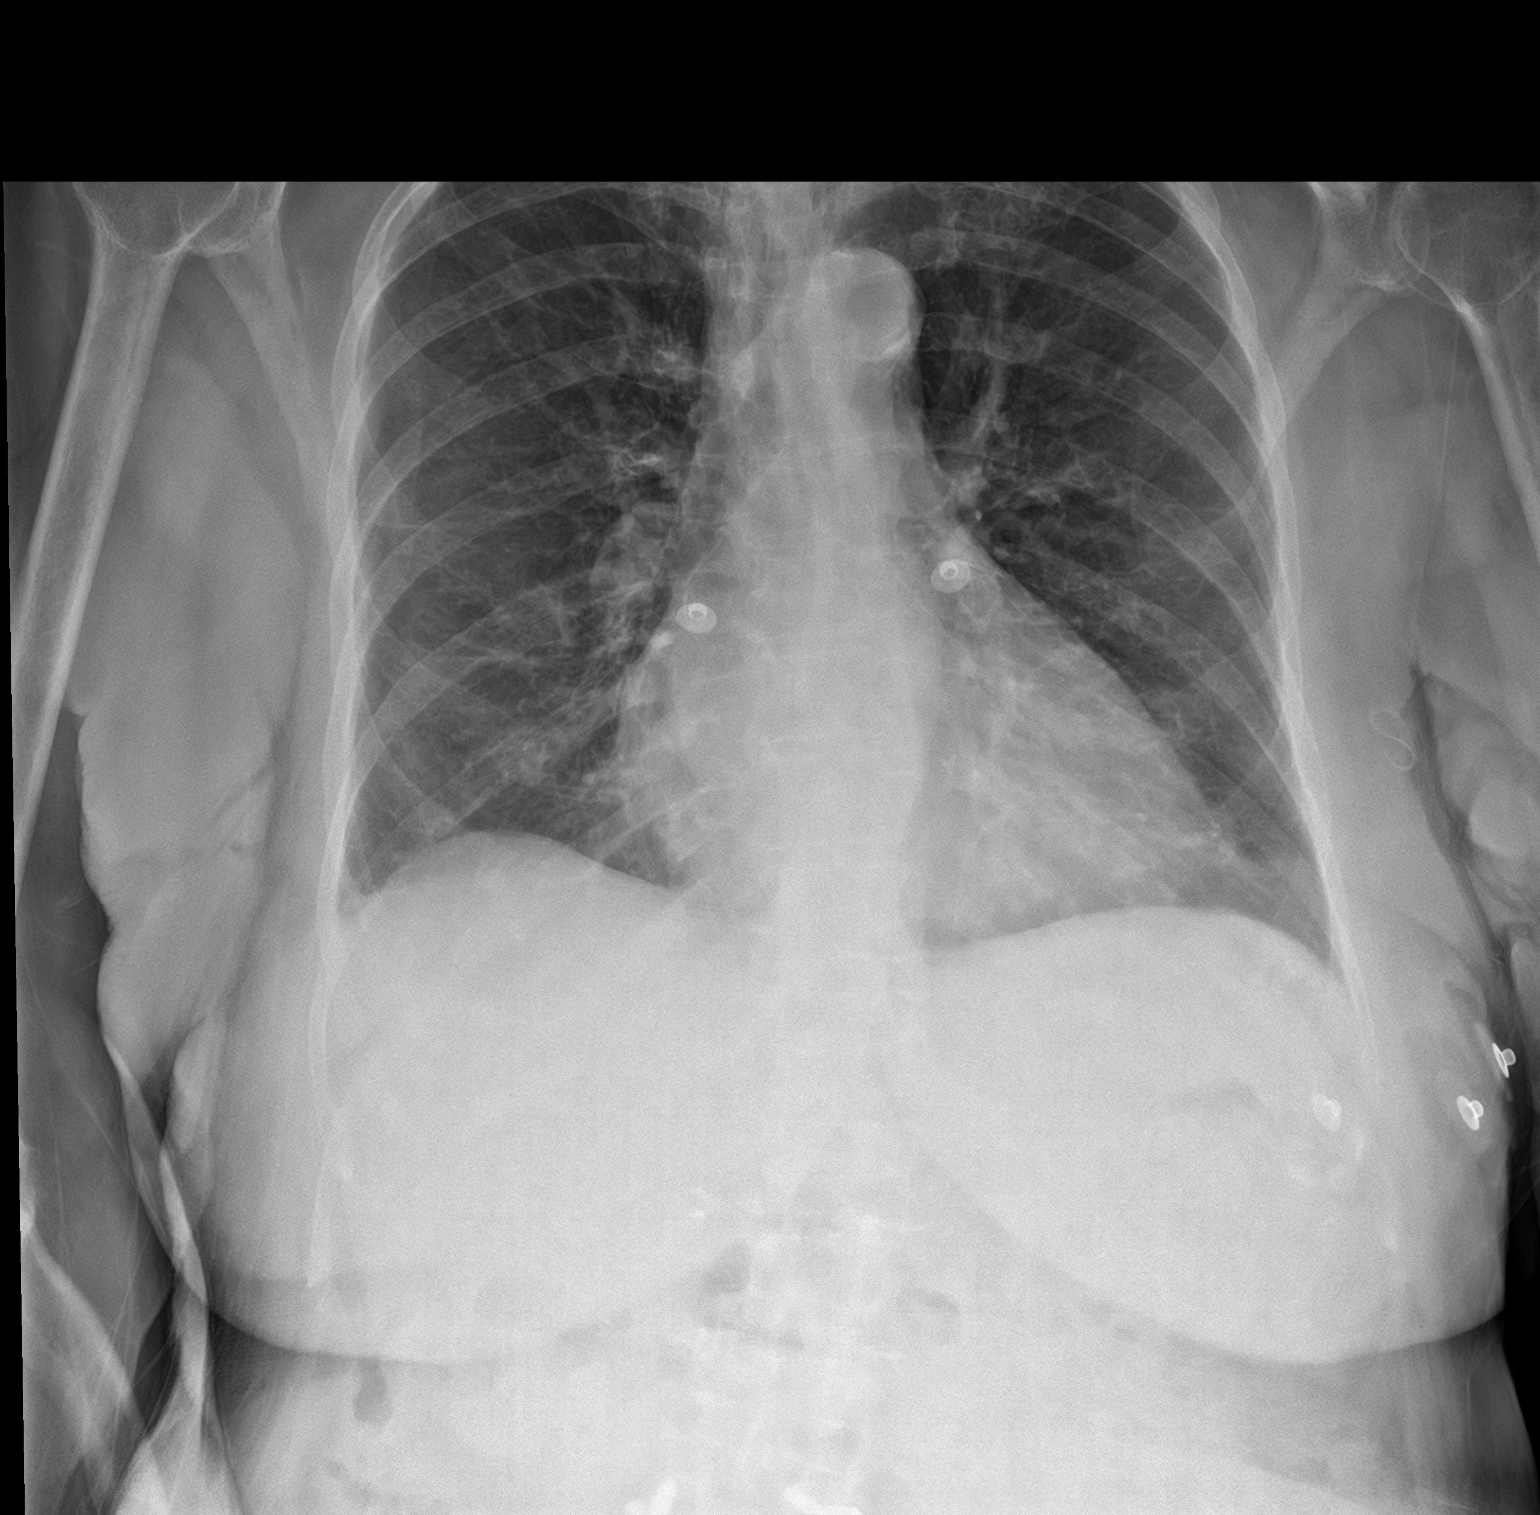

[chest lat]
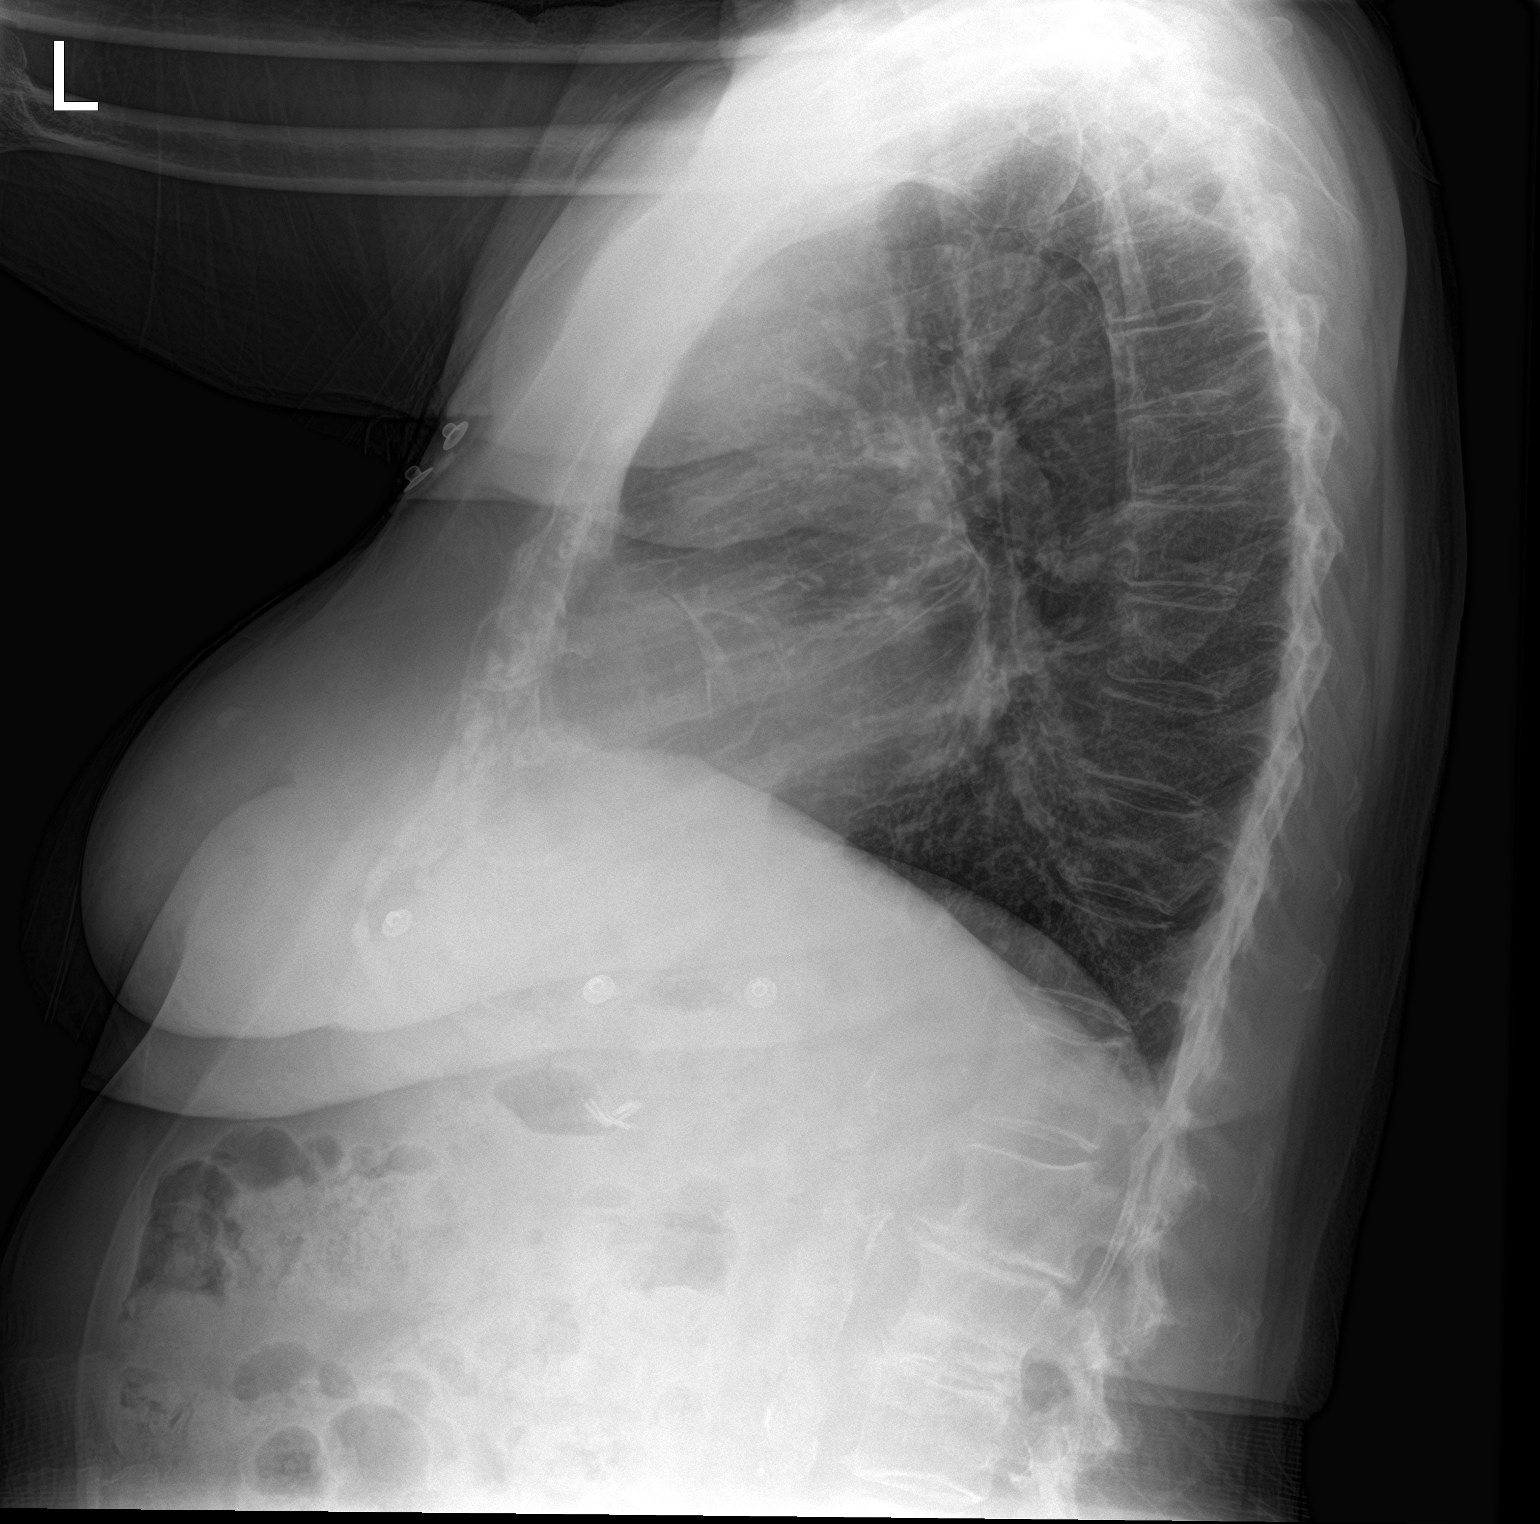

[2 of 2 positions shown; findings below may reference images not displayed]

FINDINGS: Frontal and lateral views of the chest demonstrate a stable cardiac
silhouette. Chronic background scarring without airspace disease,
effusion, or pneumothorax. No acute bony abnormality.
IMPRESSION: 1. No acute intrathoracic process.
